# Patient Record
Sex: Female | Born: 1970 | Race: White | Hispanic: No | Marital: Married | Smoking: Never smoker
Health system: Southern US, Community
[De-identification: ages and names within clinical notes are randomized; demographics above are authoritative.]

## PROBLEM LIST (undated history)

## (undated) DIAGNOSIS — F32A Depression, unspecified: Secondary | ICD-10-CM

## (undated) DIAGNOSIS — IMO0001 Reserved for inherently not codable concepts without codable children: Secondary | ICD-10-CM

## (undated) DIAGNOSIS — S92902A Unspecified fracture of left foot, initial encounter for closed fracture: Secondary | ICD-10-CM

## (undated) DIAGNOSIS — F329 Major depressive disorder, single episode, unspecified: Secondary | ICD-10-CM

## (undated) DIAGNOSIS — D219 Benign neoplasm of connective and other soft tissue, unspecified: Secondary | ICD-10-CM

## (undated) DIAGNOSIS — I82409 Acute embolism and thrombosis of unspecified deep veins of unspecified lower extremity: Secondary | ICD-10-CM

## (undated) DIAGNOSIS — Z531 Procedure and treatment not carried out because of patient's decision for reasons of belief and group pressure: Secondary | ICD-10-CM

## (undated) DIAGNOSIS — E041 Nontoxic single thyroid nodule: Secondary | ICD-10-CM

## (undated) DIAGNOSIS — R29898 Other symptoms and signs involving the musculoskeletal system: Secondary | ICD-10-CM

## (undated) DIAGNOSIS — C73 Malignant neoplasm of thyroid gland: Secondary | ICD-10-CM

## (undated) DIAGNOSIS — I739 Peripheral vascular disease, unspecified: Secondary | ICD-10-CM

## (undated) DIAGNOSIS — F419 Anxiety disorder, unspecified: Secondary | ICD-10-CM

## (undated) DIAGNOSIS — I10 Essential (primary) hypertension: Secondary | ICD-10-CM

## (undated) DIAGNOSIS — G43909 Migraine, unspecified, not intractable, without status migrainosus: Secondary | ICD-10-CM

## (undated) DIAGNOSIS — S66919A Strain of unspecified muscle, fascia and tendon at wrist and hand level, unspecified hand, initial encounter: Secondary | ICD-10-CM

## (undated) DIAGNOSIS — D649 Anemia, unspecified: Secondary | ICD-10-CM

## (undated) HISTORY — DX: Anxiety disorder, unspecified: F41.9

## (undated) HISTORY — DX: Anemia, unspecified: D64.9

## (undated) HISTORY — DX: Unspecified fracture of left foot, initial encounter for closed fracture: S92.902A

## (undated) HISTORY — DX: Malignant neoplasm of thyroid gland: C73

---

## 1999-01-16 DIAGNOSIS — I82409 Acute embolism and thrombosis of unspecified deep veins of unspecified lower extremity: Secondary | ICD-10-CM

## 1999-01-16 HISTORY — PX: TUBAL LIGATION: SHX77

## 1999-01-16 HISTORY — PX: THROMBECTOMY: PRO61

## 1999-01-16 HISTORY — DX: Acute embolism and thrombosis of unspecified deep veins of unspecified lower extremity: I82.409

## 1999-03-10 ENCOUNTER — Encounter: Payer: Self-pay | Admitting: *Deleted

## 1999-03-10 ENCOUNTER — Ambulatory Visit (HOSPITAL_COMMUNITY): Admission: RE | Admit: 1999-03-10 | Discharge: 1999-03-10 | Payer: Self-pay | Admitting: *Deleted

## 1999-07-16 ENCOUNTER — Encounter (INDEPENDENT_AMBULATORY_CARE_PROVIDER_SITE_OTHER): Payer: Self-pay

## 1999-07-16 ENCOUNTER — Inpatient Hospital Stay (HOSPITAL_COMMUNITY): Admission: AD | Admit: 1999-07-16 | Discharge: 1999-07-18 | Payer: Self-pay | Admitting: *Deleted

## 2000-06-05 ENCOUNTER — Encounter: Payer: Self-pay | Admitting: Vascular Surgery

## 2000-06-07 ENCOUNTER — Encounter (INDEPENDENT_AMBULATORY_CARE_PROVIDER_SITE_OTHER): Payer: Self-pay | Admitting: *Deleted

## 2000-06-07 ENCOUNTER — Ambulatory Visit (HOSPITAL_COMMUNITY): Admission: RE | Admit: 2000-06-07 | Discharge: 2000-06-07 | Payer: Self-pay | Admitting: Vascular Surgery

## 2002-08-28 ENCOUNTER — Other Ambulatory Visit: Admission: RE | Admit: 2002-08-28 | Discharge: 2002-08-28 | Payer: Self-pay | Admitting: Obstetrics & Gynecology

## 2008-07-11 ENCOUNTER — Emergency Department (HOSPITAL_BASED_OUTPATIENT_CLINIC_OR_DEPARTMENT_OTHER): Admission: EM | Admit: 2008-07-11 | Discharge: 2008-07-11 | Payer: Self-pay | Admitting: Emergency Medicine

## 2010-06-02 NOTE — Op Note (Signed)
[place]. [facility]  Patient:    Kara Hall, Kara Hall                       MRN: [id number] Proc. Date: 06/07/00 Attending:  Di Kindle. Edilia Bo, M.D.                           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  POSTOPERATIVE DIAGNOSIS:  Painful varicose veins of the right lower extremity with incompetence of the right greater saphenous vein.  OPERATION PERFORMED: 1. Ligation and stripping of right greater saphenous vein to the proximal    calf. 2. Segmental excision of varicose veins of the right leg.  SURGEON:  Di Kindle. Edilia Bo, M.D.  ASSISTANT:  Lissa Hoard, P.A.  ANESTHESIA:  General.  DESCRIPTION OF PROCEDURE:  The patient was taken to the operating room and received a general anesthetic.  The right lower extremity and right groin were prepped and draped in the usual sterile fashion.  Through an oblique incision in the right groin, the saphenofemoral junction was dissected free.  The vein at the saphenofemoral junction was ligated.  Small venotomy was made and the vein stripper was passed distally past the incompetent valves down to the proximal leg.  Here the vein was ligated distally and the catheter exited through the vein and then was secured with the 2-0 silk tie.  Next, attention was turned to segmental excision of the varicose veins which had been marked preoperatively.  Using small transverse incisions, the veins were excised bluntly and pressure held for hemostasis.  The one cluster appeared to communicate with the greater saphenous veins, so a second stripper was passed up to the midthigh where the vein here was ligated and both of these veins were then stripped down and pressure held for hemostasis.  The patient had been placed in Trendelenburg.  After hemostasis was obtained, the groin wound was closed with a deep  layer of 3-0 Vicryl and the skin closed with 4-0 Vicryl.  The other incisions were closed with interrupted 4-0 Vicryl sutures. Sterile dressing was applied.  The patient tolerated the procedure well and was transferred to the recovery room in satisfactory condition.  All needle and sponge counts were correct. DD:  06/07/00 TD:  06/08/00 Job: [id number]

## 2010-06-02 NOTE — Op Note (Signed)
[facility]  Patient:    Kara Hall, Kara Hall                     MRN: [id number] Proc. Date: 07/17/99 Adm. Date:  [id number] Attending:  Deniece Ree                           Operative Report  PREOPERATIVE DIAGNOSIS:       Multiparity, desirous of permanent                               sterilization. POSTOPERATIVE DIAGNOSIS:      Multiparity, desirous of permanent                               sterilization.  OPERATION:                    Bilateral tubal ligation using modified                               Pomeroy procedure.  SURGEON:                      Deniece Ree, M.D.  ASSISTANT:  ANESTHESIA:                   Epidural.  ESTIMATED BLOOD LOSS:         Less than 25 cc.  COMPLICATIONS:                None.  DISPOSITION:                  The patient tolerated the procedure well and returned to the recovery room in satisfactory condition.  INDICATIONS:  DESCRIPTION OF PROCEDURE:     The patient was taken to the operating room, prepped and draped in the usual fashion for a postpartum sterilization procedure.  A subumbilical incision was then made.  This was carried down through the fascia and into the abdominal cavity.  With some manipulation the right tube was identified, grasped with a Babcock clamp, followed out until the fimbriated end could be identified.  It was then knuckled up and utilizing 0 plain catgut ligated in a routine fashion without a problem.  This was done likewise on the left side.  Both segments of tube were labeled and sent to pathology.  Both tubal stump areas were then cauterized with the use of cautery. Sponge and needle count was correct times two and hemostasis was present.  The abdominal peritoneum and fascia was then closed using #1 chromic chromic in a running locking stitch followed by subcuticular stitch using 4-0 Vicryl.  The procedure was terminated, the patient tolerated this procedure very well  without any problems and was taken to the recovery room for observation. DD:  07/17/99 TD:  07/18/99 Job: [id number]

## 2011-01-16 DIAGNOSIS — R29898 Other symptoms and signs involving the musculoskeletal system: Secondary | ICD-10-CM

## 2011-01-16 HISTORY — DX: Other symptoms and signs involving the musculoskeletal system: R29.898

## 2013-01-12 ENCOUNTER — Ambulatory Visit: Payer: BC Managed Care – PPO | Admitting: Physician Assistant

## 2013-01-12 VITALS — BP 100/65 | HR 65 | Temp 98.2°F | Resp 18 | Ht 67.5 in | Wt 133.0 lb

## 2013-01-12 DIAGNOSIS — F419 Anxiety disorder, unspecified: Secondary | ICD-10-CM | POA: Insufficient documentation

## 2013-01-12 DIAGNOSIS — J029 Acute pharyngitis, unspecified: Secondary | ICD-10-CM

## 2013-01-12 MED ORDER — AMOXICILLIN 875 MG PO TABS: 875.0000 mg | ORAL_TABLET | Freq: Two times a day (BID) | ORAL | Status: DC

## 2013-01-12 NOTE — Progress Notes (Signed)
Subjective:    Patient ID: Kara Hall, female    DOB: [birth year], 42 y.o.   MRN: [id number]  PCP: No primary provider on file.  Chief Complaint  Patient presents with  . Sore Throat    x 1 week  . Otalgia    bilateral x 5 days   Medications, allergies, past medical history, surgical history, family history, social history and problem list reviewed and updated.  HPI  Has had a flu vaccine this season.  Had strep throat about 2 months ago.  She and her youngest son were both treated at [facility]. She reports that this feels the same.  She also notes that Azithromycin doesn't work as well for her as Amoxicillin and Augmentin.  Ears itch and hurt.  Aleve helps. Nose feels stuffy. Coughing, with phlegm getting stuck in her throat, but she can't cough hard enough to get the phlegm up due to the throat pain.   Subjective fever/chills. No unexplained muscle or joint pain.  Started her period the day after these symptoms began, so she isn't sure if the nausea and diarrhea she's had were worse than usual during her period.  Her son has similar symptoms.  Review of Systems As above.    Objective:   Physical Exam  Vitals reviewed. Constitutional: She is oriented to person, place, and time. Vital signs are normal. She appears well-developed and well-nourished. No distress.  HENT:  Head: Normocephalic and atraumatic.  Right Ear: Hearing, tympanic membrane, external ear and ear canal normal.  Left Ear: Hearing, tympanic membrane, external ear and ear canal normal.  Nose:  No foreign bodies. Right sinus exhibits no maxillary sinus tenderness and no frontal sinus tenderness. Left sinus exhibits no maxillary sinus tenderness and no frontal sinus tenderness.  Mouth/Throat: Uvula is midline, oropharynx is clear and moist and mucous membranes are normal. No uvula swelling. No oropharyngeal exudate.  Eyes: Conjunctivae and EOM are normal. Pupils are equal, round, and  reactive to light. Right eye exhibits no discharge. Left eye exhibits no discharge. No scleral icterus.  Neck: Trachea normal, normal range of motion and full passive range of motion without pain. Neck supple. No mass and no thyromegaly present.  Cardiovascular: Normal rate, regular rhythm and normal heart sounds.   Pulmonary/Chest: Effort normal and breath sounds normal.  Lymphadenopathy:       Head (right side): Tonsillar (tender) adenopathy present. No submandibular, no preauricular, no posterior auricular and no occipital adenopathy present.       Head (left side): Tonsillar (tender) adenopathy present. No submandibular, no preauricular and no occipital adenopathy present.    She has no cervical adenopathy.       Right: No supraclavicular adenopathy present.       Left: No supraclavicular adenopathy present.  Neurological: She is alert and oriented to person, place, and time. She has normal strength. No cranial nerve deficit or sensory deficit.  Skin: Skin is warm, dry and intact. No rash noted.  Psychiatric: She has a normal mood and affect. Her speech is normal and behavior is normal.   No rapid strep collected on her request, but throat culture swab collected and sent.       Assessment & Plan:  1. Acute pharyngitis Suspect viral URI, but cannot exclude Strep pharyngitis - amoxicillin (AMOXIL) 875 MG tablet; Take 1 tablet (875 mg total) by mouth 2 (two) times daily.  Dispense: 20 tablet; Refill: 0 - Culture, Group A Strep  Supportive care.  Anticipatory  guidance.  RTC if symptoms worsen/persist.   Fernande Bras, PA-C Physician Assistant-Certified [facility]

## 2013-01-12 NOTE — Patient Instructions (Signed)
Get plenty of rest and drink at least 64 ounces of water daily.  I will contact you with your lab results as soon as they are available.   If you have not heard from me in 2 weeks, please contact me.  The fastest way to get your results is to register for My Chart (see the instructions on the last page of this printout).   

## 2013-01-15 LAB — CULTURE, GROUP A STREP

## 2013-07-05 ENCOUNTER — Ambulatory Visit (INDEPENDENT_AMBULATORY_CARE_PROVIDER_SITE_OTHER): Payer: BC Managed Care – PPO | Admitting: Emergency Medicine

## 2013-07-05 VITALS — BP 98/70 | HR 78 | Temp 97.3°F | Resp 16 | Ht 66.75 in | Wt 133.0 lb

## 2013-07-05 DIAGNOSIS — J029 Acute pharyngitis, unspecified: Secondary | ICD-10-CM

## 2013-07-05 DIAGNOSIS — E041 Nontoxic single thyroid nodule: Secondary | ICD-10-CM

## 2013-07-05 DIAGNOSIS — J028 Acute pharyngitis due to other specified organisms: Secondary | ICD-10-CM

## 2013-07-05 LAB — POCT RAPID STREP A (OFFICE): Rapid Strep A Screen: NEGATIVE

## 2013-07-05 MED ORDER — AMOXICILLIN 875 MG PO TABS: 875.0000 mg | ORAL_TABLET | Freq: Two times a day (BID) | ORAL | Status: DC

## 2013-07-05 NOTE — Progress Notes (Signed)
Subjective:  This chart was scribed for Kara Queen, MD by Mercy Moore, Medial Scribe. This patient was seen in room 2 and the patient's care was started at 11:10 AM.    Patient ID: Kara Hall, female    DOB: 1970-06-16, 43 y.o.   MRN: [id number]  HPI HPI Comments: Kara Hall is a 43 y.o. female who presents to the [facility] complaining of sore throat, dry cough with associated ear pain, and fever ongoing for one week. Patient reports recurrent history of strep throat infections and feels that her current symptoms are similar. Patient feels that that her symptoms have been exacerbated by the air conditioning at work and working in front of fan placed by one of her coworkers. Patient has been taking ibuprofen 800mg  to manage her pain. Patient desires documentation of her visit for her supervisor.    Patient Active Problem List   Diagnosis Date Noted  . Anxiety 01/12/2013   Past Medical History  Diagnosis Date  . Anemia   . Anxiety    No past surgical history on file. Allergies  Allergen Reactions  . Sulfa Antibiotics    Prior to Admission medications   Medication Sig Start Date End Date Taking? Authorizing Provider  ALPRAZolam [name]) 0.25 MG tablet Take 0.5 mg by mouth at bedtime as needed for anxiety.   Yes Historical Provider, MD  amoxicillin (AMOXIL) 875 MG tablet Take 1 tablet (875 mg total) by mouth 2 (two) times daily. 01/12/13  Yes Chelle S Jeffery, PA-C  zolpidem (AMBIEN) 5 MG tablet Take 5 mg by mouth at bedtime as needed for sleep.   Yes Historical Provider, MD   History   Social History  . Marital Status: Married    Spouse Name: N/A    Number of Children: 3  . Years of Education: N/A   Occupational History  . [employer]   . Cosmetologist    Social History Main Topics  . Smoking status: Never Smoker   . Smokeless tobacco: Never Used  . Alcohol Use: No  . Drug Use: No  . Sexual Activity: Not on file   Other Topics  Concern  . Not on file   Social History Narrative   Lives with her husband and 3 children.       Review of Systems  Constitutional: Positive for fever.  HENT: Positive for ear pain and sore throat.   Respiratory: Positive for cough.   Musculoskeletal: Positive for myalgias.       Objective:   Physical Exam  CONSTITUTIONAL: Well developed/well nourished HEAD: Normocephalic/atraumatic EYES: EOMI/PERRL ENMT: Mucous membranes moist; redness on back of right throat  NECK: supple no meningeal signs; 1 and 1.5 cm right nodule on thyroid  SPINE:entire spine nontender CV: S1/S2 noted, no murmurs/rubs/gallops noted LUNGS: Lungs are clear to auscultation bilaterally, no apparent distress ABDOMEN: soft, nontender, no rebound or guarding GU:no cva tenderness NEURO: Pt is awake/alert, moves all extremitiesx4 EXTREMITIES: pulses normal, full ROM SKIN: warm, color normal PSYCH: no abnormalities of mood noted  Filed Vitals:   07/05/13 1040  BP: 98/70  Pulse: 78  Temp: 97.3 F (36.3 C)  Resp: 16   Results for orders placed in visit on 07/05/13  POCT RAPID STREP A (OFFICE)      Result Value Ref Range   Rapid Strep A Screen Negative  Negative      Assessment & Plan:  Patient has a history  Of strep. Throat culture was done.  Will cover with amoxicillin pending culture results I personally performed the services described in this documentation, which was scribed in my presence. The recorded information has been reviewed and is accurate.

## 2013-07-05 NOTE — Patient Instructions (Signed)

## 2013-07-06 ENCOUNTER — Ambulatory Visit
Admission: RE | Admit: 2013-07-06 | Discharge: 2013-07-06 | Disposition: A | Discharge status: Home / Self Care | Payer: BC Managed Care – PPO | Source: Ambulatory Visit | Attending: Emergency Medicine | Admitting: Emergency Medicine

## 2013-07-06 DIAGNOSIS — E041 Nontoxic single thyroid nodule: Secondary | ICD-10-CM

## 2013-07-07 ENCOUNTER — Other Ambulatory Visit (HOSPITAL_COMMUNITY)
Admission: RE | Admit: 2013-07-07 | Discharge: 2013-07-07 | Disposition: A | Discharge status: Home / Self Care | Payer: BC Managed Care – PPO | Source: Ambulatory Visit | Attending: Interventional Radiology | Admitting: Interventional Radiology

## 2013-07-07 ENCOUNTER — Ambulatory Visit
Admission: RE | Admit: 2013-07-07 | Discharge: 2013-07-07 | Disposition: A | Discharge status: Home / Self Care | Payer: BC Managed Care – PPO | Source: Ambulatory Visit | Attending: Emergency Medicine | Admitting: Emergency Medicine

## 2013-07-07 ENCOUNTER — Other Ambulatory Visit: Payer: Self-pay | Admitting: Radiology

## 2013-07-07 DIAGNOSIS — E041 Nontoxic single thyroid nodule: Secondary | ICD-10-CM | POA: Insufficient documentation

## 2013-07-07 LAB — CULTURE, GROUP A STREP: Organism ID, Bacteria: NORMAL

## 2013-07-09 ENCOUNTER — Other Ambulatory Visit: Payer: Self-pay | Admitting: Emergency Medicine

## 2013-07-09 DIAGNOSIS — D497 Neoplasm of unspecified behavior of endocrine glands and other parts of nervous system: Secondary | ICD-10-CM

## 2013-07-11 ENCOUNTER — Telehealth: Payer: Self-pay | Admitting: Family Medicine

## 2013-07-11 NOTE — Telephone Encounter (Signed)
Spoke with patient --- s/p thyroid ultrasound and biopsy.  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  Scheduled to work next week; referred to general surgery on 07/09/13.  Curious of when appointment will be with general surgery. Advised patient of appointment with general surgery 07/28/13 at 4:00pm.  To Dr. Lonia Mad.

## 2013-07-12 ENCOUNTER — Other Ambulatory Visit: Payer: Self-pay | Admitting: Emergency Medicine

## 2013-07-12 DIAGNOSIS — D497 Neoplasm of unspecified behavior of endocrine glands and other parts of nervous system: Secondary | ICD-10-CM

## 2013-07-12 NOTE — Telephone Encounter (Signed)
Thank you for taking this call Kara Hall. She called me again last night to discuss her situation. I think she is just afraid. She wants me to cancel her appointment with Gen. surgery and make it with ENT which I will do. Thank you again for

## 2013-07-28 ENCOUNTER — Ambulatory Visit (INDEPENDENT_AMBULATORY_CARE_PROVIDER_SITE_OTHER): Payer: Self-pay | Admitting: Surgery

## 2013-08-09 ENCOUNTER — Ambulatory Visit (INDEPENDENT_AMBULATORY_CARE_PROVIDER_SITE_OTHER): Payer: BC Managed Care – PPO | Admitting: Physician Assistant

## 2013-08-09 VITALS — BP 110/82 | HR 58 | Temp 97.7°F | Resp 15 | Ht 66.5 in | Wt 135.6 lb

## 2013-08-09 DIAGNOSIS — L739 Follicular disorder, unspecified: Secondary | ICD-10-CM

## 2013-08-09 DIAGNOSIS — L678 Other hair color and hair shaft abnormalities: Secondary | ICD-10-CM

## 2013-08-09 DIAGNOSIS — Z85850 Personal history of malignant neoplasm of thyroid: Secondary | ICD-10-CM | POA: Insufficient documentation

## 2013-08-09 DIAGNOSIS — G4700 Insomnia, unspecified: Secondary | ICD-10-CM

## 2013-08-09 DIAGNOSIS — L738 Other specified follicular disorders: Secondary | ICD-10-CM

## 2013-08-09 MED ORDER — DOXYCYCLINE HYCLATE 100 MG PO CAPS: 100.0000 mg | ORAL_CAPSULE | Freq: Two times a day (BID) | ORAL | Status: DC

## 2013-08-09 MED ORDER — ZOLPIDEM TARTRATE 5 MG PO TABS: 5.0000 mg | ORAL_TABLET | Freq: Every evening | ORAL | Status: DC | PRN

## 2013-08-09 MED ORDER — FLUCONAZOLE 150 MG PO TABS: 150.0000 mg | ORAL_TABLET | Freq: Once | ORAL | Status: DC

## 2013-08-09 NOTE — Patient Instructions (Signed)
Stay as cool and dry as you can.  Wear loose fitting clothing to prevent increased irritation.

## 2013-08-09 NOTE — Progress Notes (Signed)
   Subjective:    Patient ID: Kara Hall, female    DOB: June 29, 1970, 43 y.o.   MRN: [id number]   PCP: Luetta Nutting, DO  Chief Complaint  Patient presents with  . Rash    since yesterday, possible stap, right inner thigh    Medications, allergies, past medical history, surgical history, family history, social history and problem list reviewed and updated.  Patient Active Problem List   Diagnosis Date Noted  . Thyroid nodule 08/09/2013  . Anxiety 01/12/2013    Prior to Admission medications   Medication Sig Start Date End Date Taking? Authorizing Provider  ALPRAZolam [name]) 0.25 MG tablet Take 0.5 mg by mouth at bedtime as needed for anxiety.   Yes Historical Provider, MD    HPI  Presents with small red bumps on the mons pubis.  She is concerned that she has "Staph" or MRSA due to her husband recently being treated for a staph skin infection.  No lesions anywhere else.  No pain, a little itchy.  No swollen nodes. No urinary symptoms.  She asks for a refill of Ambien to help her sleep.  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  Partial thyroidectomy is scheduled for 08/28/2013.  Review of Systems     Objective:   Physical Exam  Constitutional: She is oriented to person, place, and time. She appears well-developed and well-nourished. She is active and cooperative. No distress.  BP 110/82  Pulse 58  Temp(Src) 97.7 F (36.5 C) (Oral)  Resp 15  Ht 5' 6.5" (1.689 m)  Wt 135 lb 9.6 oz (61.508 kg)  BMI 21.56 kg/m2  SpO2 100%  LMP 08/06/2013 Daughter is present.  Eyes: Conjunctivae are normal. No scleral icterus.  Cardiovascular: Normal rate.   Pulmonary/Chest: Effort normal.  Neurological: She is alert and oriented to person, place, and time.  Skin: Skin is warm and dry. Rash noted. Rash is papular and pustular.  Papulopustular lesions scattered on the mons pubis.  Lesions are tiny.  No  lymphadenopathy. No induration.  Psychiatric: Her speech is normal and behavior is normal. Her mood appears anxious. Her affect is not inappropriate. She does not exhibit a depressed mood.          Assessment & Plan:  1. Folliculitis Supportive care. Anticipatory guidance. RTC if symptoms worsen/persist. - doxycycline (VIBRAMYCIN) 100 MG capsule; Take 1 capsule (100 mg total) by mouth 2 (two) times daily.  Dispense: 20 capsule; Refill: 0 - fluconazole (DIFLUCAN) 150 MG tablet; Take 1 tablet (150 mg total) by mouth once. Repeat if needed  Dispense: 2 tablet; Refill: 0  2. Insomnia - zolpidem (AMBIEN) 5 MG tablet; Take 1 tablet (5 mg total) by mouth at bedtime as needed for sleep.  Dispense: 30 tablet; Refill: 0   Fara Chute, PA-C Physician Assistant-Certified [facility]

## 2013-08-14 ENCOUNTER — Encounter (HOSPITAL_COMMUNITY): Payer: Self-pay | Admitting: Pharmacy Technician

## 2013-08-19 ENCOUNTER — Other Ambulatory Visit: Payer: Self-pay | Admitting: Otolaryngology

## 2013-08-21 ENCOUNTER — Encounter (HOSPITAL_COMMUNITY): Payer: Self-pay

## 2013-08-21 ENCOUNTER — Encounter (HOSPITAL_COMMUNITY)
Admission: RE | Admit: 2013-08-21 | Discharge: 2013-08-21 | Disposition: A | Discharge status: Home / Self Care | Payer: BC Managed Care – PPO | Source: Ambulatory Visit | Attending: Otolaryngology | Admitting: Otolaryngology

## 2013-08-21 ENCOUNTER — Ambulatory Visit (HOSPITAL_COMMUNITY)
Admission: RE | Admit: 2013-08-21 | Discharge: 2013-08-21 | Disposition: A | Discharge status: Home / Self Care | Payer: BC Managed Care – PPO | Source: Ambulatory Visit | Attending: Otolaryngology | Admitting: Otolaryngology

## 2013-08-21 DIAGNOSIS — Z01818 Encounter for other preprocedural examination: Secondary | ICD-10-CM | POA: Diagnosis present

## 2013-08-21 HISTORY — DX: Depression, unspecified: F32.A

## 2013-08-21 HISTORY — DX: Peripheral vascular disease, unspecified: I73.9

## 2013-08-21 HISTORY — DX: Other symptoms and signs involving the musculoskeletal system: R29.898

## 2013-08-21 HISTORY — DX: Major depressive disorder, single episode, unspecified: F32.9

## 2013-08-21 HISTORY — DX: Strain of unspecified muscle, fascia and tendon at wrist and hand level, unspecified hand, initial encounter: S66.919A

## 2013-08-21 LAB — COMPREHENSIVE METABOLIC PANEL
ALT: 10 U/L (ref 0–35)
AST: 15 U/L (ref 0–37)
Albumin: 3.7 g/dL (ref 3.5–5.2)
Alkaline Phosphatase: 50 U/L (ref 39–117)
Anion gap: 12 (ref 5–15)
BILIRUBIN TOTAL: 0.6 mg/dL (ref 0.3–1.2)
BUN: 13 mg/dL (ref 6–23)
CHLORIDE: 103 meq/L (ref 96–112)
CO2: 23 mEq/L (ref 19–32)
Calcium: 8.3 mg/dL — ABNORMAL LOW (ref 8.4–10.5)
Creatinine, Ser: 0.52 mg/dL (ref 0.50–1.10)
GFR calc Af Amer: >90 mL/min (ref >90)
GFR calc non Af Amer: >90 mL/min (ref >90)
Glucose, Bld: 82 mg/dL (ref 70–99)
POTASSIUM: 3.4 meq/L — AB (ref 3.7–5.3)
Sodium: 138 mEq/L (ref 137–147)
Total Protein: 6.9 g/dL (ref 6.0–8.3)

## 2013-08-21 LAB — HCG, SERUM, QUALITATIVE: PREG SERUM: NEGATIVE

## 2013-08-21 LAB — CBC
HCT: 35.5 % — ABNORMAL LOW (ref 36.0–46.0)
Hemoglobin: 11.4 g/dL — ABNORMAL LOW (ref 12.0–15.0)
MCH: 27.5 pg (ref 26.0–34.0)
MCHC: 32.1 g/dL (ref 30.0–36.0)
MCV: 85.7 fL (ref 78.0–100.0)
Platelets: 228 10*3/uL (ref 150–400)
RBC: 4.14 MIL/uL (ref 3.87–5.11)
RDW: 14.1 % (ref 11.5–15.5)
WBC: 5.9 10*3/uL (ref 4.0–10.5)

## 2013-08-21 LAB — NO BLOOD PRODUCTS

## 2013-08-21 NOTE — Pre-Procedure Instructions (Signed)
Kara Hall  08/21/2013   Your procedure is scheduled on:  08/28/2013  Report to [facility] Admitting  ENTRANCE  A  at    6:30A.M.  Call this number if you have problems the morning of surgery: [phone]   Remember:   Do not eat food or drink liquids after midnight.         on Thursday evening    Take these medicines the morning of surgery with A SIP OF WATER:   XANAX   Do not wear jewelry, make-up or nail polish.   Do not wear lotions, powders, or perfumes. You may wear deodorant.   Do not shave 48 hours prior to surgery.    Do not bring valuables to the hospital.  [facility] is not responsible  for any belongings or valuables.               Contacts, dentures or bridgework may not be worn into surgery.   Leave suitcase in the car. After surgery it may be brought to your room.   For patients admitted to the hospital, discharge time is determined by your                treatment team.               Patients discharged the day of surgery will not be allowed to drive  home.  Name and phone number of your driver: /w spouse   Special Instructions: Special Instructions: [place] - Preparing for Surgery  Before surgery, you can play an important role.  Because skin is not sterile, your skin needs to be as free of germs as possible.  You can reduce the number of germs on you skin by washing with CHG (chlorahexidine gluconate) soap before surgery.  CHG is an antiseptic cleaner which kills germs and bonds with the skin to continue killing germs even after washing.  Please DO NOT use if you have an allergy to CHG or antibacterial soaps.  If your skin becomes reddened/irritated stop using the CHG and inform your nurse when you arrive at Short Stay.  Do not shave (including legs and underarms) for at least 48 hours prior to the first CHG shower.  You may shave your face.  Please follow these instructions carefully:   1.  Shower with CHG Soap the night before surgery  and the  morning of Surgery.  2.  If you choose to wash your hair, wash your hair first as usual with your  normal shampoo.  3.  After you shampoo, rinse your hair and body thoroughly to remove the  Shampoo.  4.  Use CHG as you would any other liquid soap.  You can apply chg directly to the skin and wash gently with scrungie or a clean washcloth.  5.  Apply the CHG Soap to your body ONLY FROM THE NECK DOWN.    Do not use on open wounds or open sores.  Avoid contact with your eyes, ears, mouth and genitals (private parts).  Wash genitals (private parts)   with your normal soap.  6.  Wash thoroughly, paying special attention to the area where your surgery will be performed.  7.  Thoroughly rinse your body with warm water from the neck down.  8.  DO NOT shower/wash with your normal soap after using and rinsing off   the CHG Soap.  9.  Pat yourself dry with a clean towel.  10.  Wear clean pajamas.            11.  Place clean sheets on your bed the night of your first shower and do not sleep with pets.  Day of Surgery  Do not apply any lotions/deodorants the morning of surgery.  Please wear clean clothes to the hospital/surgery center.   Please read over the following fact sheets that you were given: Pain Booklet, Coughing and Deep Breathing and Surgical Site Infection Prevention

## 2013-08-27 NOTE — H&P (Signed)
Mysty, Kielty 43 y.o., female [id number]     Chief Complaint: RIGHT thyroid mass  HPI: Patient is an anxious 43 year old female who presents for thyroid nodules. About two weeks ago she saw Dr. Everlene Farrier for sore throat. She took a round of Amoxicillin to cover for possible strep and the throat is feeling better. On exam a 1-1.5cm right thyroid nodule was found. Patient was not aware of a nodule in her thyroid gland. She states her mother has multinodulear goiter. Ultrasound of thyroid on 6/22 which showed the right lobe with several nodules including a 47mm cyst in the superior pole, 15x8x66mm solid nodule in the mid lobe and an adjacent 11x6x63mm hypoechoic nodule and a poorly marginated 69mm nodule, inferior pole. Left lobe with 22mm complex cystic lesion in the mid lobe and 68mm hypoechoic solid nodule in the inferior pole (per report). On 6/23 she had an U/S-guided FNA of the dominant right nodule (four passes).  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 Follicular epithelium in a predominantly microfollicular pattern (per report).  Previous surgeries include DVT on her right lower leg requiring surgery; she can barely tell there is a scar there. No history of keloid formation.  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 She is a non-smoker.  One month recheck.  She remains quite anxious.  We went through the decision tree regarding partial versus total thyroidectomy and possible postoperative radioactive iodine.  If she has a total thyroidectomy, she will need thyroid replacement, of course.   I discussed the surgery in detail including risks and complications.  Questions were answered and informed consent was obtained.  I discussed advancement of diet and activity including return to work.  Because her work in the [employer] store is quite physically strenuous, she thinks she may need more than 2 weeks off.  I suspect she will be ready to return around 2 weeks  but we will be seeing her before that and will discuss this.   Postoperative prescriptions for tramadol, hydrocodone, and Synthroid written and given  PMH: Past Medical History  Diagnosis Date  . Anemia   . Thyroid disease     nodule surgery scheduled for 08/28/2013  . Peripheral vascular disease     DVT- per pt. in 2001- had vein removed.   . Anxiety     panic attack, also reports problems with depression, relative to her job & upcoming surgery , relative to life problems - with children & spouse    . Headache(784.0)     h/o migraines - as a teenager  & again now with family issues, states she has to lay down & rest in a dark room   . Depression   . Muscle strain of hand     wearing home made splint today, L hand  . Back complaints 2013    has had steroid injection     Surg Hx: Past Surgical History  Procedure Laterality Date  . Tubal ligation  2001  . Vein surgery Right     for deep vein thrombosis    FHx:   Family History  Problem Relation Age of Onset  . Heart disease Mother   . Hyperlipidemia Mother   . Heart disease Father   . Hyperlipidemia Father   . Heart disease Brother   . Hyperlipidemia Brother   . Heart disease Brother   . Hyperlipidemia Brother    SocHx:  reports that she has never smoked.  She has never used smokeless tobacco. She reports that she does not drink alcohol or use illicit drugs.  ALLERGIES:  Allergies  Allergen Reactions  . Sulfa Antibiotics Other (See Comments)    Severe bladder, and kidney infection  . Trazodone And Nefazodone Other (See Comments)    Headache every am    No prescriptions prior to admission    No results found for this or any previous visit (from the past 48 hour(s)). No results found.  YCX:KGYJEHUD: Feeling tired (fatigue).  No fever, no night sweats, and no recent weight loss. Head: Headache. Eyes: No eye symptoms. Otolaryngeal: No hearing loss.  Earache.  No tinnitus  and no purulent nasal discharge.  No  nasal passage blockage (stuffiness), no snoring, and no sneezing.  Hoarseness  and sore throat. Cardiovascular: No chest pain or discomfort  and no palpitations. Pulmonary: No dyspnea.  Cough.  No wheezing. Gastrointestinal: No dysphagia  and no heartburn.  No nausea, no abdominal pain, and no melena.  No diarrhea. Genitourinary: No dysuria. Endocrine: No muscle weakness. Musculoskeletal: No calf muscle cramps, no arthralgias, and no soft tissue swelling. Neurological: Dizziness.  No fainting, no tingling, and no numbness. Psychological: Anxiety.  No depression. Skin: No rash.  BP:98/70,  Height: 5 ft 6 in, Weight: 135 lb , BMI: 21.8 kg/m2  PHYSICAL EXAM: She is thin and appears healthy.  She is quite anxious.  Mental status is basically intact.  Ears are clear.  Anterior nose is clear.  Oral cavity is moist with teeth in good repair.  Oropharynx clear.  Hypopharynx by mirror examination shows mobile vocal cords.  Neck without adenopathy.  Slight fullness in the RIGHT thyroid lobe.  Negative Chvostek's test on both sides.     Lungs: Clear to auscultation Heart: Regular rate and rhythm without murmur Abdomen: Soft, active Extremities: Normal configuration.  She has a brace on her LEFT hand. Neurologic: Symmetric, grossly intact.     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  We are going to take out half of her thyroid gland next week to make sure it does not have cancer in it.  If the pathologist can identify cancer at the time of surgery, we will also remove the second half of your thyroid gland.  In that case, he will need thyroid replacement medication, and later, radioactive iodine therapy.  We will get you set up with an endocrinologist if appropriate.   If the tumor is benign, then you do not need thyroid medication.  Either Tramadol, or Hydrocodone for pain relief.  Plain Ibuprofen would be fine too.  No strenuous activity x 2 weeks.  Recheck  here 10 days for suture removal.  TraMADol HCl - 50 MG Oral Tablet;1-2 po q4h prn pain; Qty40; R0; Rx. Hydrocodone-Acetaminophen 5-325 MG Oral Tablet;1-2 po q4-6h prn pain; Qty40; R0; Rx. Levothyroxine Sodium 125 MCG Oral Tablet;TAKE 1 TABLET DAILY; JSH70; R2; Rx  Jodi Marble 2/63/7858, 6:21 PM

## 2013-08-28 ENCOUNTER — Observation Stay (HOSPITAL_COMMUNITY)
Admission: RE | Admit: 2013-08-28 | Discharge: 2013-08-29 | Disposition: A | Discharge status: Home / Self Care | Payer: BC Managed Care – PPO | Source: Ambulatory Visit | Attending: Otolaryngology | Admitting: Otolaryngology

## 2013-08-28 ENCOUNTER — Encounter (HOSPITAL_COMMUNITY)
Admission: RE | Disposition: A | Discharge status: Home / Self Care | Payer: Self-pay | Source: Ambulatory Visit | Attending: Otolaryngology

## 2013-08-28 ENCOUNTER — Ambulatory Visit (HOSPITAL_COMMUNITY): Payer: BC Managed Care – PPO | Admitting: Anesthesiology

## 2013-08-28 ENCOUNTER — Encounter (HOSPITAL_COMMUNITY): Payer: Self-pay | Admitting: *Deleted

## 2013-08-28 ENCOUNTER — Encounter (HOSPITAL_COMMUNITY): Payer: BC Managed Care – PPO | Admitting: Anesthesiology

## 2013-08-28 DIAGNOSIS — Z882 Allergy status to sulfonamides status: Secondary | ICD-10-CM | POA: Insufficient documentation

## 2013-08-28 DIAGNOSIS — F3289 Other specified depressive episodes: Secondary | ICD-10-CM | POA: Diagnosis not present

## 2013-08-28 DIAGNOSIS — E041 Nontoxic single thyroid nodule: Secondary | ICD-10-CM

## 2013-08-28 DIAGNOSIS — Z86718 Personal history of other venous thrombosis and embolism: Secondary | ICD-10-CM | POA: Diagnosis not present

## 2013-08-28 DIAGNOSIS — Z888 Allergy status to other drugs, medicaments and biological substances status: Secondary | ICD-10-CM | POA: Insufficient documentation

## 2013-08-28 DIAGNOSIS — C73 Malignant neoplasm of thyroid gland: Secondary | ICD-10-CM | POA: Diagnosis not present

## 2013-08-28 DIAGNOSIS — F329 Major depressive disorder, single episode, unspecified: Secondary | ICD-10-CM | POA: Insufficient documentation

## 2013-08-28 DIAGNOSIS — G43909 Migraine, unspecified, not intractable, without status migrainosus: Secondary | ICD-10-CM | POA: Diagnosis not present

## 2013-08-28 DIAGNOSIS — F410 Panic disorder [episodic paroxysmal anxiety] without agoraphobia: Secondary | ICD-10-CM | POA: Insufficient documentation

## 2013-08-28 DIAGNOSIS — E0789 Other specified disorders of thyroid: Secondary | ICD-10-CM | POA: Diagnosis present

## 2013-08-28 HISTORY — DX: Nontoxic single thyroid nodule: E04.1

## 2013-08-28 HISTORY — DX: Procedure and treatment not carried out because of patient's decision for reasons of belief and group pressure: Z53.1

## 2013-08-28 HISTORY — PX: THYROIDECTOMY: SHX17

## 2013-08-28 HISTORY — DX: Reserved for inherently not codable concepts without codable children: IMO0001

## 2013-08-28 HISTORY — DX: Migraine, unspecified, not intractable, without status migrainosus: G43.909

## 2013-08-28 HISTORY — DX: Acute embolism and thrombosis of unspecified deep veins of unspecified lower extremity: I82.409

## 2013-08-28 LAB — CBC
HCT: 33.9 % — ABNORMAL LOW (ref 36.0–46.0)
HEMOGLOBIN: 10.7 g/dL — AB (ref 12.0–15.0)
MCH: 26.9 pg (ref 26.0–34.0)
MCHC: 31.6 g/dL (ref 30.0–36.0)
MCV: 85.2 fL (ref 78.0–100.0)
Platelets: 200 10*3/uL (ref 150–400)
RBC: 3.98 MIL/uL (ref 3.87–5.11)
RDW: 14 % (ref 11.5–15.5)
WBC: 10.3 10*3/uL (ref 4.0–10.5)

## 2013-08-28 LAB — CREATININE, SERUM
Creatinine, Ser: 0.53 mg/dL (ref 0.50–1.10)
GFR calc Af Amer: >90 mL/min (ref >90)
GFR calc non Af Amer: >90 mL/min (ref >90)

## 2013-08-28 SURGERY — THYROIDECTOMY
Anesthesia: General | Site: Neck | Laterality: Right

## 2013-08-28 MED ORDER — BACITRACIN ZINC 500 UNIT/GM EX OINT
TOPICAL_OINTMENT | CUTANEOUS | Status: AC
  Filled 2013-08-28: qty 15

## 2013-08-28 MED ORDER — ROCURONIUM BROMIDE 100 MG/10ML IV SOLN
INTRAVENOUS | Status: DC | PRN
  Administered 2013-08-28: 40 mg via INTRAVENOUS

## 2013-08-28 MED ORDER — LACTATED RINGERS IV SOLN
INTRAVENOUS | Status: DC
  Administered 2013-08-28: 07:00:00 via INTRAVENOUS

## 2013-08-28 MED ORDER — METOCLOPRAMIDE HCL 5 MG/ML IJ SOLN
INTRAMUSCULAR | Status: AC
  Filled 2013-08-28: qty 2

## 2013-08-28 MED ORDER — DIPHENHYDRAMINE HCL 50 MG/ML IJ SOLN
INTRAMUSCULAR | Status: AC
  Filled 2013-08-28: qty 1

## 2013-08-28 MED ORDER — LIDOCAINE-EPINEPHRINE 1 %-1:100000 IJ SOLN
INTRAMUSCULAR | Status: AC
  Filled 2013-08-28: qty 1

## 2013-08-28 MED ORDER — 0.9 % SODIUM CHLORIDE (POUR BTL) OPTIME
TOPICAL | Status: DC | PRN
  Administered 2013-08-28: 1000 mL

## 2013-08-28 MED ORDER — LIDOCAINE HCL (CARDIAC) 20 MG/ML IV SOLN
INTRAVENOUS | Status: AC
  Filled 2013-08-28: qty 10

## 2013-08-28 MED ORDER — DOXYCYCLINE HYCLATE 100 MG PO TABS
100.0000 mg | ORAL_TABLET | Freq: Two times a day (BID) | ORAL | Status: DC
  Filled 2013-08-28 (×3): qty 1

## 2013-08-28 MED ORDER — ONDANSETRON HCL 4 MG/2ML IJ SOLN
INTRAMUSCULAR | Status: DC | PRN
  Administered 2013-08-28: 4 mg via INTRAVENOUS

## 2013-08-28 MED ORDER — METOCLOPRAMIDE HCL 5 MG/ML IJ SOLN
INTRAMUSCULAR | Status: DC | PRN
  Administered 2013-08-28: 10 mg via INTRAVENOUS

## 2013-08-28 MED ORDER — CHLORHEXIDINE GLUCONATE 4 % EX LIQD
1.0000 "application " | Freq: Once | CUTANEOUS | Status: DC
  Filled 2013-08-28: qty 15

## 2013-08-28 MED ORDER — HYDROCODONE-ACETAMINOPHEN 5-325 MG PO TABS
1.0000 | ORAL_TABLET | ORAL | Status: DC | PRN
  Administered 2013-08-28 – 2013-08-29 (×4): 2 via ORAL
  Administered 2013-08-29: 1 via ORAL
  Filled 2013-08-28 (×4): qty 2
  Filled 2013-08-28: qty 1

## 2013-08-28 MED ORDER — FENTANYL CITRATE 0.05 MG/ML IJ SOLN
INTRAMUSCULAR | Status: AC
  Filled 2013-08-28: qty 2

## 2013-08-28 MED ORDER — EPHEDRINE SULFATE 50 MG/ML IJ SOLN
INTRAMUSCULAR | Status: DC | PRN
  Administered 2013-08-28 (×5): 5 mg via INTRAVENOUS

## 2013-08-28 MED ORDER — ZOLPIDEM TARTRATE 5 MG PO TABS: 5.0000 mg | ORAL_TABLET | Freq: Every evening | ORAL | Status: DC | PRN

## 2013-08-28 MED ORDER — PROMETHAZINE HCL 25 MG/ML IJ SOLN
6.2500 mg | INTRAMUSCULAR | Status: DC | PRN
Start: 2013-08-28 — End: 2013-08-28

## 2013-08-28 MED ORDER — DIPHENHYDRAMINE HCL 50 MG/ML IJ SOLN: 6.2500 mg | Freq: Once | INTRAMUSCULAR | Status: DC

## 2013-08-28 MED ORDER — LIDOCAINE-EPINEPHRINE 1 %-1:100000 IJ SOLN
INTRAMUSCULAR | Status: DC | PRN
  Administered 2013-08-28: 20 mL

## 2013-08-28 MED ORDER — DEXAMETHASONE SODIUM PHOSPHATE 10 MG/ML IJ SOLN
INTRAMUSCULAR | Status: AC
  Filled 2013-08-28: qty 1

## 2013-08-28 MED ORDER — DEXTROSE-NACL 5-0.45 % IV SOLN
INTRAVENOUS | Status: DC
Start: 2013-08-28 — End: 2013-08-29
  Administered 2013-08-28 – 2013-08-29 (×2): via INTRAVENOUS

## 2013-08-28 MED ORDER — NEOSTIGMINE METHYLSULFATE 10 MG/10ML IV SOLN
INTRAVENOUS | Status: DC | PRN
  Administered 2013-08-28: 3 mg via INTRAVENOUS

## 2013-08-28 MED ORDER — ALPRAZOLAM 0.5 MG PO TABS
0.5000 mg | ORAL_TABLET | Freq: Three times a day (TID) | ORAL | Status: DC | PRN
  Administered 2013-08-28 – 2013-08-29 (×2): 0.5 mg via ORAL
  Filled 2013-08-28 (×2): qty 1

## 2013-08-28 MED ORDER — GLYCOPYRROLATE 0.2 MG/ML IJ SOLN
INTRAMUSCULAR | Status: DC | PRN
  Administered 2013-08-28: 0.4 mg via INTRAVENOUS

## 2013-08-28 MED ORDER — PROPOFOL 10 MG/ML IV BOLUS
INTRAVENOUS | Status: DC | PRN
  Administered 2013-08-28: 150 mg via INTRAVENOUS

## 2013-08-28 MED ORDER — BACITRACIN ZINC 500 UNIT/GM EX OINT
1.0000 "application " | TOPICAL_OINTMENT | Freq: Three times a day (TID) | CUTANEOUS | Status: DC
  Administered 2013-08-28 – 2013-08-29 (×3): 1 via TOPICAL
  Filled 2013-08-28: qty 15
  Filled 2013-08-28: qty 28.35
  Filled 2013-08-28: qty 15

## 2013-08-28 MED ORDER — HEPARIN SODIUM (PORCINE) 5000 UNIT/ML IJ SOLN
5000.0000 [IU] | Freq: Three times a day (TID) | INTRAMUSCULAR | Status: DC
  Administered 2013-08-29: 5000 [IU] via SUBCUTANEOUS
  Filled 2013-08-28 (×4): qty 1

## 2013-08-28 MED ORDER — ONDANSETRON HCL 4 MG/2ML IJ SOLN
INTRAMUSCULAR | Status: AC
  Filled 2013-08-28: qty 2

## 2013-08-28 MED ORDER — ALPRAZOLAM 0.5 MG PO TABS: 0.5000 mg | ORAL_TABLET | Freq: Three times a day (TID) | ORAL | Status: DC | PRN

## 2013-08-28 MED ORDER — LIDOCAINE-EPINEPHRINE 2 %-1:100000 IJ SOLN
INTRAMUSCULAR | Status: AC
  Filled 2013-08-28: qty 1

## 2013-08-28 MED ORDER — ROCURONIUM BROMIDE 50 MG/5ML IV SOLN
INTRAVENOUS | Status: AC
  Filled 2013-08-28: qty 1

## 2013-08-28 MED ORDER — FENTANYL CITRATE 0.05 MG/ML IJ SOLN
INTRAMUSCULAR | Status: AC
  Filled 2013-08-28: qty 5

## 2013-08-28 MED ORDER — FENTANYL CITRATE 0.05 MG/ML IJ SOLN
INTRAMUSCULAR | Status: AC
Start: 2013-08-28 — End: 2013-08-28
  Administered 2013-08-28: 50 ug via INTRAVENOUS
  Filled 2013-08-28: qty 2

## 2013-08-28 MED ORDER — LIDOCAINE HCL (CARDIAC) 20 MG/ML IV SOLN
INTRAVENOUS | Status: DC | PRN
  Administered 2013-08-28: 40 mg via INTRAVENOUS

## 2013-08-28 MED ORDER — MEPERIDINE HCL 25 MG/ML IJ SOLN: 6.2500 mg | INTRAMUSCULAR | Status: DC | PRN

## 2013-08-28 MED ORDER — PROPOFOL 10 MG/ML IV BOLUS
INTRAVENOUS | Status: AC
  Filled 2013-08-28: qty 20

## 2013-08-28 MED ORDER — SCOPOLAMINE 1 MG/3DAYS TD PT72SCOPOLAMINE 1 MG/3DAYS
1.0000 | MEDICATED_PATCH | TRANSDERMAL | Status: DC
Start: 2013-08-28 — End: 2013-08-28

## 2013-08-28 MED ORDER — FENTANYL CITRATE 0.05 MG/ML IJ SOLN
25.0000 ug | INTRAMUSCULAR | Status: DC | PRN
  Administered 2013-08-28 (×3): 50 ug via INTRAVENOUS

## 2013-08-28 MED ORDER — DEXAMETHASONE SODIUM PHOSPHATE 10 MG/ML IJ SOLN
INTRAMUSCULAR | Status: DC | PRN
  Administered 2013-08-28: 10 mg via INTRAVENOUS

## 2013-08-28 MED ORDER — ONDANSETRON HCL 4 MG/2ML IJ SOLN
4.0000 mg | INTRAMUSCULAR | Status: DC | PRN
Start: 2013-08-28 — End: 2013-08-29
  Administered 2013-08-29: 4 mg via INTRAVENOUS
  Filled 2013-08-28: qty 2

## 2013-08-28 MED ORDER — ONDANSETRON HCL 4 MG PO TABS: 4.0000 mg | ORAL_TABLET | ORAL | Status: DC | PRN

## 2013-08-28 MED ORDER — LACTATED RINGERS IV SOLN
INTRAVENOUS | Status: DC | PRN
  Administered 2013-08-28 (×2): via INTRAVENOUS

## 2013-08-28 MED ORDER — GLYCOPYRROLATE 0.2 MG/ML IJ SOLN
INTRAMUSCULAR | Status: AC
  Filled 2013-08-28: qty 2

## 2013-08-28 MED ORDER — ALPRAZOLAM 0.5 MG PO TABS: 0.5000 mg | ORAL_TABLET | Freq: Every evening | ORAL | Status: DC | PRN

## 2013-08-28 MED ORDER — NEOSTIGMINE METHYLSULFATE 10 MG/10ML IV SOLN
INTRAVENOUS | Status: AC
  Filled 2013-08-28: qty 3

## 2013-08-28 MED ORDER — LIDOCAINE HCL 4 % MT SOLN
OROMUCOSAL | Status: DC | PRN
  Administered 2013-08-28: 4 mL via TOPICAL

## 2013-08-28 MED ORDER — MIDAZOLAM HCL 2 MG/2ML IJ SOLN
INTRAMUSCULAR | Status: AC
  Filled 2013-08-28: qty 2

## 2013-08-28 MED ORDER — MIDAZOLAM HCL 5 MG/5ML IJ SOLN
INTRAMUSCULAR | Status: DC | PRN
  Administered 2013-08-28: 2 mg via INTRAVENOUS

## 2013-08-28 MED ORDER — FENTANYL CITRATE 0.05 MG/ML IJ SOLN
INTRAMUSCULAR | Status: DC | PRN
  Administered 2013-08-28 (×6): 50 ug via INTRAVENOUS

## 2013-08-28 MED ORDER — DIPHENHYDRAMINE HCL 50 MG/ML IJ SOLN
INTRAMUSCULAR | Status: DC | PRN
  Administered 2013-08-28: 6.25 mg via INTRAVENOUS

## 2013-08-28 SURGICAL SUPPLY — 44 items
APPLIER CLIP 9.375 SM OPEN (CLIP) ×8
ATTRACTOMAT 16X20 MAGNETIC DRP (DRAPES) ×2 IMPLANT
BLADE SURG 10 STRL SS (BLADE) ×2 IMPLANT
BLADE SURG 15 STRL LF DISP TIS (BLADE) ×1 IMPLANT
BLADE SURG 15 STRL SS (BLADE) ×1
CANISTER SUCTION 2500CC (MISCELLANEOUS) ×2 IMPLANT
CLEANER TIP ELECTROSURG 2X2 (MISCELLANEOUS) ×2 IMPLANT
CLIP APPLIE 9.375 SM OPEN (CLIP) ×4 IMPLANT
CONT SPEC 4OZ CLIKSEAL STRL BL (MISCELLANEOUS) ×2 IMPLANT
CORDS BIPOLAR (ELECTRODE) ×2 IMPLANT
COVER SURGICAL LIGHT HANDLE (MISCELLANEOUS) ×2 IMPLANT
CRADLE DONUT ADULT HEAD (MISCELLANEOUS) ×2 IMPLANT
DRAIN CHANNEL 15F RND FF W/TCR (WOUND CARE) ×2 IMPLANT
DRAIN SNY 10 ROU (WOUND CARE) IMPLANT
ELECT COATED BLADE 2.86 ST (ELECTRODE) ×2 IMPLANT
ELECT REM PT RETURN 9FT ADLT (ELECTROSURGICAL) ×2
ELECTRODE REM PT RTRN 9FT ADLT (ELECTROSURGICAL) ×1 IMPLANT
EVACUATOR SILICONE 100CC (DRAIN) ×4 IMPLANT
GAUZE SPONGE 4X4 16PLY XRAY LF (GAUZE/BANDAGES/DRESSINGS) ×2 IMPLANT
GLOVE BIO SURGEON STRL SZ 6.5 (GLOVE) ×6 IMPLANT
GLOVE BIO SURGEON STRL SZ7 (GLOVE) ×2 IMPLANT
GLOVE ECLIPSE 8.0 STRL XLNG CF (GLOVE) ×4 IMPLANT
GOWN STRL REUS W/ TWL LRG LVL3 (GOWN DISPOSABLE) ×2 IMPLANT
GOWN STRL REUS W/ TWL XL LVL3 (GOWN DISPOSABLE) ×1 IMPLANT
GOWN STRL REUS W/TWL LRG LVL3 (GOWN DISPOSABLE) ×2
GOWN STRL REUS W/TWL XL LVL3 (GOWN DISPOSABLE) ×1
KIT BASIN OR (CUSTOM PROCEDURE TRAY) ×2 IMPLANT
KIT ROOM TURNOVER OR (KITS) ×2 IMPLANT
LOCATOR NERVE 3 VOLT (DISPOSABLE) IMPLANT
NS IRRIG 1000ML POUR BTL (IV SOLUTION) ×2 IMPLANT
PAD ARMBOARD 7.5X6 YLW CONV (MISCELLANEOUS) ×4 IMPLANT
PENCIL BUTTON HOLSTER BLD 10FT (ELECTRODE) ×2 IMPLANT
SOLUTION ANTI FOG 6CC (MISCELLANEOUS) ×2 IMPLANT
SPECIMEN JAR SMALL (MISCELLANEOUS) ×2 IMPLANT
SPONGE INTESTINAL PEANUT (DISPOSABLE) IMPLANT
STAPLER VISISTAT 35W (STAPLE) ×2 IMPLANT
SUT CHROMIC 3 0 PS 2 (SUTURE) IMPLANT
SUT CHROMIC 4 0 PS 2 18 (SUTURE) ×4 IMPLANT
SUT ETHILON 3 0 PS 1 (SUTURE) ×2 IMPLANT
SUT ETHILON 5 0 PS 2 18 (SUTURE) ×2 IMPLANT
SUT SILK 2 0 SH CR/8 (SUTURE) ×2 IMPLANT
TOWEL OR 17X24 6PK STRL BLUE (TOWEL DISPOSABLE) ×2 IMPLANT
TOWEL OR 17X26 10 PK STRL BLUE (TOWEL DISPOSABLE) ×2 IMPLANT
TRAY ENT MC OR (CUSTOM PROCEDURE TRAY) ×2 IMPLANT

## 2013-08-28 NOTE — Progress Notes (Signed)
Day of Surgery  Subjective: She is not having any issues except some pain with swallowing.   Objective: Vital signs in last 24 hours: Temp:  [97.9 F (36.6 C)-98.6 F (37 C)] 97.9 F (36.6 C) (08/14 1335) Pulse Rate:  [59-91] 81 (08/14 1335) Resp:  [10-20] 14 (08/14 1335) BP: (103-131)/(61-81) 125/81 mmHg (08/14 1335) SpO2:  [95 %-100 %] 100 % (08/14 1335) Weight:  [61.689 kg (136 lb)] 61.689 kg (136 lb) (08/14 1335) Last BM Date: 08/27/13  Intake/Output from previous day:   Intake/Output this shift: Total I/O In: 1150 [I.V.:1150] Out: 870 [Urine:650; Drains:20; Blood:200]  wound excellent. voice nl.   Lab Results:   Recent Labs  08/28/13 1604  WBC 10.3  HGB 10.7*  HCT 33.9*  PLT 200   BMET  Recent Labs  08/28/13 1604  CREATININE 0.53   PT/INR No results found for this basename: LABPROT, INR,  in the last 72 hours ABG No results found for this basename: PHART, PCO2, PO2, HCO3,  in the last 72 hours  Studies/Results: No results found.  Anti-infectives: Anti-infectives   Start     Dose/Rate Route Frequency Ordered Stop   08/28/13 2200  doxycycline (VIBRA-TABS) tablet 100 mg     100 mg Oral 2 times daily 08/28/13 1358        Assessment/Plan: s/p Procedure(s): RIGHT THYROID LOBECTOMY POSSIBLE TOTAL THYROIDECTOMY (Right) she has good voice and pain controlled. wound excellent. continue care  LOS: 0 days    Melissa Montane 08/28/2013

## 2013-08-28 NOTE — Interval H&P Note (Signed)
History and Physical Interval Note:  08/28/2013 8:35 AM  Kara Hall  has presented today for surgery, with the diagnosis of RIGHT THYROID MASS  The various methods of treatment have been discussed with the patient and family. After consideration of risks, benefits and other options for treatment, the patient has consented to  Procedure(s): RIGHT THYROID LOBECTOMY POSSIBLE TOTAL THYROIDECTOMY (Right) as a surgical intervention .  The patient's history has been re-reviewed, patient re-examined, no change in status, stable for surgery.  I have re-reviewed the patient's chart and labs.  Questions were answered to the patient's satisfaction.     Jodi Marble

## 2013-08-28 NOTE — Discharge Instructions (Signed)
Ice pack x 24 hrs Sleep with head elevated 3-4 nights Clean wound with Q-tip and water twice daily, then apply a thin coat of antibiotic ointment.  Shower counts as one cleaning OK to shower beginning Sunday AM Recheck my office 1 week, [phone] for an appointment Call for signs of bleeding (rapid swelling) or infection (slower swelling with pain, redness, possible fever) Prescription pain medication.  Could supplement with Ibuprofen, or substitute Ibuprofen.

## 2013-08-28 NOTE — Op Note (Signed)
08/28/2013  11:27 AM    Kara Hall  [id number]   Pre-Op Dx:  Right thyroid follicular neoplasm  Post-op Dx: Same  Proc: Right thyroid lobectomy with frozen section   Surg:  Tyson Alias MD  Assistant: Jolene Provost PA  Anes:  GOT  EBL:  25 mL  Comp:  None  Findings:  A long thin neck. Multiple nodules within the right thyroid lobe. Recurrent laryngeal nerve identified and preserved. At least one parathyroid identified and preserved.  Procedure: With the patient in a comfortable supine position, general orotracheal anesthesia was induced without difficulty. At an appropriate level, the patient was placed in a slight reverse Trendelenburg. A shoulder roll was placed and the neck was extended and the head supported in the standard fashion. The neck was palpated with the findings as above. Orienting initial on the right side was identified. The previously marked skin incision was also evaluated.  The surgical wound was infiltrated with 1% Xylocaine with 1 100,000 epinephrine, 3 ML total. A chlorhexidine sterile preparation and draping of the entire anterior neck and upper chest was performed.  A 3 cm transverse incision was sharply executed in the previously marked skin line. A superficial crosshatch was applied. This was carried down through skin and subcutaneous fat. Platysma muscle was identified and dissection was carried between the bands of the platysma in the midline. An anterior jugular vein was identified on the right side and avoided. Working down the midline, strap muscles were identified and spread in 2 layers. Fat was identified and dissected downward. No obvious thyroid isthmus is identified. Dissection upward did reveal the isthmus. A small amount of tissue from the level VI neck was harvested separately and sent for permanent interpretation.  Upon identifying the thyroid gland, the isthmus was isolated and divided. The left side was controlled with a 2-0 silk  suture ligature. The strap muscles were dissected off the lateral aspect of the right lobe. Working along the superior lobe, blunt dissection on the capsule was performed and tissue was either cauterized or ligated with clips as appropriate. This was carried around the superior pole. Working around the inferior pole, dissection was again carried out right on the gland capsule.  The carotid artery was quite superficial. Blunt dissection between the carotid and the trachea revealed the recurrent laryngeal nerve. The thyroid lobe was rolled medially after being separated from the strap muscles and having vessels dissected and controlled. At the thyroid gland was attached by Berry's ligament, the recurrent nerve was dissected up to its entry point in the laryngotracheal groove. The gland was dissected further off the trachea using cautery. It was sent for frozen section interpretation.  A small arteriolar bleeder in the superior thyroid pole region was controlled with cautery and clips. Hemostasis was observed. Nerve was intact.  A 15 French fluted round drain was placed through a separate midline puncture and laid in the right thyroid bed.  It was secured to the skin with a 3-0 Ethilon stitch.  The wound is closed in several layers reapproximating the strap muscles with 4-0 chromic, and the platysma layer also with 4-0 chromic. During closure, the frozen section came back as follicular neoplasm, indeterminate for cancer. No further surgery was felt indicated.  The skin was closed in a cosmetic fashion with a running simple 5-0 Ethilon suture. The drain was placed to suction and observed to be functioning. Again hemostasis was observed.   Dispo:   PACU to 6 N.  Plan:  Ice,  elevation, analgesia, suction drainage times 24 hours.  Anticipate discharge in the morning to home in care of family. She will not need to take the levothyroxine now.  Tyson Alias MD

## 2013-08-28 NOTE — Transfer of Care (Signed)
Immediate Anesthesia Transfer of Care Note  Patient: Kara Hall  Procedure(s) Performed: Procedure(s): RIGHT THYROID LOBECTOMY POSSIBLE TOTAL THYROIDECTOMY (Right)  Patient Location: PACU  Anesthesia Type:General  Level of Consciousness: awake and alert   Airway & Oxygen Therapy: Patient Spontanous Breathing and Patient connected to nasal cannula oxygen  Post-op Assessment: Report given to PACU RN, Post -op Vital signs reviewed and stable and Patient moving all extremities X 4  Post vital signs: Reviewed and stable  Complications: No apparent anesthesia complications

## 2013-08-28 NOTE — Anesthesia Procedure Notes (Signed)
Procedure Name: Intubation Date/Time: 08/28/2013 9:06 AM Performed by: Blair Heys E Pre-anesthesia Checklist: Patient identified, Emergency Drugs available, Suction available and Patient being monitored Patient Re-evaluated:Patient Re-evaluated prior to inductionOxygen Delivery Method: Circle system utilized Preoxygenation: Pre-oxygenation with 100% oxygen Intubation Type: IV induction Ventilation: Mask ventilation without difficulty Laryngoscope Size: Miller and 2 Grade View: Grade I Tube type: Oral Tube size: 7.5 mm Number of attempts: 1 Airway Equipment and Method: Stylet Placement Confirmation: ETT inserted through vocal cords under direct vision,  positive ETCO2 and breath sounds checked- equal and bilateral Secured at: 20 cm Tube secured with: Tape Dental Injury: Teeth and Oropharynx as per pre-operative assessment

## 2013-08-28 NOTE — Anesthesia Postprocedure Evaluation (Signed)
  Anesthesia Post-op Note  Patient: Kara Hall  Procedure(s) Performed: Procedure(s): RIGHT THYROID LOBECTOMY POSSIBLE TOTAL THYROIDECTOMY (Right)  Patient Location: PACU  Anesthesia Type:General  Level of Consciousness: awake and oriented  Airway and Oxygen Therapy: Patient Spontanous Breathing and Patient connected to face mask oxygen  Post-op Pain: mild  Post-op Assessment: Post-op Vital signs reviewed, Patient's Cardiovascular Status Stable and Respiratory Function Stable  Post-op Vital Signs: Reviewed and stable  Last Vitals:  Filed Vitals:   08/28/13 0657  BP: 103/72  Temp: 36.8 C  Resp: 20    Complications: No apparent anesthesia complications

## 2013-08-28 NOTE — Anesthesia Preprocedure Evaluation (Addendum)
Anesthesia Evaluation  Patient identified by MRN, date of birth, ID band Patient awake    Reviewed: Allergy & Precautions, H&P , NPO status , Patient's Chart, lab work & pertinent test results  Airway Mallampati: II TM Distance: >3 FB Neck ROM: Full    Dental  (+) Teeth Intact, Dental Advisory Given   Pulmonary  breath sounds clear to auscultation        Cardiovascular + Peripheral Vascular Disease and DVT Rhythm:Regular Rate:Normal     Neuro/Psych  Headaches, PSYCHIATRIC DISORDERS Anxiety Depression    GI/Hepatic   Endo/Other    Renal/GU      Musculoskeletal   Abdominal (+)  Abdomen: soft.    Peds  Hematology   Anesthesia Other Findings   Reproductive/Obstetrics                         Anesthesia Physical Anesthesia Plan  ASA: II  Anesthesia Plan: General   Post-op Pain Management:    Induction: Intravenous  Airway Management Planned: Oral ETT  Additional Equipment:   Intra-op Plan:   Post-operative Plan: Extubation in OR  Informed Consent: I have reviewed the patients History and Physical, chart, labs and discussed the procedure including the risks, benefits and alternatives for the proposed anesthesia with the patient or authorized representative who has indicated his/her understanding and acceptance.     Plan Discussed with: CRNA, Anesthesiologist and Surgeon  Anesthesia Plan Comments:        Anesthesia Quick Evaluation

## 2013-08-29 DIAGNOSIS — C73 Malignant neoplasm of thyroid gland: Secondary | ICD-10-CM | POA: Diagnosis not present

## 2013-08-29 NOTE — Progress Notes (Signed)
1 Day Post-Op  Subjective: Doing well. Voice nl. Still with sore throat  Objective: Vital signs in last 24 hours: Temp:  [97.6 F (36.4 C)-98.6 F (37 C)] 97.9 F (36.6 C) (08/15 0643) Pulse Rate:  [59-91] 64 (08/15 0643) Resp:  [10-17] 17 (08/15 0643) BP: (95-131)/(60-81) 95/70 mmHg (08/15 0643) SpO2:  [95 %-100 %] 100 % (08/15 0643) Weight:  [61.689 kg (136 lb)] 61.689 kg (136 lb) (08/14 1335) Last BM Date: 08/27/13  Intake/Output from previous day: 08/14 0701 - 08/15 0700 In: 1510 [P.O.:360; I.V.:1150] Out: 890 [Urine:650; Drains:40; Blood:200] Intake/Output this shift:    awake, voice nl. wound excellent. drain removed. cv reg l clear. ext no pain or swelling  Lab Results:   Recent Labs  08/28/13 1604  WBC 10.3  HGB 10.7*  HCT 33.9*  PLT 200   BMET  Recent Labs  08/28/13 1604  CREATININE 0.53   PT/INR No results found for this basename: LABPROT, INR,  in the last 72 hours ABG No results found for this basename: PHART, PCO2, PO2, HCO3,  in the last 72 hours  Studies/Results: No results found.  Anti-infectives: Anti-infectives   Start     Dose/Rate Route Frequency Ordered Stop   08/28/13 2200  doxycycline (VIBRA-TABS) tablet 100 mg     100 mg Oral 2 times daily 08/28/13 1358        Assessment/Plan: s/p Procedure(s): RIGHT THYROID LOBECTOMY POSSIBLE TOTAL THYROIDECTOMY (Right) Discharge  LOS: 1 day    Kara Hall 08/29/2013

## 2013-08-29 NOTE — Progress Notes (Signed)
Discharge instructions given to patient. Family is present. Prescription given. Home medications gone over. Follow up appointment to be made. Diet, activity, and incisional care discussed. Reasons to call the doctor gone over. My chart discussed. Patient verbalized understanding of instructions.

## 2013-08-29 NOTE — Progress Notes (Signed)
Utilization Review Completed.Kara Hall T8/15/2015  

## 2013-08-31 ENCOUNTER — Encounter (HOSPITAL_COMMUNITY): Payer: Self-pay | Admitting: Otolaryngology

## 2013-09-03 ENCOUNTER — Encounter: Payer: Self-pay | Admitting: Physician Assistant

## 2013-09-08 ENCOUNTER — Other Ambulatory Visit: Payer: Self-pay | Admitting: Otolaryngology

## 2013-09-08 NOTE — Pre-Procedure Instructions (Addendum)
Kara Hall  09/08/2013   Your procedure is scheduled on:  Thursday, August 27.  Report to [facility] Admitting at 5:30AM.  Call this number if you have problems the morning of surgery: [phone]   Remember:   Do not eat food or drink liquids after midnight.   Take these medicines the morning of surgery with A SIP OF WATER: If needed:ALPRAZolam [name]).    Do not wear jewelry, make-up or nail polish.  Do not wear lotions, powders, or perfumes.   Do not shave 48 hours prior to surgery.   Do not bring valuables to the hospital.               [facility] is not responsible   for any belongings or valuables.               Contacts, dentures or bridgework may not be worn into surgery.  Leave suitcase in the car. After surgery it may be brought to your room.  For patients admitted to the hospital, discharge time is determined by your treatment team.               Patients discharged the day of surgery will not be allowed to drive home.  Name and phone number of your driver: -   Special Instructions: Review  [place] - Preparing For Surgery.   Please read over the following fact sheets that you were given: Pain Booklet, Coughing and Deep Breathing and Surgical Site Infection Prevention

## 2013-09-08 NOTE — H&P (Signed)
Kara Hall, Kara Hall 43 y.o., female [id number]     Chief Complaint: Papillary carcinoma thyroid  HPI: 10 days status post RIGHT thyroidectomy.  She had moderate pain.  Some pain with swallowing, both of which have improved.  Voice is somewhat raspy but no breathing difficulty.  No tingling or twitching.     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  She will likely need radioactive iodine remnant ablation.  We will have her work with Dr. Buddy Duty later.   Her RIGHT vocal cord is weak today.  We discussed this.  We do need to proceed.  We will use a nerve integrity monitor tube.     Once again, we discussed returning to activities including work.  Prescriptions for Tylenol and Vicodin given.  She will need to use her prescription for levothyroxin after the surgery.  We will check calcium again before surgery.  PMH: Past Medical History  Diagnosis Date  . Anemia   . Peripheral vascular disease     DVT- per pt. in 2001- had vein removed.   . Anxiety     panic attack, also reports problems with depression, relative to her job & upcoming surgery , relative to life problems - with children & spouse    . Depression   . Muscle strain of hand     wearing home made splint today, L hand  . Back complaints 2013    has had steroid injection   . Refusal of blood transfusions as patient is Jehovah's Witness   . DVT (deep venous thrombosis) 2001    RLE  . Thyroid nodule   . Migraine     h/o migraines - as a teenager  & again now with family issues, states she has to lay down & rest in a dark room  (08/28/2013)    Surg Hx: Past Surgical History  Procedure Laterality Date  . Thrombectomy Right 2001    leg; after 3rd child was born; for deep vein thrombosis  . Thyroid lobectomy Right 08/28/2013  . Tubal ligation  2001  . Thyroidectomy Right 08/28/2013    Procedure: RIGHT THYROID LOBECTOMY POSSIBLE TOTAL THYROIDECTOMY;  Surgeon: Jodi Marble, MD;  Location: [facility];  Service: ENT;   Laterality: Right;    FHx:   Family History  Problem Relation Age of Onset  . Heart disease Mother   . Hyperlipidemia Mother   . Heart disease Father   . Hyperlipidemia Father   . Heart disease Brother   . Hyperlipidemia Brother   . Heart disease Brother   . Hyperlipidemia Brother    SocHx:  reports that she has never smoked. She has never used smokeless tobacco. She reports that she does not drink alcohol or use illicit drugs.  ALLERGIES:  Allergies  Allergen Reactions  . Sulfa Antibiotics Other (See Comments)    Severe bladder, and kidney infection  . Trazodone And Nefazodone Other (See Comments)    Headache every am     (Not in a hospital admission)  No results found for this or any previous visit (from the past 48 hour(s)). No results found.    Last menstrual period 08/06/2013.  PHYSICAL EXAM: She is thin and anxious.  Voice is very slightly raspy but not breathy.  Wound is healing nicely and the sutures are removed.  No neck masses.  The RIGHT vocal cord is minimally mobile in the paramedian position.     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  We are planning to remove the remainder of your thyroid gland later this week.  This is very nearly identical to what we did for you last week.  You will stay overnight one night.  Shoot for returning to work on Monday 14th of September.  I will see you back here in my office 10 days after surgery.  I will call you with the pathology report as soon as available, probably Monday or Tuesday.  TraMADol HCl - 50 MG Oral Tablet;1-2 po q4h prn pain; Qty40; R0; Rx. Hydrocodone-Acetaminophen 5-325 MG Oral Tablet;1-2 po q4-6h prn pain; YCX44; R0; Rx.  Erik Obey, Jamair Cato 09/01/5629, 5:07  PM

## 2013-09-09 ENCOUNTER — Encounter (HOSPITAL_COMMUNITY)
Admission: RE | Admit: 2013-09-09 | Discharge: 2013-09-09 | Disposition: A | Discharge status: Home / Self Care | Payer: BC Managed Care – PPO | Source: Ambulatory Visit | Attending: Otolaryngology | Admitting: Otolaryngology

## 2013-09-09 DIAGNOSIS — F3289 Other specified depressive episodes: Secondary | ICD-10-CM | POA: Diagnosis not present

## 2013-09-09 DIAGNOSIS — D649 Anemia, unspecified: Secondary | ICD-10-CM | POA: Diagnosis not present

## 2013-09-09 DIAGNOSIS — Z9889 Other specified postprocedural states: Secondary | ICD-10-CM | POA: Diagnosis not present

## 2013-09-09 DIAGNOSIS — F411 Generalized anxiety disorder: Secondary | ICD-10-CM | POA: Diagnosis not present

## 2013-09-09 DIAGNOSIS — Z01812 Encounter for preprocedural laboratory examination: Secondary | ICD-10-CM | POA: Diagnosis not present

## 2013-09-09 DIAGNOSIS — C73 Malignant neoplasm of thyroid gland: Secondary | ICD-10-CM | POA: Diagnosis present

## 2013-09-09 DIAGNOSIS — Z9851 Tubal ligation status: Secondary | ICD-10-CM | POA: Diagnosis not present

## 2013-09-09 DIAGNOSIS — Z86718 Personal history of other venous thrombosis and embolism: Secondary | ICD-10-CM | POA: Diagnosis not present

## 2013-09-09 DIAGNOSIS — F329 Major depressive disorder, single episode, unspecified: Secondary | ICD-10-CM | POA: Diagnosis not present

## 2013-09-09 LAB — CBC
HCT: 35.4 % — ABNORMAL LOW (ref 36.0–46.0)
Hemoglobin: 11.7 g/dL — ABNORMAL LOW (ref 12.0–15.0)
MCH: 27.5 pg (ref 26.0–34.0)
MCHC: 33.1 g/dL (ref 30.0–36.0)
MCV: 83.3 fL (ref 78.0–100.0)
Platelets: 309 10*3/uL (ref 150–400)
RBC: 4.25 MIL/uL (ref 3.87–5.11)
RDW: 14 % (ref 11.5–15.5)
WBC: 6 10*3/uL (ref 4.0–10.5)

## 2013-09-09 LAB — HCG, SERUM, QUALITATIVE: PREG SERUM: NEGATIVE

## 2013-09-09 LAB — COMPREHENSIVE METABOLIC PANEL
ALT: 14 U/L (ref 0–35)
AST: 16 U/L (ref 0–37)
Albumin: 3.6 g/dL (ref 3.5–5.2)
Alkaline Phosphatase: 53 U/L (ref 39–117)
Anion gap: 12 (ref 5–15)
BILIRUBIN TOTAL: 0.4 mg/dL (ref 0.3–1.2)
BUN: 13 mg/dL (ref 6–23)
CHLORIDE: 103 meq/L (ref 96–112)
CO2: 25 meq/L (ref 19–32)
Calcium: 8.5 mg/dL (ref 8.4–10.5)
Creatinine, Ser: 0.64 mg/dL (ref 0.50–1.10)
GFR calc Af Amer: >90 mL/min (ref >90)
Glucose, Bld: 85 mg/dL (ref 70–99)
Potassium: 4.1 mEq/L (ref 3.7–5.3)
SODIUM: 140 meq/L (ref 137–147)
Total Protein: 7 g/dL (ref 6.0–8.3)

## 2013-09-09 LAB — NO BLOOD PRODUCTS

## 2013-09-09 MED ORDER — CHLORHEXIDINE GLUCONATE 4 % EX LIQD: 1.0000 "application " | Freq: Once | CUTANEOUS | Status: DC

## 2013-09-09 MED ORDER — CLINDAMYCIN PHOSPHATE 600 MG/50ML IV SOLN
600.0000 mg | Freq: Once | INTRAVENOUS | Status: AC
  Administered 2013-09-10: 600 mg via INTRAVENOUS
  Filled 2013-09-09: qty 50

## 2013-09-09 NOTE — Anesthesia Preprocedure Evaluation (Addendum)
Anesthesia Evaluation   Patient awake    Reviewed: Allergy & Precautions, H&P , NPO status   History of Anesthesia Complications (+) PONV  Airway Mallampati: II TM Distance: >3 FB Neck ROM: Full    Dental  (+) Teeth Intact   Pulmonary  breath sounds clear to auscultation        Cardiovascular Rhythm:Regular Rate:Normal     Neuro/Psych Anxiety Depression    GI/Hepatic   Endo/Other    Renal/GU      Musculoskeletal   Abdominal (+)  Abdomen: soft.    Peds  Hematology   Anesthesia Other Findings   Reproductive/Obstetrics                          Anesthesia Physical Anesthesia Plan  ASA: II  Anesthesia Plan: General   Post-op Pain Management:    Induction: Intravenous  Airway Management Planned: Oral ETT  Additional Equipment:   Intra-op Plan:   Post-operative Plan: Extubation in OR  Informed Consent: I have reviewed the patients History and Physical, chart, labs and discussed the procedure including the risks, benefits and alternatives for the proposed anesthesia with the patient or authorized representative who has indicated his/her understanding and acceptance.     Plan Discussed with:   Anesthesia Plan Comments:         Anesthesia Quick Evaluation

## 2013-09-10 ENCOUNTER — Encounter (HOSPITAL_COMMUNITY): Payer: Self-pay | Admitting: *Deleted

## 2013-09-10 ENCOUNTER — Ambulatory Visit (HOSPITAL_COMMUNITY): Payer: BC Managed Care – PPO | Admitting: Anesthesiology

## 2013-09-10 ENCOUNTER — Encounter (HOSPITAL_COMMUNITY)
Admission: RE | Disposition: A | Discharge status: Home / Self Care | Payer: Self-pay | Source: Ambulatory Visit | Attending: Otolaryngology

## 2013-09-10 ENCOUNTER — Encounter (HOSPITAL_COMMUNITY): Payer: BC Managed Care – PPO | Admitting: Anesthesiology

## 2013-09-10 ENCOUNTER — Observation Stay (HOSPITAL_COMMUNITY)
Admission: RE | Admit: 2013-09-10 | Discharge: 2013-09-11 | Disposition: A | Discharge status: Home / Self Care | Payer: BC Managed Care – PPO | Source: Ambulatory Visit | Attending: Otolaryngology | Admitting: Otolaryngology

## 2013-09-10 DIAGNOSIS — Z01812 Encounter for preprocedural laboratory examination: Secondary | ICD-10-CM | POA: Insufficient documentation

## 2013-09-10 DIAGNOSIS — F411 Generalized anxiety disorder: Secondary | ICD-10-CM | POA: Insufficient documentation

## 2013-09-10 DIAGNOSIS — C73 Malignant neoplasm of thyroid gland: Principal | ICD-10-CM | POA: Insufficient documentation

## 2013-09-10 DIAGNOSIS — F329 Major depressive disorder, single episode, unspecified: Secondary | ICD-10-CM | POA: Insufficient documentation

## 2013-09-10 DIAGNOSIS — Z9851 Tubal ligation status: Secondary | ICD-10-CM | POA: Insufficient documentation

## 2013-09-10 DIAGNOSIS — Z86718 Personal history of other venous thrombosis and embolism: Secondary | ICD-10-CM | POA: Insufficient documentation

## 2013-09-10 DIAGNOSIS — Z9889 Other specified postprocedural states: Secondary | ICD-10-CM | POA: Insufficient documentation

## 2013-09-10 DIAGNOSIS — F3289 Other specified depressive episodes: Secondary | ICD-10-CM | POA: Insufficient documentation

## 2013-09-10 DIAGNOSIS — D649 Anemia, unspecified: Secondary | ICD-10-CM | POA: Insufficient documentation

## 2013-09-10 HISTORY — PX: THYROIDECTOMY: SHX17

## 2013-09-10 LAB — CALCIUM
CALCIUM: 8 mg/dL — AB (ref 8.4–10.5)
Calcium: 8.3 mg/dL — ABNORMAL LOW (ref 8.4–10.5)

## 2013-09-10 SURGERY — THYROIDECTOMY
Anesthesia: General | Site: Neck | Laterality: Left

## 2013-09-10 MED ORDER — HYDROCODONE-ACETAMINOPHEN 5-325 MG PO TABS: 1.0000 | ORAL_TABLET | ORAL | Status: DC | PRN

## 2013-09-10 MED ORDER — MORPHINE SULFATE 2 MG/ML IJ SOLN
INTRAMUSCULAR | Status: AC
  Filled 2013-09-10: qty 1

## 2013-09-10 MED ORDER — BACITRACIN ZINC 500 UNIT/GM EX OINT
TOPICAL_OINTMENT | CUTANEOUS | Status: DC | PRN
  Administered 2013-09-10: 1 via TOPICAL

## 2013-09-10 MED ORDER — FENTANYL CITRATE 0.05 MG/ML IJ SOLN
INTRAMUSCULAR | Status: DC | PRN
  Administered 2013-09-10 (×4): 50 ug via INTRAVENOUS

## 2013-09-10 MED ORDER — SUCCINYLCHOLINE CHLORIDE 20 MG/ML IJ SOLN
INTRAMUSCULAR | Status: AC
  Filled 2013-09-10: qty 1

## 2013-09-10 MED ORDER — DIPHENHYDRAMINE HCL 50 MG/ML IJ SOLN
INTRAMUSCULAR | Status: AC
  Filled 2013-09-10: qty 1

## 2013-09-10 MED ORDER — PROMETHAZINE HCL 25 MG RE SUPP: 25.0000 mg | Freq: Four times a day (QID) | RECTAL | Status: DC | PRN

## 2013-09-10 MED ORDER — MORPHINE SULFATE 2 MG/ML IJ SOLN
1.0000 mg | INTRAMUSCULAR | Status: DC | PRN
  Administered 2013-09-10 – 2013-09-11 (×2): 2 mg via INTRAVENOUS
  Filled 2013-09-10 (×2): qty 1

## 2013-09-10 MED ORDER — PROMETHAZINE HCL 25 MG/ML IJ SOLN: 6.2500 mg | INTRAMUSCULAR | Status: DC | PRN

## 2013-09-10 MED ORDER — FENTANYL CITRATE 0.05 MG/ML IJ SOLN
INTRAMUSCULAR | Status: AC
  Filled 2013-09-10: qty 2

## 2013-09-10 MED ORDER — FENTANYL CITRATE 0.05 MG/ML IJ SOLN
INTRAMUSCULAR | Status: AC
  Filled 2013-09-10: qty 5

## 2013-09-10 MED ORDER — FENTANYL CITRATE 0.05 MG/ML IJ SOLN
25.0000 ug | INTRAMUSCULAR | Status: DC | PRN
  Administered 2013-09-10: 50 ug via INTRAVENOUS
  Administered 2013-09-10 (×4): 25 ug via INTRAVENOUS

## 2013-09-10 MED ORDER — LIDOCAINE-EPINEPHRINE 1 %-1:100000 IJ SOLN
INTRAMUSCULAR | Status: DC | PRN
  Administered 2013-09-10: 5 mL

## 2013-09-10 MED ORDER — LIDOCAINE HCL (CARDIAC) 20 MG/ML IV SOLN
INTRAVENOUS | Status: DC | PRN
  Administered 2013-09-10 (×2): 50 mg via INTRAVENOUS

## 2013-09-10 MED ORDER — LIDOCAINE-EPINEPHRINE 1 %-1:100000 IJ SOLN
INTRAMUSCULAR | Status: AC
  Filled 2013-09-10: qty 1

## 2013-09-10 MED ORDER — BACITRACIN ZINC 500 UNIT/GM EX OINT
1.0000 "application " | TOPICAL_OINTMENT | Freq: Three times a day (TID) | CUTANEOUS | Status: DC
  Administered 2013-09-10 – 2013-09-11 (×3): 1 via TOPICAL
  Filled 2013-09-10: qty 15
  Filled 2013-09-10: qty 28.35

## 2013-09-10 MED ORDER — LIDOCAINE HCL (CARDIAC) 20 MG/ML IV SOLN
INTRAVENOUS | Status: AC
  Filled 2013-09-10: qty 5

## 2013-09-10 MED ORDER — DEXAMETHASONE SODIUM PHOSPHATE 4 MG/ML IJ SOLN
INTRAMUSCULAR | Status: AC
  Filled 2013-09-10: qty 1

## 2013-09-10 MED ORDER — LABETALOL HCL 5 MG/ML IV SOLN
INTRAVENOUS | Status: AC
  Filled 2013-09-10: qty 4

## 2013-09-10 MED ORDER — ONDANSETRON HCL 4 MG/2ML IJ SOLN
INTRAMUSCULAR | Status: AC
  Filled 2013-09-10: qty 2

## 2013-09-10 MED ORDER — LACTATED RINGERS IV SOLN
INTRAVENOUS | Status: DC | PRN
  Administered 2013-09-10: 07:00:00 via INTRAVENOUS

## 2013-09-10 MED ORDER — DEXTROSE-NACL 5-0.45 % IV SOLN
INTRAVENOUS | Status: DC
  Administered 2013-09-10 (×2): via INTRAVENOUS

## 2013-09-10 MED ORDER — PHENYLEPHRINE HCL 10 MG/ML IJ SOLN
INTRAMUSCULAR | Status: DC | PRN
  Administered 2013-09-10: 80 ug via INTRAVENOUS

## 2013-09-10 MED ORDER — MEPERIDINE HCL 25 MG/ML IJ SOLN: 6.2500 mg | INTRAMUSCULAR | Status: DC | PRN

## 2013-09-10 MED ORDER — BACITRACIN ZINC 500 UNIT/GM EX OINT
TOPICAL_OINTMENT | CUTANEOUS | Status: AC
  Filled 2013-09-10: qty 15

## 2013-09-10 MED ORDER — ARTIFICIAL TEARS OP OINT
TOPICAL_OINTMENT | OPHTHALMIC | Status: AC
  Filled 2013-09-10: qty 3.5

## 2013-09-10 MED ORDER — TRAMADOL HCL 50 MG PO TABS
50.0000 mg | ORAL_TABLET | Freq: Every day | ORAL | Status: DC
  Administered 2013-09-10 – 2013-09-11 (×2): 50 mg via ORAL
  Filled 2013-09-10 (×2): qty 1

## 2013-09-10 MED ORDER — PROMETHAZINE HCL 25 MG PO TABS
25.0000 mg | ORAL_TABLET | Freq: Four times a day (QID) | ORAL | Status: DC | PRN
  Administered 2013-09-11: 25 mg via ORAL
  Filled 2013-09-10: qty 1

## 2013-09-10 MED ORDER — MIDAZOLAM HCL 5 MG/5ML IJ SOLN
INTRAMUSCULAR | Status: DC | PRN
  Administered 2013-09-10: 2 mg via INTRAVENOUS

## 2013-09-10 MED ORDER — HYDROCODONE-ACETAMINOPHEN 5-325 MG PO TABS
1.0000 | ORAL_TABLET | ORAL | Status: DC | PRN
  Administered 2013-09-10 (×2): 2 via ORAL
  Administered 2013-09-11 (×2): 1 via ORAL
  Filled 2013-09-10 (×3): qty 2

## 2013-09-10 MED ORDER — LIDOCAINE HCL 4 % MT SOLN
OROMUCOSAL | Status: DC | PRN
  Administered 2013-09-10: 4 mL via TOPICAL

## 2013-09-10 MED ORDER — ALPRAZOLAM 0.5 MG PO TABS
0.5000 mg | ORAL_TABLET | Freq: Every evening | ORAL | Status: DC | PRN
  Administered 2013-09-10: 0.5 mg via ORAL
  Filled 2013-09-10: qty 1

## 2013-09-10 MED ORDER — ONDANSETRON HCL 4 MG/2ML IJ SOLN
INTRAMUSCULAR | Status: DC | PRN
  Administered 2013-09-10: 4 mg via INTRAVENOUS

## 2013-09-10 MED ORDER — MIDAZOLAM HCL 2 MG/2ML IJ SOLN
INTRAMUSCULAR | Status: AC
  Filled 2013-09-10: qty 2

## 2013-09-10 MED ORDER — PROPOFOL 10 MG/ML IV BOLUS
INTRAVENOUS | Status: AC
  Filled 2013-09-10: qty 20

## 2013-09-10 MED ORDER — PROPOFOL 10 MG/ML IV BOLUS
INTRAVENOUS | Status: DC | PRN
  Administered 2013-09-10: 100 mg via INTRAVENOUS

## 2013-09-10 MED ORDER — DEXAMETHASONE SODIUM PHOSPHATE 4 MG/ML IJ SOLN
INTRAMUSCULAR | Status: DC | PRN
  Administered 2013-09-10: 4 mg via INTRAVENOUS

## 2013-09-10 MED ORDER — 0.9 % SODIUM CHLORIDE (POUR BTL) OPTIME
TOPICAL | Status: DC | PRN
  Administered 2013-09-10: 1000 mL

## 2013-09-10 MED ORDER — PHENYLEPHRINE 40 MCG/ML (10ML) SYRINGE FOR IV PUSH (FOR BLOOD PRESSURE SUPPORT)
PREFILLED_SYRINGE | INTRAVENOUS | Status: AC
  Filled 2013-09-10: qty 10

## 2013-09-10 MED ORDER — LEVOTHYROXINE SODIUM 100 MCG PO TABS
100.0000 ug | ORAL_TABLET | Freq: Every day | ORAL | Status: DC
  Administered 2013-09-11: 100 ug via ORAL
  Filled 2013-09-10 (×2): qty 1

## 2013-09-10 MED ORDER — ARTIFICIAL TEARS OP OINT
TOPICAL_OINTMENT | OPHTHALMIC | Status: DC | PRN
  Administered 2013-09-10: 1 via OPHTHALMIC

## 2013-09-10 MED ORDER — ZOLPIDEM TARTRATE 5 MG PO TABS
5.0000 mg | ORAL_TABLET | Freq: Every evening | ORAL | Status: DC | PRN
  Administered 2013-09-11: 5 mg via ORAL
  Filled 2013-09-10: qty 1

## 2013-09-10 MED ORDER — MORPHINE SULFATE 2 MG/ML IJ SOLN
2.0000 mg | Freq: Once | INTRAMUSCULAR | Status: AC
  Administered 2013-09-10: 2 mg via INTRAVENOUS

## 2013-09-10 MED ORDER — DIPHENHYDRAMINE HCL 50 MG/ML IJ SOLN
INTRAMUSCULAR | Status: DC | PRN
  Administered 2013-09-10: 6.25 mg via INTRAVENOUS

## 2013-09-10 MED ORDER — LABETALOL HCL 5 MG/ML IV SOLN
INTRAVENOUS | Status: DC | PRN
  Administered 2013-09-10: 5 mg via INTRAVENOUS

## 2013-09-10 MED ORDER — PHENOL 1.4 % MT LIQD: 2.0000 | OROMUCOSAL | Status: DC | PRN

## 2013-09-10 SURGICAL SUPPLY — 57 items
APPLIER CLIP 9.375 SM OPEN (CLIP) ×2
ATTRACTOMAT 16X20 MAGNETIC DRP (DRAPES) ×2 IMPLANT
BLADE SURG 10 STRL SS (BLADE) ×2 IMPLANT
BLADE SURG 15 STRL LF DISP TIS (BLADE) ×1 IMPLANT
BLADE SURG 15 STRL SS (BLADE) ×1
BLADE SURG ROTATE 9660 (MISCELLANEOUS) IMPLANT
CANISTER SUCTION 2500CC (MISCELLANEOUS) ×2 IMPLANT
CLEANER TIP ELECTROSURG 2X2 (MISCELLANEOUS) ×2 IMPLANT
CLIP APPLIE 9.375 SM OPEN (CLIP) ×1 IMPLANT
CONT SPEC 4OZ CLIKSEAL STRL BL (MISCELLANEOUS) ×2 IMPLANT
CORDS BIPOLAR (ELECTRODE) ×2 IMPLANT
COVER SURGICAL LIGHT HANDLE (MISCELLANEOUS) ×2 IMPLANT
CRADLE DONUT ADULT HEAD (MISCELLANEOUS) ×2 IMPLANT
DECANTER SPIKE VIAL GLASS SM (MISCELLANEOUS) ×2 IMPLANT
DRAIN SNY 10 ROU (WOUND CARE) ×2 IMPLANT
ELECT COATED BLADE 2.86 ST (ELECTRODE) ×2 IMPLANT
ELECT PAIRED SUBDERMAL (MISCELLANEOUS) ×2
ELECT REM PT RETURN 9FT ADLT (ELECTROSURGICAL) ×2
ELECTRODE PAIRED SUBDERMAL (MISCELLANEOUS) ×1 IMPLANT
ELECTRODE REM PT RTRN 9FT ADLT (ELECTROSURGICAL) ×1 IMPLANT
EVACUATOR SILICONE 100CC (DRAIN) ×2 IMPLANT
GAUZE SPONGE 4X4 16PLY XRAY LF (GAUZE/BANDAGES/DRESSINGS) ×2 IMPLANT
GLOVE BIO SURGEON STRL SZ 6.5 (GLOVE) ×2 IMPLANT
GLOVE BIO SURGEON STRL SZ7 (GLOVE) ×6 IMPLANT
GLOVE BIOGEL PI IND STRL 7.0 (GLOVE) ×1 IMPLANT
GLOVE BIOGEL PI IND STRL 7.5 (GLOVE) ×2 IMPLANT
GLOVE BIOGEL PI INDICATOR 7.0 (GLOVE) ×1
GLOVE BIOGEL PI INDICATOR 7.5 (GLOVE) ×2
GLOVE ECLIPSE 8.0 STRL XLNG CF (GLOVE) ×2 IMPLANT
GOWN STRL REUS W/ TWL LRG LVL3 (GOWN DISPOSABLE) ×6 IMPLANT
GOWN STRL REUS W/ TWL XL LVL3 (GOWN DISPOSABLE) ×1 IMPLANT
GOWN STRL REUS W/TWL LRG LVL3 (GOWN DISPOSABLE) ×6
GOWN STRL REUS W/TWL XL LVL3 (GOWN DISPOSABLE) ×1
KIT BASIN OR (CUSTOM PROCEDURE TRAY) ×2 IMPLANT
KIT ROOM TURNOVER OR (KITS) ×2 IMPLANT
LOCATOR NERVE 3 VOLT (DISPOSABLE) ×2 IMPLANT
NS IRRIG 1000ML POUR BTL (IV SOLUTION) ×2 IMPLANT
PAD ARMBOARD 7.5X6 YLW CONV (MISCELLANEOUS) ×4 IMPLANT
PENCIL BUTTON HOLSTER BLD 10FT (ELECTRODE) ×2 IMPLANT
PROBE NERVBE PRASS .33 (MISCELLANEOUS) ×2 IMPLANT
SCRUB FOAM CHG 2% SURGICAL (MISCELLANEOUS) ×2 IMPLANT
SHEARS HARMONIC 9CM CVD (BLADE) ×2 IMPLANT
SPECIMEN JAR SMALL (MISCELLANEOUS) ×2 IMPLANT
SPONGE INTESTINAL PEANUT (DISPOSABLE) ×2 IMPLANT
STAPLER VISISTAT 35W (STAPLE) ×2 IMPLANT
STRIP CLOSURE SKIN 1/2X4 (GAUZE/BANDAGES/DRESSINGS) IMPLANT
SUT CHROMIC 3 0 PS 2 (SUTURE) IMPLANT
SUT CHROMIC 4 0 PS 2 18 (SUTURE) ×2 IMPLANT
SUT ETHILON 3 0 PS 1 (SUTURE) ×2 IMPLANT
SUT ETHILON 5 0 PS 2 18 (SUTURE) ×2 IMPLANT
SUT SILK 2 0 SH CR/8 (SUTURE) ×2 IMPLANT
SUT SILK 3 0 REEL (SUTURE) IMPLANT
TOWEL OR 17X24 6PK STRL BLUE (TOWEL DISPOSABLE) ×2 IMPLANT
TOWEL OR 17X26 10 PK STRL BLUE (TOWEL DISPOSABLE) ×4 IMPLANT
TRAY ENT MC OR (CUSTOM PROCEDURE TRAY) ×2 IMPLANT
TRAY FOLEY CATH 14FRSI W/METER (CATHETERS) IMPLANT
WATER STERILE IRR 1000ML POUR (IV SOLUTION) ×2 IMPLANT

## 2013-09-10 NOTE — Op Note (Signed)
09/10/2013  9:48 AM    Matilde Bash  [id number]   Pre-Op Dx:  Papillary cancer of thyroid  Post-op Dx: same  Proc: Completion LEFT thyroid lobectomy   Surg:  Jodi Marble T MD  Asst:  Jolene Provost PA  Anes:  GOT  EBL:  min  Comp:  none  Findings:   Nl thyroid lobe.  Recurrent nerve identified and protected.  Apparent nl LEFT cord movement at completion of procedure.  No adenopathy  Procedure: In a comfortable supine position, general orotracheal anesthesia was induced using I nerve monitoring  ET tube. appropriate positioning was identified.  The nerve monitor was in the standard fashion.  The patient was placed in a slight reverse Trendelenburg. A shoulder roll was placed. The neck was palpated with the findings as described above. 1% Xylocaine with 1 100,000 epinephrine, 5 mL total was infiltrated into the surgical wound field. Several minutes were allowed for this to take effect. Chlorhexidine sterile preparation and draping of the entire neck and upper chest was performed in the standard fashion.  A 2 mm margin was sharply marked and incised around the previous incision. The previous incision was sharply opened. Sharp and blunt dissection carried down to the anterior trachea.    The suture on the thyroid isthmus was identified. This was retracted with an Wellsite geologist. The strap muscles were dissected off of the  left lobe of the thyroid.  The superior and inferior poles of the thyroid were developed bluntly and then tissue and vessels were controlled with harmonic scalpel. The dissection was carried across the face of the trachea down to Barry's ligament. The thyroid lobe was rolled medially and the dissection was carried out between the trachea and the carotid artery. Several areas consistent with parathyroid gland were identified and preserved. Soft tissue dissection was carried on the capsule of the gland. Finally, approaching the Barry's Ligament region, the  recurrent laryngeal nerve was identified.  It was observed to dive down into the soft tissue in the tracheoesophageal groove undisturbed. A small amount of thyroid was left remaining above the nerve at [facility] ligament. At This point the thyroid lobe was delivered.  Hemostasis was observed.  The wound was irrigated and suctioned clean. No bleeding was noted with Valsalva.  A 10 French perforated drain was placed through the previous drain site and laid into the thyroid bed on the left. This was secured to the skin with a 3-0 nylon stitch.  The strap muscles were reapproximated with a single 4-0 chromic in the midline. Subcutaneous layer was reapproximated with interrupted 4-0 chromic sutures. Again hemostasis was observed. The skin was closed with a running simple 5-0 Ethilon with good cosmesis. The drain was functioning.  Upon awakening, using the anesthesia glide scope, apparent motion of the left vocal cord was identified. The patient was extubated without difficulty.  Dispo:    PACU to 23 hour observation  Plan:    follow calcium.  Await permanent pathology reports. Suction drainage x24 hours.  We'll work with Dr. Buddy Duty regarding possible radioactive iodine thyroid ablation.  Tyson Alias MD

## 2013-09-10 NOTE — Interval H&P Note (Signed)
History and Physical Interval Note:  09/10/2013 7:23 AM  Kara Hall  has presented today for surgery, with the diagnosis of THRYOID PAPILLARY CANCER  The various methods of treatment have been discussed with the patient and family. After consideration of risks, benefits and other options for treatment, the patient has consented to  Procedure(s): COMPLETE LEFT THYROIDECTOMY (Left) as a surgical intervention .  The patient's history has been re-reviewed, patient re-examined, no change in status, stable for surgery.  I have re-reviewed the patient's chart and labs.  Questions were answered to the patient's satisfaction.     Jodi Marble

## 2013-09-10 NOTE — Discharge Instructions (Signed)
No strenuous activity x 2 weeks.  Anticipate return to work on 14 SEP Ice pack as desired. Keep head elevated 3-4 nights Clean wound with Q-tip and water twice daily, then apply a thin coat of antibiotic ointment OK to shower beginning Friday, 28 AUG Recheck my office 1 week for suture removal.  ([phone] for an appointment) Advance diet as tolerated. Gentle activity as tolerated Call for tingling or twitching, numbness, swelling of neck, breathing problems ([phone])

## 2013-09-10 NOTE — Progress Notes (Signed)
Notified Dr. Tresa Moore of patient's low bp (took 1.5 tabs xanax this am and sleep pill last night).

## 2013-09-10 NOTE — H&P (View-Only) (Signed)
Kara Hall, Kara Hall 43 y.o., female [id number]     Chief Complaint: Papillary carcinoma thyroid  HPI: 10 days status post RIGHT thyroidectomy.  She had moderate pain.  Some pain with swallowing, both of which have improved.  Voice is somewhat raspy but no breathing difficulty.  No tingling or twitching.     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  She will likely need radioactive iodine remnant ablation.  We will have her work with Dr. Buddy Duty later.   Her RIGHT vocal cord is weak today.  We discussed this.  We do need to proceed.  We will use a nerve integrity monitor tube.     Once again, we discussed returning to activities including work.  Prescriptions for Tylenol and Vicodin given.  She will need to use her prescription for levothyroxin after the surgery.  We will check calcium again before surgery.  PMH: Past Medical History  Diagnosis Date  . Anemia   . Peripheral vascular disease     DVT- per pt. in 2001- had vein removed.   . Anxiety     panic attack, also reports problems with depression, relative to her job & upcoming surgery , relative to life problems - with children & spouse    . Depression   . Muscle strain of hand     wearing home made splint today, L hand  . Back complaints 2013    has had steroid injection   . Refusal of blood transfusions as patient is Jehovah's Witness   . DVT (deep venous thrombosis) 2001    RLE  . Thyroid nodule   . Migraine     h/o migraines - as a teenager  & again now with family issues, states she has to lay down & rest in a dark room  (08/28/2013)    Surg Hx: Past Surgical History  Procedure Laterality Date  . Thrombectomy Right 2001    leg; after 3rd child was born; for deep vein thrombosis  . Thyroid lobectomy Right 08/28/2013  . Tubal ligation  2001  . Thyroidectomy Right 08/28/2013    Procedure: RIGHT THYROID LOBECTOMY POSSIBLE TOTAL THYROIDECTOMY;  Surgeon: Jodi Marble, MD;  Location: [facility] OR;  Service: ENT;   Laterality: Right;    FHx:   Family History  Problem Relation Age of Onset  . Heart disease Mother   . Hyperlipidemia Mother   . Heart disease Father   . Hyperlipidemia Father   . Heart disease Brother   . Hyperlipidemia Brother   . Heart disease Brother   . Hyperlipidemia Brother    SocHx:  reports that she has never smoked. She has never used smokeless tobacco. She reports that she does not drink alcohol or use illicit drugs.  ALLERGIES:  Allergies  Allergen Reactions  . Sulfa Antibiotics Other (See Comments)    Severe bladder, and kidney infection  . Trazodone And Nefazodone Other (See Comments)    Headache every am     (Not in a hospital admission)  No results found for this or any previous visit (from the past 48 hour(s)). No results found.    Last menstrual period 08/06/2013.  PHYSICAL EXAM: She is thin and anxious.  Voice is very slightly raspy but not breathy.  Wound is healing nicely and the sutures are removed.  No neck masses.  The RIGHT vocal cord is minimally mobile in the paramedian position.     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  We are planning to remove the remainder of your thyroid gland later this week.  This is very nearly identical to what we did for you last week.  You will stay overnight one night.  Shoot for returning to work on Monday 14th of September.  I will see you back here in my office 10 days after surgery.  I will call you with the pathology report as soon as available, probably Monday or Tuesday.  TraMADol HCl - 50 MG Oral Tablet;1-2 po q4h prn pain; Qty40; R0; Rx. Hydrocodone-Acetaminophen 5-325 MG Oral Tablet;1-2 po q4-6h prn pain; VFI43; R0; Rx.  Erik Obey, Thamas Appleyard 04/13/5186, 5:07  PM

## 2013-09-10 NOTE — Progress Notes (Signed)
Subjective: Doing well, breathing without difficulty, swallowing with some discomfort. She rates her pain at 8/10. She is taking Vicodin which helps a little bit.  Objective: Vital signs in last 24 hours: Temp:  [97.4 F (36.3 C)-98.6 F (37 C)] 98 F (36.7 C) (08/27 1405) Pulse Rate:  [58-87] 76 (08/27 1405) Resp:  [7-25] 20 (08/27 1405) BP: (83-130)/(40-78) 118/73 mmHg (08/27 1405) SpO2:  [99 %-100 %] 100 % (08/27 1405) Weight:  [134 lb (60.782 kg)] 134 lb (60.782 kg) (08/27 0623) Weight change:     Intake/Output from previous day:   Intake/Output this shift: Total I/O In: 1250 [I.V.:1250] Out: 935 [Urine:900; Drains:20; Blood:15]  PHYSICAL EXAM: Voice appears normal. Incision intact, without any swelling or hematoma. JP is functioning.  Lab Results:  Recent Labs  09/09/13 0935  WBC 6.0  HGB 11.7*  HCT 35.4*  PLT 309   BMET  Recent Labs  09/09/13 0935  NA 140  K 4.1  CL 103  CO2 25  GLUCOSE 85  BUN 13  CREATININE 0.64  CALCIUM 8.5    Studies/Results: No results found.  Medications: I have reviewed the patient's current medications.  Assessment/Plan: Stable postop. Continue overnight care.  LOS: 0 days   Kara Hall 09/10/2013, 5:19 PM

## 2013-09-10 NOTE — Transfer of Care (Signed)
Immediate Anesthesia Transfer of Care Note  Patient: Kara Hall  Procedure(s) Performed: Procedure(s): COMPLETE LEFT THYROIDECTOMY (Left)  Patient Location: PACU  Anesthesia Type:General  Level of Consciousness: awake, alert , oriented and sedated  Airway & Oxygen Therapy: Patient Spontanous Breathing and Patient connected to nasal cannula oxygen  Post-op Assessment: Report given to PACU RN, Post -op Vital signs reviewed and stable and Patient moving all extremities  Post vital signs: Reviewed and stable  Complications: No apparent anesthesia complications

## 2013-09-10 NOTE — Anesthesia Postprocedure Evaluation (Signed)
  Anesthesia Post-op Note  Patient: Kara Hall  Procedure(s) Performed: Procedure(s): COMPLETE LEFT THYROIDECTOMY (Left)  Patient Location: PACU  Anesthesia Type:General  Level of Consciousness: alert  and oriented  Airway and Oxygen Therapy: Patient Spontanous Breathing and Patient connected to nasal cannula oxygen  Post-op Pain: mild  Post-op Assessment: Post-op Vital signs reviewed, Patient's Cardiovascular Status Stable, Respiratory Function Stable, No signs of Nausea or vomiting and Pain level controlled  Post-op Vital Signs: Reviewed and stable  Last Vitals:  Filed Vitals:   09/10/13 1100  BP:   Pulse: 74  Temp: 37 C  Resp: 13    Complications: No apparent anesthesia complications

## 2013-09-10 NOTE — Anesthesia Procedure Notes (Addendum)
Procedure Name: Intubation Date/Time: 09/10/2013 7:39 AM Performed by: Scheryl Darter Pre-anesthesia Checklist: Patient identified, Emergency Drugs available, Suction available, Patient being monitored and Timeout performed Patient Re-evaluated:Patient Re-evaluated prior to inductionOxygen Delivery Method: Circle system utilized Preoxygenation: Pre-oxygenation with 100% oxygen Intubation Type: IV induction Ventilation: Mask ventilation without difficulty Laryngoscope Size: Miller and 2 Grade View: Grade I Tube type: Oral (NIMS TUBE) Tube size: 6.0 (NIMS ETT used/# 7.0 too large/attempt x 1) mm Number of attempts: 2 (easy visuaization/ #7.0 NIMS tube too large to easily pass/pt w  minimal cord damage after last surgery 08/28/2013 thyroid lobectomy) Airway Equipment and Method: Stylet Placement Confirmation: ETT inserted through vocal cords under direct vision,  positive ETCO2 and breath sounds checked- equal and bilateral Secured at: 21 cm Tube secured with: Tape Dental Injury: Teeth and Oropharynx as per pre-operative assessment

## 2013-09-11 ENCOUNTER — Encounter (HOSPITAL_COMMUNITY): Payer: Self-pay | Admitting: Otolaryngology

## 2013-09-11 DIAGNOSIS — C73 Malignant neoplasm of thyroid gland: Secondary | ICD-10-CM | POA: Diagnosis not present

## 2013-09-11 LAB — CALCIUM
Calcium: 7.6 mg/dL — ABNORMAL LOW (ref 8.4–10.5)
Calcium: 7.8 mg/dL — ABNORMAL LOW (ref 8.4–10.5)

## 2013-09-11 MED ORDER — CALCIUM CARBONATE-VITAMIN D 500-200 MG-UNIT PO TABS: 2.0000 | ORAL_TABLET | Freq: Two times a day (BID) | ORAL | Status: DC

## 2013-09-11 MED ORDER — ALPRAZOLAM 0.5 MG PO TABS: 0.5000 mg | ORAL_TABLET | Freq: Three times a day (TID) | ORAL | Status: DC | PRN

## 2013-09-11 MED ORDER — OXYCODONE HCL 5 MG/5ML PO SOLN
5.0000 mg | ORAL | Status: DC | PRN
  Administered 2013-09-11: 10 mg via ORAL
  Filled 2013-09-11: qty 10

## 2013-09-11 MED ORDER — OXYCODONE HCL 5 MG/5ML PO SOLN: 5.0000 mg | ORAL | Status: DC | PRN

## 2013-09-11 NOTE — Discharge Summary (Signed)
  09/11/2013 10:11 AM  Kara Hall [id number]  Post-Op Day 1    Temp:  [97.3 F (36.3 C)-98.6 F (37 C)] 97.7 F (36.5 C) (08/28 0615) Pulse Rate:  [57-87] 63 (08/28 0615) Resp:  [10-24] 20 (08/28 0615) BP: (101-130)/(54-78) 110/74 mmHg (08/28 0615) SpO2:  [99 %-100 %] 100 % (08/28 0615),     Intake/Output Summary (Last 24 hours) at 09/11/13 1011 Last data filed at 09/11/13 0600  Gross per 24 hour  Intake 3033.75 ml  Output   2320 ml  Net 713.75 ml   Drain 45 ml  Results for orders placed during the hospital encounter of 09/10/13 (from the past 24 hour(s))  CALCIUM     Status: Abnormal   Collection Time    09/10/13  3:34 PM      Result Value Ref Range   Calcium 8.3 (*) 8.4 - 10.5 mg/dL  CALCIUM     Status: Abnormal   Collection Time    09/10/13 10:38 PM      Result Value Ref Range   Calcium 8.0 (*) 8.4 - 10.5 mg/dL  CALCIUM     Status: Abnormal   Collection Time    09/11/13  5:38 AM      Result Value Ref Range   Calcium 7.6 (*) 8.4 - 10.5 mg/dL    SUBJECTIVE:  Mod lg pain. Anxious about distractions from dog and from son when she goes home.    Breathing OK.  Swallowing is tender.  OBJECTIVE:  Voice cl.  resp unlabored. Chvostek's neg. Wound flat.  Drain d/c'd.  IMPRESSION:  Satisfactory check  PLAN:  Discharge to home and care of family  Admit:  27 AUG D/C: 28 AUG Final Diagnosis:  Papillary thyroid cancer Proc:  Completion LEFT thyroidectomy, 27 AUG Comp:  None Cond: ambulatory.  Breathing well. Pain controlled.  No tingling.  Ca++ sl low.  Taking po fluids well Recheck: 1 week Rx's:  Tramadol, Vicodin, Oxycodone liquid, Xanax, Levothyroxine, OsCal+D Instructions written and given  Hosp Course:  Underwent surgery without comp.  By direct laryngoscopy at conclusion, RIGHT cord immobile,  LEFT cord working well.  Mod pain.  Min drainage.  Voiding spont.  Voice and breathing OK.  On POD 1 Drain removed and discharged to home and care of family.  Ca++  7.6 this AM.    Jodi Marble

## 2013-09-11 NOTE — Progress Notes (Signed)
UR completed 

## 2013-09-11 NOTE — Progress Notes (Signed)
Discharge paperwork given to patient. Prescriptions given to patient. Patient is ready for discharge.

## 2013-11-29 ENCOUNTER — Ambulatory Visit (INDEPENDENT_AMBULATORY_CARE_PROVIDER_SITE_OTHER): Payer: BC Managed Care – PPO | Admitting: Physician Assistant

## 2013-11-29 VITALS — BP 110/70 | HR 63 | Temp 97.9°F | Resp 16 | Ht 67.5 in | Wt 139.5 lb

## 2013-11-29 DIAGNOSIS — J019 Acute sinusitis, unspecified: Secondary | ICD-10-CM

## 2013-11-29 MED ORDER — IPRATROPIUM BROMIDE 0.03 % NA SOLN: 2.0000 | Freq: Two times a day (BID) | NASAL | Status: DC

## 2013-11-29 MED ORDER — HYDROCOD POLST-CHLORPHEN POLST 10-8 MG/5ML PO LQCR: 5.0000 mL | Freq: Two times a day (BID) | ORAL | Status: DC | PRN

## 2013-11-29 MED ORDER — AMOXICILLIN-POT CLAVULANATE 875-125 MG PO TABS: 1.0000 | ORAL_TABLET | Freq: Two times a day (BID) | ORAL | Status: AC

## 2013-11-29 MED ORDER — GUAIFENESIN ER 1200 MG PO TB12: 1.0000 | ORAL_TABLET | Freq: Two times a day (BID) | ORAL | Status: DC | PRN

## 2013-11-29 NOTE — Progress Notes (Signed)
Subjective:    Patient ID: Kara Hall, female    DOB: [birth year], 43 y.o.   MRN: [id number]   PCP: Jenny Reichmann, MD  Chief Complaint  Patient presents with  . Sore Throat  . Ear Pain    Allergies  Allergen Reactions  . Sulfa Antibiotics Other (See Comments)    Severe bladder, and kidney infection  . Trazodone And Nefazodone Other (See Comments)    Headache every am    Patient Active Problem List   Diagnosis Date Noted  . Primary thyroid cancer 08/09/2013  . Anxiety 01/12/2013    Prior to Admission medications   Medication Sig Start Date End Date Taking? Authorizing Provider  ALPRAZolam [name]) 0.5 MG tablet Take 1 tablet (0.5 mg total) by mouth 3 (three) times daily as needed for anxiety. 8/52/77  Yes Jodi Marble, MD  calcium-vitamin D (OSCAL WITH D) 500-200 MG-UNIT per tablet Take 2 tablets by mouth 2 (two) times daily. 09/07/21  Yes Jodi Marble, MD  levothyroxine (SYNTHROID, LEVOTHROID) 125 MCG tablet Take 125 mcg by mouth daily before breakfast.   Yes Historical Provider, MD    Medical, Surgical, Family and Social History reviewed and updated.  HPI  This 43 y.o. female presents for evaluation of sore throat, cough and sinus congestion for 4-5 days. Symptoms began after she dusted the shelves in her home and "aggravated" her allergies.  She feels awful. Notes that tessalon perles have helped her cough in the past but she knows that won't help her throat. She asks for an antibiotic "even if my strep test is negative."  No fever, chills. No nausea/vomiting. Normal urination. Lots of sinus pressure and some facial pain. Ear pain bilaterally. Sneezing. Blowing lots of "yellow stuff" from the nose.   Review of Systems As above.    Objective:   Physical Exam  Constitutional: She is oriented to person, place, and time. Vital signs are normal. She appears well-developed and well-nourished. She is active and cooperative.  Non-toxic appearance. She has a sickly  appearance. She does not appear ill. No distress.  BP 110/70 mmHg  Pulse 63  Temp(Src) 97.9 F (36.6 C) (Oral)  Resp 16  Ht 5' 7.5" (1.715 m)  Wt 139 lb 8 oz (63.277 kg)  BMI 21.51 kg/m2  SpO2 98%  LMP 11/26/2013   HENT:  Head: Normocephalic and atraumatic.  Right Ear: Hearing, tympanic membrane, external ear and ear canal normal.  Left Ear: Hearing, tympanic membrane, external ear and ear canal normal.  Nose: Mucosal edema and rhinorrhea present.  No foreign bodies. Right sinus exhibits no maxillary sinus tenderness and no frontal sinus tenderness. Left sinus exhibits no maxillary sinus tenderness and no frontal sinus tenderness.  Mouth/Throat: Uvula is midline, oropharynx is clear and moist and mucous membranes are normal. No uvula swelling. No oropharyngeal exudate.  Eyes: Conjunctivae and EOM are normal. Pupils are equal, round, and reactive to light. Right eye exhibits no discharge. Left eye exhibits no discharge. No scleral icterus.  Neck: Trachea normal, normal range of motion and full passive range of motion without pain. Neck supple. No thyroid mass and no thyromegaly present.  Cardiovascular: Normal rate, regular rhythm and normal heart sounds.   Pulmonary/Chest: Effort normal and breath sounds normal.  Lymphadenopathy:       Head (right side): No submandibular, no tonsillar, no preauricular, no posterior auricular and no occipital adenopathy present.       Head (left side): No submandibular, no tonsillar, no preauricular and no  occipital adenopathy present.    She has no cervical adenopathy.       Right: No supraclavicular adenopathy present.       Left: No supraclavicular adenopathy present.  Neurological: She is alert and oriented to person, place, and time. She has normal strength. No cranial nerve deficit or sensory deficit.  Skin: Skin is warm, dry and intact. No rash noted.  Psychiatric: She has a normal mood and affect. Her speech is normal and behavior is normal.           Assessment & Plan:  1. Subacute sinusitis, unspecified location Supportive care. Anticipatory guidance provided. - ipratropium (ATROVENT) 0.03 % nasal spray; Place 2 sprays into both nostrils 2 (two) times daily.  Dispense: 30 mL; Refill: 0 - Guaifenesin (MUCINEX MAXIMUM STRENGTH) 1200 MG TB12; Take 1 tablet (1,200 mg total) by mouth every 12 (twelve) hours as needed.  Dispense: 14 tablet; Refill: 1 - chlorpheniramine-HYDROcodone (TUSSIONEX PENNKINETIC ER) 10-8 MG/5ML LQCR; Take 5 mLs by mouth every 12 (twelve) hours as needed for cough (cough).  Dispense: 100 mL; Refill: 0 - amoxicillin-clavulanate (AUGMENTIN) 875-125 MG per tablet; Take 1 tablet by mouth 2 (two) times daily.  Dispense: 20 tablet; Refill: 0   Fara Chute, PA-C Physician Assistant-Certified [facility]

## 2013-11-29 NOTE — Patient Instructions (Signed)
Get plenty of rest and drink at least 64 ounces of water daily. 

## 2014-01-04 ENCOUNTER — Other Ambulatory Visit: Payer: Self-pay | Admitting: Internal Medicine

## 2014-01-04 DIAGNOSIS — C73 Malignant neoplasm of thyroid gland: Secondary | ICD-10-CM

## 2014-01-20 ENCOUNTER — Other Ambulatory Visit: Payer: BC Managed Care – PPO

## 2014-01-20 ENCOUNTER — Ambulatory Visit
Admission: RE | Admit: 2014-01-20 | Discharge: 2014-01-20 | Disposition: A | Discharge status: Home / Self Care | Payer: BC Managed Care – PPO | Source: Ambulatory Visit | Attending: Internal Medicine | Admitting: Internal Medicine

## 2014-01-20 ENCOUNTER — Other Ambulatory Visit: Payer: Self-pay

## 2014-01-20 DIAGNOSIS — C73 Malignant neoplasm of thyroid gland: Secondary | ICD-10-CM

## 2014-05-04 ENCOUNTER — Ambulatory Visit (INDEPENDENT_AMBULATORY_CARE_PROVIDER_SITE_OTHER): Payer: BLUE CROSS/BLUE SHIELD | Admitting: Family Medicine

## 2014-05-04 ENCOUNTER — Encounter: Payer: Self-pay | Admitting: Family Medicine

## 2014-05-04 VITALS — BP 130/74 | HR 70 | Temp 97.9°F | Resp 16 | Ht 66.5 in | Wt 141.0 lb

## 2014-05-04 DIAGNOSIS — L02435 Carbuncle of right lower limb: Secondary | ICD-10-CM

## 2014-05-04 DIAGNOSIS — L02425 Furuncle of right lower limb: Secondary | ICD-10-CM | POA: Diagnosis not present

## 2014-05-04 DIAGNOSIS — L0292 Furuncle, unspecified: Secondary | ICD-10-CM

## 2014-05-04 MED ORDER — CEPHALEXIN 500 MG PO CAPS
500.0000 mg | ORAL_CAPSULE | Freq: Three times a day (TID) | ORAL | Status: DC
Start: 2014-05-04 — End: 2014-06-27

## 2014-05-04 MED ORDER — TRAMADOL HCL 50 MG PO TABS: 50.0000 mg | ORAL_TABLET | Freq: Four times a day (QID) | ORAL | Status: DC | PRN

## 2014-05-04 NOTE — Patient Instructions (Signed)
Warm compresses 3-4 times a day until swelling resolved Remove dressing in 24 hours and reapply dry dressing. It is ok if the packing falls out. Remove packing in 2 days and continue to keep dry and covered until there is no longer an open wound. Return if you have more swelling, pain or develop a fever  Abscess An abscess is an infected area that contains a collection of pus and debris.It can occur in almost any part of the body. An abscess is also known as a furuncle or boil. CAUSES  An abscess occurs when tissue gets infected. This can occur from blockage of oil or sweat glands, infection of hair follicles, or a minor injury to the skin. As the body tries to fight the infection, pus collects in the area and creates pressure under the skin. This pressure causes pain. People with weakened immune systems have difficulty fighting infections and get certain abscesses more often.  SYMPTOMS Usually an abscess develops on the skin and becomes a painful mass that is red, warm, and tender. If the abscess forms under the skin, you may feel a moveable soft area under the skin. Some abscesses break open (rupture) on their own, but most will continue to get worse without care. The infection can spread deeper into the body and eventually into the bloodstream, causing you to feel ill.  DIAGNOSIS  Your caregiver will take your medical history and perform a physical exam. A sample of fluid may also be taken from the abscess to determine what is causing your infection. TREATMENT  Your caregiver may prescribe antibiotic medicines to fight the infection. However, taking antibiotics alone usually does not cure an abscess. Your caregiver may need to make a small cut (incision) in the abscess to drain the pus. In some cases, gauze is packed into the abscess to reduce pain and to continue draining the area. HOME CARE INSTRUCTIONS   Only take over-the-counter or prescription medicines for pain, discomfort, or fever as  directed by your caregiver.  If you were prescribed antibiotics, take them as directed. Finish them even if you start to feel better.  If gauze is used, follow your caregiver's directions for changing the gauze.  To avoid spreading the infection:  Keep your draining abscess covered with a bandage.  Wash your hands well.  Do not share personal care items, towels, or whirlpools with others.  Avoid skin contact with others.  Keep your skin and clothes clean around the abscess.  Keep all follow-up appointments as directed by your caregiver. SEEK MEDICAL CARE IF:   You have increased pain, swelling, redness, fluid drainage, or bleeding.  You have muscle aches, chills, or a general ill feeling.  You have a fever. MAKE SURE YOU:   Understand these instructions.  Will watch your condition.  Will get help right away if you are not doing well or get worse. Document Released: 10/11/2004 Document Revised: 07/03/2011 Document Reviewed: 03/16/2011 [facility] Patient Information 2015 [place], [place]. This information is not intended to replace advice given to you by your health care provider. Make sure you discuss any questions you have with your health care provider.

## 2014-05-04 NOTE — Progress Notes (Signed)
   Subjective:    Patient ID: Kara Hall, female    DOB: 06/19/1970, 44 y.o.   MRN: [id number]  HPI This is a very pleasant 44 yo female who presents today with 4-5 day history of a boil of her upper, right inner thigh. She is accompanied by her adult daughter. She has used an OTC salve to draw out the fluid and she has used a sterilized needle to open the lesion and had a lot of pus drain. She has not used any compresses. Over the last couple of days, she has noted more bloody drainage and the area feels hard under the skin. She has been keeping it covered with a bandage. She is requesting some tramadol for pain.   Review of Systems No fever or chills, moderate pain at site    Objective:   Physical Exam  Constitutional: She is oriented to person, place, and time. She appears well-developed and well-nourished.  Mildly anxious.   HENT:  Head: Normocephalic and atraumatic.  Eyes: Conjunctivae are normal.  Neck: Normal range of motion. Neck supple.  Cardiovascular: Normal rate.   Pulmonary/Chest: Effort normal.  Musculoskeletal: Normal range of motion.  Neurological: She is alert and oriented to person, place, and time.  Skin: Skin is warm and dry. She is not diaphoretic.  Right upper inner thigh with 5 mm open area with small amount serous drainage. Approximately 1.5 cm area firmness palpated beneath the skin. Very tender.    Psychiatric: She has a normal mood and affect. Her behavior is normal. Judgment and thought content normal.  Vitals reviewed. BP 130/74 mmHg  Pulse 70  Temp(Src) 97.9 F (36.6 C) (Oral)  Resp 16  Ht 5' 6.5" (1.689 m)  Wt 141 lb (63.957 kg)  BMI 22.42 kg/m2  SpO2 97%  LMP 04/27/2014  Procedure note- VCO. Area cleaned with betadine and alcohol. Lidocaine 2% with epi used for local anesthesia. #10 scalpel used to make 1 cm incision. Small amount bloody drainage. Area explored; small amount bloody drainage. Small amount of packing inserted and dressing  applied. Wound culture obtained.     Assessment & Plan:  1. Boil/abscess of right upper, inner thigh - Wound culture - traMADol (ULTRAM) 50 MG tablet; Take 1 tablet (50 mg total) by mouth every 6 (six) hours as needed.  Dispense: 20 tablet; Refill: 0 - cephALEXin (KEFLEX) 500 MG capsule; Take 1 capsule (500 mg total) by mouth 3 (three) times daily.  Dispense: 21 capsule; Refill: 0 - provided written and verbal instructions including- Warm compresses 3-4 times a day until swelling resolved Remove dressing in 24 hours and reapply dry dressing. It is ok if the packing falls out. Remove packing in 2 days and continue to keep dry and covered until there is no longer an open wound. Return if you have more swelling, pain or develop a fever   Elby Beck, FNP-BC  [facility]  05/04/2014 11:39 AM

## 2014-05-05 ENCOUNTER — Telehealth: Payer: Self-pay | Admitting: *Deleted

## 2014-05-05 NOTE — Telephone Encounter (Signed)
Pt stated that her dressing fell off and packing was hanging.  I advised her not to pull it out and to just cut whats hanging out.  She is also in pain and state that the tramadol is not working.  She thinks her body is use to the tramadol.  She would like something stronger.

## 2014-05-05 NOTE — Telephone Encounter (Signed)
Called patient.She was able to trim packing and add more bandaging. She has taken ibuprofen 200 mg. Denies fever. Encouraged her to take ibuprofen 600 mg every 8-12 hours and continue warm compresses.

## 2014-05-06 LAB — WOUND CULTURE: Gram Stain: NONE SEEN

## 2014-05-17 ENCOUNTER — Telehealth: Payer: Self-pay

## 2014-05-17 NOTE — Telephone Encounter (Signed)
Kara Hall   Patient wants to talk with you regarding her last OV.   She is working and it is coming back .    [phone]

## 2014-05-18 NOTE — Telephone Encounter (Signed)
lmom to cb. 

## 2014-06-27 ENCOUNTER — Ambulatory Visit (INDEPENDENT_AMBULATORY_CARE_PROVIDER_SITE_OTHER): Payer: BLUE CROSS/BLUE SHIELD | Admitting: Family Medicine

## 2014-06-27 VITALS — BP 104/72 | HR 67 | Temp 98.1°F | Resp 16 | Ht 66.0 in | Wt 141.4 lb

## 2014-06-27 DIAGNOSIS — L0292 Furuncle, unspecified: Secondary | ICD-10-CM

## 2014-06-27 MED ORDER — TRAMADOL HCL 50 MG PO TABS: 50.0000 mg | ORAL_TABLET | Freq: Four times a day (QID) | ORAL | Status: DC | PRN

## 2014-06-27 MED ORDER — DOXYCYCLINE HYCLATE 100 MG PO CAPS: 100.0000 mg | ORAL_CAPSULE | Freq: Two times a day (BID) | ORAL | Status: DC

## 2014-06-27 MED ORDER — MUPIROCIN 2 % EX OINT: 1.0000 "application " | TOPICAL_OINTMENT | Freq: Three times a day (TID) | CUTANEOUS | Status: DC

## 2014-06-27 NOTE — Progress Notes (Signed)
Subjective:  This chart was scribed for Delman Cheadle, MD by Moises Blood, Medical Scribe. This patient was seen in Room 3 and the patient's care was started 1:35 PM.    Patient ID: Kara Hall, female    DOB: 10-07-1970, 44 y.o.   MRN: [id number] Chief Complaint  Patient presents with  . Recurrent Skin Infections    boil in the middle of anus    HPI Kara Hall is a 44 y.o. female who presents to [facility] complaining of gradual onset boil near middle of anus. Patient has been seen several times at our clinic prior. She has a history of folliculitis. She was seen 2 months ago for a boil at her right inner upper thigh that she drained herself using a sterile needle. She was placed on keflex and tramadol. The wound culture from that showed MRSA about 2 months ago.   She was speaking rapidly with some anxiousness.    Past Medical History  Diagnosis Date  . Anemia   . Peripheral vascular disease     DVT- per pt. in 2001- had vein removed.   . Anxiety     panic attack, also reports problems with depression, relative to her job & upcoming surgery , relative to life problems - with children & spouse    . Depression   . Muscle strain of hand     wearing home made splint today, L hand  . Back complaints 2013    has had steroid injection   . Refusal of blood transfusions as patient is Jehov[employer]'s Witness   . DVT (deep venous thrombosis) 2001    RLE  . Thyroid nodule   . Migraine     h/o migraines - as a teenager  & again now with family issues, states she has to lay down & rest in a dark room  (08/28/2013)   Current Outpatient Prescriptions on File Prior to Visit  Medication Sig Dispense Refill  . ALPRAZolam (XANAX) 0.5 MG tablet Take 1 tablet (0.5 mg total) by mouth 3 (three) times daily as needed for anxiety. 30 tablet 0  . levothyroxine (SYNTHROID, LEVOTHROID) 125 MCG tablet Take 125 mcg by mouth daily before breakfast.    . traMADol (ULTRAM) 50 MG tablet Take 1 tablet (50 mg total)  by mouth every 6 (six) hours as needed. (Patient not taking: Reported on 06/27/2014) 20 tablet 0   No current facility-administered medications on file prior to visit.   Allergies  Allergen Reactions  . Sulfa Antibiotics Other (See Comments)    Severe bladder, and kidney infection  . Trazodone And Nefazodone Other (See Comments)    Headache every am      Review of Systems  Constitutional: Negative for fever, chills, activity change and appetite change.  Gastrointestinal: Positive for rectal pain. Negative for diarrhea and constipation.  Genitourinary: Negative for difficulty urinating.  Musculoskeletal: Positive for gait problem.  Skin: Positive for color change and rash (boil near anus).  Hematological: Negative for adenopathy. Does not bruise/bleed easily.  Psychiatric/Behavioral: The patient is nervous/anxious.        Objective:   Physical Exam  Constitutional: She is oriented to person, place, and time. She appears well-developed and well-nourished. No distress.  HENT:  Head: Normocephalic and atraumatic.  Eyes: EOM are normal. Pupils are equal, round, and reactive to light.  Neck: Neck supple.  Cardiovascular: Normal rate.   Pulmonary/Chest: Effort normal. No respiratory distress.  Musculoskeletal: Normal range of motion.  Neurological: She is alert  and oriented to person, place, and time.  Skin: Skin is warm and dry.  on buttock around mid medial left gluteal crease, small 1 cm nodule with 0.5 cm enduration and pustule no warmth and no drainage, mild erythema, Tenderness to palpation  Psychiatric: She has a normal mood and affect. Her behavior is normal.  Nursing note and vitals reviewed.   BP 104/72 mmHg  Pulse 67  Temp(Src) 98.1 F (36.7 C) (Oral)  Resp 16  Ht 5\' 6"  (1.676 m)  Wt 141 lb 6.4 oz (64.139 kg)  BMI 22.83 kg/m2  SpO2 97%  LMP 06/20/2014       Assessment & Plan:  Last on keflex 2 months ago for the boil Will be doxy due to sulfa allergy,  patient requested the most inexpensive medication available 1. Boil     Meds ordered this encounter  Medications  . traMADol (ULTRAM) 50 MG tablet    Sig: Take 1 tablet (50 mg total) by mouth every 6 (six) hours as needed.    Dispense:  20 tablet    Refill:  0  . mupirocin ointment (BACTROBAN) 2 %    Sig: Apply 1 application topically 3 (three) times daily.    Dispense:  30 g    Refill:  1  . doxycycline (VIBRAMYCIN) 100 MG capsule    Sig: Take 1 capsule (100 mg total) by mouth 2 (two) times daily.    Dispense:  20 capsule    Refill:  0    I personally performed the services described in this documentation, which was scribed in my presence. The recorded information has been reviewed and considered, and addended by me as needed.  Delman Cheadle, MD MPH

## 2014-06-27 NOTE — Progress Notes (Signed)
Procedure Consent obtained. Iodine prep. 1 cc 1% lido local anesthesia. 1/2 cm incision made. Purulence drained. Wound irrigated and explored. Cleaned dressing placed. Care instructions given.

## 2014-06-27 NOTE — Patient Instructions (Signed)
Abscess Care After An abscess (also called a boil or furuncle) is an infected area that contains a collection of pus. Signs and symptoms of an abscess include pain, tenderness, redness, or hardness, or you may feel a moveable soft area under your skin. An abscess can occur anywhere in the body. The infection may spread to surrounding tissues causing cellulitis. A cut (incision) by the surgeon was made over your abscess and the pus was drained out. Gauze may have been packed into the space to provide a drain that will allow the cavity to heal from the inside outwards. The boil may be painful for 5 to 7 days. Most people with a boil do not have high fevers. Your abscess, if seen early, may not have localized, and may not have been lanced. If not, another appointment may be required for this if it does not get better on its own or with medications. HOME CARE INSTRUCTIONS   Only take over-the-counter or prescription medicines for pain, discomfort, or fever as directed by your caregiver.  When you bathe, soak and then remove gauze or iodoform packs at least daily or as directed by your caregiver. You may then wash the wound gently with mild soapy water. Repack with gauze or do as your caregiver directs. SEEK IMMEDIATE MEDICAL CARE IF:   You develop increased pain, swelling, redness, drainage, or bleeding in the wound site.  You develop signs of generalized infection including muscle aches, chills, fever, or a general ill feeling.  An oral temperature above 102 F (38.9 C) develops, not controlled by medication. See your caregiver for a recheck if you develop any of the symptoms described above. If medications (antibiotics) were prescribed, take them as directed. Document Released: 07/20/2004 Document Revised: 03/26/2011 Document Reviewed: 03/17/2007 [facility] Patient Information 2015 [place], [place]. This information is not intended to replace advice given to you by your health care provider. Make sure  you discuss any questions you have with your health care provider.  Community-Associated MRSA CA-MRSA stands for community-associated methicillin-resistant Staphylococcus aureus. MRSA is a type of bacteria that is resistant to some common antibiotics. It can cause infections in the skin and many other places in the body. Staphylococcus aureus, often called "staph," is a bacteria that normally lives on the skin or in the nose. Staph on the surface of the skin or in the nose does not cause problems. However, if the staph enters the body through a cut, wound, or break in the skin, an infection can happen. Up until recently, infections with the MRSA type of staph mainly occurred in hospitals and other health care settings. There are now increasing problems with MRSA infections in the community as well. Infections with MRSA may be very serious or even life threatening. CA-MRSA is becoming more common. It is known to spread in crowded settings, in jails and prisons, and in situations where there is close skin-to-skin contact, such as during sporting events or in locker rooms. MRSA can be spread through shared items, such as children's toys, razors, towels, or sports equipment.  CAUSES All staph, including MRSA, are normally harmless unless they enter the body through a scratch, cut, or wound, such as with surgery. All staph, including MRSA, can be spread from person-to-person by touching contaminated objects or through direct contact.  MRSA now causes illness in people who have not been in hospitals or other health care facilities. Cases of MRSA diseases in the community have been associated with:  Recent antibiotic use.  Sharing contaminated  towels or clothes.  Having active skin diseases.  Participating in contact sports.  Living in crowded settings.  Intravenous (IV) drug use.  Community-associated MRSA infections are usually skin infections, but may cause other severe illnesses.  Staph  bacteria are one of the most common causes of skin infection. However, they are also a common cause of pneumonia, bone or joint infections, and bloodstream infections. DIAGNOSIS Diagnosis of MRSA is done by cultures of fluid samples that may come from:  Swabs taken from cuts or wounds in infected areas.  Nasal swabs.  Saliva or deep cough specimens from the lungs (sputum).  Urine.  Blood. Many people are "colonized" with MRSA but have no signs of infection. This means that people carry the MRSA germ on their skin or in their nose and may never develop MRSA infection.  TREATMENT  Treatment varies and is based on how serious, how deep, or how extensive the infection is. For example:  Some skin infections, such as a small boil or abscess, may be treated by draining yellowish-white fluid (pus) from the site of the infection.  Deeper or more widespread soft tissue infections are usually treated with surgery to drain pus and with antibiotic medicine given by vein or by mouth. This may be recommended even if you are pregnant.  Serious infections may require a hospital stay. If antibiotics are given, they may be needed for several weeks. PREVENTION Because many people are colonized with staph, including MRSA, preventing the spread of the bacteria from person-to-person is most important. The best way to prevent the spread of bacteria and other germs is through proper hand washing or by using alcohol-based hand disinfectants. The following are other ways to help prevent MRSA infection within community settings.   Wash your hands frequently with soap and water for at least 15 seconds. Otherwise, use alcohol-based hand disinfectants when soap and water is not available.  Make sure people who live with you wash their hands often, too.  Do not share personal items. For example, avoid sharing razors and other personal hygiene items, towels, clothing, and athletic equipment.  Wash and dry your  clothes and bedding at the warmest temperatures recommended on the labels.  Keep wounds covered. Pus from infected sores may contain MRSA and other bacteria. Keep cuts and abrasions clean and covered with germ-free (sterile), dry bandages until they are healed.  If you have a wound that appears infected, ask your caregiver if a culture for MRSA and other bacteria should be done.  If you are breastfeeding, talk to your caregiver about MRSA. You may be asked to temporarily stop breastfeeding. HOME CARE INSTRUCTIONS   Take your antibiotics as directed. Finish them even if you start to feel better.  Avoid close contact with those around you as much as possible. Do not use towels, razors, toothbrushes, bedding, or other items that will be used by others.  To fight the infection, follow your caregiver's instructions for wound care. Wash your hands before and after changing your bandages.  If you have an intravascular device, such as a catheter, make sure you know how to care for it.  Be sure to tell any health care providers that you have MRSA so they are aware of your infection. SEEK IMMEDIATE MEDICAL CARE IF:  The infection appears to be getting worse. Signs include:  Increased warmth, redness, or tenderness around the wound site.  A red line that extends from the infection site.  A dark color in the area around  the infection.  Wound drainage that is tan, yellow, or green.  A bad smell coming from the wound.  You feel sick to your stomach (nauseous) and throw up (vomit) or cannot keep medicine down.  You have a fever.  Your baby is older than 3 months with a rectal temperature of 102F (38.9C) or higher.  Your baby is 78 months old or younger with a rectal temperature of 100.38F (38C) or higher.  You have difficulty breathing. MAKE SURE YOU:   Understand these instructions.  Will watch your condition.  Will get help right away if you are not doing well or get  worse. Document Released: 04/06/2005 Document Revised: 05/18/2013 Document Reviewed: 04/06/2010 [facility] Patient Information 2015 [place], [place]. This information is not intended to replace advice given to you by your health care provider. Make sure you discuss any questions you have with your health care provider. MRSA FAQs What is MRSA? Staphylococcus aureus (pronounced staff-ill-oh-KOK-us AW-ree-us), or "Staph" is a very common germ that about 1 out of every 3 people have on their skin or in their nose. This germ does not cause any problems for most people who have it on their skin. But sometimes it can cause serious infections such as skin or wound infections, pneumonia, or infections of the blood.  Antibiotics are given to kill Staph germs when they cause infections. Some Staph are resistant, meaning they cannot be killed by some antibiotics. "Methicillin-resistant Staphylococcus aureus" or "MRSA" is a type of Staph that is resistant to some of the antibiotics that are often used to treat Staph infections. Who is most likely to get an MRSA infection? In the hospital, people who are more likely to get an MRSA infection are people who:  have other health conditions making them sick  have been in the hospital or a nursing home  have been treated with antibiotics. People who are healthy and who have not been in the hospital or a nursing home can also get MRSA infections. These infections usually involve the skin. More information about this type of MRSA infection, known as "community-associated MRSA" infection, is available from the Centers for Disease Control and Prevention (CDC). http://rios.biz/ How do I get an MRSA infection? People who have MRSA germs on their skin or who are infected with MRSA may be able to spread the germ to other people. MRSA can be passed on to bed linens, bed rails, bathroom fixtures, and medical equipment. It can spread to other people on contaminated equipment  and on the hands of doctors, nurses, other healthcare providers and visitors. Can MRSA infections be treated? Yes, there are antibiotics that can kill MRSA germs. Some patients with MRSA abscesses may need surgery to drain the infection. Your healthcare provider will determine which treatments are best for you. What are some of the things that hospitals are doing to prevent MRSA infections? To prevent MRSA infections, doctors, nurses and other healthcare providers:  Clean their hands with soap and water or an alcohol-based hand rub before and after caring for every patient.  Carefully clean hospital rooms and medical equipment.  Use Contact Precautions when caring for patients with MRSA. Contact Precautions mean:  Whenever possible, patients with MRSA will have a single room or will share a room only with someone else who also has MRSA.  Healthcare providers will put on gloves and wear a gown over their clothing while taking care of patients with MRSA.  Visitors may also be asked to wear a gown and gloves.  When leaving the room, hospital providers and visitors remove their gown and gloves and clean their hands.  Patients on Contact Precautions are asked to stay in their hospital rooms as much as possible. They should not go to common areas, such as the gift shop or cafeteria. They may go to other areas of the hospital for treatments and tests.  May test some patients to see if they have MRSA on their skin. This test involves rubbing a cotton-tipped swab in the patient's nostrils or on the skin. What can I do to help prevent MRSA infections? In the hospital  Make sure that all doctors, nurses, and other healthcare providers clean their hands with soap and water or an alcohol-based hand rub before and after caring for you. If you do not see your providers clean their hands, please ask them to do so. When you go home  If you have wounds or an intravascular device (such as a catheter or  dialysis port) make sure that you know how to take care of them. Can my friends and family get MRSA when they visit me? The chance of getting MRSA while visiting a person who has MRSA is very low. To decrease the chance of getting MRSA your family and friends should:  Clean their hands before they enter your room and when they leave.  Ask a healthcare provider if they need to wear protective gowns and gloves when they visit you. What do I need to do when I go home from the hospital? To prevent another MRSA infection and to prevent spreading MRSA to others:  Keep taking any antibiotics prescribed by your doctor. Don't take half-doses or stop before you complete your prescribed course.  Clean your hands often, especially before and after changing your wound dressing or bandage.  People who live with you should clean their hands often as well.  Keep any wounds clean and change bandages as instructed until healed.  Avoid sharing personal items such as towels or razors.  Wash and dry your clothes and bed linens in the warmest temperatures recommended on the labels.  Tell your healthcare providers that you have MRSA. This includes home health nurses and aides, therapists, and personnel in doctors' offices.  Your doctor may have more instructions for you. If you have questions, please ask your doctor or nurse. Developed and co-sponsored by [employer] for [place] [id number]); Infectious Diseases Society of [place] (IDSA); [place]; Association for Professionals in Infection Control and Epidemiology (APIC); Centers for Disease Control and Prevention (CDC); and [employer]. Document Released: 01/06/2013 Document Reviewed: 01/06/2013 [facility] Patient Information 2015 [place], [place]. This information is not intended to replace advice given to you by your health care provider. Make sure you discuss any questions you have with your health care  provider.

## 2014-06-29 ENCOUNTER — Telehealth: Payer: Self-pay

## 2014-06-29 NOTE — Telephone Encounter (Signed)
Tried to contact again. No answer. lmom to cb.

## 2014-06-29 NOTE — Telephone Encounter (Signed)
Pt states she was seen for a staph infection and since it is located at an awkward place, it is so uncomfortable and causing her back to hurt so bad, she is coming in tomorrow but need to Know what can she do today to feel better, is also taking her pain meds. Please call [phone]

## 2014-06-29 NOTE — Telephone Encounter (Signed)
I have attempted to contact the patient but it went to voicemail.  Please call her again.  Please advise her that she needs to come in today.  We are open until 8:30pm.  She can continue taking the tramadol, if she is not having fever, nausea, dizziness, chills, sob, incontinence, increased redness or swelling to the area, or extremity changes.  If she does not have these symptoms, and is going to wait tomorrow, she can attempt warm sitz baths if she does not have packing placed in the wound.  She may take 100mg  (2 tablets) every 6 hours.  But she really should return sooner if the pain can not be controlled or listed symptoms above.  If she has other questions regarding this, she needs to come in.

## 2014-06-30 ENCOUNTER — Ambulatory Visit (INDEPENDENT_AMBULATORY_CARE_PROVIDER_SITE_OTHER): Payer: BLUE CROSS/BLUE SHIELD | Admitting: Physician Assistant

## 2014-06-30 VITALS — BP 122/72 | HR 76 | Temp 98.6°F | Resp 17 | Ht 66.5 in | Wt 144.0 lb

## 2014-06-30 DIAGNOSIS — F4323 Adjustment disorder with mixed anxiety and depressed mood: Secondary | ICD-10-CM

## 2014-06-30 DIAGNOSIS — Z5189 Encounter for other specified aftercare: Secondary | ICD-10-CM

## 2014-06-30 MED ORDER — ALPRAZOLAM 0.5 MG PO TABS
0.5000 mg | ORAL_TABLET | Freq: Three times a day (TID) | ORAL | Status: DC | PRN
Start: 2014-06-30 — End: 2014-11-10

## 2014-06-30 NOTE — Progress Notes (Signed)
[facility] [address] [place] [address] [phone]  Date:  06/30/2014   Name:  Kara Hall   DOB:  1970-03-11   MRN:  [id number]  PCP:  Jenny Reichmann, MD    History of Present Illness:  Kara Hall is a 44 y.o. female patient who presents to [facility]  For follow-up of wound status post I&D of an abscess at her right  Botox. Patient complains that she is having a lot of nausea and headache. She is not vomiting or dizzy. She has had no fever or increased pain at the wound site. There is no abnormal drainage at the site. She has been cleaning appropriately. Patient states that she takes her medication without any food in the morning and later on in the day.  Patient would also like a refill of her medication for xanax.  This was first given to her when she was undergoing diagnosis and treatment of her thyroid cancer.  Patient states that she has 3 tablets left from 1 year ago, and only takes it emergency.  She had to use one yesterday, when she on her way to work, due to her current symptoms and pain.    Patient Active Problem List   Diagnosis Date Noted  . Primary thyroid cancer 08/09/2013  . Anxiety 01/12/2013    Past Medical History  Diagnosis Date  . Anemia   . Peripheral vascular disease     DVT- per pt. in 2001- had vein removed.   . Anxiety     panic attack, also reports problems with depression, relative to her job & upcoming surgery , relative to life problems - with children & spouse    . Depression   . Muscle strain of hand     wearing home made splint today, L hand  . Back complaints 2013    has had steroid injection   . Refusal of blood transfusions as patient is Jehovah's Witness   . DVT (deep venous thrombosis) 2001    RLE  . Thyroid nodule   . Migraine     h/o migraines - as a teenager  & again now with family issues, states she has to lay down & rest in a dark room  (08/28/2013)    Past Surgical History  Procedure Laterality  Date  . Thrombectomy Right 2001    leg; after 3rd child was born; for deep vein thrombosis  . Tubal ligation  2001  . Thyroidectomy Right 08/28/2013    Procedure: RIGHT THYROID LOBECTOMY POSSIBLE TOTAL THYROIDECTOMY;  Surgeon: Jodi Marble, MD;  Location: [place];  Service: ENT;  Laterality: Right;  . Thyroidectomy Left 09/10/2013    Procedure: COMPLETE LEFT THYROIDECTOMY;  Surgeon: Jodi Marble, MD;  Location: [place];  Service: ENT;  Laterality: Left;    History  Substance Use Topics  . Smoking status: Never Smoker   . Smokeless tobacco: Never Used  . Alcohol Use: No    Family History  Problem Relation Age of Onset  . Heart disease Mother   . Hyperlipidemia Mother   . Heart disease Father   . Hyperlipidemia Father   . Heart disease Brother   . Hyperlipidemia Brother   . Heart disease Brother   . Hyperlipidemia Brother     Allergies  Allergen Reactions  . Sulfa Antibiotics Other (See Comments)    Severe bladder, and kidney infection  . Trazodone And Nefazodone Other (See Comments)    Headache every am  Medication list has been reviewed and updated.  Current Outpatient Prescriptions on File Prior to Visit  Medication Sig Dispense Refill  . ALPRAZolam (XANAX) 0.5 MG tablet Take 1 tablet (0.5 mg total) by mouth 3 (three) times daily as needed for anxiety. 30 tablet 0  . doxycycline (VIBRAMYCIN) 100 MG capsule Take 1 capsule (100 mg total) by mouth 2 (two) times daily. 20 capsule 0  . levothyroxine (SYNTHROID, LEVOTHROID) 125 MCG tablet Take 125 mcg by mouth daily before breakfast.    . mupirocin ointment (BACTROBAN) 2 % Apply 1 application topically 3 (three) times daily. 30 g 1  . traMADol (ULTRAM) 50 MG tablet Take 1 tablet (50 mg total) by mouth every 6 (six) hours as needed. 20 tablet 0   No current facility-administered medications on file prior to visit.    ROS  ROS otherwise unremarkable unless listed above.  Physical Examination: BP 122/72 mmHg  Pulse 76   Temp(Src) 98.6 F (37 C) (Oral)  Resp 17  Ht 5' 6.5" (1.689 m)  Wt 144 lb (65.318 kg)  BMI 22.90 kg/m2  SpO2 98%  LMP 06/20/2014 Ideal Body Weight: Weight in (lb) to have BMI = 25: 156.9  Physical Exam  alert, cooperative, oriented 4. PERRLA with normal conjunctiva. Heart rate is normal. No accessory use with breathing for respiratory distress. The  wound has completely closed without any induration or exudate. She has no tenderness upon palpation but she is anxious with exam.  Assessment and Plan:  44 year old female who is here today for follow-up of an abscess status post I and D.   This wound is healing well. I advised her to eat before taking her medication while avoiding dairy  Which should be contributing to her symptoms. Patient asked if she could have a refill of her anxiety medication. Her last refill was a year ago on 30 tablets of Xanax. At this time patient is not taking any kind of SSRI or utilizing any cognitive therapy at this time. I've advised her to get with a primary care at our office or somewhere who will evaluate her anxiety and treat this appropriately.  I advised her of the dangers of benzodiazepines.  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 She was able to take 1 Xanax and had some resolve. I have given her 10 tablets due to her current symptoms and advised her that I will not be refilling. Patient understands and states that she will get an appointment with a primary care doctor, and possibly a psychiatrist.  Encounter for wound care  Adjustment disorder with mixed anxiety and depressed mood - Plan: ALPRAZolam [name]) 0.5 MG tablet  Ivar Drape, PA-C [facility] 6/15/20169:53 PM

## 2014-07-01 NOTE — Telephone Encounter (Signed)
Spoke with pt, she came in yesterday.

## 2014-07-14 ENCOUNTER — Ambulatory Visit (INDEPENDENT_AMBULATORY_CARE_PROVIDER_SITE_OTHER): Payer: Worker's Compensation | Admitting: Family Medicine

## 2014-07-14 ENCOUNTER — Ambulatory Visit: Payer: Worker's Compensation

## 2014-07-14 VITALS — BP 104/66 | HR 74 | Temp 98.8°F | Resp 16 | Ht 66.5 in | Wt 142.0 lb

## 2014-07-14 DIAGNOSIS — M79644 Pain in right finger(s): Secondary | ICD-10-CM | POA: Diagnosis not present

## 2014-07-14 MED ORDER — IBUPROFEN 800 MG PO TABS: 800.0000 mg | ORAL_TABLET | Freq: Four times a day (QID) | ORAL | Status: DC | PRN

## 2014-07-14 NOTE — Patient Instructions (Signed)
Your xray was normal, no fractures.  Please wear the fold over splint while working. You have no work restrictions while wearing this splint. Taking ibuprofen 800 mg every 6 hours will help with the pain.  It is best to give the nail some time and let it fall off as the new one grows in.

## 2014-07-14 NOTE — Progress Notes (Signed)
   Subjective:    Patient ID: Kara Hall, female    DOB: Jan 15, 1971, 44 y.o.   MRN: [id number]  Chief Complaint  Patient presents with  . Hand Pain    finger injury, x 2 weeks ago, right hand, pinky finger   Patient Active Problem List   Diagnosis Date Noted  . Primary thyroid cancer 08/09/2013  . Anxiety 01/12/2013   Prior to Admission medications   Medication Sig Start Date End Date Taking? Authorizing Provider  ALPRAZolam [name]) 0.5 MG tablet Take 1 tablet (0.5 mg total) by mouth 3 (three) times daily as needed for anxiety. 06/30/14  Yes Dorian Heckle English, PA  levothyroxine (SYNTHROID, LEVOTHROID) 125 MCG tablet Take 125 mcg by mouth daily before breakfast.   Yes Historical Provider, MD  mupirocin ointment (BACTROBAN) 2 % Apply 1 application topically 3 (three) times daily. 06/27/14  Yes Shawnee Knapp, MD  traMADol (ULTRAM) 50 MG tablet Take 1 tablet (50 mg total) by mouth every 6 (six) hours as needed. 06/27/14  Yes Shawnee Knapp, MD   Medications, allergies, past medical history, surgical history, family history, social history and problem list reviewed and updated.  HPI  44 yof presents with right little finger injury.   Injured finger at work 2 wks ago. 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 Persistent pain past 2 wks at tip of finger, then hit finger again last night, severe pain. Concerned for fx.   Also concerned as nail appears to be regressing from nailbed. No hematoma.   Review of Systems No fevers, chills.     Objective:   Physical Exam  Constitutional: She is oriented to person, place, and time. She appears well-developed and well-nourished.  Non-toxic appearance. She does not have a sickly appearance. She does not appear ill. No distress.  BP 104/66 mmHg  Pulse 74  Temp(Src) 98.8 F (37.1 C)  Resp 16  Ht 5' 6.5" (1.689 m)  Wt 142 lb (64.411 kg)  BMI 22.58 kg/m2  SpO2 99%  LMP 06/20/2014   Musculoskeletal:  Right pinky finger  --> full rom, normal strength, normal sensation, normal cap refill. TTP over distal tip and DIP joint. No palpable deformity. Nail appears to be away from nailbed but still attached at proximal edge. No subungal hematoma or fungus.   Neurological: She is alert and oriented to person, place, and time.   [facility] reading (PRIMARY) by  Dr. Lorelei Pont. Right pinky findings: Normal.      Assessment & Plan:   44 yof presents with right little finger injury.   Finger pain, right - Plan: DG Finger Little Right --exam, xray normal, no fx  --fold over splint placed --ok to return to work --> no restrictions if wearing fold over --ibuprofen 800 q 6 hrs --rtc if pain persists few wks --instructed to let nail fall off with time  Julieta Gutting, PA-C Physician Assistant-Certified [facility]  07/14/2014 4:25 PM

## 2014-09-17 ENCOUNTER — Ambulatory Visit (INDEPENDENT_AMBULATORY_CARE_PROVIDER_SITE_OTHER): Payer: BLUE CROSS/BLUE SHIELD | Admitting: Family Medicine

## 2014-09-17 ENCOUNTER — Ambulatory Visit (INDEPENDENT_AMBULATORY_CARE_PROVIDER_SITE_OTHER): Payer: BLUE CROSS/BLUE SHIELD

## 2014-09-17 ENCOUNTER — Encounter: Payer: Self-pay | Admitting: Family Medicine

## 2014-09-17 VITALS — BP 138/94 | HR 82 | Temp 98.5°F | Resp 16 | Ht 66.5 in | Wt 146.0 lb

## 2014-09-17 DIAGNOSIS — Z23 Encounter for immunization: Secondary | ICD-10-CM | POA: Diagnosis not present

## 2014-09-17 DIAGNOSIS — M79642 Pain in left hand: Secondary | ICD-10-CM | POA: Diagnosis not present

## 2014-09-17 DIAGNOSIS — G5603 Carpal tunnel syndrome, bilateral upper limbs: Secondary | ICD-10-CM

## 2014-09-17 DIAGNOSIS — G5602 Carpal tunnel syndrome, left upper limb: Secondary | ICD-10-CM

## 2014-09-17 DIAGNOSIS — G5601 Carpal tunnel syndrome, right upper limb: Secondary | ICD-10-CM | POA: Diagnosis not present

## 2014-09-17 MED ORDER — TRAMADOL HCL 50 MG PO TABS: 50.0000 mg | ORAL_TABLET | Freq: Every day | ORAL | Status: DC | PRN

## 2014-09-17 MED ORDER — MELOXICAM 7.5 MG PO TABS: 7.5000 mg | ORAL_TABLET | Freq: Every day | ORAL | Status: DC
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
## 2014-10-19 ENCOUNTER — Encounter: Payer: Self-pay | Admitting: Emergency Medicine

## 2014-11-08 ENCOUNTER — Telehealth: Payer: Self-pay

## 2014-11-09 ENCOUNTER — Ambulatory Visit: Payer: Self-pay | Admitting: Family Medicine

## 2014-11-10 ENCOUNTER — Ambulatory Visit (INDEPENDENT_AMBULATORY_CARE_PROVIDER_SITE_OTHER): Payer: BLUE CROSS/BLUE SHIELD | Admitting: Family Medicine

## 2014-11-10 ENCOUNTER — Encounter: Payer: Self-pay | Admitting: Family Medicine

## 2014-11-10 VITALS — BP 114/80 | HR 71 | Temp 98.5°F | Resp 16 | Ht 66.5 in | Wt 142.8 lb

## 2014-11-10 DIAGNOSIS — M79642 Pain in left hand: Secondary | ICD-10-CM

## 2014-11-10 DIAGNOSIS — F4323 Adjustment disorder with mixed anxiety and depressed mood: Secondary | ICD-10-CM | POA: Diagnosis not present

## 2014-11-10 MED ORDER — MELOXICAM 7.5 MG PO TABS: 7.5000 mg | ORAL_TABLET | Freq: Every day | ORAL | Status: DC

## 2014-11-10 MED ORDER — TRAMADOL HCL 50 MG PO TABS: 50.0000 mg | ORAL_TABLET | Freq: Every day | ORAL | Status: DC | PRN

## 2014-11-10 MED ORDER — ALPRAZOLAM 0.5 MG PO TABS: 0.5000 mg | ORAL_TABLET | Freq: Two times a day (BID) | ORAL | Status: DC | PRN

## 2014-11-10 NOTE — Progress Notes (Signed)
Subjective:    Patient ID: Kara Hall, female    DOB: 12/13/1970, 44 y.o.   MRN: [id number]  HPI Patient presents today for follow up of meds. She continues to have bilateral hand pain. She takes tramadol 1/2 tablet on her work days. She also takes meloxicam 7.5 mg on work days. She has tried heat. She has been considering some braces.   She sees endocrine for follow up of thyroid cancer/hypothyroidism  Past Medical History  Diagnosis Date  . Anemia   . Peripheral vascular disease (HCC)     DVT- per pt. in 2001- had vein removed.   . Anxiety     panic attack, also reports problems with depression, relative to her job & upcoming surgery , relative to life problems - with children & spouse    . Depression   . Muscle strain of hand     wearing home made splint today, L hand  . Back complaints 2013    has had steroid injection   . Refusal of blood transfusions as patient is Jehovah's Witness   . DVT (deep venous thrombosis) ([place]) 2001    RLE  . Thyroid nodule   . Migraine     h/o migraines - as a teenager  & again now with family issues, states she has to lay down & rest in a dark room  (08/28/2013)  . Thyroid cancer [facility])    Past Surgical History  Procedure Laterality Date  . Thrombectomy Right 2001    leg; after 3rd child was born; for deep vein thrombosis  . Tubal ligation  2001  . Thyroidectomy Right 08/28/2013    Procedure: RIGHT THYROID LOBECTOMY POSSIBLE TOTAL THYROIDECTOMY;  Surgeon: Jodi Marble, MD;  Location: [place];  Service: ENT;  Laterality: Right;  . Thyroidectomy Left 09/10/2013    Procedure: COMPLETE LEFT THYROIDECTOMY;  Surgeon: Jodi Marble, MD;  Location: [facility];  Service: ENT;  Laterality: Left;   Family History  Problem Relation Age of Onset  . Heart disease Mother   . Hyperlipidemia Mother   . Heart disease Father   . Hyperlipidemia Father   . Heart disease Brother   . Hyperlipidemia Brother   . Heart disease Brother   . Hyperlipidemia Brother      Review of Systems Per HPI    Objective:   Physical Exam  Constitutional: She is oriented to person, place, and time. She appears well-developed and well-nourished.  HENT:  Head: Normocephalic and atraumatic.  Eyes: Conjunctivae are normal.  Cardiovascular: Normal rate, regular rhythm and normal heart sounds.   Pulmonary/Chest: Effort normal and breath sounds normal.  Musculoskeletal: Normal range of motion.  Bilateral wrists with full ROM, normal strength, good circulation. Negative phalen's/tinel's.   Neurological: She is alert and oriented to person, place, and time.  Skin: Skin is warm and dry.  Psychiatric: Her behavior is normal. Judgment and thought content normal. Her mood appears anxious.  Vitals reviewed.   BP 114/80 mmHg  Pulse 71  Temp(Src) 98.5 F (36.9 C) (Oral)  Resp 16  Ht 5' 6.5" (1.689 m)  Wt 142 lb 12.8 oz (64.774 kg)  BMI 22.71 kg/m2  LMP 11/01/2014 Wt Readings from Last 3 Encounters:  11/10/14 142 lb 12.8 oz (64.774 kg)  09/17/14 146 lb (66.225 kg)  07/14/14 142 lb (64.411 kg)       Assessment & Plan:  1. Left hand pain - traMADol (ULTRAM) 50 MG tablet; Take 1 tablet (50 mg total) by mouth daily  as needed.  Dispense: 30 tablet; Refill: 3 - meloxicam (MOBIC) 7.5 MG tablet; Take 1 tablet (7.5 mg total) by mouth daily.  Dispense: 30 tablet; Refill: 3 - patient has been seen multiple times for this problem. I offered her referral to a hand specialist and she declined, stating she could not afford at this time. I offered to fit her with thumb spica splints but she declined due to cost. Told her she could try to OTC wrist splints for comfort.  2. Adjustment disorder with mixed anxiety and depressed mood - ALPRAZolam (XANAX) 0.5 MG tablet; Take 1 tablet (0.5 mg total) by mouth 2 (two) times daily as needed for anxiety.  Dispense: 30 tablet; Refill: 0 - encouraged regular exercise, good sleep, regular meals.   Clarene Reamer, FNP-BC  [facility]  11/13/2014 3:02 PM

## 2014-11-10 NOTE — Patient Instructions (Signed)
Try an over the counter wrist splint

## 2014-11-23 ENCOUNTER — Encounter: Payer: Self-pay | Admitting: Family Medicine

## 2014-11-23 ENCOUNTER — Ambulatory Visit (INDEPENDENT_AMBULATORY_CARE_PROVIDER_SITE_OTHER): Payer: BLUE CROSS/BLUE SHIELD | Admitting: Family Medicine

## 2014-11-23 VITALS — BP 94/62 | HR 77 | Temp 97.9°F | Resp 16 | Ht 66.0 in | Wt 141.0 lb

## 2014-11-23 DIAGNOSIS — Z13220 Encounter for screening for lipoid disorders: Secondary | ICD-10-CM | POA: Diagnosis not present

## 2014-11-23 DIAGNOSIS — D649 Anemia, unspecified: Secondary | ICD-10-CM | POA: Diagnosis not present

## 2014-11-23 DIAGNOSIS — F4323 Adjustment disorder with mixed anxiety and depressed mood: Secondary | ICD-10-CM | POA: Diagnosis not present

## 2014-11-23 DIAGNOSIS — Z0000 Encounter for general adult medical examination without abnormal findings: Secondary | ICD-10-CM | POA: Diagnosis not present

## 2014-11-23 DIAGNOSIS — Z1389 Encounter for screening for other disorder: Secondary | ICD-10-CM

## 2014-11-23 DIAGNOSIS — Z1231 Encounter for screening mammogram for malignant neoplasm of breast: Secondary | ICD-10-CM

## 2014-11-23 DIAGNOSIS — Z124 Encounter for screening for malignant neoplasm of cervix: Secondary | ICD-10-CM | POA: Diagnosis not present

## 2014-11-23 DIAGNOSIS — S90221A Contusion of right lesser toe(s) with damage to nail, initial encounter: Secondary | ICD-10-CM

## 2014-11-23 LAB — CBC WITH DIFFERENTIAL/PLATELET
BASOS PCT: 0 % (ref 0–1)
Basophils Absolute: 0 10*3/uL (ref 0.0–0.1)
EOS PCT: 3 % (ref 0–5)
Eosinophils Absolute: 0.2 10*3/uL (ref 0.0–0.7)
HCT: 36.6 % (ref 36.0–46.0)
Hemoglobin: 11.9 g/dL — ABNORMAL LOW (ref 12.0–15.0)
Lymphocytes Relative: 31 % (ref 12–46)
Lymphs Abs: 1.9 10*3/uL (ref 0.7–4.0)
MCH: 26.7 pg (ref 26.0–34.0)
MCHC: 32.5 g/dL (ref 30.0–36.0)
MCV: 82.2 fL (ref 78.0–100.0)
MPV: 11.1 fL (ref 8.6–12.4)
Monocytes Absolute: 0.5 10*3/uL (ref 0.1–1.0)
Monocytes Relative: 8 % (ref 3–12)
NEUTROS ABS: 3.6 10*3/uL (ref 1.7–7.7)
Neutrophils Relative %: 58 % (ref 43–77)
PLATELETS: 286 10*3/uL (ref 150–400)
RBC: 4.45 MIL/uL (ref 3.87–5.11)
RDW: 14.5 % (ref 11.5–15.5)
WBC: 6.2 10*3/uL (ref 4.0–10.5)

## 2014-11-23 LAB — LIPID PANEL
CHOLESTEROL: 195 mg/dL (ref 125–200)
HDL: 71 mg/dL (ref >=46)
LDL Cholesterol: 113 mg/dL (ref <130)
Total CHOL/HDL Ratio: 2.7 Ratio (ref <=5.0)
Triglycerides: 57 mg/dL (ref <150)
VLDL: 11 mg/dL (ref <30)

## 2014-11-23 LAB — COMPREHENSIVE METABOLIC PANEL
ALBUMIN: 4.2 g/dL (ref 3.6–5.1)
ALK PHOS: 44 U/L (ref 33–115)
ALT: 7 U/L (ref 6–29)
AST: 13 U/L (ref 10–30)
BILIRUBIN TOTAL: 0.7 mg/dL (ref 0.2–1.2)
BUN: 13 mg/dL (ref 7–25)
CO2: 23 mmol/L (ref 20–31)
CREATININE: 0.48 mg/dL — AB (ref 0.50–1.10)
Calcium: 8.8 mg/dL (ref 8.6–10.2)
Chloride: 104 mmol/L (ref 98–110)
Glucose, Bld: 74 mg/dL (ref 65–99)
Potassium: 3.9 mmol/L (ref 3.5–5.3)
Sodium: 139 mmol/L (ref 135–146)
TOTAL PROTEIN: 6.9 g/dL (ref 6.1–8.1)

## 2014-11-23 NOTE — Progress Notes (Signed)
Subjective:    Patient ID: Kara Hall, female    DOB: July 12, 1970, 44 y.o.   MRN: [id number]  HPI This is a pleasant 44 yo female who presents today for CPE. She is married and has 2 children. 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 She has a history of thyroid cancer and had a partial thyroidectomy 8/15. Her hypothyroidism is managed by endocrine and she is on stable dose of synthroid.   Last CPE- several years Mammo- never Pap- several years Tdap- unsure Flu- annual Eye- not regular Dental- not regular Exercise- yoga and very active job  Past Medical History  Diagnosis Date  . Anemia   . Peripheral vascular disease (HCC)     DVT- per pt. in 2001- had vein removed.   . Anxiety     panic attack, also reports problems with depression, relative to her job & upcoming surgery , relative to life problems - with children & spouse    . Depression   . Muscle strain of hand     wearing home made splint today, L hand  . Back complaints 2013    has had steroid injection   . Refusal of blood transfusions as patient is Jehovah's Witness   . DVT (deep venous thrombosis) ([place]) 2001    RLE  . Thyroid nodule   . Migraine     h/o migraines - as a teenager  & again now with family issues, states she has to lay down & rest in a dark room  (08/28/2013)  . Thyroid cancer [facility])    Past Surgical History  Procedure Laterality Date  . Thrombectomy Right 2001    leg; after 3rd child was born; for deep vein thrombosis  . Tubal ligation  2001  . Thyroidectomy Right 08/28/2013    Procedure: RIGHT THYROID LOBECTOMY POSSIBLE TOTAL THYROIDECTOMY;  Surgeon: Jodi Marble, MD;  Location: [place];  Service: ENT;  Laterality: Right;  . Thyroidectomy Left 09/10/2013    Procedure: COMPLETE LEFT THYROIDECTOMY;  Surgeon: Jodi Marble, MD;  Location: [facility] OR;  Service: ENT;  Laterality: Left;   Family History  Problem Relation Age of Onset  . Heart disease Mother   .  Hyperlipidemia Mother   . Heart disease Father   . Hyperlipidemia Father   . Diabetes Father   . Heart disease Brother   . Hyperlipidemia Brother   . Hypertension Brother   . Heart disease Brother   . Hyperlipidemia Brother    Social History  Substance Use Topics  . Smoking status: Never Smoker   . Smokeless tobacco: Never Used  . Alcohol Use: No     Review of Systems  Constitutional: Positive for fatigue.  Eyes: Negative.   Respiratory: Positive for cough.   Cardiovascular: Negative.   Gastrointestinal: Positive for diarrhea and constipation.  Endocrine: Positive for heat intolerance.  Genitourinary: Positive for menstrual problem (more premenstual symptoms, irregular periods, lightller flow. ).  Musculoskeletal: Positive for myalgias, back pain, joint swelling, arthralgias, neck pain and neck stiffness.  Skin: Positive for color change (right great toenail with bruise for several months. She thinks this might be related to kicking heavy boxes at work. She is concerned about melanoma.).  Allergic/Immunologic: Negative.   Neurological: Positive for headaches.  Hematological: Negative.   Psychiatric/Behavioral: Positive for sleep disturbance and dysphoric mood. The patient is nervous/anxious.        Has taken citalopram in the past but felt that she was  not connected.      Objective:   Physical Exam Physical Exam  Constitutional: She is oriented to person, place, and time. She appears well-developed and well-nourished. No distress.  HENT:  Head: Normocephalic and atraumatic.  Right Ear: External ear normal.  Left Ear: External ear normal.  Nose: Nose normal.  Mouth/Throat: Oropharynx is clear and moist. No oropharyngeal exudate.  Eyes: Conjunctivae are normal. Pupils are equal, round, and reactive to light.  Neck: Normal range of motion. Neck supple. No JVD present. No thyromegaly present.  Cardiovascular: Normal rate, regular rhythm, normal heart sounds and intact  distal pulses.   Pulmonary/Chest: Effort normal and breath sounds normal. Right breast exhibits no inverted nipple, no mass, no nipple discharge, no skin change and no tenderness. Left breast exhibits no inverted nipple, no mass, no nipple discharge, no skin change and no tenderness. Breasts are symmetrical.  Abdominal: Soft. Bowel sounds are normal. She exhibits no distension and no mass. There is no tenderness. There is no rebound and no guarding.  Genitourinary: Vagina normal. Pelvic exam was performed with patient supine. There is no rash, tenderness, lesion or injury on the right labia. There is no rash, tenderness, lesion or injury on the left labia. Small amount, white vaginal discharge found.  Musculoskeletal: Normal range of motion. She exhibits no edema or tenderness.  Lymphadenopathy:    She has no cervical adenopathy.  Neurological: She is alert and oriented to person, place, and time. She has normal reflexes.  Skin: Skin is warm and dry. She is not diaphoretic. Right great toenail with bruise on medial aspect. There is normal coloration distal to bruising. Psychiatric: She has a normal mood and affect. Her behavior is normal. Judgment and thought content normal.  Vitals reviewed. BP 94/62 mmHg  Pulse 77  Temp(Src) 97.9 F (36.6 C)  Resp 16  Ht 5\' 6"  (1.676 m)  Wt 141 lb (63.957 kg)  BMI 22.77 kg/m2  LMP 11/01/2014 Depression screen [facility] 2/9 11/10/2014 09/17/2014 07/14/2014 06/27/2014 05/04/2014  Decreased Interest 0 0 0 0 0  Down, Depressed, Hopeless 0 0 0 0 0  PHQ - 2 Score 0 0 0 0 0      Assessment & Plan:  1. Annual physical exam - encouraged healthy habits, regular sleep, regular meals, exercise, recreation  2. Adjustment disorder with mixed anxiety and depressed mood - patient currently using prn xanax, have discussed possibly adding SSRI at low dose, patient will consider - recommended therapy for symptoms, but patient feels like she doesn't have the time or financial  resources  3. Toenail bruise, right, initial encounter - seems to be growing out, reassured her and told her to monitor for next month and if normal nail growing, no need to worry  4. Anemia, unspecified anemia type - CBC with Differential/Platelet  5. Screening for lipid disorders - Lipid panel  6. Screening for nephropathy - Comprehensive metabolic panel  7. Screening for cervical cancer - Pap IG, CT/NG w/ reflex HPV when ASC-U  8. Encounter for screening mammogram for breast cancer - MM Digital Screening; Future  - follow up 6 months  Clarene Reamer, FNP-BC  [facility]  11/25/2014 9:41 AM

## 2014-11-23 NOTE — Patient Instructions (Signed)
For your mood swings and premenstrual symptoms consider a low dose antidepressant or hormone therapy Just let me know Make time for yourself- have fun, exercise, get good quality sleep

## 2014-11-24 ENCOUNTER — Other Ambulatory Visit: Payer: Self-pay

## 2014-11-24 DIAGNOSIS — Z1231 Encounter for screening mammogram for malignant neoplasm of breast: Secondary | ICD-10-CM

## 2014-11-25 LAB — PAP IG, CT-NG, RFX HPV ASCU
Chlamydia Probe Amp: NEGATIVE
GC Probe Amp: NEGATIVE

## 2014-12-17 ENCOUNTER — Ambulatory Visit
Admission: RE | Admit: 2014-12-17 | Discharge: 2014-12-17 | Disposition: A | Discharge status: Home / Self Care | Payer: BLUE CROSS/BLUE SHIELD | Source: Ambulatory Visit | Attending: Family Medicine | Admitting: Family Medicine

## 2014-12-17 DIAGNOSIS — Z1231 Encounter for screening mammogram for malignant neoplasm of breast: Secondary | ICD-10-CM

## 2014-12-22 ENCOUNTER — Other Ambulatory Visit: Payer: Self-pay

## 2014-12-22 ENCOUNTER — Other Ambulatory Visit: Payer: Self-pay | Admitting: Family Medicine

## 2015-01-17 ENCOUNTER — Ambulatory Visit: Payer: Self-pay | Admitting: Family Medicine

## 2015-01-18 ENCOUNTER — Other Ambulatory Visit: Payer: Self-pay | Admitting: Family Medicine

## 2015-01-18 ENCOUNTER — Telehealth: Payer: Self-pay | Admitting: *Deleted

## 2015-01-18 ENCOUNTER — Telehealth: Payer: Self-pay

## 2015-01-18 DIAGNOSIS — F4323 Adjustment disorder with mixed anxiety and depressed mood: Secondary | ICD-10-CM

## 2015-01-18 MED ORDER — ALPRAZOLAM 0.5 MG PO TABS: 0.5000 mg | ORAL_TABLET | Freq: Two times a day (BID) | ORAL | Status: DC | PRN

## 2015-01-18 NOTE — Telephone Encounter (Signed)
Faxed prescription for Alprazolam 0.5 mg to patient's pharmacy, per Clarene Reamer FNP. Confirmation page received at 3:01 pm.

## 2015-01-18 NOTE — Telephone Encounter (Signed)
Pt states she wasn't able to make her appt since her whole family is sick with the stomach virus, all she need is her XANAX 0.5 MG and her TRAMADOL 50 MG. Please call [phone]

## 2015-01-18 NOTE — Telephone Encounter (Signed)
Patient notified

## 2015-01-18 NOTE — Telephone Encounter (Signed)
Please call patient and tell her that her xanax prescription has been faxed to her pharmacy and she should have additional refills on her tramadol- it was written for 3 refills 10/16.

## 2015-03-25 ENCOUNTER — Ambulatory Visit (INDEPENDENT_AMBULATORY_CARE_PROVIDER_SITE_OTHER): Payer: BLUE CROSS/BLUE SHIELD | Admitting: Family Medicine

## 2015-03-25 VITALS — BP 108/60 | HR 83 | Temp 98.2°F | Resp 18 | Ht 66.75 in | Wt 128.0 lb

## 2015-03-25 DIAGNOSIS — J069 Acute upper respiratory infection, unspecified: Secondary | ICD-10-CM | POA: Diagnosis not present

## 2015-03-25 DIAGNOSIS — J301 Allergic rhinitis due to pollen: Secondary | ICD-10-CM | POA: Diagnosis not present

## 2015-03-25 DIAGNOSIS — J0101 Acute recurrent maxillary sinusitis: Secondary | ICD-10-CM

## 2015-03-25 LAB — POCT CBC
Granulocyte percent: 75.1 %G (ref 37–80)
HCT, POC: 32.9 % — AB (ref 37.7–47.9)
HEMOGLOBIN: 11.4 g/dL — AB (ref 12.2–16.2)
Lymph, poc: 1 (ref 0.6–3.4)
MCH: 27.2 pg (ref 27–31.2)
MCHC: 34.6 g/dL (ref 31.8–35.4)
MCV: 78.5 fL — AB (ref 80–97)
MID (cbc): 0.7 (ref 0–0.9)
MPV: 8.2 fL (ref 0–99.8)
PLATELET COUNT, POC: 220 10*3/uL (ref 142–424)
POC Granulocyte: 5.2 (ref 2–6.9)
POC LYMPH PERCENT: 14.6 %L (ref 10–50)
POC MID %: 10.3 % (ref 0–12)
RBC: 4.19 M/uL (ref 4.04–5.48)
RDW, POC: 14.6 %
WBC: 6.9 10*3/uL (ref 4.6–10.2)

## 2015-03-25 LAB — POCT INFLUENZA A/B
Influenza A, POC: NEGATIVE
Influenza B, POC: NEGATIVE

## 2015-03-25 MED ORDER — IPRATROPIUM BROMIDE 0.03 % NA SOLN: 2.0000 | Freq: Two times a day (BID) | NASAL | Status: DC

## 2015-03-25 MED ORDER — AMOXICILLIN 500 MG PO TABS: 1000.0000 mg | ORAL_TABLET | Freq: Two times a day (BID) | ORAL | Status: DC

## 2015-03-25 NOTE — Patient Instructions (Signed)

## 2015-03-25 NOTE — Progress Notes (Signed)
Subjective:    Patient ID: Kara Hall, female    DOB: 1970/09/09, 45 y.o.   MRN: [id number]  03/25/2015  Sore Throat; Ear Pain; Nasal Congestion; Cough; and Medication Refill   HPI This 45 y.o. female presents for evaluation of sinus congestion. Onset two weeks ago with allergy symptoms with acute worsening in past week.  No fever but +chills/sweats but chronic.  +HA; B ear pain two days ago.  +rhinorrhea; +nasal congestion; +PND.  +coughing. No sputum.  No SOB; no n/v/d.  Went home early yesterday.  Called out this morning. Woke up with horrible PND which was causing sore throat.  Has been taking Ibuprofen 800mg  this morning.  +nasal congestion green and clear.  Bought Loratadine 10mg  daily with some improvement; took another dose this morning.     Review of Systems  Constitutional: Negative for fever, chills, diaphoresis and fatigue.  Eyes: Negative for visual disturbance.  Respiratory: Negative for cough and shortness of breath.   Cardiovascular: Negative for chest pain, palpitations and leg swelling.  Gastrointestinal: Negative for nausea, vomiting, abdominal pain, diarrhea and constipation.  Endocrine: Negative for cold intolerance, heat intolerance, polydipsia, polyphagia and polyuria.  Neurological: Negative for dizziness, tremors, seizures, syncope, facial asymmetry, speech difficulty, weakness, light-headedness, numbness and headaches.    Past Medical History  Diagnosis Date  . Anemia   . Peripheral vascular disease (HCC)     DVT- per pt. in 2001- had vein removed.   . Anxiety     panic attack, also reports problems with depression, relative to her job & upcoming surgery , relative to life problems - with children & spouse    . Depression   . Muscle strain of hand     wearing home made splint today, L hand  . Back complaints 2013    has had steroid injection   . Refusal of blood transfusions as patient is Jehovah's Witness   . DVT (deep venous thrombosis) ([place]) 2001      RLE  . Thyroid nodule   . Migraine     h/o migraines - as a teenager  & again now with family issues, states she has to lay down & rest in a dark room  (08/28/2013)  . Thyroid cancer [facility])    Past Surgical History  Procedure Laterality Date  . Thrombectomy Right 2001    leg; after 3rd child was born; for deep vein thrombosis  . Tubal ligation  2001  . Thyroidectomy Right 08/28/2013    Procedure: RIGHT THYROID LOBECTOMY POSSIBLE TOTAL THYROIDECTOMY;  Surgeon: Jodi Marble, MD;  Location: [place];  Service: ENT;  Laterality: Right;  . Thyroidectomy Left 09/10/2013    Procedure: COMPLETE LEFT THYROIDECTOMY;  Surgeon: Jodi Marble, MD;  Location: [place];  Service: ENT;  Laterality: Left;   Allergies  Allergen Reactions  . Sulfa Antibiotics Other (See Comments)    Severe bladder, and kidney infection  . Trazodone And Nefazodone Other (See Comments)    Headache every am    Social History   Social History  . Marital Status: Married    Spouse Name: [name]  . Number of Children: 3  . Years of Education: 12+   Occupational History  . [employer]   . Cosmetologist   .     Social History Main Topics  . Smoking status: Never Smoker   . Smokeless tobacco: Never Used  . Alcohol Use: No  . Drug Use: No  . Sexual Activity:    Partners: Male  Other Topics Concern  . Not on file   Social History Narrative   Lives with her husband and 3 children.   Family History  Problem Relation Age of Onset  . Heart disease Mother   . Hyperlipidemia Mother   . Heart disease Father   . Hyperlipidemia Father   . Diabetes Father   . Heart disease Brother   . Hyperlipidemia Brother   . Hypertension Brother   . Heart disease Brother   . Hyperlipidemia Brother        Objective:    BP 108/60 mmHg  Pulse 83  Temp(Src) 98.2 F (36.8 C) (Oral)  Resp 18  Ht 5' 6.75" (1.695 m)  Wt 128 lb (58.06 kg)  BMI 20.21 kg/m2  SpO2 98% Physical Exam  Constitutional: She is oriented to person,  place, and time. She appears well-developed and well-nourished. No distress.  HENT:  Head: Normocephalic and atraumatic.  Right Ear: External ear normal.  Left Ear: External ear normal.  Nose: Nose normal.  Mouth/Throat: Oropharynx is clear and moist.  Eyes: Conjunctivae and EOM are normal. Pupils are equal, round, and reactive to light.  Neck: Normal range of motion. Neck supple. Carotid bruit is not present. No thyromegaly present.  Cardiovascular: Normal rate, regular rhythm, normal heart sounds and intact distal pulses.  Exam reveals no gallop and no friction rub.   No murmur heard. Pulmonary/Chest: Effort normal and breath sounds normal. She has no wheezes. She has no rales.  Abdominal: Soft. Bowel sounds are normal. She exhibits no distension and no mass. There is no tenderness. There is no rebound and no guarding.  Lymphadenopathy:    She has no cervical adenopathy.  Neurological: She is alert and oriented to person, place, and time. No cranial nerve deficit.  Skin: Skin is warm and dry. No rash noted. She is not diaphoretic. No erythema. No pallor.  Psychiatric: She has a normal mood and affect. Her behavior is normal.   Results for orders placed or performed in visit on 03/25/15  POCT CBC  Result Value Ref Range   WBC 6.9 4.6 - 10.2 K/uL   Lymph, poc 1.0 0.6 - 3.4   POC LYMPH PERCENT 14.6 10 - 50 %L   MID (cbc) 0.7 0 - 0.9   POC MID % 10.3 0 - 12 %M   POC Granulocyte 5.2 2 - 6.9   Granulocyte percent 75.1 37 - 80 %G   RBC 4.19 4.04 - 5.48 M/uL   Hemoglobin 11.4 (A) 12.2 - 16.2 g/dL   HCT, POC 32.9 (A) 37.7 - 47.9 %   MCV 78.5 (A) 80 - 97 fL   MCH, POC 27.2 27 - 31.2 pg   MCHC 34.6 31.8 - 35.4 g/dL   RDW, POC 14.6 %   Platelet Count, POC 220 142 - 424 K/uL   MPV 8.2 0 - 99.8 fL  POCT Influenza A/B  Result Value Ref Range   Influenza A, POC Negative Negative   Influenza B, POC Negative Negative       Assessment & Plan:   1. Seasonal allergic rhinitis due to  pollen   2. Viral URI     Orders Placed This Encounter  Procedures  . POCT CBC  . POCT Influenza A/B   Meds ordered this encounter  Medications  . amoxicillin (AMOXIL) 500 MG tablet    Sig: Take 2 tablets (1,000 mg total) by mouth 2 (two) times daily.    Dispense:  40 tablet  Refill:  0  . ipratropium (ATROVENT) 0.03 % nasal spray    Sig: Place 2 sprays into the nose 2 (two) times daily.    Dispense:  30 mL    Refill:  11    No Follow-up on file.    Kristi Elayne Guerin, M.D. [facility] [address] [place], [place]  [address] [phone] phone [phone] fax

## 2015-05-25 ENCOUNTER — Telehealth: Payer: Self-pay

## 2015-05-25 NOTE — Telephone Encounter (Signed)
Pt is needing a refill on tramadol   [facility] [phone]

## 2015-05-27 ENCOUNTER — Other Ambulatory Visit: Payer: Self-pay | Admitting: Family Medicine

## 2015-05-27 DIAGNOSIS — M79642 Pain in left hand: Secondary | ICD-10-CM

## 2015-05-27 MED ORDER — TRAMADOL HCL 50 MG PO TABS: 50.0000 mg | ORAL_TABLET | Freq: Every day | ORAL | Status: DC | PRN

## 2015-05-29 NOTE — Telephone Encounter (Signed)
Prescription faxed to pharmacy 05/27/15.

## 2015-07-08 DIAGNOSIS — E890 Postprocedural hypothyroidism: Secondary | ICD-10-CM | POA: Diagnosis not present

## 2015-07-08 DIAGNOSIS — Z85850 Personal history of malignant neoplasm of thyroid: Secondary | ICD-10-CM | POA: Diagnosis not present

## 2015-07-08 DIAGNOSIS — L608 Other nail disorders: Secondary | ICD-10-CM | POA: Diagnosis not present

## 2015-08-08 ENCOUNTER — Ambulatory Visit (INDEPENDENT_AMBULATORY_CARE_PROVIDER_SITE_OTHER): Payer: BLUE CROSS/BLUE SHIELD | Admitting: Physician Assistant

## 2015-08-08 VITALS — BP 106/68 | HR 78 | Temp 98.4°F | Resp 18 | Ht 66.75 in | Wt 135.0 lb

## 2015-08-08 DIAGNOSIS — J029 Acute pharyngitis, unspecified: Secondary | ICD-10-CM | POA: Diagnosis not present

## 2015-08-08 DIAGNOSIS — M79642 Pain in left hand: Secondary | ICD-10-CM

## 2015-08-08 DIAGNOSIS — R05 Cough: Secondary | ICD-10-CM

## 2015-08-08 DIAGNOSIS — R059 Cough, unspecified: Secondary | ICD-10-CM

## 2015-08-08 LAB — POCT RAPID STREP A (OFFICE): Rapid Strep A Screen: NEGATIVE

## 2015-08-08 MED ORDER — HYDROCODONE-HOMATROPINE 5-1.5 MG/5ML PO SYRP: 5.0000 mL | ORAL_SOLUTION | Freq: Four times a day (QID) | ORAL | 0 refills | Status: DC | PRN

## 2015-08-08 MED ORDER — BENZONATATE 100 MG PO CAPS: 100.0000 mg | ORAL_CAPSULE | Freq: Three times a day (TID) | ORAL | 0 refills | Status: DC | PRN

## 2015-08-08 MED ORDER — TRAMADOL HCL 50 MG PO TABS: 50.0000 mg | ORAL_TABLET | Freq: Every day | ORAL | 0 refills | Status: DC | PRN

## 2015-08-08 NOTE — Patient Instructions (Addendum)
-   We will treat this as a respiratory viral infection.  - I recommend you rest, drink plenty of fluids, eat light meals including soups.  - You may use cough syrup at night for your cough and sore throat, Tessalon pearls during the day. Be aware that cough syrup can definitely make you drowsy and sleepy so do not drive or operate any heavy machinery if it is affecting you during the day.  - You may also use Tylenol or ibuprofen over-the-counter for your sore throat.  - Please let me know if you are not seeing any improvement or get worse in 7 days.  -I will notify you with your strep culture results and treat accordingly.      IF you received an x-ray today, you will receive an invoice from [facility]. Please contact [facility] at [phone] with questions or concerns regarding your invoice.   IF you received labwork today, you will receive an invoice from [employer]. Please contact [facility] at [phone] with questions or concerns regarding your invoice.   Our billing staff will not be able to assist you with questions regarding bills from these companies.  You will be contacted with the lab results as soon as they are available. The fastest way to get your results is to activate your My Chart account. Instructions are located on the last page of this paperwork. If you have not heard from [place] regarding the results in 2 weeks, please contact this office.

## 2015-08-08 NOTE — Progress Notes (Signed)
Kara Hall  MRN: [id number] DOB: 1970/10/02  Subjective:  Kara Hall is a 45 y.o. female seen in office today for a chief complaint of constant sore throat x 2 weeks and medication refill. Has associated throat itching, cough, and fatigue. Denies fever and chills. Has tried tylenol and tramadol for pain with mild relief. Pt has history of strep throat. Her son was treated 3 weeks ago for strep.   Of note, pt mentions that [facility] treats her chronic carpal tunnel syndrome with tramadol. She received 2 months of medication in May and is about to run out so she wants to know if we can prescribe her enough to get to November.   Review of Systems  HENT: Positive for congestion, ear pain and trouble swallowing. Negative for sinus pressure.   Eyes: Negative for itching.  Respiratory: Negative for shortness of breath and wheezing.   Gastrointestinal: Positive for abdominal pain (baseline for patient). Negative for diarrhea, nausea and vomiting.  Musculoskeletal: Negative for myalgias.    Patient Active Problem List   Diagnosis Date Noted  . Primary thyroid cancer ([place]) 08/09/2013  . Anxiety 01/12/2013    Current Outpatient Prescriptions on File Prior to Visit  Medication Sig Dispense Refill  . ALPRAZolam (XANAX) 0.5 MG tablet Take 1 tablet (0.5 mg total) by mouth 2 (two) times daily as needed for anxiety. 30 tablet 1  . levothyroxine (SYNTHROID, LEVOTHROID) 125 MCG tablet Take 125 mcg by mouth daily before breakfast.    . meloxicam (MOBIC) 7.5 MG tablet Take 1 tablet (7.5 mg total) by mouth daily. 30 tablet 3  . ibuprofen (ADVIL,MOTRIN) 800 MG tablet Take 1 tablet (800 mg total) by mouth every 6 (six) hours as needed. 28 tablet 1  . ipratropium (ATROVENT) 0.03 % nasal spray Place 2 sprays into the nose 2 (two) times daily. (Patient not taking: Reported on 08/08/2015) 30 mL 11  . mupirocin ointment (BACTROBAN) 2 % Apply 1 application topically 3 (three) times daily. (Patient not  taking: Reported on 08/08/2015) 30 g 1   No current facility-administered medications on file prior to visit.     Allergies  Allergen Reactions  . Sulfa Antibiotics Other (See Comments)    Severe bladder, and kidney infection  . Trazodone And Nefazodone Other (See Comments)    Headache every am    Objective:  BP 106/68   Pulse 78   Temp 98.4 F (36.9 C) (Oral)   Resp 18   Ht 5' 6.75" (1.695 m)   Wt 135 lb (61.2 kg)   LMP 07/09/2015   SpO2 98%   BMI 21.30 kg/m   Physical Exam  Constitutional: She is oriented to person, place, and time and well-developed, well-nourished, and in no distress.  HENT:  Head: Normocephalic and atraumatic.  Right Ear: Hearing, tympanic membrane, external ear and ear canal normal.  Left Ear: Hearing, tympanic membrane, external ear and ear canal normal.  Nose: Mucosal edema present.  Mouth/Throat: Uvula is midline. Oropharyngeal exudate and posterior oropharyngeal erythema present.  Eyes: Conjunctivae are normal. Pupils are equal, round, and reactive to light.  Neck: Normal range of motion.  Cardiovascular: Normal rate, regular rhythm and normal heart sounds.   Pulmonary/Chest: Effort normal and breath sounds normal.  Lymphadenopathy:       Head (right side): No submental, no submandibular, no tonsillar, no preauricular, no posterior auricular and no occipital adenopathy present.       Head (left side): No submental, no submandibular, no tonsillar, no preauricular,  no posterior auricular and no occipital adenopathy present.    She has cervical adenopathy.       Right cervical: Superficial cervical adenopathy present. No deep cervical and no posterior cervical adenopathy present.      Left cervical: No superficial cervical, no deep cervical and no posterior cervical adenopathy present.       Right: No supraclavicular adenopathy present.       Left: No supraclavicular adenopathy present.  Neurological: She is alert and oriented to person, place, and  time. Gait normal.  Skin: Skin is warm and dry.  Psychiatric: Affect normal.  Vitals reviewed.  Results for orders placed or performed in visit on 08/08/15 (from the past 24 hour(s))  POCT rapid strep A     Status: None   Collection Time: 08/08/15  6:06 PM  Result Value Ref Range   Rapid Strep A Screen Negative Negative     Assessment and Plan :  1. Sore throat -Rest and salt water gargles  - POCT rapid strep A - Culture, Group A Strep  2. Left hand pain -Given one refill for tramadol, but informed pt that this is the last refill until she establishes care with another PCP as NP Carlean Purl is leaving the practice and will no longer be able to manage this issue.  - traMADol (ULTRAM) 50 MG tablet; Take 1 tablet (50 mg total) by mouth daily as needed.  Dispense: 30 tablet; Refill: 0  3. Cough - HYDROcodone-homatropine (HYCODAN) 5-1.5 MG/5ML syrup; Take 5 mLs by mouth every 6 (six) hours as needed for cough.  Dispense: 120 mL; Refill: 0 - benzonatate (TESSALON) 100 MG capsule; Take 1-2 capsules (100-200 mg total) by mouth 3 (three) times daily as needed for cough.  Dispense: 40 capsule; Refill: 0   Tenna Delaine PA-C  [facility] 08/08/2015 7:09 PM

## 2015-08-10 LAB — CULTURE, GROUP A STREP: ORGANISM ID, BACTERIA: NORMAL

## 2015-09-13 DIAGNOSIS — R509 Fever, unspecified: Secondary | ICD-10-CM | POA: Diagnosis not present

## 2015-09-13 DIAGNOSIS — J029 Acute pharyngitis, unspecified: Secondary | ICD-10-CM | POA: Diagnosis not present

## 2015-09-13 DIAGNOSIS — N3001 Acute cystitis with hematuria: Secondary | ICD-10-CM | POA: Diagnosis not present

## 2015-09-18 DIAGNOSIS — B370 Candidal stomatitis: Secondary | ICD-10-CM | POA: Diagnosis not present

## 2015-09-18 DIAGNOSIS — K120 Recurrent oral aphthae: Secondary | ICD-10-CM | POA: Diagnosis not present

## 2015-10-10 ENCOUNTER — Ambulatory Visit (INDEPENDENT_AMBULATORY_CARE_PROVIDER_SITE_OTHER): Payer: BLUE CROSS/BLUE SHIELD | Admitting: Physician Assistant

## 2015-10-10 VITALS — BP 140/94 | HR 81 | Temp 98.6°F | Ht 66.75 in | Wt 137.0 lb

## 2015-10-10 DIAGNOSIS — M79642 Pain in left hand: Secondary | ICD-10-CM | POA: Diagnosis not present

## 2015-10-10 DIAGNOSIS — F4323 Adjustment disorder with mixed anxiety and depressed mood: Secondary | ICD-10-CM

## 2015-10-10 DIAGNOSIS — Z23 Encounter for immunization: Secondary | ICD-10-CM

## 2015-10-10 DIAGNOSIS — M25532 Pain in left wrist: Secondary | ICD-10-CM

## 2015-10-10 MED ORDER — TRAMADOL HCL 50 MG PO TABS: 50.0000 mg | ORAL_TABLET | Freq: Every day | ORAL | 0 refills | Status: DC | PRN

## 2015-10-10 MED ORDER — ALPRAZOLAM 0.5 MG PO TABS: 0.5000 mg | ORAL_TABLET | Freq: Two times a day (BID) | ORAL | 0 refills | Status: DC | PRN

## 2015-10-10 MED ORDER — SERTRALINE HCL 50 MG PO TABS: 50.0000 mg | ORAL_TABLET | Freq: Every day | ORAL | 3 refills | Status: DC

## 2015-10-10 MED ORDER — MELOXICAM 7.5 MG PO TABS: 7.5000 mg | ORAL_TABLET | Freq: Every day | ORAL | 3 refills | Status: DC

## 2015-10-10 NOTE — Patient Instructions (Signed)
When you start the sertraline (Zoloft) take only 1/2 tablet daily for the first week, to allow your body to adjust. Hopefully, you won't need as much alprazolam once you get on the sertraline.  Wear the wrist splint as much as you can. And use the meloxicam. Hopefully, these will minimize the amount of tramadol you need!      IF you received an x-ray today, you will receive an invoice from [facility]. Please contact [facility] at [phone] with questions or concerns regarding your invoice.   IF you received labwork today, you will receive an invoice from [employer]. Please contact [facility] at [phone] with questions or concerns regarding your invoice.   Our billing staff will not be able to assist you with questions regarding bills from these companies.  You will be contacted with the lab results as soon as they are available. The fastest way to get your results is to activate your My Chart account. Instructions are located on the last page of this paperwork. If you have not heard from [place] regarding the results in 2 weeks, please contact this office.

## 2015-10-10 NOTE — Progress Notes (Signed)
Patient ID: Kara Hall, female    DOB: [birth year], 45 y.o.   MRN: [id number]  PCP: Jenny Reichmann, MD  Subjective:   Chief Complaint  Patient presents with  . Medication Refill    Xanax, tramadol/ under stress caring for father    HPI Presents for medication refills: alprazolam and tramadol.  Recently seen at another clinic with sore throat and fever. She was treated empirically for Strep throat, and then got thrush. Ultimately, the culture was negative for streptococcal infection.  Caring for her father, who is very ill. She and her brothers are sharing the burden of providing the care. He is a smoker (2 ppd) and an alcoholic, only recently sober. He is falling frequently. No longer drives. Incontinent of stool and urine.  She previously used a wrist splint for the LEFT wrist, but her daughter uses it now.  Finances are very tight.   Review of Systems  Constitutional: Positive for fatigue. Negative for chills and fever.  HENT: Negative.   Eyes: Negative.   Respiratory: Negative.   Cardiovascular: Negative.   Gastrointestinal: Negative.   Endocrine: Negative.   Genitourinary: Negative.   Musculoskeletal: Positive for arthralgias and myalgias. Negative for back pain, gait problem, joint swelling, neck pain and neck stiffness.  Skin: Negative.   Neurological: Negative for dizziness and headaches.  Hematological: Negative.   Psychiatric/Behavioral: Positive for decreased concentration, dysphoric mood and sleep disturbance. Negative for self-injury and suicidal ideas. The patient is nervous/anxious.        Patient Active Problem List   Diagnosis Date Noted  . Primary thyroid cancer ([place]) 08/09/2013  . Anxiety 01/12/2013     Prior to Admission medications   Medication Sig Start Date End Date Taking? Authorizing Provider  ALPRAZolam [name]) 0.5 MG tablet Take 1 tablet (0.5 mg total) by mouth 2 (two) times daily as needed for anxiety. 01/18/15  Yes Elby Beck,  FNP  ibuprofen (ADVIL,MOTRIN) 800 MG tablet Take 1 tablet (800 mg total) by mouth every 6 (six) hours as needed. 07/14/14  Yes Todd McVeigh, PA  levothyroxine (SYNTHROID, LEVOTHROID) 125 MCG tablet Take 125 mcg by mouth daily before breakfast.   Yes Historical Provider, MD  meloxicam (MOBIC) 7.5 MG tablet Take 1 tablet (7.5 mg total) by mouth daily. 11/10/14  Yes Elby Beck, FNP  traMADol (ULTRAM) 50 MG tablet Take 1 tablet (50 mg total) by mouth daily as needed. 08/08/15  Yes Leonie Douglas, PA-C     Allergies  Allergen Reactions  . Sulfa Antibiotics Other (See Comments)    Severe bladder, and kidney infection  . Trazodone And Nefazodone Other (See Comments)    Headache every am       Objective:  Physical Exam  Constitutional: She is oriented to person, place, and time. She appears well-developed and well-nourished. She is active and cooperative. No distress.  BP (!) 140/94 (BP Location: Right Arm, Patient Position: Sitting, Cuff Size: Normal)   Pulse 81   Temp 98.6 F (37 C) (Oral)   Ht 5' 6.75" (1.695 m)   Wt 137 lb (62.1 kg)   LMP 10/07/2015   SpO2 98%   BMI 21.62 kg/m   HENT:  Head: Normocephalic and atraumatic.  Right Ear: Hearing normal.  Left Ear: Hearing normal.  Eyes: Conjunctivae are normal. No scleral icterus.  Neck: Normal range of motion. Neck supple. No thyromegaly present.  Cardiovascular: Normal rate, regular rhythm and normal heart sounds.   Pulses:  Radial pulses are 2+ on the right side, and 2+ on the left side.  Pulmonary/Chest: Effort normal and breath sounds normal.  Lymphadenopathy:       Head (right side): No tonsillar, no preauricular, no posterior auricular and no occipital adenopathy present.       Head (left side): No tonsillar, no preauricular, no posterior auricular and no occipital adenopathy present.    She has no cervical adenopathy.       Right: No supraclavicular adenopathy present.       Left: No supraclavicular adenopathy  present.  Neurological: She is alert and oriented to person, place, and time. No sensory deficit.  Skin: Skin is warm, dry and intact. No rash noted. No cyanosis or erythema. Nails show no clubbing.  Psychiatric: She has a normal mood and affect. Her speech is normal and behavior is normal.           Assessment & Plan:   1. Wrist pain, left Refill tramadol, but discussed that it is not the recommended long-term treatment of CTS. She needs to resume use of the wrist splint, and a new one has been provided today. Resume meloxicam. Minimize use of tramadol. - traMADol (ULTRAM) 50 MG tablet; Take 1 tablet (50 mg total) by mouth daily as needed.  Dispense: 30 tablet; Refill: 0 - meloxicam (MOBIC) 7.5 MG tablet; Take 1 tablet (7.5 mg total) by mouth daily.  Dispense: 30 tablet; Refill: 3 - Splint wrist  2. Adjustment disorder with mixed anxiety and depressed mood Add sertraline. Start with 1/2 tablet daily x 1 week, then 1 tablet. Use alprazolam as needed, but again, discussed that this should be rescue, not maintenance therapy. - sertraline (ZOLOFT) 50 MG tablet; Take 1 tablet (50 mg total) by mouth daily.  Dispense: 30 tablet; Refill: 3 - ALPRAZolam (XANAX) 0.5 MG tablet; Take 1 tablet (0.5 mg total) by mouth 2 (two) times daily as needed for anxiety.  Dispense: 30 tablet; Refill: 0  3. Need for influenza vaccination - Flu Vaccine QUAD 36+ mos IM   Fara Chute, PA-C Physician Assistant-Certified [facility]

## 2015-11-28 ENCOUNTER — Ambulatory Visit (INDEPENDENT_AMBULATORY_CARE_PROVIDER_SITE_OTHER): Payer: BLUE CROSS/BLUE SHIELD | Admitting: Physician Assistant

## 2015-11-28 VITALS — BP 120/88 | HR 82 | Temp 98.1°F | Resp 16 | Ht 66.75 in | Wt 140.4 lb

## 2015-11-28 DIAGNOSIS — M25531 Pain in right wrist: Secondary | ICD-10-CM | POA: Insufficient documentation

## 2015-11-28 DIAGNOSIS — F4323 Adjustment disorder with mixed anxiety and depressed mood: Secondary | ICD-10-CM | POA: Insufficient documentation

## 2015-11-28 DIAGNOSIS — M25532 Pain in left wrist: Secondary | ICD-10-CM | POA: Diagnosis not present

## 2015-11-28 MED ORDER — ALPRAZOLAM 0.5 MG PO TABS: 0.5000 mg | ORAL_TABLET | Freq: Two times a day (BID) | ORAL | 0 refills | Status: DC | PRN

## 2015-11-28 MED ORDER — TRAMADOL HCL 50 MG PO TABS: 50.0000 mg | ORAL_TABLET | Freq: Every day | ORAL | 0 refills | Status: DC | PRN
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
## 2015-11-28 NOTE — Patient Instructions (Signed)
     IF you received an x-ray today, you will receive an invoice from [facility]. Please contact [facility] at [phone] with questions or concerns regarding your invoice.   IF you received labwork today, you will receive an invoice from [facility]. Please contact [facility] at [phone] with questions or concerns regarding your invoice.   Our billing staff will not be able to assist you with questions regarding bills from these companies.  You will be contacted with the lab results as soon as they are available. The fastest way to get your results is to activate your My Chart account. Instructions are located on the last page of this paperwork. If you have not heard from us regarding the results in 2 weeks, please contact this office.      

## 2016-01-19 ENCOUNTER — Other Ambulatory Visit: Payer: Self-pay

## 2016-01-19 DIAGNOSIS — M25532 Pain in left wrist: Secondary | ICD-10-CM

## 2016-01-19 NOTE — Telephone Encounter (Signed)
Pt is looking for a refill on tramadol at [facility] number [phone]

## 2016-01-19 NOTE — Telephone Encounter (Signed)
11/28/15 last ov and refill

## 2016-01-20 NOTE — Telephone Encounter (Signed)
I explained to her that she would need to be seen for a refill on her Tramadol. She has to work and is very upset. Pt says she really needs this script-she can't function without it.  I'm just putting a message in. Please advise at [phone]

## 2016-01-21 MED ORDER — TRAMADOL HCL 50 MG PO TABS: 50.0000 mg | ORAL_TABLET | Freq: Every day | ORAL | 0 refills | Status: DC | PRN

## 2016-01-21 NOTE — Telephone Encounter (Signed)
Pt advised ready for pick up.

## 2016-01-21 NOTE — Telephone Encounter (Signed)
Meds ordered this encounter  Medications  . traMADol (ULTRAM) 50 MG tablet    Sig: Take 1 tablet (50 mg total) by mouth daily as needed.    Dispense:  30 tablet    Refill:  0

## 2016-02-13 DIAGNOSIS — Z85850 Personal history of malignant neoplasm of thyroid: Secondary | ICD-10-CM | POA: Diagnosis not present

## 2016-02-13 DIAGNOSIS — E890 Postprocedural hypothyroidism: Secondary | ICD-10-CM | POA: Diagnosis not present

## 2016-02-25 ENCOUNTER — Ambulatory Visit (INDEPENDENT_AMBULATORY_CARE_PROVIDER_SITE_OTHER): Payer: BLUE CROSS/BLUE SHIELD | Admitting: Physician Assistant

## 2016-02-25 ENCOUNTER — Ambulatory Visit (INDEPENDENT_AMBULATORY_CARE_PROVIDER_SITE_OTHER): Payer: BLUE CROSS/BLUE SHIELD

## 2016-02-25 VITALS — BP 105/72 | HR 78 | Temp 98.7°F | Resp 16

## 2016-02-25 DIAGNOSIS — M25472 Effusion, left ankle: Secondary | ICD-10-CM

## 2016-02-25 DIAGNOSIS — S92352A Displaced fracture of fifth metatarsal bone, left foot, initial encounter for closed fracture: Secondary | ICD-10-CM | POA: Diagnosis not present

## 2016-02-25 MED ORDER — TRAMADOL HCL 50 MG PO TABS: 50.0000 mg | ORAL_TABLET | Freq: Four times a day (QID) | ORAL | 0 refills | Status: DC | PRN

## 2016-02-25 NOTE — Patient Instructions (Signed)
Take 400-600 mg of ibuprofen every 8 hours or meloxicam. Okay to take 1000 mg of tylenol as needed.  Okay to take trramadol as needed.  Do not bear weight on the foot.

## 2016-02-25 NOTE — Progress Notes (Signed)
02/25/2016 9:04 AM   DOB: 02/20/1970 / MRN: [id number]  SUBJECTIVE:  Kara Hall is a 46 y.o. female presenting for left ankle pain.  This started last night after she tripped and fell down some stairs.  She rolled her ankle in an inversion type manner.  She complains of both pain and swelling in the ankle joint.  Feels that she is getting worse.  She can not walk on the foot.    She is allergic to sulfa antibiotics and trazodone and nefazodone.   She  has a past medical history of Anemia; Anxiety; Back complaints (2013); Depression; DVT (deep venous thrombosis) ([place]) (2001); Migraine; Muscle strain of hand; Peripheral vascular disease ([place]); Refusal of blood transfusions as patient is Jehovah's Witness; Thyroid cancer ([place]); and Thyroid nodule.     Review of Systems  Constitutional: Negative for fever.  Cardiovascular: Positive for leg swelling. Negative for chest pain and PND.  Musculoskeletal: Positive for falls and joint pain.  Skin: Negative for rash.  Neurological: Negative for dizziness.    The problem list and medications were reviewed and updated by myself where necessary and exist elsewhere in the encounter.   OBJECTIVE:  BP 105/72 (BP Location: Left Arm, Patient Position: Sitting, Cuff Size: Normal)   Pulse 78   Temp 98.7 F (37.1 C) (Oral)   Resp 16   SpO2 98%   Physical Exam  Constitutional: She is oriented to person, place, and time. She appears well-nourished. No distress.  Eyes: EOM are normal. Pupils are equal, round, and reactive to light.  Cardiovascular: Normal rate.   Pulmonary/Chest: Effort normal.  Abdominal: She exhibits no distension.  Musculoskeletal: Normal range of motion.  Neurological: She is alert and oriented to person, place, and time. No cranial nerve deficit. Gait normal.  Skin: Skin is dry. She is not diaphoretic.  Psychiatric: She has a normal mood and affect.  Vitals reviewed.   No results found for this or any previous visit (from  the past 72 hour(s)).  Dg Ankle Complete Left  Result Date: 02/25/2016 CLINICAL DATA:  Fall.  Ankle and metatarsal pain. EXAM: LEFT ANKLE COMPLETE - 3+ VIEW COMPARISON:  None. FINDINGS: There is a fracture through the proximal left fifth metatarsal shaft. Slightly displaced. No ankle acute bony abnormality. Soft tissues are intact. IMPRESSION: Minimally displaced fracture through the proximal left fifth metatarsal shaft. Electronically Signed   By: Rolm Baptise M.D.   On: 02/25/2016 08:47    ASSESSMENT AND PLAN:  Zian was seen today for foot injury.  Diagnoses and all orders for this visit:  Left ankle swelling -     DG Ankle Complete Left; Future  Closed displaced fracture of fifth metatarsal bone of left foot, initial encounter: She needs to see ortho, however I think this will do well as the fracture is note at the base.  Placing her in a post op shoe and crutches.  She is non weight bearing until ortho.  -     Post-op shoe -     Crutches -     Care order/instruction: -     Ambulatory referral to Orthopedic Surgery -     traMADol (ULTRAM) 50 MG tablet; Take 1 tablet (50 mg total) by mouth every 6 (six) hours as needed for severe pain.    The patient is advised to call or return to clinic if she does not see an improvement in symptoms, or to seek the care of the closest emergency department if she worsens with  the above plan.   Philis Fendt, MHS, PA-C [facility] 02/25/2016 9:04 AM

## 2016-02-27 ENCOUNTER — Ambulatory Visit (INDEPENDENT_AMBULATORY_CARE_PROVIDER_SITE_OTHER): Payer: BLUE CROSS/BLUE SHIELD | Admitting: Orthopedic Surgery

## 2016-02-27 DIAGNOSIS — S92352D Displaced fracture of fifth metatarsal bone, left foot, subsequent encounter for fracture with routine healing: Secondary | ICD-10-CM | POA: Diagnosis not present

## 2016-02-27 DIAGNOSIS — S92352A Displaced fracture of fifth metatarsal bone, left foot, initial encounter for closed fracture: Secondary | ICD-10-CM

## 2016-02-27 DIAGNOSIS — S92342A Displaced fracture of fourth metatarsal bone, left foot, initial encounter for closed fracture: Secondary | ICD-10-CM

## 2016-02-27 MED ORDER — TRAMADOL HCL 50 MG PO TABS: 50.0000 mg | ORAL_TABLET | Freq: Four times a day (QID) | ORAL | 0 refills | Status: DC | PRN

## 2016-02-27 NOTE — Progress Notes (Addendum)
Office Visit Note   Patient: Kara Hall           Date of Birth: August 10, 1970           MRN: [id number] Visit Date: 02/27/2016              Requested by: Tereasa Coop, PA-C [place], [place] [address] PCP: Harrison Mons, PA-C  Chief Complaint  Patient presents with  . Left Foot - Fracture    HPI: The patient is a 46 year old woman who is seen today for evaluation of a left foot fifth metatarsal fracture. She was walking on her porch 2 days ago when slipped on some wet wood and sustained an inversion injury to the left foot and ankle. Complains of pain and swelling today. Is in a post op shoe with crutches. Has been using Tramadol for pain with moderate relief. Requests a refill today.    Outside radiographs were reviewed. The distal 5th metatarsal shaft fracture with minimal displacement is seen. A 4th metatarsal shaft fracture with displacement is also seen.  Assessment & Plan: Visit Diagnoses:  1. Closed displaced fracture of fifth metatarsal bone of left foot with routine healing, subsequent encounter   2. Displaced fracture of fourth metatarsal bone, left foot, initial encounter for closed fracture     Plan: A Cam Walker today. She will be nonweightbearing on the left foot in her Cam [place] will use crutches or kneeling scooter. Given a note that she may return to work on Monday for seated work. We will need repeat radiographs of the left foot at follow-up.  Follow-Up Instructions: Return in about 3 weeks (around 03/19/2016).   Ortho Exam Physical Exam  Constitutional: Appears well-developed.  Head: Normocephalic.  Eyes: EOM are normal.  Neck: Normal range of motion.  Cardiovascular: Normal rate.   Pulmonary/Chest: Effort normal.  Neurological: Is alert.  Skin: Skin is warm.  Psychiatric: Has a normal mood and affect. Left foot: there is massive swelling with ecchymosis. No erythema. No blisters or ulcers. Is tender over 4th and 5th metatarsals. Pain with  dorsiflexion of ankle.   Imaging: No results found.  Orders:  No orders of the defined types were placed in this encounter.  No orders of the defined types were placed in this encounter.    Procedures: No procedures performed  Clinical Data: No additional findings.  Subjective: Review of Systems  Constitutional: Negative for chills and fever.  Musculoskeletal: Positive for arthralgias and joint swelling.  Skin: Positive for color change.    Objective: Vital Signs: There were no vitals taken for this visit.  Specialty Comments:  No specialty comments available.  PMFS History: Patient Active Problem List   Diagnosis Date Noted  . Adjustment disorder with mixed anxiety and depressed mood 11/28/2015  . Wrist pain, left 11/28/2015  . Primary thyroid cancer ([place]) 08/09/2013  . Anxiety 01/12/2013   Past Medical History:  Diagnosis Date  . Anemia   . Anxiety    panic attack, also reports problems with depression, relative to her job & upcoming surgery , relative to life problems - with children & spouse    . Back complaints 2013   has had steroid injection   . Depression   . DVT (deep venous thrombosis) ([place]) 2001   RLE  . Migraine    h/o migraines - as a teenager  & again now with family issues, states she has to lay down & rest in a dark room  (  08/28/2013)  . Muscle strain of hand    wearing home made splint today, L hand  . Peripheral vascular disease ([facility])    DVT- per pt. in 2001- had vein removed.   . Refusal of blood transfusions as patient is Jehovah's Witness   . Thyroid cancer ([place])   . Thyroid nodule     Family History  Problem Relation Age of Onset  . Heart disease Mother   . Hyperlipidemia Mother   . Heart disease Father   . Hyperlipidemia Father   . Diabetes Father   . Heart disease Brother   . Hyperlipidemia Brother   . Hypertension Brother   . Heart disease Brother   . Hyperlipidemia Brother     Past Surgical History:  Procedure Laterality  Date  . THROMBECTOMY Right 2001   leg; after 3rd child was born; for deep vein thrombosis  . THYROIDECTOMY Right 08/28/2013   Procedure: RIGHT THYROID LOBECTOMY POSSIBLE TOTAL THYROIDECTOMY;  Surgeon: Jodi Marble, MD;  Location: [facility];  Service: ENT;  Laterality: Right;  . THYROIDECTOMY Left 09/10/2013   Procedure: COMPLETE LEFT THYROIDECTOMY;  Surgeon: Jodi Marble, MD;  Location: [facility];  Service: ENT;  Laterality: Left;  . TUBAL LIGATION  2001   Social History   Occupational History  . [employer]   . Cosmetologist   .  [employer]   Social History Main Topics  . Smoking status: Never Smoker  . Smokeless tobacco: Never Used  . Alcohol use No  . Drug use: No  . Sexual activity: Not Currently    Partners: Male

## 2016-03-19 ENCOUNTER — Ambulatory Visit (INDEPENDENT_AMBULATORY_CARE_PROVIDER_SITE_OTHER): Payer: BLUE CROSS/BLUE SHIELD

## 2016-03-19 ENCOUNTER — Ambulatory Visit (INDEPENDENT_AMBULATORY_CARE_PROVIDER_SITE_OTHER): Payer: BLUE CROSS/BLUE SHIELD | Admitting: Family

## 2016-03-19 ENCOUNTER — Encounter (INDEPENDENT_AMBULATORY_CARE_PROVIDER_SITE_OTHER): Payer: Self-pay | Admitting: Orthopedic Surgery

## 2016-03-19 VITALS — Ht 66.0 in | Wt 140.0 lb

## 2016-03-19 DIAGNOSIS — S92345D Nondisplaced fracture of fourth metatarsal bone, left foot, subsequent encounter for fracture with routine healing: Secondary | ICD-10-CM

## 2016-03-19 DIAGNOSIS — S92352G Displaced fracture of fifth metatarsal bone, left foot, subsequent encounter for fracture with delayed healing: Secondary | ICD-10-CM | POA: Diagnosis not present

## 2016-03-19 DIAGNOSIS — M79672 Pain in left foot: Secondary | ICD-10-CM

## 2016-03-19 DIAGNOSIS — E890 Postprocedural hypothyroidism: Secondary | ICD-10-CM | POA: Diagnosis not present

## 2016-03-19 MED ORDER — TRAMADOL HCL 50 MG PO TABS: 50.0000 mg | ORAL_TABLET | Freq: Four times a day (QID) | ORAL | 0 refills | Status: DC | PRN

## 2016-03-19 NOTE — Progress Notes (Signed)
Office Visit Note   Patient: Kara Hall           Date of Birth: 03/09/1970           MRN: [id number] Visit Date: 03/19/2016              Requested by: Harrison Mons, PA-C [address] [place], [place] [address] PCP: Harrison Mons, PA-C  Chief Complaint  Patient presents with  . Left Foot - Pain, Fracture    DOI 02/24/16 left foot fracture.     HPI: Closed displaced fracture of fifth metatarsal bone of left foot. Displaced fracture of fourth metatarsal bone. The pt is non weight bearing with crutches and a fracture boot. She is very concerned about returning to work. She states that she works at the [employer] and that she does a lot of lifting and is on her feet all day long and that she is not allowed to return to work with restrictions and has FMLA paperwork for completion. Autumn L Forrest, RMA   Patient states she has been trying to help the foot heal up faster by walking on it.   States pain is improving. Has little swelling.  Has FMLA paperwork she needs filled out.   Assessment & Plan: Visit Diagnoses:  1. Pain in left foot   2. Closed displaced fracture of fifth metatarsal bone of left foot, initial encounter     Plan: Stressed the importance of nonweight bearing. Continue fracture boot and crutches. May only walk in CAM walker if this is completely pain free. Follow up in 3 weeks with repeat radiographs left foot.   Follow-Up Instructions: No Follow-up on file.   Ortho Exam Physical Exam  Constitutional: Appears well-developed.  Head: Normocephalic.  Eyes: EOM are normal.  Neck: Normal range of motion.  Cardiovascular: Normal rate.   Pulmonary/Chest: Effort normal.  Neurological: Is alert.  Skin: Skin is warm.  Psychiatric: Has a normal mood and affect. Left foot continued tenderness over left 5th metatarsal base laterally and plantarly. 4th metatarsal is nontender. Minimal swelling. No open areas or erythema.   Imaging: No results  found.  Labs: Lab Results  Component Value Date   GRAMSTAIN Few 05/04/2014   GRAMSTAIN WBC present-predominately PMN 05/04/2014   GRAMSTAIN No Squamous Epithelial Cells Seen 05/04/2014   GRAMSTAIN Rare Gram Positive Cocci In Pairs 05/04/2014   LABORGA Normal Upper Respiratory Flora 08/08/2015   LABORGA No Beta Hemolytic Streptococci Isolated 08/08/2015    Orders:  Orders Placed This Encounter  Procedures  . XR Foot Complete Left   Meds ordered this encounter  Medications  . traMADol (ULTRAM) 50 MG tablet    Sig: Take 1 tablet (50 mg total) by mouth every 6 (six) hours as needed for severe pain.    Dispense:  40 tablet    Refill:  0     Procedures: No procedures performed  Clinical Data: No additional findings.  Subjective: Review of Systems  Constitutional: Negative for chills and fever.  Musculoskeletal: Positive for arthralgias and gait problem.    Objective: Vital Signs: Ht 5\' 6"  (1.676 m)   Wt 140 lb (63.5 kg)   BMI 22.60 kg/m   Specialty Comments:  No specialty comments available.  PMFS History: Patient Active Problem List   Diagnosis Date Noted  . Adjustment disorder with mixed anxiety and depressed mood 11/28/2015  . Wrist pain, left 11/28/2015  . Primary thyroid cancer ([place]) 08/09/2013  . Anxiety 01/12/2013   Past Medical History:  Diagnosis Date  . Anemia   . Anxiety    panic attack, also reports problems with depression, relative to her job & upcoming surgery , relative to life problems - with children & spouse    . Back complaints 2013   has had steroid injection   . Depression   . DVT (deep venous thrombosis) ([place]) 2001   RLE  . Migraine    h/o migraines - as a teenager  & again now with family issues, states she has to lay down & rest in a dark room  (08/28/2013)  . Muscle strain of hand    wearing home made splint today, L hand  . Peripheral vascular disease (HCC)    DVT- per pt. in 2001- had vein removed.   . Refusal of blood  transfusions as patient is Jehovah's Witness   . Thyroid cancer ([place])   . Thyroid nodule     Family History  Problem Relation Age of Onset  . Heart disease Mother   . Hyperlipidemia Mother   . Heart disease Father   . Hyperlipidemia Father   . Diabetes Father   . Heart disease Brother   . Hyperlipidemia Brother   . Hypertension Brother   . Heart disease Brother   . Hyperlipidemia Brother     Past Surgical History:  Procedure Laterality Date  . THROMBECTOMY Right 2001   leg; after 3rd child was born; for deep vein thrombosis  . THYROIDECTOMY Right 08/28/2013   Procedure: RIGHT THYROID LOBECTOMY POSSIBLE TOTAL THYROIDECTOMY;  Surgeon: Jodi Marble, MD;  Location: [facility];  Service: ENT;  Laterality: Right;  . THYROIDECTOMY Left 09/10/2013   Procedure: COMPLETE LEFT THYROIDECTOMY;  Surgeon: Jodi Marble, MD;  Location: [facility];  Service: ENT;  Laterality: Left;  . TUBAL LIGATION  2001   Social History   Occupational History  . [employer]   . Cosmetologist   .  [employer]   Social History Main Topics  . Smoking status: Never Smoker  . Smokeless tobacco: Never Used  . Alcohol use No  . Drug use: No  . Sexual activity: Not Currently    Partners: Male

## 2016-04-02 ENCOUNTER — Ambulatory Visit (INDEPENDENT_AMBULATORY_CARE_PROVIDER_SITE_OTHER): Payer: BLUE CROSS/BLUE SHIELD | Admitting: Physician Assistant

## 2016-04-02 DIAGNOSIS — F4323 Adjustment disorder with mixed anxiety and depressed mood: Secondary | ICD-10-CM | POA: Diagnosis not present

## 2016-04-02 MED ORDER — ALPRAZOLAM 0.5 MG PO TABS: 0.5000 mg | ORAL_TABLET | Freq: Every day | ORAL | 0 refills | Status: DC | PRN

## 2016-04-02 NOTE — Progress Notes (Signed)
PRIMARY CARE AT [facility] [address] [place] [address] [phone]  Date:  04/02/2016   Name:  Kara Hall   DOB:  [birth year]   MRN:  [id number]  PCP:  Harrison Mons, PA-C    History of Present Illness:  Kara Hall is a 46 y.o. female patient who presents to PCP with  Chief Complaint  Patient presents with  . Medication Refill    xanax   patient reports that she continues to need the xanax to control her anxiety.   Patient states that her anxiety has increased with family stressors.  Currently staying with a friend.  She is missing her dog, who is not able to be at the friend's home.  She continues to have panic attacks--last panic attack was last week.  She is not taking her xanax daily.  She will take a diphenhydramine to help with sleep.  She will take half of the .5 tablet.  She has been without her medication for 2 weeks.   She is currently seperated from husband for 1.5 years, but can not have a divorce due to cost at this time.  Father is primary caretaker at this time, with children, as patient states that she does not want to live there because her children, "don't listen to her".  Mother and father have had multiple hospitalizations which also adds to her stressors.     Patient Active Problem List   Diagnosis Date Noted  . Adjustment disorder with mixed anxiety and depressed mood 11/28/2015  . Wrist pain, left 11/28/2015  . Primary thyroid cancer ([place]) 08/09/2013  . Anxiety 01/12/2013    Past Medical History:  Diagnosis Date  . Anemia   . Anxiety    panic attack, also reports problems with depression, relative to her job & upcoming surgery , relative to life problems - with children & spouse    . Back complaints 2013   has had steroid injection   . Depression   . DVT (deep venous thrombosis) ([place]) 2001   RLE  . Migraine    h/o migraines - as a teenager  & again now with family issues, states she has to lay down & rest in a dark room  (08/28/2013)  .  Muscle strain of hand    wearing home made splint today, L hand  . Peripheral vascular disease (HCC)    DVT- per pt. in 2001- had vein removed.   . Refusal of blood transfusions as patient is Jehovah's Witness   . Thyroid cancer ([place])   . Thyroid nodule     Past Surgical History:  Procedure Laterality Date  . THROMBECTOMY Right 2001   leg; after 3rd child was born; for deep vein thrombosis  . THYROIDECTOMY Right 08/28/2013   Procedure: RIGHT THYROID LOBECTOMY POSSIBLE TOTAL THYROIDECTOMY;  Surgeon: Jodi Marble, MD;  Location: [facility];  Service: ENT;  Laterality: Right;  . THYROIDECTOMY Left 09/10/2013   Procedure: COMPLETE LEFT THYROIDECTOMY;  Surgeon: Jodi Marble, MD;  Location: [facility];  Service: ENT;  Laterality: Left;  . TUBAL LIGATION  2001    Social History  Substance Use Topics  . Smoking status: Never Smoker  . Smokeless tobacco: Never Used  . Alcohol use No    Family History  Problem Relation Age of Onset  . Heart disease Mother   . Hyperlipidemia Mother   . Heart disease Father   . Hyperlipidemia Father   . Diabetes Father   . Heart disease Brother   .  Hyperlipidemia Brother   . Hypertension Brother   . Heart disease Brother   . Hyperlipidemia Brother     Allergies  Allergen Reactions  . Sulfa Antibiotics Other (See Comments)    Severe bladder, and kidney infection  . Trazodone And Nefazodone Other (See Comments)    Headache every am    Medication list has been reviewed and updated.  Current Outpatient Prescriptions on File Prior to Visit  Medication Sig Dispense Refill  . ALPRAZolam (XANAX) 0.5 MG tablet Take 1 tablet (0.5 mg total) by mouth 2 (two) times daily as needed for anxiety. 30 tablet 0  . levothyroxine (SYNTHROID, LEVOTHROID) 125 MCG tablet Take 125 mcg by mouth daily before breakfast.    . meloxicam (MOBIC) 7.5 MG tablet Take 1 tablet (7.5 mg total) by mouth daily. 30 tablet 3  . traMADol (ULTRAM) 50 MG tablet Take 1 tablet (50 mg total) by  mouth every 6 (six) hours as needed for severe pain. 40 tablet 0   No current facility-administered medications on file prior to visit.     ROS ROS otherwise unremarkable unless listed above.  Physical Examination: BP 100/88 (BP Location: Right Arm, Patient Position: Sitting, Cuff Size: Small)   Pulse 90   Temp 98.3 F (36.8 C) (Oral)   Resp 16   Ht 6' (1.829 m)   Wt 154 lb 6.4 oz (70 kg)   SpO2 99%   BMI 20.94 kg/m  Ideal Body Weight: Weight in (lb) to have BMI = 25: 183.9  Physical Exam  Constitutional: She is oriented to person, place, and time. She appears well-developed and well-nourished. No distress.  HENT:  Head: Normocephalic and atraumatic.  Right Ear: External ear normal.  Left Ear: External ear normal.  Eyes: Conjunctivae and EOM are normal. Pupils are equal, round, and reactive to light.  Cardiovascular: Normal rate.   Pulmonary/Chest: Effort normal. No respiratory distress.  Neurological: She is alert and oriented to person, place, and time.  Skin: She is not diaphoretic.  Psychiatric: She has a normal mood and affect. Her behavior is normal.     Assessment and Plan: Kara Hall is a 46 y.o. female who is here today for anxiety. Refilled for 1 month.  She will need to follow up with her pcp for future refills.  Advised that she should consider anti-depressant or therapy.  Her panic attacks are erratic, and feels anxious daily, and would likely do well with an ssri.  She is willing to return to see chelle in a month. Adjustment disorder with mixed anxiety and depressed mood - Plan: ALPRAZolam [name]) 0.5 MG tablet  Ivar Drape, PA-C [facility] 3/19/20183:28 PM

## 2016-04-02 NOTE — Patient Instructions (Addendum)
Please follow up with Chelle regarding your anxiety, and medication refill.  Call in advance to schedule with her.  It would be best that you see a therapist or have a medication daily.      IF you received an x-ray today, you will receive an invoice from [facility]. Please contact [facility] at [phone] with questions or concerns regarding your invoice.   IF you received labwork today, you will receive an invoice from [facility]. Please contact [facility] at [phone] with questions or concerns regarding your invoice.   Our billing staff will not be able to assist you with questions regarding bills from these companies.  You will be contacted with the lab results as soon as they are available. The fastest way to get your results is to activate your My Chart account. Instructions are located on the last page of this paperwork. If you have not heard from [place] regarding the results in 2 weeks, please contact this office.

## 2016-04-09 ENCOUNTER — Encounter (INDEPENDENT_AMBULATORY_CARE_PROVIDER_SITE_OTHER): Payer: Self-pay | Admitting: Orthopedic Surgery

## 2016-04-09 ENCOUNTER — Ambulatory Visit (INDEPENDENT_AMBULATORY_CARE_PROVIDER_SITE_OTHER): Payer: BLUE CROSS/BLUE SHIELD | Admitting: Orthopedic Surgery

## 2016-04-09 ENCOUNTER — Ambulatory Visit (INDEPENDENT_AMBULATORY_CARE_PROVIDER_SITE_OTHER): Payer: Self-pay

## 2016-04-09 VITALS — Ht 72.0 in | Wt 154.0 lb

## 2016-04-09 DIAGNOSIS — M79672 Pain in left foot: Secondary | ICD-10-CM

## 2016-04-09 DIAGNOSIS — S92352G Displaced fracture of fifth metatarsal bone, left foot, subsequent encounter for fracture with delayed healing: Secondary | ICD-10-CM

## 2016-04-09 MED ORDER — TRAMADOL HCL 50 MG PO TABS: 50.0000 mg | ORAL_TABLET | Freq: Four times a day (QID) | ORAL | 0 refills | Status: DC | PRN

## 2016-04-09 NOTE — Progress Notes (Signed)
Office Visit Note   Patient: Kara Hall           Date of Birth: September 28, 1970           MRN: [id number] Visit Date: 04/09/2016              Requested by: Harrison Mons, PA-C [address] [place], [place] [address] PCP: Harrison Mons, PA-C  Chief Complaint  Patient presents with  . Left Foot - Follow-up    DOI 02/24/16 closed displaced fx 5th MT    HPI: Patient is status post a closed fracture base of fifth metatarsal and fourth metatarsal left foot on 02/24/2016. She is currently in a fracture boot full weightbearing. Patient request a refill for her Ultram that she states that she was taking prior to her injury.  Assessment & Plan: Visit Diagnoses:  1. Pain in left foot   2. Closed displaced fracture of fifth metatarsal bone of left foot with delayed healing, subsequent encounter     Plan: Patient request a note to return to work tomorrow. She was provided a note and she is to wear her fracture boot or other supportive shoe to protect her left foot. She is given a refill prescription for her Ultram. Repeat 3 view radiographs of the left foot at follow-up.  Follow-Up Instructions: Return in about 4 weeks (around 05/07/2016).   Ortho Exam  Patient is alert, oriented, no adenopathy, well-dressed, normal affect, normal respiratory effort. Examination patient does have an antalgic gait with external rotation of the left lower extremity. She has good pulses good ankle and subtalar motion no dystrophic changes she is tender to palpation of the base of the fourth and fifth metatarsals.  Imaging: Xr Foot 2 Views Left  Result Date: 04/09/2016 Two-view radiographs of the left foot shows interval healing of the metaphyseal fracture of the base of the fifth fourth metatarsal with hypertrophic callus formation at the base of the fourth metatarsal. There is a gentle cascade across the metatarsal heads with no malalignment distally.   Labs: Lab Results  Component Value Date   GRAMSTAIN Few 05/04/2014   GRAMSTAIN WBC present-predominately PMN 05/04/2014   GRAMSTAIN No Squamous Epithelial Cells Seen 05/04/2014   GRAMSTAIN Rare Gram Positive Cocci In Pairs 05/04/2014   LABORGA Normal Upper Respiratory Flora 08/08/2015   LABORGA No Beta Hemolytic Streptococci Isolated 08/08/2015    Orders:  Orders Placed This Encounter  Procedures  . XR Foot 2 Views Left   Meds ordered this encounter  Medications  . traMADol (ULTRAM) 50 MG tablet    Sig: Take 1 tablet (50 mg total) by mouth every 6 (six) hours as needed for severe pain.    Dispense:  40 tablet    Refill:  0     Procedures: No procedures performed  Clinical Data: No additional findings.  ROS: Review of Systems  Objective: Vital Signs: Ht 6' (1.829 m)   Wt 154 lb (69.9 kg)   BMI 20.89 kg/m   Specialty Comments:  No specialty comments available.  PMFS History: Patient Active Problem List   Diagnosis Date Noted  . Adjustment disorder with mixed anxiety and depressed mood 11/28/2015  . Wrist pain, left 11/28/2015  . Primary thyroid cancer ([place]) 08/09/2013  . Anxiety 01/12/2013   Past Medical History:  Diagnosis Date  . Anemia   . Anxiety    panic attack, also reports problems with depression, relative to her job & upcoming surgery , relative to life problems - with children &  spouse    . Back complaints 2013   has had steroid injection   . Depression   . DVT (deep venous thrombosis) ([place]) 2001   RLE  . Migraine    h/o migraines - as a teenager  & again now with family issues, states she has to lay down & rest in a dark room  (08/28/2013)  . Muscle strain of hand    wearing home made splint today, L hand  . Peripheral vascular disease (HCC)    DVT- per pt. in 2001- had vein removed.   . Refusal of blood transfusions as patient is Jehovah's Witness   . Thyroid cancer ([place])   . Thyroid nodule     Family History  Problem Relation Age of Onset  . Heart disease Mother   .  Hyperlipidemia Mother   . Heart disease Father   . Hyperlipidemia Father   . Diabetes Father   . Heart disease Brother   . Hyperlipidemia Brother   . Hypertension Brother   . Heart disease Brother   . Hyperlipidemia Brother     Past Surgical History:  Procedure Laterality Date  . THROMBECTOMY Right 2001   leg; after 3rd child was born; for deep vein thrombosis  . THYROIDECTOMY Right 08/28/2013   Procedure: RIGHT THYROID LOBECTOMY POSSIBLE TOTAL THYROIDECTOMY;  Surgeon: Jodi Marble, MD;  Location: [facility];  Service: ENT;  Laterality: Right;  . THYROIDECTOMY Left 09/10/2013   Procedure: COMPLETE LEFT THYROIDECTOMY;  Surgeon: Jodi Marble, MD;  Location: [facility];  Service: ENT;  Laterality: Left;  . TUBAL LIGATION  2001   Social History   Occupational History  . [employer]   . Cosmetologist   .  [employer]   Social History Main Topics  . Smoking status: Never Smoker  . Smokeless tobacco: Never Used  . Alcohol use No  . Drug use: No  . Sexual activity: Not Currently    Partners: Male

## 2016-05-07 ENCOUNTER — Ambulatory Visit (INDEPENDENT_AMBULATORY_CARE_PROVIDER_SITE_OTHER): Payer: Self-pay | Admitting: Orthopedic Surgery

## 2016-05-07 ENCOUNTER — Encounter (INDEPENDENT_AMBULATORY_CARE_PROVIDER_SITE_OTHER): Payer: Self-pay | Admitting: Orthopedic Surgery

## 2016-05-07 ENCOUNTER — Ambulatory Visit (INDEPENDENT_AMBULATORY_CARE_PROVIDER_SITE_OTHER): Payer: BLUE CROSS/BLUE SHIELD | Admitting: Orthopedic Surgery

## 2016-05-07 ENCOUNTER — Ambulatory Visit (INDEPENDENT_AMBULATORY_CARE_PROVIDER_SITE_OTHER): Payer: Self-pay

## 2016-05-07 VITALS — Ht 72.0 in | Wt 154.0 lb

## 2016-05-07 DIAGNOSIS — M79672 Pain in left foot: Secondary | ICD-10-CM | POA: Diagnosis not present

## 2016-05-07 DIAGNOSIS — S92352G Displaced fracture of fifth metatarsal bone, left foot, subsequent encounter for fracture with delayed healing: Secondary | ICD-10-CM

## 2016-05-07 MED ORDER — TRAMADOL HCL 50 MG PO TABS: 50.0000 mg | ORAL_TABLET | Freq: Four times a day (QID) | ORAL | 0 refills | Status: DC | PRN

## 2016-05-07 NOTE — Progress Notes (Signed)
Office Visit Note   Patient: Kara Hall           Date of Birth: 07/01/1970           MRN: [id number] Visit Date: 05/07/2016              Requested by: Harrison Mons, PA-C [address] [place], [place] [address] PCP: Harrison Mons, PA-C  Chief Complaint  Patient presents with  . Left Foot - Follow-up    Closed displaced fracture of fifth metatarsal bone of left foot       HPI: Patient presents in follow-up status post a displaced fracture base of the fifth metatarsal left foot. Patient has pain with weightbearing is continuing to wear her fracture boot at work. She states she's on her foot all day long. She states the tramadol does help she states she's feeling depressed that the fracture is not healed yet.  Assessment & Plan: Visit Diagnoses:  1. Pain in left foot   2. Closed displaced fracture of fifth metatarsal bone of left foot with delayed healing, subsequent encounter     Plan: We'll have her continue to use the fracture boot at work she is given a refill prescription for the tramadol follow-up in 3 weeks repeat 3 view radiographs left foot follow-up.  Follow-Up Instructions: Return in about 3 weeks (around 05/28/2016).   Ortho Exam  Patient is alert, oriented, no adenopathy, well-dressed, normal affect, normal respiratory effort. Patient does have an antalgic gait she has good pulses she is point tender to palpation over the fracture site. There is no skin breakdown or cellulitis. Radiographs shows interval healing.  Imaging: Xr Foot Complete Left  Result Date: 05/07/2016 Three-view radiographs of the left foot shows interval healing of the metaphyseal fracture base of the fifth metatarsal left foot.   Labs: Lab Results  Component Value Date   GRAMSTAIN Few 05/04/2014   GRAMSTAIN WBC present-predominately PMN 05/04/2014   GRAMSTAIN No Squamous Epithelial Cells Seen 05/04/2014   GRAMSTAIN Rare Gram Positive Cocci In Pairs 05/04/2014   LABORGA Normal  Upper Respiratory Flora 08/08/2015   LABORGA No Beta Hemolytic Streptococci Isolated 08/08/2015    Orders:  Orders Placed This Encounter  Procedures  . XR Foot Complete Left   Meds ordered this encounter  Medications  . traMADol (ULTRAM) 50 MG tablet    Sig: Take 1 tablet (50 mg total) by mouth every 6 (six) hours as needed for severe pain.    Dispense:  50 tablet    Refill:  0     Procedures: No procedures performed  Clinical Data: No additional findings.  ROS:  All other systems negative, except as noted in the HPI. Review of Systems  Objective: Vital Signs: Ht 6' (1.829 m)   Wt 154 lb (69.9 kg)   BMI 20.89 kg/m   Specialty Comments:  No specialty comments available.  PMFS History: Patient Active Problem List   Diagnosis Date Noted  . Displaced fracture of fifth metatarsal bone of left foot with delayed healing 05/07/2016  . Adjustment disorder with mixed anxiety and depressed mood 11/28/2015  . Wrist pain, left 11/28/2015  . Primary thyroid cancer ([place]) 08/09/2013  . Anxiety 01/12/2013   Past Medical History:  Diagnosis Date  . Anemia   . Anxiety    panic attack, also reports problems with depression, relative to her job & upcoming surgery , relative to life problems - with children & spouse    . Back complaints 2013   has  had steroid injection   . Depression   . DVT (deep venous thrombosis) ([place]) 2001   RLE  . Migraine    h/o migraines - as a teenager  & again now with family issues, states she has to lay down & rest in a dark room  (08/28/2013)  . Muscle strain of hand    wearing home made splint today, L hand  . Peripheral vascular disease (HCC)    DVT- per pt. in 2001- had vein removed.   . Refusal of blood transfusions as patient is Jehovah's Witness   . Thyroid cancer ([place])   . Thyroid nodule     Family History  Problem Relation Age of Onset  . Heart disease Mother   . Hyperlipidemia Mother   . Heart disease Father   . Hyperlipidemia  Father   . Diabetes Father   . Heart disease Brother   . Hyperlipidemia Brother   . Hypertension Brother   . Heart disease Brother   . Hyperlipidemia Brother     Past Surgical History:  Procedure Laterality Date  . THROMBECTOMY Right 2001   leg; after 3rd child was born; for deep vein thrombosis  . THYROIDECTOMY Right 08/28/2013   Procedure: RIGHT THYROID LOBECTOMY POSSIBLE TOTAL THYROIDECTOMY;  Surgeon: Jodi Marble, MD;  Location: [facility];  Service: ENT;  Laterality: Right;  . THYROIDECTOMY Left 09/10/2013   Procedure: COMPLETE LEFT THYROIDECTOMY;  Surgeon: Jodi Marble, MD;  Location: [facility];  Service: ENT;  Laterality: Left;  . TUBAL LIGATION  2001   Social History   Occupational History  . [employer]   . Cosmetologist   .  [employer]   Social History Main Topics  . Smoking status: Never Smoker  . Smokeless tobacco: Never Used  . Alcohol use No  . Drug use: No  . Sexual activity: Not Currently    Partners: Male

## 2016-05-28 ENCOUNTER — Ambulatory Visit (INDEPENDENT_AMBULATORY_CARE_PROVIDER_SITE_OTHER): Payer: Self-pay | Admitting: Orthopedic Surgery

## 2016-05-30 ENCOUNTER — Ambulatory Visit (INDEPENDENT_AMBULATORY_CARE_PROVIDER_SITE_OTHER): Payer: Self-pay | Admitting: Orthopedic Surgery

## 2016-06-25 ENCOUNTER — Ambulatory Visit (INDEPENDENT_AMBULATORY_CARE_PROVIDER_SITE_OTHER): Payer: BLUE CROSS/BLUE SHIELD | Admitting: Orthopedic Surgery

## 2016-06-25 ENCOUNTER — Encounter (INDEPENDENT_AMBULATORY_CARE_PROVIDER_SITE_OTHER): Payer: Self-pay | Admitting: Orthopedic Surgery

## 2016-06-25 ENCOUNTER — Ambulatory Visit (INDEPENDENT_AMBULATORY_CARE_PROVIDER_SITE_OTHER): Payer: Self-pay

## 2016-06-25 VITALS — Ht 72.0 in | Wt 154.0 lb

## 2016-06-25 DIAGNOSIS — M79672 Pain in left foot: Secondary | ICD-10-CM | POA: Diagnosis not present

## 2016-06-25 DIAGNOSIS — S92352G Displaced fracture of fifth metatarsal bone, left foot, subsequent encounter for fracture with delayed healing: Secondary | ICD-10-CM

## 2016-06-25 MED ORDER — TRAMADOL HCL 50 MG PO TABS: 50.0000 mg | ORAL_TABLET | Freq: Four times a day (QID) | ORAL | 0 refills | Status: DC | PRN

## 2016-06-25 NOTE — Progress Notes (Signed)
Office Visit Note   Patient: Kara Hall           Date of Birth: 1970/09/15           MRN: [id number] Visit Date: 06/25/2016              Requested by: [place] Mons, PA-C [address] [place], [place] [address] PCP: [place] Mons, PA-C  Chief Complaint  Patient presents with  . Left Foot - Follow-up    Closed displaced fx 5th MT      HPI: Patient is status post closed fracture base of the fifth metatarsal and fourth metatarsal left foot. Date of injury approximately 4 months ago. Patient states that it is still tender she states she is on her feet all day long for work and this is painful  Assessment & Plan: Visit Diagnoses:  1. Pain in left foot   2. Closed displaced fracture of fifth metatarsal bone of left foot with delayed healing, subsequent encounter     Plan: Patient is provided a refill prescription for the Ultram. Recommend continue with stiff soled walking shoes. Patient states she would like to follow-up as needed.  Follow-Up Instructions: Return if symptoms worsen or fail to improve.   Ortho Exam  Patient is alert, oriented, no adenopathy, well-dressed, normal affect, normal respiratory effort. Examination patient still has a little bit of swelling over the lateral aspect left ankle. She does have tenderness to palpation of the base of the fifth metatarsal. The skin is intact there is no breakdown and no cellulitis. Patient has an antalgic gait.  Imaging: Xr Foot Complete Left  Result Date: 06/25/2016 Three-view radiographs of the left foot shows good healing of approximately 75% of the fracture there is one area of fibrous union. Patient also has healing fracture through the base of the fourth metatarsal.   Labs: Lab Results  Component Value Date   GRAMSTAIN Few 05/04/2014   GRAMSTAIN WBC present-predominately PMN 05/04/2014   GRAMSTAIN No Squamous Epithelial Cells Seen 05/04/2014   GRAMSTAIN Rare Gram Positive Cocci In Pairs 05/04/2014   LABORGA Normal Upper Respiratory Flora 08/08/2015   LABORGA No Beta Hemolytic Streptococci Isolated 08/08/2015    Orders:  Orders Placed This Encounter  Procedures  . XR Foot Complete Left   Meds ordered this encounter  Medications  . traMADol (ULTRAM) 50 MG tablet    Sig: Take 1 tablet (50 mg total) by mouth every 6 (six) hours as needed for severe pain.    Dispense:  50 tablet    Refill:  0     Procedures: No procedures performed  Clinical Data: No additional findings.  ROS:  All other systems negative, except as noted in the HPI. Review of Systems  Objective: Vital Signs: Ht 6' (1.829 m)   Wt 154 lb (69.9 kg)   BMI 20.89 kg/m   Specialty Comments:  No specialty comments available.  PMFS History: Patient Active Problem List   Diagnosis Date Noted  . Displaced fracture of fifth metatarsal bone of left foot with delayed healing 05/07/2016  . Adjustment disorder with mixed anxiety and depressed mood 11/28/2015  . Wrist pain, left 11/28/2015  . Primary thyroid cancer ([facility]) 08/09/2013  . Anxiety 01/12/2013   Past Medical History:  Diagnosis Date  . Anemia   . Anxiety    panic attack, also reports problems with depression, relative to her job & upcoming surgery , relative to life problems - with children & spouse    . Back complaints  2013   has had steroid injection   . Depression   . DVT (deep venous thrombosis) ([facility]) 2001   RLE  . Migraine    h/o migraines - as a teenager  & again now with family issues, states she has to lay down & rest in a dark room  (08/28/2013)  . Muscle strain of hand    wearing home made splint today, L hand  . Peripheral vascular disease (HCC)    DVT- per pt. in 2001- had vein removed.   . Refusal of blood transfusions as patient is Jehovah's Witness   . Thyroid cancer ([facility])   . Thyroid nodule     Family History  Problem Relation Age of Onset  . Heart disease Mother   . Hyperlipidemia Mother   . Heart disease Father   .  Hyperlipidemia Father   . Diabetes Father   . Heart disease Brother   . Hyperlipidemia Brother   . Hypertension Brother   . Heart disease Brother   . Hyperlipidemia Brother     Past Surgical History:  Procedure Laterality Date  . THROMBECTOMY Right 2001   leg; after 3rd child was born; for deep vein thrombosis  . THYROIDECTOMY Right 08/28/2013   Procedure: RIGHT THYROID LOBECTOMY POSSIBLE TOTAL THYROIDECTOMY;  Surgeon: Jodi Marble, MD;  Location: [facility];  Service: ENT;  Laterality: Right;  . THYROIDECTOMY Left 09/10/2013   Procedure: COMPLETE LEFT THYROIDECTOMY;  Surgeon: Jodi Marble, MD;  Location: [facility];  Service: ENT;  Laterality: Left;  . TUBAL LIGATION  2001   Social History   Occupational History  . [employer]   . Cosmetologist   .  [employer]   Social History Main Topics  . Smoking status: Never Smoker  . Smokeless tobacco: Never Used  . Alcohol use No  . Drug use: No  . Sexual activity: Not Currently    Partners: Male

## 2016-07-17 DIAGNOSIS — Z85850 Personal history of malignant neoplasm of thyroid: Secondary | ICD-10-CM | POA: Diagnosis not present

## 2016-07-17 DIAGNOSIS — E890 Postprocedural hypothyroidism: Secondary | ICD-10-CM | POA: Diagnosis not present

## 2016-08-01 ENCOUNTER — Encounter (INDEPENDENT_AMBULATORY_CARE_PROVIDER_SITE_OTHER): Payer: Self-pay | Admitting: Family

## 2016-08-01 ENCOUNTER — Ambulatory Visit (INDEPENDENT_AMBULATORY_CARE_PROVIDER_SITE_OTHER): Payer: BLUE CROSS/BLUE SHIELD

## 2016-08-01 ENCOUNTER — Ambulatory Visit (INDEPENDENT_AMBULATORY_CARE_PROVIDER_SITE_OTHER): Payer: BLUE CROSS/BLUE SHIELD | Admitting: Family

## 2016-08-01 VITALS — Ht 72.0 in | Wt 154.0 lb

## 2016-08-01 DIAGNOSIS — S92352G Displaced fracture of fifth metatarsal bone, left foot, subsequent encounter for fracture with delayed healing: Secondary | ICD-10-CM

## 2016-08-01 MED ORDER — TRAMADOL HCL 50 MG PO TABS: 50.0000 mg | ORAL_TABLET | Freq: Three times a day (TID) | ORAL | 0 refills | Status: DC | PRN

## 2016-08-01 NOTE — Progress Notes (Signed)
Office Visit Note   Patient: Kara Hall           Date of Birth: Jun 23, 1970           MRN: [id number] Visit Date: 08/01/2016              Requested by: [place] Mons, PA-C [address] [place], [place] [address] PCP: [place] Mons, PA-C  Chief Complaint  Patient presents with  . Left Foot - Pain      HPI: The patient is a 46 year old woman who presents today in follow-up for continued pain to the left foot. She sustained a closed fracture of fifth metatarsal base back in February of this year. She March 4 metatarsal as well. She has been recommended to wear stiff soled walking shoes. She is return to work. She continues to take her tramadol daily which she is requesting refill of today. Complains of aching and persistent swelling and pain by the end of the day.  Today is in flip-flops  Assessment & Plan: Visit Diagnoses:  1. Closed displaced fracture of fifth metatarsal bone of left foot with delayed healing, subsequent encounter     Plan: Again reinforce importance of distal walking shoes. Anti-inflammatories for pain. We'll refill the tramadol one last time.  Follow-Up Instructions: No Follow-up on file.   Ortho Exam  Patient is alert, oriented, no adenopathy, well-dressed, normal affect, normal respiratory effort. The base of the fifth metatarsal on the left foot is tender to deep palpation there is no appreciable swelling no ecchymosis no deformity. Does have a good dorsalis pedis pulse.  Imaging: No results found.  Labs: Lab Results  Component Value Date   GRAMSTAIN Few 05/04/2014   GRAMSTAIN WBC present-predominately PMN 05/04/2014   GRAMSTAIN No Squamous Epithelial Cells Seen 05/04/2014   GRAMSTAIN Rare Gram Positive Cocci In Pairs 05/04/2014   LABORGA Normal Upper Respiratory Flora 08/08/2015   LABORGA No Beta Hemolytic Streptococci Isolated 08/08/2015    Orders:  Orders Placed This Encounter  Procedures  . XR Foot Complete Left   Meds  ordered this encounter  Medications  . traMADol (ULTRAM) 50 MG tablet    Sig: Take 1 tablet (50 mg total) by mouth 3 (three) times daily as needed for severe pain.    Dispense:  50 tablet    Refill:  0     Procedures: No procedures performed  Clinical Data: No additional findings.  ROS:  All other systems negative, except as noted in the HPI. Review of Systems  Constitutional: Negative for chills and fever.  Musculoskeletal: Positive for arthralgias.    Objective: Vital Signs: Ht 6' (1.829 m)   Wt 154 lb (69.9 kg)   BMI 20.89 kg/m   Specialty Comments:  No specialty comments available.  PMFS History: Patient Active Problem List   Diagnosis Date Noted  . Displaced fracture of fifth metatarsal bone of left foot with delayed healing 05/07/2016  . Adjustment disorder with mixed anxiety and depressed mood 11/28/2015  . Wrist pain, left 11/28/2015  . Primary thyroid cancer ([facility]) 08/09/2013  . Anxiety 01/12/2013   Past Medical History:  Diagnosis Date  . Anemia   . Anxiety    panic attack, also reports problems with depression, relative to her job & upcoming surgery , relative to life problems - with children & spouse    . Back complaints 2013   has had steroid injection   . Depression   . DVT (deep venous thrombosis) ([facility]) 2001   RLE  .  Migraine    h/o migraines - as a teenager  & again now with family issues, states she has to lay down & rest in a dark room  (08/28/2013)  . Muscle strain of hand    wearing home made splint today, L hand  . Peripheral vascular disease (HCC)    DVT- per pt. in 2001- had vein removed.   . Refusal of blood transfusions as patient is Jehovah's Witness   . Thyroid cancer ([facility])   . Thyroid nodule     Family History  Problem Relation Age of Onset  . Heart disease Mother   . Hyperlipidemia Mother   . Heart disease Father   . Hyperlipidemia Father   . Diabetes Father   . Heart disease Brother   . Hyperlipidemia Brother   .  Hypertension Brother   . Heart disease Brother   . Hyperlipidemia Brother     Past Surgical History:  Procedure Laterality Date  . THROMBECTOMY Right 2001   leg; after 3rd child was born; for deep vein thrombosis  . THYROIDECTOMY Right 08/28/2013   Procedure: RIGHT THYROID LOBECTOMY POSSIBLE TOTAL THYROIDECTOMY;  Surgeon: Jodi Marble, MD;  Location: [facility];  Service: ENT;  Laterality: Right;  . THYROIDECTOMY Left 09/10/2013   Procedure: COMPLETE LEFT THYROIDECTOMY;  Surgeon: Jodi Marble, MD;  Location: [facility];  Service: ENT;  Laterality: Left;  . TUBAL LIGATION  2001   Social History   Occupational History  . [employer]   . Cosmetologist   .  [employer]   Social History Main Topics  . Smoking status: Never Smoker  . Smokeless tobacco: Never Used  . Alcohol use No  . Drug use: No  . Sexual activity: Not Currently    Partners: Male

## 2016-08-08 ENCOUNTER — Encounter: Payer: Self-pay | Admitting: Physician Assistant

## 2016-08-08 ENCOUNTER — Ambulatory Visit (INDEPENDENT_AMBULATORY_CARE_PROVIDER_SITE_OTHER): Payer: BLUE CROSS/BLUE SHIELD | Admitting: Physician Assistant

## 2016-08-08 VITALS — BP 113/76 | HR 86 | Temp 98.5°F | Resp 16 | Ht 66.0 in | Wt 157.6 lb

## 2016-08-08 DIAGNOSIS — R300 Dysuria: Secondary | ICD-10-CM

## 2016-08-08 DIAGNOSIS — F4323 Adjustment disorder with mixed anxiety and depressed mood: Secondary | ICD-10-CM | POA: Diagnosis not present

## 2016-08-08 DIAGNOSIS — R350 Frequency of micturition: Secondary | ICD-10-CM | POA: Diagnosis not present

## 2016-08-08 LAB — POCT URINALYSIS DIP (MANUAL ENTRY)
Bilirubin, UA: NEGATIVE
Glucose, UA: NEGATIVE mg/dL
Ketones, POC UA: NEGATIVE mg/dL
NITRITE UA: NEGATIVE
PH UA: 7 (ref 5.0–8.0)
Spec Grav, UA: 1.025 (ref 1.010–1.025)
UROBILINOGEN UA: 0.2 U/dL

## 2016-08-08 MED ORDER — ALPRAZOLAM 0.5 MG PO TABS: 0.2500 mg | ORAL_TABLET | Freq: Every day | ORAL | 0 refills | Status: DC | PRN

## 2016-08-08 MED ORDER — CIPROFLOXACIN HCL 250 MG PO TABS: 250.0000 mg | ORAL_TABLET | Freq: Two times a day (BID) | ORAL | 0 refills | Status: AC

## 2016-08-08 NOTE — Patient Instructions (Addendum)
Placing urine culture   Urinary Tract Infection, Adult A urinary tract infection (UTI) is an infection of any part of the urinary tract. The urinary tract includes the:  Kidneys.  Ureters.  Bladder.  Urethra.  These organs make, store, and get rid of pee (urine) in the body. Follow these instructions at home:  Take over-the-counter and prescription medicines only as told by your doctor.  If you were prescribed an antibiotic medicine, take it as told by your doctor. Do not stop taking the antibiotic even if you start to feel better.  Avoid the following drinks: ? Alcohol. ? Caffeine. ? Tea. ? Carbonated drinks.  Drink enough fluid to keep your pee clear or pale yellow.  Keep all follow-up visits as told by your doctor. This is important.  Make sure to: ? Empty your bladder often and completely. Do not to hold pee for long periods of time. ? Empty your bladder before and after sex. ? Wipe from front to back after a bowel movement if you are female. Use each tissue one time when you wipe. Contact a doctor if:  You have back pain.  You have a fever.  You feel sick to your stomach (nauseous).  You throw up (vomit).  Your symptoms do not get better after 3 days.  Your symptoms go away and then come back. Get help right away if:  You have very bad back pain.  You have very bad lower belly (abdominal) pain.  You are throwing up and cannot keep down any medicines or water. This information is not intended to replace advice given to you by your health care provider. Make sure you discuss any questions you have with your health care provider. Document Released: 06/20/2007 Document Revised: 06/09/2015 Document Reviewed: 11/22/2014 Elsevier Interactive Patient Education  2018 [employer].    IF you received an x-ray today, you will receive an invoice from [facility]. Please contact [facility] at [phone] with questions or concerns regarding  your invoice.   IF you received labwork today, you will receive an invoice from [facility]. Please contact [facility] at [phone] with questions or concerns regarding your invoice.   Our billing staff will not be able to assist you with questions regarding bills from these companies.  You will be contacted with the lab results as soon as they are available. The fastest way to get your results is to activate your My Chart account. Instructions are located on the last page of this paperwork. If you have not heard from [place] regarding the results in 2 weeks, please contact this office.    We recommend that you schedule a mammogram for breast cancer screening. Typically, you do not need a referral to do this. Please contact a local imaging center to schedule your mammogram.  [facility] - [phone]  *ask for the Radiology Department [facility] - [phone] or [phone]  [facility] - [phone] [place] [phone] [facility] - [phone]  *ask for the [facility] - [phone]  *ask for the Radiology Department [facility] - [phone]  *ask for the [facility] - [phone]
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
## 2016-08-10 LAB — URINE CULTURE

## 2016-08-20 ENCOUNTER — Ambulatory Visit (INDEPENDENT_AMBULATORY_CARE_PROVIDER_SITE_OTHER): Payer: Self-pay | Admitting: Orthopedic Surgery

## 2016-08-20 ENCOUNTER — Other Ambulatory Visit: Payer: Self-pay | Admitting: Physician Assistant

## 2016-08-20 DIAGNOSIS — F4323 Adjustment disorder with mixed anxiety and depressed mood: Secondary | ICD-10-CM

## 2016-08-20 MED ORDER — ALPRAZOLAM 0.5 MG PO TABS: 0.2500 mg | ORAL_TABLET | Freq: Every day | ORAL | 0 refills | Status: DC | PRN

## 2016-08-20 NOTE — Progress Notes (Signed)
Please send to her pharmacy, Tuesday 08/21/2016

## 2016-08-21 NOTE — Progress Notes (Signed)
Script called to [facility]

## 2016-09-05 ENCOUNTER — Other Ambulatory Visit (INDEPENDENT_AMBULATORY_CARE_PROVIDER_SITE_OTHER): Payer: Self-pay | Admitting: Family

## 2016-09-05 ENCOUNTER — Telehealth (INDEPENDENT_AMBULATORY_CARE_PROVIDER_SITE_OTHER): Payer: Self-pay

## 2016-09-05 DIAGNOSIS — E890 Postprocedural hypothyroidism: Secondary | ICD-10-CM | POA: Diagnosis not present

## 2016-09-05 DIAGNOSIS — S92352G Displaced fracture of fifth metatarsal bone, left foot, subsequent encounter for fracture with delayed healing: Secondary | ICD-10-CM

## 2016-09-05 MED ORDER — TRAMADOL HCL 50 MG PO TABS: 50.0000 mg | ORAL_TABLET | Freq: Two times a day (BID) | ORAL | 0 refills | Status: DC

## 2016-09-05 NOTE — Telephone Encounter (Signed)
Patient would like a Rx refill on Tramadol.  Cb# is [phone].  Please advise.  Thank You.

## 2016-09-06 NOTE — Telephone Encounter (Signed)
I called rx to [facility] on [address], left diagnosis code and rx information. Patient is aware.

## 2016-09-26 ENCOUNTER — Telehealth (INDEPENDENT_AMBULATORY_CARE_PROVIDER_SITE_OTHER): Payer: Self-pay | Admitting: Orthopedic Surgery

## 2016-09-26 NOTE — Telephone Encounter (Signed)
Returned call to patient left message to call back. 

## 2016-10-01 ENCOUNTER — Other Ambulatory Visit: Payer: Self-pay | Admitting: Family Medicine

## 2016-10-01 ENCOUNTER — Other Ambulatory Visit: Payer: Self-pay | Admitting: Physician Assistant

## 2016-10-01 DIAGNOSIS — Z1231 Encounter for screening mammogram for malignant neoplasm of breast: Secondary | ICD-10-CM

## 2016-10-04 ENCOUNTER — Ambulatory Visit (INDEPENDENT_AMBULATORY_CARE_PROVIDER_SITE_OTHER): Payer: BLUE CROSS/BLUE SHIELD

## 2016-10-04 ENCOUNTER — Encounter (INDEPENDENT_AMBULATORY_CARE_PROVIDER_SITE_OTHER): Payer: Self-pay | Admitting: Orthopedic Surgery

## 2016-10-04 ENCOUNTER — Ambulatory Visit (INDEPENDENT_AMBULATORY_CARE_PROVIDER_SITE_OTHER): Payer: BLUE CROSS/BLUE SHIELD | Admitting: Orthopedic Surgery

## 2016-10-04 DIAGNOSIS — M6702 Short Achilles tendon (acquired), left ankle: Secondary | ICD-10-CM | POA: Diagnosis not present

## 2016-10-04 DIAGNOSIS — S92352G Displaced fracture of fifth metatarsal bone, left foot, subsequent encounter for fracture with delayed healing: Secondary | ICD-10-CM

## 2016-10-04 MED ORDER — TRAMADOL HCL 50 MG PO TABS: 50.0000 mg | ORAL_TABLET | Freq: Two times a day (BID) | ORAL | 0 refills | Status: DC

## 2016-10-04 NOTE — Progress Notes (Signed)
Office Visit Note   Patient: Kara Hall           Date of Birth: [birth year]           MRN: [id number] Visit Date: 10/04/2016              Requested by: [place] Mons, PA-C [place], [place] [address] PCP: [place] Mons, PA-C  Chief Complaint  Patient presents with  . Left Foot - Follow-up    Closed displaced fracture left 5th metatarsal delayed healing      HPI: Patient is a 46 year old woman who is status post closed fracture base of the fifth and fourth metatarsals left foot. Patient states she has pain with start up she states the tramadol does help her knee feels like she is wearing a stiff soled shoe.  Assessment & Plan: Visit Diagnoses:  1. Closed displaced fracture of fifth metatarsal bone of left foot with delayed healing, subsequent encounter   2. Achilles tendon contracture, left     Plan: Recommended a stiff soled Trail running shoe or walking shoe. Recommended going through [employer]. Patient is given instructions and demonstrated heel cord stretching to do 5 times a day admitted at a time each. She will follow up as needed.  Follow-Up Instructions: Return if symptoms worsen or fail to improve.   Ortho Exam  Patient is alert, oriented, no adenopathy, well-dressed, normal affect, normal respiratory effort. Examination patient has significant heel cord contracture with dorsiflexion only to neutral with her knee extended. She has good pulses she is tender to palpation of the base of the fifth metatarsal. There is no redness no cellulitis no ulcers no callus.  Imaging: Xr Foot Complete Left  Result Date: 10/04/2016 2 view radiographs of the left foot shows good interval healing of the fractures through the base of the fourth and fifth metatarsals.  No images are attached to the encounter.  Labs: Lab Results  Component Value Date   GRAMSTAIN Few 05/04/2014   GRAMSTAIN WBC present-predominately PMN 05/04/2014   GRAMSTAIN No Squamous  Epithelial Cells Seen 05/04/2014   GRAMSTAIN Rare Gram Positive Cocci In Pairs 05/04/2014   LABORGA Normal Upper Respiratory Flora 08/08/2015   LABORGA No Beta Hemolytic Streptococci Isolated 08/08/2015    Orders:  Orders Placed This Encounter  Procedures  . XR Foot Complete Left   Meds ordered this encounter  Medications  . traMADol (ULTRAM) 50 MG tablet    Sig: Take 1 tablet (50 mg total) by mouth 2 (two) times daily.    Dispense:  20 tablet    Refill:  0     Procedures: No procedures performed  Clinical Data: No additional findings.  ROS:  All other systems negative, except as noted in the HPI. Review of Systems  Objective: Vital Signs: There were no vitals taken for this visit.  Specialty Comments:  No specialty comments available.  PMFS History: Patient Active Problem List   Diagnosis Date Noted  . Achilles tendon contracture, left 10/04/2016  . Displaced fracture of fifth metatarsal bone of left foot with delayed healing 05/07/2016  . Adjustment disorder with mixed anxiety and depressed mood 11/28/2015  . Wrist pain, left 11/28/2015  . Primary thyroid cancer ([place]) 08/09/2013  . Anxiety 01/12/2013   Past Medical History:  Diagnosis Date  . Anemia   . Anxiety    panic attack, also reports problems with depression, relative to her job & upcoming surgery , relative to life problems - with children &  spouse    . Back complaints 2013   has had steroid injection   . Depression   . DVT (deep venous thrombosis) ([place]) 2001   RLE  . Migraine    h/o migraines - as a teenager  & again now with family issues, states she has to lay down & rest in a dark room  (08/28/2013)  . Muscle strain of hand    wearing home made splint today, L hand  . Peripheral vascular disease (HCC)    DVT- per pt. in 2001- had vein removed.   . Refusal of blood transfusions as patient is Jehovah's Witness   . Thyroid cancer ([place])   . Thyroid nodule     Family History  Problem Relation  Age of Onset  . Heart disease Mother   . Hyperlipidemia Mother   . Heart disease Father   . Hyperlipidemia Father   . Diabetes Father   . Heart disease Brother   . Hyperlipidemia Brother   . Hypertension Brother   . Heart disease Brother   . Hyperlipidemia Brother     Past Surgical History:  Procedure Laterality Date  . THROMBECTOMY Right 2001   leg; after 3rd child was born; for deep vein thrombosis  . THYROIDECTOMY Right 08/28/2013   Procedure: RIGHT THYROID LOBECTOMY POSSIBLE TOTAL THYROIDECTOMY;  Surgeon: Jodi Marble, MD;  Location: [facility];  Service: ENT;  Laterality: Right;  . THYROIDECTOMY Left 09/10/2013   Procedure: COMPLETE LEFT THYROIDECTOMY;  Surgeon: Jodi Marble, MD;  Location: [facility];  Service: ENT;  Laterality: Left;  . TUBAL LIGATION  2001   Social History   Occupational History  . [employer]   . Cosmetologist   .  [employer]   Social History Main Topics  . Smoking status: Never Smoker  . Smokeless tobacco: Never Used  . Alcohol use No  . Drug use: No  . Sexual activity: Not Currently    Partners: Male

## 2016-10-11 ENCOUNTER — Ambulatory Visit
Admission: RE | Admit: 2016-10-11 | Discharge: 2016-10-11 | Disposition: A | Discharge status: Home / Self Care | Payer: BLUE CROSS/BLUE SHIELD | Source: Ambulatory Visit | Attending: Physician Assistant | Admitting: Physician Assistant

## 2016-10-11 DIAGNOSIS — Z1231 Encounter for screening mammogram for malignant neoplasm of breast: Secondary | ICD-10-CM | POA: Diagnosis not present

## 2016-10-17 DIAGNOSIS — E890 Postprocedural hypothyroidism: Secondary | ICD-10-CM | POA: Diagnosis not present

## 2016-10-29 ENCOUNTER — Telehealth (INDEPENDENT_AMBULATORY_CARE_PROVIDER_SITE_OTHER): Payer: Self-pay | Admitting: Orthopedic Surgery

## 2016-10-29 DIAGNOSIS — S92352G Displaced fracture of fifth metatarsal bone, left foot, subsequent encounter for fracture with delayed healing: Secondary | ICD-10-CM

## 2016-10-29 MED ORDER — TRAMADOL HCL 50 MG PO TABS: 50.0000 mg | ORAL_TABLET | Freq: Two times a day (BID) | ORAL | 0 refills | Status: DC

## 2016-10-29 NOTE — Telephone Encounter (Signed)
OK call into her pharmacy

## 2016-10-29 NOTE — Telephone Encounter (Signed)
Patient called asking for a refill on Tramadol. She hasn't had any power and hasn't been able to wash her foot and she said it's throbbing. If it could be called into her pharmacy. Thank you! Could you give her a call when its been called in? Cb # [phone]

## 2016-10-29 NOTE — Addendum Note (Signed)
Addended by: Maxcine Ham on: 10/29/2016 04:20 PM   Modules accepted: Orders

## 2016-10-29 NOTE — Telephone Encounter (Signed)
I called and left vm to advise rx called into pharmacy.

## 2016-11-16 DIAGNOSIS — Z23 Encounter for immunization: Secondary | ICD-10-CM | POA: Diagnosis not present

## 2016-11-27 ENCOUNTER — Other Ambulatory Visit (INDEPENDENT_AMBULATORY_CARE_PROVIDER_SITE_OTHER): Payer: Self-pay | Admitting: Orthopedic Surgery

## 2016-11-27 DIAGNOSIS — S92352G Displaced fracture of fifth metatarsal bone, left foot, subsequent encounter for fracture with delayed healing: Secondary | ICD-10-CM

## 2016-11-27 MED ORDER — TRAMADOL HCL 50 MG PO TABS: 50.0000 mg | ORAL_TABLET | Freq: Two times a day (BID) | ORAL | 0 refills | Status: DC

## 2016-11-27 NOTE — Telephone Encounter (Signed)
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 The number to contact patient is [phone]

## 2016-11-27 NOTE — Telephone Encounter (Signed)
rx written

## 2016-11-28 ENCOUNTER — Other Ambulatory Visit (INDEPENDENT_AMBULATORY_CARE_PROVIDER_SITE_OTHER): Payer: Self-pay | Admitting: Orthopedic Surgery

## 2016-11-28 DIAGNOSIS — S92352G Displaced fracture of fifth metatarsal bone, left foot, subsequent encounter for fracture with delayed healing: Secondary | ICD-10-CM

## 2016-11-28 NOTE — Telephone Encounter (Signed)
I called and spoke with patient advised that rx for tramadol called into [facility].

## 2016-12-19 ENCOUNTER — Encounter: Payer: Self-pay | Admitting: Physician Assistant

## 2016-12-19 ENCOUNTER — Other Ambulatory Visit: Payer: Self-pay

## 2016-12-19 ENCOUNTER — Ambulatory Visit: Payer: BLUE CROSS/BLUE SHIELD | Admitting: Physician Assistant

## 2016-12-19 VITALS — BP 118/84 | HR 86 | Temp 98.1°F | Resp 16 | Ht 66.0 in | Wt 170.2 lb

## 2016-12-19 DIAGNOSIS — R0981 Nasal congestion: Secondary | ICD-10-CM | POA: Diagnosis not present

## 2016-12-19 DIAGNOSIS — R21 Rash and other nonspecific skin eruption: Secondary | ICD-10-CM

## 2016-12-19 MED ORDER — CLOTRIMAZOLE-BETAMETHASONE 1-0.05 % EX CREA: 1.0000 "application " | TOPICAL_CREAM | Freq: Two times a day (BID) | CUTANEOUS | 0 refills | Status: DC

## 2016-12-19 MED ORDER — BENZONATATE 100 MG PO CAPS: 100.0000 mg | ORAL_CAPSULE | Freq: Three times a day (TID) | ORAL | 0 refills | Status: DC | PRN

## 2016-12-19 MED ORDER — FLUTICASONE PROPIONATE 50 MCG/ACT NA SUSP: 2.0000 | Freq: Every day | NASAL | 12 refills | Status: DC

## 2016-12-19 NOTE — Progress Notes (Signed)
PRIMARY CARE AT [facility] [address] [place] [address] [phone]  Date:  12/19/2016   Name:  Kara Hall   DOB:  [birth year]   MRN:  [id number]  PCP:  Harrison Mons, PA-C    History of Present Illness:  Kara Hall is a 46 y.o. female patient who presents to PCP with  Chief Complaint  Patient presents with  . Rash    patient c/o rash on L breast x several months, states that she has been scratching until raw  . Medication Refill    patient requesting refill for xanax and tramadol     Patient presents for rash that she states has been apparent for several months.  It is itchy on the left breast up top.  She is unsure if this is due to dryness.  She has done nothing for the itching.  It is not painful.  She states that the rash is circular.  Never with broken skin.  She does shave the the breast nipple hair bilaterally.  ' Cough is present for the last week.  It appears to be improving.  She has no sob or dyspnea.  Slight congestion, which is generally improved with the flonase.  No fever.    Patient Active Problem List   Diagnosis Date Noted  . Achilles tendon contracture, left 10/04/2016  . Displaced fracture of fifth metatarsal bone of left foot with delayed healing 05/07/2016  . Adjustment disorder with mixed anxiety and depressed mood 11/28/2015  . Wrist pain, left 11/28/2015  . Primary thyroid cancer ([place]) 08/09/2013  . Anxiety 01/12/2013    Past Medical History:  Diagnosis Date  . Anemia   . Anxiety    panic attack, also reports problems with depression, relative to her job & upcoming surgery , relative to life problems - with children & spouse    . Back complaints 2013   has had steroid injection   . Depression   . DVT (deep venous thrombosis) ([place]) 2001   RLE  . Migraine    h/o migraines - as a teenager  & again now with family issues, states she has to lay down & rest in a dark room  (08/28/2013)  . Muscle strain of hand    wearing home  made splint today, L hand  . Peripheral vascular disease ([facility])    DVT- per pt. in 2001- had vein removed.   . Refusal of blood transfusions as patient is Jehovah's Witness   . Thyroid cancer ([place])   . Thyroid nodule     Past Surgical History:  Procedure Laterality Date  . THROMBECTOMY Right 2001   leg; after 3rd child was born; for deep vein thrombosis  . THYROIDECTOMY Right 08/28/2013   Procedure: RIGHT THYROID LOBECTOMY POSSIBLE TOTAL THYROIDECTOMY;  Surgeon: Jodi Marble, MD;  Location: [facility];  Service: ENT;  Laterality: Right;  . THYROIDECTOMY Left 09/10/2013   Procedure: COMPLETE LEFT THYROIDECTOMY;  Surgeon: Jodi Marble, MD;  Location: [facility];  Service: ENT;  Laterality: Left;  . TUBAL LIGATION  2001    Social History   Tobacco Use  . Smoking status: Never Smoker  . Smokeless tobacco: Never Used  Substance Use Topics  . Alcohol use: No  . Drug use: No    Family History  Problem Relation Age of [place]  . Heart disease Mother   . Hyperlipidemia Mother   . Heart disease Father   . Hyperlipidemia Father   . Diabetes Father   .  Heart disease Brother   . Hyperlipidemia Brother   . Hypertension Brother   . Heart disease Brother   . Hyperlipidemia Brother     Allergies  Allergen Reactions  . Sulfa Antibiotics Other (See Comments)    Severe bladder, and kidney infection  . Trazodone And Nefazodone Other (See Comments)    Headache every am    Medication list has been reviewed and updated.  Current Outpatient Medications on File Prior to Visit  Medication Sig Dispense Refill  . ALPRAZolam (XANAX) 0.5 MG tablet Take 0.5-1 tablets (0.25-0.5 mg total) by mouth daily as needed for anxiety. 30 tablet 0  . levothyroxine (SYNTHROID, LEVOTHROID) 125 MCG tablet Take 112 mcg by mouth daily before breakfast.     . traMADol (ULTRAM) 50 MG tablet Take 1 tablet (50 mg total) 2 (two) times daily by mouth. 20 tablet 0  . meloxicam (MOBIC) 7.5 MG tablet Take 1 tablet (7.5 mg total)  by mouth daily. (Patient not taking: Reported on 12/19/2016) 30 tablet 3   No current facility-administered medications on file prior to visit.     ROS ROS otherwise unremarkable unless listed above.  Physical Examination: Pulse 86   Temp 98.1 F (36.7 C) (Oral)   Resp 16   Ht 5\' 6"  (1.676 m)   Wt 170 lb 3.2 oz (77.2 kg)   LMP 12/09/2016   SpO2 98%   BMI 27.47 kg/m  Ideal Body Weight: Weight in (lb) to have BMI = 25: 154.6  Physical Exam  Constitutional: She is oriented to person, place, and time. She appears well-developed and well-nourished. No distress.  HENT:  Head: Normocephalic and atraumatic.  Right Ear: Tympanic membrane, external ear and ear canal normal.  Left Ear: Tympanic membrane, external ear and ear canal normal.  Nose: Mucosal edema (mild) present. No rhinorrhea. Right sinus exhibits no maxillary sinus tenderness and no frontal sinus tenderness. Left sinus exhibits no maxillary sinus tenderness and no frontal sinus tenderness.  Mouth/Throat: No uvula swelling. No oropharyngeal exudate, posterior oropharyngeal edema or posterior oropharyngeal erythema.  Eyes: Conjunctivae and EOM are normal. Pupils are equal, round, and reactive to light.  Cardiovascular: Normal rate and regular rhythm. Exam reveals no gallop, no distant heart sounds and no friction rub.  No murmur heard. Pulmonary/Chest: Effort normal. No respiratory distress. She has no decreased breath sounds. She has no wheezes. She has no rhonchi.  Lymphadenopathy:       Head (right side): No submandibular, no tonsillar, no preauricular and no posterior auricular adenopathy present.       Head (left side): No submandibular, no tonsillar, no preauricular and no posterior auricular adenopathy present.  Neurological: She is alert and oriented to person, place, and time.  Skin: She is not diaphoretic.  Left breast superior area of the areola is a about a 1cm circular area of dryness.  Non-raised.  Minimal flaking.   Breast tissue does not appear inflamed erythematous, or change in obvious skin change comparatively.    Psychiatric: She has a normal mood and affect. Her behavior is normal.     Assessment and Plan: Kara Hall is a 46 y.o. female who is here today for cc of  Chief Complaint  Patient presents with  . Rash    patient c/o rash on L breast x several months, states that she has been scratching until raw  . Cough    patient states that she has cough, would also like refill of nasal spray   This appears  more dry.  Will treat very localized with the lotrisone no longer than 2 weeks.   Advised to follow up with pcp for additional refills of controlled meds.  Has improvement with the flonase and the tessalon pearls, so will refill at this time.  Nasal congestion - Plan: fluticasone (FLONASE) 50 MCG/ACT nasal spray, benzonatate (TESSALON) 100 MG capsule  Rash - Plan: clotrimazole-betamethasone (LOTRISONE) cream  Ivar Drape, PA-C [facility] 12/14/20188:38 AM

## 2016-12-19 NOTE — Patient Instructions (Signed)
     IF you received an x-ray today, you will receive an invoice from [facility]. Please contact [facility] at [phone] with questions or concerns regarding your invoice.   IF you received labwork today, you will receive an invoice from [facility]. Please contact [facility] at [phone] with questions or concerns regarding your invoice.   Our billing staff will not be able to assist you with questions regarding bills from these companies.  You will be contacted with the lab results as soon as they are available. The fastest way to get your results is to activate your My Chart account. Instructions are located on the last page of this paperwork. If you have not heard from us regarding the results in 2 weeks, please contact this office.     

## 2017-01-02 ENCOUNTER — Other Ambulatory Visit: Payer: Self-pay

## 2017-01-02 ENCOUNTER — Ambulatory Visit: Payer: BLUE CROSS/BLUE SHIELD | Admitting: Physician Assistant

## 2017-01-02 ENCOUNTER — Encounter: Payer: Self-pay | Admitting: Physician Assistant

## 2017-01-02 VITALS — BP 114/78 | HR 92 | Temp 97.8°F | Resp 18 | Ht 66.0 in | Wt 166.4 lb

## 2017-01-02 DIAGNOSIS — M25532 Pain in left wrist: Secondary | ICD-10-CM

## 2017-01-02 DIAGNOSIS — R21 Rash and other nonspecific skin eruption: Secondary | ICD-10-CM | POA: Diagnosis not present

## 2017-01-02 DIAGNOSIS — F4323 Adjustment disorder with mixed anxiety and depressed mood: Secondary | ICD-10-CM

## 2017-01-02 DIAGNOSIS — S92352G Displaced fracture of fifth metatarsal bone, left foot, subsequent encounter for fracture with delayed healing: Secondary | ICD-10-CM | POA: Diagnosis not present

## 2017-01-02 DIAGNOSIS — F419 Anxiety disorder, unspecified: Secondary | ICD-10-CM | POA: Diagnosis not present

## 2017-01-02 MED ORDER — TRAMADOL HCL 50 MG PO TABS: 50.0000 mg | ORAL_TABLET | Freq: Every day | ORAL | 0 refills | Status: DC | PRN

## 2017-01-02 MED ORDER — TRIAMCINOLONE ACETONIDE 0.1 % EX CREA: 1.0000 "application " | TOPICAL_CREAM | Freq: Two times a day (BID) | CUTANEOUS | 0 refills | Status: DC

## 2017-01-02 MED ORDER — MELOXICAM 15 MG PO TABS: 15.0000 mg | ORAL_TABLET | Freq: Every day | ORAL | 5 refills | Status: DC

## 2017-01-02 MED ORDER — ALPRAZOLAM 0.5 MG PO TABS: 0.2500 mg | ORAL_TABLET | Freq: Every day | ORAL | 0 refills | Status: DC | PRN

## 2017-01-02 NOTE — Progress Notes (Signed)
Patient ID: Kara Hall, female    DOB: Oct 10, 1970, 46 y.o.   MRN: [id number]  PCP: Harrison Mons, PA-C  Chief Complaint  Patient presents with  . Rash    Pt states rash looks better but still has some itching and is still using the cream.  . Follow-up  . Medication Refill    Tramadol 50 MG, Xanax 0.5    Subjective:   Presents for evaluation of rash and for prescription refills.  Rash on the areola of the LEFT breast began this past summer. She was evaluated here 12/19/2016 by a colleague and prescribed Lotrisone. She notes some improvement in the itching, but itching persists.  History of thyroid cancer, s/p thyroidectomy, managed by Dr. Delrae Rend. She has labs Q3 months and annual visits.  Father died in 05-29-2016. She divorced in June, remarried in July. Her mother's health is declining. She and her children are facing possible eviction. She uses alprazolam at bedtime to help sleep.  Pain is from multiple sources.  Recall that she broke the left fifth metatarsal in 2022-05-30.  She also has some carpal tunnel syndrome type pain of the left wrist.  Review of Systems As above.    Patient Active Problem List   Diagnosis Date Noted  . Achilles tendon contracture, left 10/04/2016  . Displaced fracture of fifth metatarsal bone of left foot with delayed healing 05/07/2016  . Adjustment disorder with mixed anxiety and depressed mood 11/28/2015  . Wrist pain, left 11/28/2015  . Primary thyroid cancer ([place]) 08/09/2013  . Anxiety 01/12/2013     Prior to Admission medications   Medication Sig Start Date End Date Taking? Authorizing Provider  ALPRAZolam [name]) 0.5 MG tablet Take 0.5-1 tablets (0.25-0.5 mg total) by mouth daily as needed for anxiety. 08/20/16  Yes English, Colletta Maryland D, PA  benzonatate (TESSALON) 100 MG capsule Take 1-2 capsules (100-200 mg total) by mouth 3 (three) times daily as needed for cough. 12/19/16  Yes English, Colletta Maryland D, PA    clotrimazole-betamethasone (LOTRISONE) cream Apply 1 application topically 2 (two) times daily. No longer than 2 weeks, and only at rash 12/19/16  Yes English, Stephanie D, PA  fluticasone (FLONASE) 50 MCG/ACT nasal spray Place 2 sprays into both nostrils daily. 12/19/16  Yes English, Colletta Maryland D, PA  levothyroxine (SYNTHROID, LEVOTHROID) 125 MCG tablet Take 112 mcg by mouth daily before breakfast.    Yes [provider]  meloxicam (MOBIC) 7.5 MG tablet Take 1 tablet (7.5 mg total) by mouth daily. 10/10/15  Yes Jakin Pavao, PA-C  traMADol (ULTRAM) 50 MG tablet Take 1 tablet (50 mg total) 2 (two) times daily by mouth. 11/27/16  Yes Newt Minion, MD     Allergies  Allergen Reactions  . Sulfa Antibiotics Other (See Comments)    Severe bladder, and kidney infection  . Trazodone And Nefazodone Other (See Comments)    Headache every am       Objective:  Physical Exam  Constitutional: She is oriented to person, place, and time. She appears well-developed and well-nourished. She is active and cooperative. No distress.  BP 114/78 (BP Location: Left Arm, Patient Position: Sitting, Cuff Size: Normal)   Pulse 92   Temp 97.8 F (36.6 C) (Oral)   Resp 18   Ht 5\' 6"  (1.676 m)   Wt 166 lb 6.4 oz (75.5 kg)   LMP 12/09/2016   SpO2 98%   BMI 26.86 kg/m   HENT:  Head: Normocephalic and atraumatic.  Right Ear: Hearing normal.  Left Ear: Hearing normal.  Eyes: Conjunctivae are normal. No scleral icterus.  Neck: Normal range of motion. Neck supple. No thyromegaly present.  Cardiovascular: Normal rate, regular rhythm and normal heart sounds.  Pulses:      Radial pulses are 2+ on the right side, and 2+ on the left side.  Pulmonary/Chest: Effort normal and breath sounds normal.  Lymphadenopathy:       Head (right side): No tonsillar, no preauricular, no posterior auricular and no occipital adenopathy present.       Head (left side): No tonsillar, no preauricular, no posterior auricular  and no occipital adenopathy present.    She has no cervical adenopathy.       Right: No supraclavicular adenopathy present.       Left: No supraclavicular adenopathy present.  Neurological: She is alert and oriented to person, place, and time. No sensory deficit.  Skin: Skin is warm, dry and intact. Rash ( Left breast, 1 cm annular lesion with mild scale) noted. No cyanosis or erythema. Nails show no clubbing.  Psychiatric: She has a normal mood and affect. Her speech is normal and behavior is normal.        Assessment & Plan:   Problem List Items Addressed This Visit    Anxiety    Aggravated by current stressors at home.  Not interested in additional medications.  Advised caution with regular benzodiazepine use.      Relevant Medications   ALPRAZolam (XANAX) 0.5 MG tablet   Adjustment disorder with mixed anxiety and depressed mood   Relevant Medications   ALPRAZolam (XANAX) 0.5 MG tablet   Wrist pain, left   Relevant Medications   meloxicam (MOBIC) 15 MG tablet   Displaced fracture of fifth metatarsal bone of left foot with delayed healing    Refilled both tramadol and meloxicam.  Recommend that she follow-up with orthopedics to continued pain requiring more than nonsteroidal anti-inflammatories.      Relevant Medications   traMADol (ULTRAM) 50 MG tablet   meloxicam (MOBIC) 15 MG tablet    Other Visit Diagnoses    Rash    -  Primary   Some improvement with Lotrisone cream.  Trial of higher potency steroid cream.   Relevant Medications   triamcinolone cream (KENALOG) 0.1 %       Return in about 6 months (around 07/03/2017) for re-evalaution of mood and pain, sooner if needed.   Fara Chute, PA-C [facility] at [facility]

## 2017-01-02 NOTE — Progress Notes (Signed)
Subjective:    Patient ID: Kara Hall, female    DOB: 1970-12-15, 46 y.o.   MRN: [id number]  HPI Patient presents for evaluation of a rash and medication refill.  Patient has a round area on her left breast that is itchy. Patient noticed this area this year during the summer. No pain in the area. Patient saw Ivar Drape on 12/19/16 and was given Lotrisone cream for her symptoms. She feel like the cream has been working but she continues to have itching in the area. She has been placing the cream on a Band-Aid and placing it in the affected area. No new detergents, soaps or lotions.   Patient takes xanax mostly at night to help her sleep. She endorses increased stressors. She lost her dad in 04/2016, she and her children are facing possible eviction from her homes, and her mother has been having health problems.  Patient is taking all her medications and is tolerating well. Dr. Minette Brine is her endocrinologist who manages her hypothyroidism. She sees him once a year, but gets lab work every 3 months.    She eats three times a day and tries to eat healthy. She exercises three times a week for about 40 minutes.   Patient needs her Tramadol and Xanax refilled. She takes Tramadol for her wrist and ankle pain.  Review of Systems  Constitutional: Negative.   Respiratory: Positive for cough (using tessalon and vaporizer at night ). Negative for shortness of breath.   Cardiovascular: Negative for chest pain and palpitations.  Gastrointestinal: Negative for abdominal pain, constipation, diarrhea, nausea and vomiting.  Genitourinary: Negative for dysuria, frequency and urgency.  Musculoskeletal: Negative for arthralgias and myalgias.  Skin: Positive for rash (on left breast).  Neurological: Negative for light-headedness and headaches.  Psychiatric/Behavioral: Negative for decreased concentration. The patient is not nervous/anxious.    Patient Active Problem List   Diagnosis Date  Noted  . Achilles tendon contracture, left 10/04/2016  . Displaced fracture of fifth metatarsal bone of left foot with delayed healing 05/07/2016  . Adjustment disorder with mixed anxiety and depressed mood 11/28/2015  . Wrist pain, left 11/28/2015  . Primary thyroid cancer ([place]) 08/09/2013  . Anxiety 01/12/2013   Current Meds  Medication Sig  . ALPRAZolam (XANAX) 0.5 MG tablet Take 0.5-1 tablets (0.25-0.5 mg total) by mouth daily as needed for anxiety.  . benzonatate (TESSALON) 100 MG capsule Take 1-2 capsules (100-200 mg total) by mouth 3 (three) times daily as needed for cough.  . clotrimazole-betamethasone (LOTRISONE) cream Apply 1 application topically 2 (two) times daily. No longer than 2 weeks, and only at rash  . fluticasone (FLONASE) 50 MCG/ACT nasal spray Place 2 sprays into both nostrils daily.  Marland Kitchen levothyroxine (SYNTHROID, LEVOTHROID) 125 MCG tablet Take 112 mcg by mouth daily before breakfast.   . meloxicam (MOBIC) 7.5 MG tablet Take 1 tablet (7.5 mg total) by mouth daily.  . traMADol (ULTRAM) 50 MG tablet Take 1 tablet (50 mg total) 2 (two) times daily by mouth.   Allergies  Allergen Reactions  . Sulfa Antibiotics Other (See Comments)    Severe bladder, and kidney infection  . Trazodone And Nefazodone Other (See Comments)    Headache every am   Social History   Socioeconomic History  . Marital status: Married    Spouse name: [name]  . Number of children: 3  . Years of education: 12+  . Highest education level: Not on file  Social Needs  . Emergency planning/management officer  strain: Not on file  . Food insecurity - worry: Not on file  . Food insecurity - inability: Not on file  . Transportation needs - medical: Not on file  . Transportation needs - non-medical: Not on file  Occupational History  . Occupation: [employer]  . Occupation: Paediatric nurse: [employer]  Tobacco Use  . Smoking status: Never Smoker  . Smokeless tobacco: Never Used  Substance and Sexual  Activity  . Alcohol use: Yes    Comment: Cocktail or glass of wine occasionally  . Drug use: No  . Sexual activity: Yes    Partners: Male    Birth control/protection: Surgical    Comment: Tubal ligation  Other Topics Concern  . Not on file  Social History Narrative   Divorce from previous husband in 06/2016.   Now remarried 07/2016.   Lives at home with her new husband and cat.   She has three grown children who live with her husband. She has a good relationship with her children.   Her father passed away 05/16/16.      Objective:  Vitals:   01/02/17 1019  BP: 114/78  Pulse: 92  Resp: 18  Temp: 97.8 F (36.6 C)  SpO2: 98%     Physical Exam  Constitutional: She is oriented to person, place, and time. She appears well-developed and well-nourished. No distress.  HENT:  Head: Normocephalic and atraumatic.  Nose: Nose normal.  Mouth/Throat: Oropharynx is clear and moist.  Neck: Neck supple. No thyromegaly present.  Cardiovascular: Normal rate, regular rhythm, normal heart sounds and intact distal pulses. Exam reveals no gallop and no friction rub.  No murmur heard. Pulmonary/Chest: Effort normal and breath sounds normal.  Lymphadenopathy:    She has no cervical adenopathy.  Neurological: She is alert and oriented to person, place, and time.  Skin: Skin is warm. Rash (1 cm annular area that looks dry with mild flakiness. No edema or erythema) noted.  Psychiatric: She has a normal mood and affect.      Assessment & Plan:  1. Rash Patient continues to have itching even with the Lotrisone. At this point if this is a fungal or eczematous rash. We will try a steroid cream to see if that helps. If that does not help, performing a skin biopsy would be the next step. - triamcinolone cream (KENALOG) 0.1 %; Apply 1 application topically 2 (two) times daily.  Dispense: 30 g; Refill: 0  2. Anxiety Well controlled. Continue current medication regimen. - ALPRAZolam (XANAX) 0.5 MG tablet;  Take 0.5-1 tablets (0.25-0.5 mg total) by mouth daily as needed for anxiety.  Dispense: 30 tablet; Refill: 0  3. Wrist pain, left - meloxicam (MOBIC) 15 MG tablet; Take 1 tablet (15 mg total) by mouth daily.  Dispense: 30 tablet; Refill: 5  4. Adjustment disorder with mixed anxiety and depressed mood Well controlled. Continue current medication regimen. - ALPRAZolam (XANAX) 0.5 MG tablet; Take 0.5-1 tablets (0.25-0.5 mg total) by mouth daily as needed for anxiety.  Dispense: 30 tablet; Refill: 0  5. Closed displaced fracture of fifth metatarsal bone of left foot with delayed healing, subsequent encounter Patient instructed to take the meloxicam daily, and take the Tramadol only when she needs it. - traMADol (ULTRAM) 50 MG tablet; Take 1 tablet (50 mg total) by mouth daily as needed.  Dispense: 30 tablet; Refill: 0 - meloxicam (MOBIC) 15 MG tablet; Take 1 tablet (15 mg total) by mouth daily.  Dispense: 30 tablet;  Refill: 5  Follow up in 6 months for re-evaluation of mood and pain. Follow up sooner if needed.  [place], [place]

## 2017-01-02 NOTE — Patient Instructions (Addendum)
If the rash persists, we'll plan to perform a biopsy. Take the meloxicam daily. Use the tramadol only if you need it.    IF you received an x-ray today, you will receive an invoice from [facility]. Please contact [facility] at [phone] with questions or concerns regarding your invoice.   IF you received labwork today, you will receive an invoice from [facility]. Please contact [facility] at [phone] with questions or concerns regarding your invoice.   Our billing staff will not be able to assist you with questions regarding bills from these companies.  You will be contacted with the lab results as soon as they are available. The fastest way to get your results is to activate your My Chart account. Instructions are located on the last page of this paperwork. If you have not heard from [place] regarding the results in 2 weeks, please contact this office.

## 2017-01-14 NOTE — Assessment & Plan Note (Signed)
Refilled both tramadol and meloxicam.  Recommend that she follow-up with orthopedics to continued pain requiring more than nonsteroidal anti-inflammatories.

## 2017-01-14 NOTE — Assessment & Plan Note (Signed)
Aggravated by current stressors at home.  Not interested in additional medications.  Advised caution with regular benzodiazepine use.

## 2017-03-08 ENCOUNTER — Other Ambulatory Visit: Payer: Self-pay | Admitting: Physician Assistant

## 2017-03-08 DIAGNOSIS — S92352G Displaced fracture of fifth metatarsal bone, left foot, subsequent encounter for fracture with delayed healing: Secondary | ICD-10-CM

## 2017-03-08 NOTE — Telephone Encounter (Signed)
Copied from [place] [id number]. Topic: Quick Communication - Rx Refill/Question >> Mar 08, 2017 11:10 AM Bea Graff, NT wrote: Medication: traMADol Veatrice Bourbon)    Has the patient contacted their pharmacy? Yes.     (Agent: If no, request that the patient contact the pharmacy for the refill.)   Preferred Pharmacy (with phone number or street name): [facility] on [address]: Please be advised that RX refills may take up to 3 business days. We ask that you follow-up with your pharmacy.

## 2017-03-08 NOTE — Telephone Encounter (Signed)
[place]  LOV 01/02/17  C. Jeffery  Rx. verified

## 2017-03-09 NOTE — Telephone Encounter (Signed)
Please advise 

## 2017-03-10 MED ORDER — TRAMADOL HCL 50 MG PO TABS: 50.0000 mg | ORAL_TABLET | Freq: Every day | ORAL | 0 refills | Status: DC | PRN

## 2017-03-10 NOTE — Telephone Encounter (Signed)
Rx sent electronically.  Meds ordered this encounter  Medications  . traMADol (ULTRAM) 50 MG tablet    Sig: Take 1 tablet (50 mg total) by mouth daily as needed.    Dispense:  30 tablet    Refill:  0

## 2017-03-20 DIAGNOSIS — Z6827 Body mass index (BMI) 27.0-27.9, adult: Secondary | ICD-10-CM | POA: Diagnosis not present

## 2017-03-20 DIAGNOSIS — Z01419 Encounter for gynecological examination (general) (routine) without abnormal findings: Secondary | ICD-10-CM | POA: Diagnosis not present

## 2017-03-20 DIAGNOSIS — Z124 Encounter for screening for malignant neoplasm of cervix: Secondary | ICD-10-CM | POA: Diagnosis not present

## 2017-03-20 LAB — HM PAP SMEAR

## 2017-04-09 DIAGNOSIS — E890 Postprocedural hypothyroidism: Secondary | ICD-10-CM | POA: Diagnosis not present

## 2017-04-09 DIAGNOSIS — R635 Abnormal weight gain: Secondary | ICD-10-CM | POA: Diagnosis not present

## 2017-04-09 DIAGNOSIS — Z85850 Personal history of malignant neoplasm of thyroid: Secondary | ICD-10-CM | POA: Diagnosis not present

## 2017-04-10 DIAGNOSIS — R635 Abnormal weight gain: Secondary | ICD-10-CM | POA: Diagnosis not present

## 2017-04-18 ENCOUNTER — Encounter: Payer: Self-pay | Admitting: Physician Assistant

## 2017-04-24 ENCOUNTER — Encounter: Payer: Self-pay | Admitting: Physician Assistant

## 2017-05-16 ENCOUNTER — Encounter: Payer: Self-pay | Admitting: *Deleted

## 2017-05-23 DIAGNOSIS — E890 Postprocedural hypothyroidism: Secondary | ICD-10-CM | POA: Diagnosis not present

## 2017-06-03 ENCOUNTER — Ambulatory Visit (INDEPENDENT_AMBULATORY_CARE_PROVIDER_SITE_OTHER): Payer: BLUE CROSS/BLUE SHIELD

## 2017-06-03 ENCOUNTER — Ambulatory Visit: Payer: BLUE CROSS/BLUE SHIELD | Admitting: Physician Assistant

## 2017-06-03 ENCOUNTER — Other Ambulatory Visit: Payer: Self-pay

## 2017-06-03 ENCOUNTER — Encounter: Payer: Self-pay | Admitting: Physician Assistant

## 2017-06-03 VITALS — BP 188/88 | HR 83 | Temp 98.4°F | Resp 16 | Ht 66.0 in | Wt 163.8 lb

## 2017-06-03 DIAGNOSIS — F419 Anxiety disorder, unspecified: Secondary | ICD-10-CM

## 2017-06-03 DIAGNOSIS — M25531 Pain in right wrist: Secondary | ICD-10-CM

## 2017-06-03 DIAGNOSIS — S92352G Displaced fracture of fifth metatarsal bone, left foot, subsequent encounter for fracture with delayed healing: Secondary | ICD-10-CM

## 2017-06-03 DIAGNOSIS — L0390 Cellulitis, unspecified: Secondary | ICD-10-CM

## 2017-06-03 DIAGNOSIS — M25532 Pain in left wrist: Secondary | ICD-10-CM

## 2017-06-03 DIAGNOSIS — F4323 Adjustment disorder with mixed anxiety and depressed mood: Secondary | ICD-10-CM | POA: Diagnosis not present

## 2017-06-03 MED ORDER — MELOXICAM 15 MG PO TABS: 15.0000 mg | ORAL_TABLET | Freq: Every day | ORAL | 5 refills | Status: DC

## 2017-06-03 MED ORDER — ALPRAZOLAM 0.5 MG PO TABS: 0.2500 mg | ORAL_TABLET | Freq: Every day | ORAL | 0 refills | Status: DC | PRN

## 2017-06-03 MED ORDER — TRAMADOL HCL 50 MG PO TABS: 50.0000 mg | ORAL_TABLET | Freq: Every day | ORAL | 0 refills | Status: DC | PRN

## 2017-06-03 MED ORDER — MUPIROCIN 2 % EX OINT: 1.0000 "application " | TOPICAL_OINTMENT | Freq: Three times a day (TID) | CUTANEOUS | 1 refills | Status: DC

## 2017-06-03 NOTE — Assessment & Plan Note (Signed)
Stable.  Continue as needed use of alprazolam.

## 2017-06-03 NOTE — Progress Notes (Signed)
Patient ID: Kara Hall, female    DOB: 26-Aug-1970, 47 y.o.   MRN: [id number]  PCP: Alden Hipp, MD  Chief Complaint  Patient presents with  . Wrist Pain    both wrist getting worse     Subjective:   Presents for evaluation of wrist pain.  Levothyroxine dose change recently with endocrinology.  In December, she reported LEFT wrist pain. She was prescribed meloxicam and tramadol (also for foot pain, s/p metatarsal fracture).  Also provided with a left cock up wrist splint.  When she wears it, it is very helpful.  Unfortunately her coworkers asked her about it frequently ("what happened to your wrist?")  And her supervisor has questioned whether or not a wrist splint use is reducing her efficiency at work.  As such, she does not wear it all the time.  Since then, she has had worsening left wrist pain and now also experiencing right wrist pain.  She describes a burning or tingling sensation that extends into the palm of her hands.  Sometimes her hands feel sensitive even to light touch.  She is left-hand dominant.  Started to develop a new boil in the vaginal area. Used leftover mupirocin with relief. Requests a refill.   Health Maintenance Due  Topic Date Due  . HIV Screening  11/14/1986     Review of Systems As above.    Patient Active Problem List   Diagnosis Date Noted  . Achilles tendon contracture, left 10/04/2016  . Displaced fracture of fifth metatarsal bone of left foot with delayed healing 05/07/2016  . Adjustment disorder with mixed anxiety and depressed mood 11/28/2015  . Wrist pain, left 11/28/2015  . History of thyroid cancer 08/09/2013  . Anxiety 01/12/2013     Prior to Admission medications   Medication Sig Start Date End Date Taking? Authorizing Provider  ALPRAZolam [name]) 0.5 MG tablet Take 0.5-1 tablets (0.25-0.5 mg total) by mouth daily as needed for anxiety. 01/02/17  Yes Luretha Eberly, PA-C  fluticasone (FLONASE) 50 MCG/ACT  nasal spray Place 2 sprays into both nostrils daily. 12/19/16  Yes English, Colletta Maryland D, PA  levothyroxine (SYNTHROID, LEVOTHROID) 125 MCG tablet Take 112 mcg by mouth daily before breakfast.    Yes [provider]  meloxicam (MOBIC) 15 MG tablet Take 1 tablet (15 mg total) by mouth daily. 01/02/17  Yes Georganne Siple, PA-C  traMADol (ULTRAM) 50 MG tablet Take 1 tablet (50 mg total) by mouth daily as needed. 03/10/17  Yes Sharilynn Cassity, PA-C  benzonatate (TESSALON) 100 MG capsule Take 1-2 capsules (100-200 mg total) by mouth 3 (three) times daily as needed for cough. Patient not taking: Reported on 06/03/2017 12/19/16   Ivar Drape D, PA  triamcinolone cream (KENALOG) 0.1 % Apply 1 application topically 2 (two) times daily. Patient not taking: Reported on 06/03/2017 01/02/17   Harrison Mons, PA-C     Allergies  Allergen Reactions  . Sulfa Antibiotics Other (See Comments) and Nausea And Vomiting    Severe bladder, and kidney infection  . Trazodone And Nefazodone Other (See Comments)    Headache every am Other reaction(s): Other Headache every am       Objective:  Physical Exam  Constitutional: She is oriented to person, place, and time. She appears well-developed and well-nourished. She is active and cooperative. No distress.  BP (!) 188/88   Pulse 83   Temp 98.4 F (36.9 C)   Resp 16   Ht 5\' 6"  (1.676 m)  Wt 163 lb 12.8 oz (74.3 kg)   SpO2 98%   BMI 26.44 kg/m    Eyes: Conjunctivae are normal.  Pulmonary/Chest: Effort normal.  Musculoskeletal:       Right elbow: Normal.      Left elbow: Normal.       Right wrist: Normal.       Left wrist: Normal.       Right forearm: Normal.       Left forearm: Normal.       Right hand: Normal. Normal sensation noted. Normal strength noted.       Left hand: Normal. Normal sensation noted. Normal strength noted.  Neurological: She is alert and oriented to person, place, and time.  Phalen's and Tinel's reproduce symptoms  bilaterally.  Psychiatric: She has a normal mood and affect. Her speech is normal and behavior is normal.    BP Readings from Last 3 Encounters:  06/03/17 (!) 188/88  01/02/17 114/78  12/19/16 118/84       Assessment & Plan:   Problem List Items Addressed This Visit    Anxiety    Stable.  Continue as needed use of alprazolam.      Relevant Medications   ALPRAZolam (XANAX) 0.5 MG tablet   Adjustment disorder with mixed anxiety and depressed mood    Stable.  Continue alprazolam as needed.      Relevant Medications   ALPRAZolam (XANAX) 0.5 MG tablet   Wrist pain, left    Bilateral wrist pain now.  Add wrist splint for the right.  Suspect carpal tunnel syndrome.  Await radiographs.  If she is unable to wear the splints at work due to no seat/suspicious coworkers, wear them every night to sleep.      Relevant Medications   meloxicam (MOBIC) 15 MG tablet   traMADol (ULTRAM) 50 MG tablet   Displaced fracture of fifth metatarsal bone of left foot with delayed healing    Continues to have pain in the foot after long shifts at work which requires standing.  Continue as needed use of tramadol.      Relevant Medications   traMADol (ULTRAM) 50 MG tablet    Other Visit Diagnoses    Bilateral wrist pain    -  Primary   Relevant Orders   DG Wrist Complete Left (Completed)   DG Wrist Complete Right (Completed)   Cellulitis, unspecified cellulitis site       Relevant Medications   mupirocin ointment (BACTROBAN) 2 %       Return in about 1 month (around 07/01/2017) for re-evaluation of wrist pain with Dr. Pamella Pert.   Fara Chute, PA-C [facility] [place]

## 2017-06-03 NOTE — Patient Instructions (Addendum)
Next time you have blood drawn, ask that they also do HIV screening (to satisfy the CDC recommendations. They can use SCREENING FOR HIV as the diagnosis code).  Wear the wrist splints as much as you can. If you cannot wear them at work, at least wear them at night. Get a wrist splint for the RIGHT wrist at your local pharmacy.  IF you received an x-ray today, you will receive an invoice from [facility]. Please contact [facility] at [phone] with questions or concerns regarding your invoice.   IF you received labwork today, you will receive an invoice from [facility]. Please contact [facility] at [phone] with questions or concerns regarding your invoice.   Our billing staff will not be able to assist you with questions regarding bills from these companies.  You will be contacted with the lab results as soon as they are available. The fastest way to get your results is to activate your My Chart account. Instructions are located on the last page of this paperwork. If you have not heard from [place] regarding the results in 2 weeks, please contact this office.

## 2017-06-03 NOTE — Assessment & Plan Note (Signed)
Stable.  Continue alprazolam as needed. 

## 2017-06-03 NOTE — Assessment & Plan Note (Signed)
Continues to have pain in the foot after long shifts at work which requires standing.  Continue as needed use of tramadol.

## 2017-06-03 NOTE — Assessment & Plan Note (Signed)
Bilateral wrist pain now.  Add wrist splint for the right.  Suspect carpal tunnel syndrome.  Await radiographs.  If she is unable to wear the splints at work due to no seat/suspicious coworkers, wear them every night to sleep.

## 2017-07-03 ENCOUNTER — Ambulatory Visit: Payer: Self-pay | Admitting: Family Medicine

## 2017-08-29 DIAGNOSIS — E890 Postprocedural hypothyroidism: Secondary | ICD-10-CM | POA: Diagnosis not present

## 2017-08-29 DIAGNOSIS — Z85850 Personal history of malignant neoplasm of thyroid: Secondary | ICD-10-CM | POA: Diagnosis not present

## 2017-10-01 DIAGNOSIS — E890 Postprocedural hypothyroidism: Secondary | ICD-10-CM | POA: Diagnosis not present

## 2017-10-01 DIAGNOSIS — R635 Abnormal weight gain: Secondary | ICD-10-CM | POA: Diagnosis not present

## 2017-10-01 DIAGNOSIS — Z85850 Personal history of malignant neoplasm of thyroid: Secondary | ICD-10-CM | POA: Diagnosis not present

## 2017-10-01 DIAGNOSIS — E663 Overweight: Secondary | ICD-10-CM | POA: Diagnosis not present

## 2017-10-23 DIAGNOSIS — Z23 Encounter for immunization: Secondary | ICD-10-CM | POA: Diagnosis not present

## 2017-12-10 ENCOUNTER — Encounter: Payer: Self-pay | Admitting: Emergency Medicine

## 2017-12-10 ENCOUNTER — Other Ambulatory Visit: Payer: Self-pay

## 2017-12-10 ENCOUNTER — Ambulatory Visit: Payer: BLUE CROSS/BLUE SHIELD | Admitting: Emergency Medicine

## 2017-12-10 VITALS — BP 115/76 | HR 79 | Temp 98.3°F | Ht 65.5 in | Wt 168.0 lb

## 2017-12-10 DIAGNOSIS — M25532 Pain in left wrist: Secondary | ICD-10-CM | POA: Diagnosis not present

## 2017-12-10 DIAGNOSIS — G5603 Carpal tunnel syndrome, bilateral upper limbs: Secondary | ICD-10-CM | POA: Insufficient documentation

## 2017-12-10 DIAGNOSIS — M25531 Pain in right wrist: Secondary | ICD-10-CM

## 2017-12-10 MED ORDER — TRAMADOL HCL 50 MG PO TABS: 50.0000 mg | ORAL_TABLET | Freq: Every day | ORAL | 0 refills | Status: DC | PRN

## 2017-12-10 NOTE — Patient Instructions (Addendum)
     If you have lab work done today you will be contacted with your lab results within the next 2 weeks.  If you have not heard from [place] then please contact us. The fastest way to get your results is to register for My Chart.   IF you received an x-ray today, you will receive an invoice from [facility]. Please contact [facility] at [phone] with questions or concerns regarding your invoice.   IF you received labwork today, you will receive an invoice from [facility]. Please contact [facility] at [phone] with questions or concerns regarding your invoice.   Our billing staff will not be able to assist you with questions regarding bills from these companies.  You will be contacted with the lab results as soon as they are available. The fastest way to get your results is to activate your My Chart account. Instructions are located on the last page of this paperwork. If you have not heard from [place] regarding the results in 2 weeks, please contact this office.      Carpal Tunnel Syndrome Carpal tunnel syndrome is a condition that causes pain in your hand and arm. The carpal tunnel is a narrow area that is on the palm side of your wrist. Repeated wrist motion or certain diseases may cause swelling in the tunnel. This swelling can pinch the main nerve in the wrist (median nerve). Follow these instructions at home: If you have a splint:  Wear it as told by your doctor. Remove it only as told by your doctor.  Loosen the splint if your fingers: ? Become numb and tingle. ? Turn blue and cold.  Keep the splint clean and dry. General instructions  Take over-the-counter and prescription medicines only as told by your doctor.  Rest your wrist from any activity that may be causing your pain. If needed, talk to your employer about changes that can be made in your work, such as getting a wrist pad to use while typing.  If directed, apply ice to the painful area: ? Put ice in a  plastic bag. ? Place a towel between your skin and the bag. ? Leave the ice on for 20 minutes, 2-3 times per day.  Keep all follow-up visits as told by your doctor. This is important.  Do any exercises as told by your doctor, physical therapist, or occupational therapist. Contact a doctor if:  You have new symptoms.  Medicine does not help your pain.  Your symptoms get worse. This information is not intended to replace advice given to you by your health care provider. Make sure you discuss any questions you have with your health care provider. Document Released: 12/21/2010 Document Revised: 06/09/2015 Document Reviewed: 05/19/2014 Elsevier Interactive Patient Education  [employer].

## 2017-12-10 NOTE — Progress Notes (Signed)
Kara Hall 47 y.o.   Chief Complaint  Patient presents with  . both wrist pain    numbness and tingling down to the fingers   . Medication Refill    tamadol for wrist pain    HISTORY OF PRESENT ILLNESS: This is a 47 y.o. female with history of chronic bilateral wrist pain for at least 6 years here for follow-up and medication refill.  Requesting refill of tramadol.  Has been a patient of PA Jeffery.  First visit with me. States pain is worse after work she also gets occasional numbness and tingling down the fingers in both hands.  No new symptoms.  No other associated symptomatology.  HPI   Prior to Admission medications   Medication Sig Start Date End Date Taking? Authorizing Provider  ALPRAZolam [name]) 0.5 MG tablet Take 0.5-1 tablets (0.25-0.5 mg total) by mouth daily as needed for anxiety. 06/03/17  Yes Jeffery, Chelle, PA  fluticasone (FLONASE) 50 MCG/ACT nasal spray Place 2 sprays into both nostrils daily. 12/19/16  Yes English, Colletta Maryland D, PA  levothyroxine (SYNTHROID, LEVOTHROID) 125 MCG tablet Take 125 mcg by mouth daily before breakfast.    Yes [provider]  meloxicam (MOBIC) 15 MG tablet Take 1 tablet (15 mg total) by mouth daily. 06/03/17  Yes Jeffery, Domingo Mend, PA  mupirocin ointment (BACTROBAN) 2 % Apply 1 application topically 3 (three) times daily. 06/03/17  Yes Jeffery, Chelle, PA  traMADol (ULTRAM) 50 MG tablet Take 1 tablet (50 mg total) by mouth daily as needed. 06/03/17  Yes Harrison Mons, PA    Allergies  Allergen Reactions  . Sulfa Antibiotics Other (See Comments) and Nausea And Vomiting    Severe bladder, and kidney infection  . Trazodone And Nefazodone Other (See Comments)    Headache every am Other reaction(s): Other Headache every am    Patient Active Problem List   Diagnosis Date Noted  . Achilles tendon contracture, left 10/04/2016  . Displaced fracture of fifth metatarsal bone of left foot with delayed healing 05/07/2016  .  Adjustment disorder with mixed anxiety and depressed mood 11/28/2015  . Wrist pain, left 11/28/2015  . History of thyroid cancer 08/09/2013  . Anxiety 01/12/2013    Past Medical History:  Diagnosis Date  . Anemia   . Anxiety    panic attack, also reports problems with depression, relative to her job & upcoming surgery , relative to life problems - with children & spouse    . Back complaints 2013   has had steroid injection   . Depression   . DVT (deep venous thrombosis) ([place]) 2001   RLE  . Foot fracture, left   . Migraine    h/o migraines - as a teenager  & again now with family issues, states she has to lay down & rest in a dark room  (08/28/2013)  . Muscle strain of hand    wearing home made splint today, L hand  . Peripheral vascular disease (H[facility])    DVT- per pt. in 2001- had vein removed.   . Refusal of blood transfusions as patient is Jehovah's Witness   . Thyroid cancer ([place])   . Thyroid nodule     Past Surgical History:  Procedure Laterality Date  . THROMBECTOMY Right 2001   leg; after 3rd child was born; for deep vein thrombosis  . THYROIDECTOMY Right 08/28/2013   Procedure: RIGHT THYROID LOBECTOMY POSSIBLE TOTAL THYROIDECTOMY;  Surgeon: Jodi Marble, MD;  Location: [facility];  Service: ENT;  Laterality:  Right;  Marland Kitchen THYROIDECTOMY Left 09/10/2013   Procedure: COMPLETE LEFT THYROIDECTOMY;  Surgeon: Jodi Marble, MD;  Location: [facility];  Service: ENT;  Laterality: Left;  . TUBAL LIGATION  2001    Social History   Socioeconomic History  . Marital status: Married    Spouse name: [name]  . Number of children: 3  . Years of education: 12+  . Highest education level: Not on file  Occupational History  . Occupation: [employer]  . Occupation: Paediatric nurse: [employer]  . Financial resource strain: Not on file  . Food insecurity:    Worry: Not on file    Inability: Not on file  . Transportation needs:    Medical: Not on file    Non-medical:  Not on file  Tobacco Use  . Smoking status: Never Smoker  . Smokeless tobacco: Never Used  Substance and Sexual Activity  . Alcohol use: Yes    Comment: Cocktail or glass of wine occasionally  . Drug use: No  . Sexual activity: Yes    Partners: Male    Birth control/protection: Surgical    Comment: Tubal ligation  Lifestyle  . Physical activity:    Days per week: Not on file    Minutes per session: Not on file  . Stress: Not on file  Relationships  . Social connections:    Talks on phone: Not on file    Gets together: Not on file    Attends religious service: Not on file    Active member of club or organization: Not on file    Attends meetings of clubs or organizations: Not on file    Relationship status: Not on file  . Intimate partner violence:    Fear of current or ex partner: Not on file    Emotionally abused: Not on file    Physically abused: Not on file    Forced sexual activity: Not on file  Other Topics Concern  . Not on file  Social History Narrative   Divorce from previous husband in 06/2016.   Now remarried 07/2016.   Lives at home with her new husband and cat.   She has three grown children who live with her husband. She has a good relationship with her children.   Her father passed away 2016/06/06.    Family History  Problem Relation Age of Onset  . Heart disease Mother   . Hyperlipidemia Mother   . Heart disease Father   . Hyperlipidemia Father   . Diabetes Father   . Heart disease Brother   . Hyperlipidemia Brother   . Hypertension Brother   . Heart disease Brother   . Hyperlipidemia Brother      Review of Systems  Constitutional: Negative.  Negative for chills and fever.  HENT: Negative.   Eyes: Negative.   Respiratory: Negative.  Negative for cough and shortness of breath.   Cardiovascular: Negative.   Gastrointestinal: Negative.  Negative for abdominal pain, nausea and vomiting.  Genitourinary: Negative.   Musculoskeletal: Positive for joint  pain (wrists).  Skin: Negative.  Negative for rash.  Neurological: Negative.  Negative for dizziness and headaches.  Endo/Heme/Allergies: Negative.   All other systems reviewed and are negative.   Vitals:   12/10/17 1034  BP: 115/76  Pulse: 79  Temp: 98.3 F (36.8 C)  SpO2: 95%    Physical Exam  Constitutional: She is oriented to person, place, and time. She appears well-developed and well-nourished.  HENT:  Head: Normocephalic and atraumatic.  Eyes: Pupils are equal, round, and reactive to light. EOM are normal.  Neck: Normal range of motion.  Cardiovascular: Normal rate.  Pulmonary/Chest: Effort normal.  Musculoskeletal:  Wrists/hands: Right: Mild swelling, no bruising or erythema, full range of motion, no significant tenderness to palpation.  Less grip than left.  Left: Some swelling and mild tenderness to palpation of volar wrist.  No erythema or bruising.  Full range of motion.  Good grip.  Neurological: She is alert and oriented to person, place, and time.  Skin: Skin is warm. Capillary refill takes less than 2 seconds.  Psychiatric: She has a normal mood and affect. Her behavior is normal.  Vitals reviewed.   A total of 25 minutes was spent in the room with the patient, greater than 50% of which was in counseling/coordination of care regarding differential diagnosis, treatment, medications, and need for follow-up with orthopedist/hand surgeon for further diagnostic work-up.  ASSESSMENT & PLAN: Kara Hall was seen today for both wrist pain and medication refill.  Diagnoses and all orders for this visit:  Bilateral wrist pain -     traMADol (ULTRAM) 50 MG tablet; Take 1 tablet (50 mg total) by mouth daily as needed. -     Ambulatory referral to Orthopedic Surgery  Carpal tunnel syndrome on both sides -     Ambulatory referral to Orthopedic Surgery    Patient Instructions       If you have lab work done today you will be contacted with your lab results within the  next 2 weeks.  If you have not heard from [place] then please contact us. The fastest way to get your results is to register for My Chart.   IF you received an x-ray today, you will receive an invoice from [facility]. Please contact [facility] at [phone] with questions or concerns regarding your invoice.   IF you received labwork today, you will receive an invoice from [facility]. Please contact [facility] at [phone] with questions or concerns regarding your invoice.   Our billing staff will not be able to assist you with questions regarding bills from these companies.  You will be contacted with the lab results as soon as they are available. The fastest way to get your results is to activate your My Chart account. Instructions are located on the last page of this paperwork. If you have not heard from [place] regarding the results in 2 weeks, please contact this office.      Carpal Tunnel Syndrome Carpal tunnel syndrome is a condition that causes pain in your hand and arm. The carpal tunnel is a narrow area that is on the palm side of your wrist. Repeated wrist motion or certain diseases may cause swelling in the tunnel. This swelling can pinch the main nerve in the wrist (median nerve). Follow these instructions at home: If you have a splint:  Wear it as told by your doctor. Remove it only as told by your doctor.  Loosen the splint if your fingers: ? Become numb and tingle. ? Turn blue and cold.  Keep the splint clean and dry. General instructions  Take over-the-counter and prescription medicines only as told by your doctor.  Rest your wrist from any activity that may be causing your pain. If needed, talk to your employer about changes that can be made in your work, such as getting a wrist pad to use while typing.  If directed, apply ice to the painful area: ?  Put ice in a plastic bag. ? Place a towel between your skin and the bag. ? Leave the ice on for 20  minutes, 2-3 times per day.  Keep all follow-up visits as told by your doctor. This is important.  Do any exercises as told by your doctor, physical therapist, or occupational therapist. Contact a doctor if:  You have new symptoms.  Medicine does not help your pain.  Your symptoms get worse. This information is not intended to replace advice given to you by your health care provider. Make sure you discuss any questions you have with your health care provider. Document Released: 12/21/2010 Document Revised: 06/09/2015 Document Reviewed: 05/19/2014 Elsevier Interactive Patient Education  2018 Elsevier Inc.      Agustina Caroli, MD [facility]

## 2017-12-31 ENCOUNTER — Ambulatory Visit: Payer: BLUE CROSS/BLUE SHIELD | Admitting: Sports Medicine

## 2017-12-31 ENCOUNTER — Ambulatory Visit (INDEPENDENT_AMBULATORY_CARE_PROVIDER_SITE_OTHER): Payer: BLUE CROSS/BLUE SHIELD

## 2017-12-31 ENCOUNTER — Other Ambulatory Visit: Payer: Self-pay | Admitting: Sports Medicine

## 2017-12-31 ENCOUNTER — Encounter: Payer: Self-pay | Admitting: Sports Medicine

## 2017-12-31 VITALS — BP 143/92 | HR 84

## 2017-12-31 DIAGNOSIS — Z8781 Personal history of (healed) traumatic fracture: Secondary | ICD-10-CM | POA: Diagnosis not present

## 2017-12-31 DIAGNOSIS — M79672 Pain in left foot: Secondary | ICD-10-CM

## 2017-12-31 DIAGNOSIS — M79671 Pain in right foot: Secondary | ICD-10-CM

## 2017-12-31 DIAGNOSIS — M722 Plantar fascial fibromatosis: Secondary | ICD-10-CM | POA: Diagnosis not present

## 2017-12-31 DIAGNOSIS — M779 Enthesopathy, unspecified: Secondary | ICD-10-CM

## 2017-12-31 MED ORDER — METHYLPREDNISOLONE 4 MG PO TBPK: ORAL_TABLET | ORAL | 0 refills | Status: DC

## 2017-12-31 MED ORDER — TRAMADOL HCL 50 MG PO TABS: 50.0000 mg | ORAL_TABLET | Freq: Three times a day (TID) | ORAL | 0 refills | Status: DC | PRN

## 2017-12-31 MED ORDER — MELOXICAM 15 MG PO TABS: 15.0000 mg | ORAL_TABLET | Freq: Every day | ORAL | 0 refills | Status: DC

## 2017-12-31 NOTE — Progress Notes (Signed)
Subjective: Kara Hall is a 47 y.o. female patient presents to office with complaint of moderate heel pain on the right greater than left. Patient admits to post static dyskinesia for 2 to 3 weeks in duration. Patient has treated this problem with rest and changing shoes with no relief.  Reports that her left foot pain has been there pretty much 1 year after she fractured her metatarsal she started having pain along the lateral side of her foot and occasional pain to her left heel patient reports that she was being seen by orthopedic doctor and was discharged once the once the bone was healed however she still continued to have foot pain of which she is just been dealing with.  Patient states that she hurts first thing in the morning when she is getting ready to step out of bed and also throughout the day especially on the lateral side of her left foot.  Patient denies redness warmth bruising nausea vomiting fever chills or any other constitutional symptoms at this time.  Patient denies any other pedal complaints.  Review of Systems  Musculoskeletal: Positive for joint pain.  All other systems reviewed and are negative.     Patient Active Problem List   Diagnosis Date Noted  . Carpal tunnel syndrome on both sides 12/10/2017  . Achilles tendon contracture, left 10/04/2016  . Displaced fracture of fifth metatarsal bone of left foot with delayed healing 05/07/2016  . Adjustment disorder with mixed anxiety and depressed mood 11/28/2015  . Bilateral wrist pain 11/28/2015  . History of thyroid cancer 08/09/2013  . Anxiety 01/12/2013    Current Outpatient Medications on File Prior to Visit  Medication Sig Dispense Refill  . ALPRAZolam (XANAX) 0.5 MG tablet Take 0.5-1 tablets (0.25-0.5 mg total) by mouth daily as needed for anxiety. 30 tablet 0  . levothyroxine (SYNTHROID, LEVOTHROID) 125 MCG tablet Take 125 mcg by mouth daily before breakfast.     . fluticasone (FLONASE) 50 MCG/ACT nasal  spray Place 2 sprays into both nostrils daily. (Patient not taking: Reported on 12/31/2017) 16 g 12  . mupirocin ointment (BACTROBAN) 2 % Apply 1 application topically 3 (three) times daily. (Patient not taking: Reported on 12/31/2017) 30 g 1   No current facility-administered medications on file prior to visit.     Allergies  Allergen Reactions  . Sulfa Antibiotics Other (See Comments) and Nausea And Vomiting    Severe bladder, and kidney infection  . Trazodone And Nefazodone Other (See Comments)    Headache every am Other reaction(s): Other Headache every am    Objective: Physical Exam General: The patient is alert and oriented x3 in no acute distress.  Dermatology: Skin is warm, dry and supple bilateral lower extremities. Nails 1-10 are normal. There is no erythema, edema, no eccymosis, no open lesions present. Integument is otherwise unremarkable.  Vascular: Dorsalis Pedis pulse and Posterior Tibial pulse are 2/4 bilateral. Capillary fill time is immediate to all digits.  Neurological: Grossly intact to light touch with an achilles reflex of +2/5 and a  negative Tinel's sign bilateral.  Musculoskeletal: Tenderness to palpation at the medial calcaneal tubercale and through the insertion of the plantar fascia on the right greater than left foot.  There is also mild tenderness along the peroneal insertion at the fifth metatarsal base on left, no pain with compression of calcaneus bilateral. No pain with tuning fork to calcaneus bilateral. No pain with calf compression bilateral. There is decreased Ankle joint range of motion bilateral. All  other joints range of motion within normal limits bilateral. Strength 5/5 in all groups bilateral.   Gait: Unassisted, Antalgic avoid weight on heels  Xray, Right/Left foot:  Normal osseous mineralization.  Old fracture healed at the fifth metatarsal base on the left.  Joint spaces preserved. No new fracture/dislocation/boney destruction. Calcaneal  spur present with mild thickening of plantar fascia bilateral however there is a smaller almost minimal heel spur on the right as compared to the left foot. No other soft tissue abnormalities or radiopaque foreign bodies.   Assessment and Plan: Problem List Items Addressed This Visit    None    Visit Diagnoses    Plantar fasciitis, bilateral    -  Primary   Relevant Medications   traMADol (ULTRAM) 50 MG tablet   methylPREDNISolone (MEDROL DOSEPAK) 4 MG TBPK tablet   meloxicam (MOBIC) 15 MG tablet   Pain in left foot       Pain in right foot       History of foot fracture       Left fifth metatarsal base   Tendinitis          -Complete examination performed.  -Xrays reviewed -Discussed with patient in detail the condition of plantar fasciitis right greater than left with likely chronic tendinitis on left secondary to history of previous fracture, how this occurs and general treatment options. Explained both conservative and surgical treatments.  -Patient declined injection -Rx Meloxicam to start after Medrol dose pack is completed and tramadol to take for severe episodes of pain -Recommended good supportive shoes and dispensed heel lifts and advised patient if this works well in the future will definitely benefit from custom insoles -Explained and dispensed to patient daily stretching exercises. -Recommend patient to ice affected area 1-2x daily. -Patient to return to office in 3-4 weeks for follow up or sooner if problems or questions arise.  Advised patient if pain is not resolved then would benefit from a steroid injection.  Landis Martins, DPM

## 2018-01-21 ENCOUNTER — Ambulatory Visit: Payer: BLUE CROSS/BLUE SHIELD | Admitting: Sports Medicine

## 2018-01-21 ENCOUNTER — Encounter: Payer: Self-pay | Admitting: Sports Medicine

## 2018-01-21 DIAGNOSIS — M722 Plantar fascial fibromatosis: Secondary | ICD-10-CM

## 2018-01-21 DIAGNOSIS — M79672 Pain in left foot: Secondary | ICD-10-CM

## 2018-01-21 DIAGNOSIS — M79671 Pain in right foot: Secondary | ICD-10-CM

## 2018-01-21 MED ORDER — TRAMADOL HCL 50 MG PO TABS: 50.0000 mg | ORAL_TABLET | Freq: Three times a day (TID) | ORAL | 0 refills | Status: DC | PRN

## 2018-01-21 MED ORDER — TRIAMCINOLONE ACETONIDE 10 MG/ML IJ SUSP
10.0000 mg | Freq: Once | INTRAMUSCULAR | Status: AC
  Administered 2018-01-21: 10 mg

## 2018-01-21 NOTE — Progress Notes (Signed)
Subjective: Kara Hall is a 48 y.o. female patient returns office for follow-up evaluation of bilateral heel pain.  Patient still reports that her right heel hurts more than the left.  Patient reports that her steroid by mouth and her meloxicam medication seemed to help as well as taking her tramadol however did not feel much of anything with her heel pain and did not ice.  Patient admits that she tried gentle stretching but still has some headache on her right heel.  Patient denies redness warmth significant bruising or swelling or any other constitutional symptoms like nausea vomiting fever chills at this time.    Patient Active Problem List   Diagnosis Date Noted  . Carpal tunnel syndrome on both sides 12/10/2017  . Achilles tendon contracture, left 10/04/2016  . Displaced fracture of fifth metatarsal bone of left foot with delayed healing 05/07/2016  . Adjustment disorder with mixed anxiety and depressed mood 11/28/2015  . Bilateral wrist pain 11/28/2015  . History of thyroid cancer 08/09/2013  . Anxiety 01/12/2013    Current Outpatient Medications on File Prior to Visit  Medication Sig Dispense Refill  . ALPRAZolam (XANAX) 0.5 MG tablet Take 0.5-1 tablets (0.25-0.5 mg total) by mouth daily as needed for anxiety. 30 tablet 0  . fluticasone (FLONASE) 50 MCG/ACT nasal spray Place 2 sprays into both nostrils daily. 16 g 12  . levothyroxine (SYNTHROID, LEVOTHROID) 125 MCG tablet Take 125 mcg by mouth daily before breakfast.     . meloxicam (MOBIC) 15 MG tablet Take 1 tablet (15 mg total) by mouth daily. 30 tablet 0   No current facility-administered medications on file prior to visit.     Allergies  Allergen Reactions  . Sulfa Antibiotics Other (See Comments) and Nausea And Vomiting    Severe bladder, and kidney infection  . Trazodone And Nefazodone Other (See Comments)    Headache every am Other reaction(s): Other Headache every am    Objective: Physical Exam General:  The patient is alert and oriented x3 in no acute distress.  Dermatology: Skin is warm, dry and supple bilateral lower extremities. Nails 1-10 are normal. There is no erythema, edema, no eccymosis, no open lesions present. Integument is otherwise unremarkable.  Vascular: Dorsalis Pedis pulse and Posterior Tibial pulse are 2/4 bilateral. Capillary fill time is immediate to all digits.  Neurological: Grossly intact to light touch with an achilles reflex of +2/5 and a  negative Tinel's sign bilateral.  Musculoskeletal: Tenderness to palpation at the medial calcaneal tubercale and through the insertion of the plantar fascia on the right greater than left foot.  There is also mild tenderness along the peroneal insertion at the fifth metatarsal base on left, history of previous fracture of the 5th metatarsal, no pain with compression of calcaneus bilateral. No pain with tuning fork to calcaneus bilateral. No pain with calf compression bilateral. There is decreased Ankle joint range of motion bilateral. All other joints range of motion within normal limits bilateral. Strength 5/5 in all groups bilateral.   Assessment and Plan: Problem List Items Addressed This Visit    None    Visit Diagnoses    Pain in right foot    -  Primary   Plantar fasciitis, bilateral       Relevant Medications   traMADol (ULTRAM) 50 MG tablet   triamcinolone acetonide (KENALOG) 10 MG/ML injection 10 mg (Completed)   Pain in left foot          -Complete examination performed.  -Re-Discussed  with patient in detail the condition of plantar fasciitis right greater than left with likely chronic tendinitis on left secondary to history of previous fracture, how this occurs and general treatment options. Explained both conservative and surgical treatments.  -After oral consent and aseptic prep, injected a mixture containing 1 ml of 2%  plain lidocaine, 1 ml 0.5% plain marcaine, 0.5 ml of kenalog 10 and 0.5 ml of dexamethasone  phosphate into right heel at the glabrous junction for plantar fasciitis without complication. Post-injection care discussed with patient.  -Refilled tramadol to take for severe episodes of pain -Encourage patient to try icing as previously instructed and to continue with daily stretching -Advised patient from 1 week to try her cam boot that she has at home and then to transition to her new set of heel lifts with good supportive shoes thereafter -Patient to return to office in 3-4 weeks for follow up or sooner if problems or questions arise. Landis Martins, DPM

## 2018-01-23 ENCOUNTER — Ambulatory Visit (INDEPENDENT_AMBULATORY_CARE_PROVIDER_SITE_OTHER): Payer: Self-pay | Admitting: Orthopaedic Surgery

## 2018-01-23 ENCOUNTER — Telehealth: Payer: Self-pay | Admitting: Sports Medicine

## 2018-01-23 DIAGNOSIS — M722 Plantar fascial fibromatosis: Secondary | ICD-10-CM

## 2018-01-23 MED ORDER — MELOXICAM 15 MG PO TABS: 15.0000 mg | ORAL_TABLET | Freq: Every day | ORAL | 0 refills | Status: DC

## 2018-01-23 NOTE — Addendum Note (Signed)
Addended by: Harriett Sine D on: 01/23/2018 04:10 PM   Modules accepted: Orders

## 2018-01-23 NOTE — Telephone Encounter (Signed)
Can you please refill Meloxicam for patient.

## 2018-02-11 ENCOUNTER — Ambulatory Visit: Payer: BLUE CROSS/BLUE SHIELD | Admitting: Sports Medicine

## 2018-02-11 ENCOUNTER — Encounter: Payer: Self-pay | Admitting: Sports Medicine

## 2018-02-11 DIAGNOSIS — M722 Plantar fascial fibromatosis: Secondary | ICD-10-CM

## 2018-02-11 DIAGNOSIS — Z8781 Personal history of (healed) traumatic fracture: Secondary | ICD-10-CM | POA: Diagnosis not present

## 2018-02-11 DIAGNOSIS — M779 Enthesopathy, unspecified: Secondary | ICD-10-CM

## 2018-02-11 DIAGNOSIS — M79671 Pain in right foot: Secondary | ICD-10-CM

## 2018-02-11 DIAGNOSIS — M79672 Pain in left foot: Secondary | ICD-10-CM | POA: Diagnosis not present

## 2018-02-11 NOTE — Progress Notes (Signed)
Subjective: Kara Hall is a 48 y.o. female patient returns office for follow-up evaluation of bilateral heel pain after injection on her Right.  Patient still reports that pain is still present in right heel. States pain is 5/10 but her boot and tramadol has helped. Reports that she still has some pain in AM with 1st steps. Denies any new problems or any acute issues.   Patient Active Problem List   Diagnosis Date Noted  . Carpal tunnel syndrome on both sides 12/10/2017  . Achilles tendon contracture, left 10/04/2016  . Displaced fracture of fifth metatarsal bone of left foot with delayed healing 05/07/2016  . Adjustment disorder with mixed anxiety and depressed mood 11/28/2015  . Bilateral wrist pain 11/28/2015  . History of thyroid cancer 08/09/2013  . Anxiety 01/12/2013    Current Outpatient Medications on File Prior to Visit  Medication Sig Dispense Refill  . ALPRAZolam (XANAX) 0.5 MG tablet Take 0.5-1 tablets (0.25-0.5 mg total) by mouth daily as needed for anxiety. 30 tablet 0  . fluticasone (FLONASE) 50 MCG/ACT nasal spray Place 2 sprays into both nostrils daily. 16 g 12  . levothyroxine (SYNTHROID, LEVOTHROID) 125 MCG tablet Take 125 mcg by mouth daily before breakfast.     . meloxicam (MOBIC) 15 MG tablet Take 1 tablet (15 mg total) by mouth daily. 30 tablet 0  . traMADol (ULTRAM) 50 MG tablet Take 1 tablet (50 mg total) by mouth every 8 (eight) hours as needed. 30 tablet 0   No current facility-administered medications on file prior to visit.     Allergies  Allergen Reactions  . Sulfa Antibiotics Other (See Comments) and Nausea And Vomiting    Severe bladder, and kidney infection  . Trazodone And Nefazodone Other (See Comments)    Headache every am Other reaction(s): Other Headache every am    Objective: Physical Exam General: The patient is alert and oriented x3 in no acute distress.  Dermatology: Skin is warm, dry and supple bilateral lower extremities.  Nails 1-10 are normal. There is no erythema, edema, no eccymosis, no open lesions present. Integument is otherwise unremarkable.  Vascular: Dorsalis Pedis pulse and Posterior Tibial pulse are 2/4 bilateral. Capillary fill time is immediate to all digits.  Neurological: Grossly intact to light touch with an achilles reflex of +2/5 and a  negative Tinel's sign bilateral.  Musculoskeletal: Tenderness to palpation at the medial calcaneal tubercale and through the insertion of the plantar fascia on the right greater than left foot.  There is also mild tenderness along the peroneal insertion at the fifth metatarsal base on left, history of previous fracture of the 5th metatarsal, no pain with compression of calcaneus bilateral. No pain with tuning fork to calcaneus bilateral. No pain with calf compression bilateral. There is decreased Ankle joint range of motion bilateral. All other joints range of motion within normal limits bilateral. Strength 5/5 in all groups bilateral.   Assessment and Plan: Problem List Items Addressed This Visit    None    Visit Diagnoses    Plantar fasciitis, bilateral    -  Primary   Pain in right foot       Pain in left foot       History of foot fracture       Tendinitis          -Complete examination performed.  -Re-Discussed with patient in detail the condition of plantar fasciitis right greater than left with likely chronic tendinitis on left secondary to history  of previous fracture, how this occurs and general treatment options. Explained both conservative and surgical treatments.  -Continue with Mobic and Tramdol PRN  -Encouraged patient to try icing consistently as previously instructed and to continue with daily stretching -Dispensed Night splint -Patient wants to hold off on PT at this time  -Advised patient may transition out of CAM boot as tolerated to tennis shoes with heel lift  -Patient to return to office in 4 weeks for follow up or sooner if problems  or questions arise. Advised patient if pain continues to try PT vs. EPAT. Landis Martins, DPM

## 2018-03-11 ENCOUNTER — Ambulatory Visit: Payer: BLUE CROSS/BLUE SHIELD | Admitting: Sports Medicine

## 2018-03-24 DIAGNOSIS — N941 Unspecified dyspareunia: Secondary | ICD-10-CM | POA: Insufficient documentation

## 2018-03-24 DIAGNOSIS — Z124 Encounter for screening for malignant neoplasm of cervix: Secondary | ICD-10-CM | POA: Diagnosis not present

## 2018-03-24 DIAGNOSIS — R638 Other symptoms and signs concerning food and fluid intake: Secondary | ICD-10-CM | POA: Insufficient documentation

## 2018-03-24 DIAGNOSIS — Z01419 Encounter for gynecological examination (general) (routine) without abnormal findings: Secondary | ICD-10-CM | POA: Diagnosis not present

## 2018-03-24 DIAGNOSIS — Z6827 Body mass index (BMI) 27.0-27.9, adult: Secondary | ICD-10-CM | POA: Diagnosis not present

## 2018-03-25 ENCOUNTER — Ambulatory Visit: Payer: BLUE CROSS/BLUE SHIELD | Admitting: Sports Medicine

## 2018-03-25 ENCOUNTER — Encounter: Payer: Self-pay | Admitting: Sports Medicine

## 2018-03-25 DIAGNOSIS — M722 Plantar fascial fibromatosis: Secondary | ICD-10-CM

## 2018-03-25 DIAGNOSIS — M79671 Pain in right foot: Secondary | ICD-10-CM

## 2018-03-25 MED ORDER — MELOXICAM 15 MG PO TABS: 15.0000 mg | ORAL_TABLET | Freq: Every day | ORAL | 0 refills | Status: DC

## 2018-03-25 MED ORDER — TRIAMCINOLONE ACETONIDE 10 MG/ML IJ SUSP
10.0000 mg | Freq: Once | INTRAMUSCULAR | Status: AC
  Administered 2018-03-25: 10 mg

## 2018-03-25 MED ORDER — TRAMADOL HCL 50 MG PO TABS: 50.0000 mg | ORAL_TABLET | Freq: Three times a day (TID) | ORAL | 0 refills | Status: DC | PRN

## 2018-03-25 NOTE — Progress Notes (Signed)
Subjective: Kara Hall is a 48 y.o. female patient returns office for follow-up evaluation of bilateral heel pain. Reports no pain on the left but still has pain on right. Better but not completely resolved. Medication and Night splint helps. Pain 4-5/10 on right. Denies any new problems or any acute issues.   Patient Active Problem List   Diagnosis Date Noted  . Dyspareunia in female 03/24/2018  . Increased body mass index 03/24/2018  . Carpal tunnel syndrome on both sides 12/10/2017  . Achilles tendon contracture, left 10/04/2016  . Displaced fracture of fifth metatarsal bone of left foot with delayed healing 05/07/2016  . Adjustment disorder with mixed anxiety and depressed mood 11/28/2015  . Bilateral wrist pain 11/28/2015  . History of thyroid cancer 08/09/2013  . Anxiety 01/12/2013    Current Outpatient Medications on File Prior to Visit  Medication Sig Dispense Refill  . ALPRAZolam (XANAX) 0.5 MG tablet Take 0.5-1 tablets (0.25-0.5 mg total) by mouth daily as needed for anxiety. 30 tablet 0  . fluticasone (FLONASE) 50 MCG/ACT nasal spray Place 2 sprays into both nostrils daily. 16 g 12  . levothyroxine (SYNTHROID, LEVOTHROID) 125 MCG tablet Take 125 mcg by mouth daily before breakfast.      No current facility-administered medications on file prior to visit.     Allergies  Allergen Reactions  . Sulfa Antibiotics Other (See Comments) and Nausea And Vomiting    Severe bladder, and kidney infection  . Trazodone And Nefazodone Other (See Comments)    Headache every am Other reaction(s): Other Headache every am    Objective: Physical Exam General: The patient is alert and oriented x3 in no acute distress.  Dermatology: Skin is warm, dry and supple bilateral lower extremities. Nails 1-10 are normal. There is no erythema, edema, no eccymosis, no open lesions present. Integument is otherwise unremarkable.  Vascular: Dorsalis Pedis pulse and Posterior Tibial pulse  are 2/4 bilateral. Capillary fill time is immediate to all digits.  Neurological: Grossly intact to light touch with an achilles reflex of +2/5 and a  negative Tinel's sign bilateral.  Musculoskeletal: Tenderness to palpation at the medial calcaneal tubercale and through the insertion of the plantar fascia on the right. No pain to left. There is no pain with compression of calcaneus bilateral. No pain with tuning fork to calcaneus bilateral. No pain with calf compression bilateral. There is decreased Ankle joint range of motion bilateral. All other joints range of motion within normal limits bilateral. Strength 5/5 in all groups bilateral.   Assessment and Plan: Problem List Items Addressed This Visit    None    Visit Diagnoses    Plantar fasciitis of right foot    -  Primary   Relevant Medications   triamcinolone acetonide (KENALOG) 10 MG/ML injection 10 mg (Completed)   Inflammatory pain of right heel       Relevant Medications   triamcinolone acetonide (KENALOG) 10 MG/ML injection 10 mg (Completed)   Plantar fasciitis, bilateral       Relevant Medications   traMADol (ULTRAM) 50 MG tablet   meloxicam (MOBIC) 15 MG tablet      -Complete examination performed.  -Re-Discussed with patient in detail the condition of plantar fasciitis right -After oral consent and aseptic prep, injected a mixture containing 1 ml of 2%  plain lidocaine, 1 ml 0.5% plain marcaine, 0.5 ml of kenalog 10 and 0.5 ml of dexamethasone phosphate into R heel at plantar fascial insertion at glabrous junction without complication. Post-injection  care discussed with patient.   -Continue with Mobic and Tramdol PRN; Refill provided  -Continue with Night splint -Patient wants to hold off on PT at this time again due to mother in hospital  -Continue with tennis shoe with heel lift  -Patient to return to office as needed or sooner if problems or issues arise.  Landis Martins, DPM

## 2018-04-03 DIAGNOSIS — E890 Postprocedural hypothyroidism: Secondary | ICD-10-CM | POA: Diagnosis not present

## 2018-04-03 DIAGNOSIS — E663 Overweight: Secondary | ICD-10-CM | POA: Diagnosis not present

## 2018-04-03 DIAGNOSIS — Z85850 Personal history of malignant neoplasm of thyroid: Secondary | ICD-10-CM | POA: Diagnosis not present

## 2018-04-30 ENCOUNTER — Other Ambulatory Visit: Payer: Self-pay | Admitting: Physician Assistant

## 2018-04-30 ENCOUNTER — Telehealth: Payer: Self-pay | Admitting: General Practice

## 2018-04-30 DIAGNOSIS — F419 Anxiety disorder, unspecified: Secondary | ICD-10-CM

## 2018-04-30 DIAGNOSIS — F4323 Adjustment disorder with mixed anxiety and depressed mood: Secondary | ICD-10-CM

## 2018-04-30 NOTE — Telephone Encounter (Signed)
Requested medication (s) are due for refill today: yes  Requested medication (s) are on the active medication list: yes  Last refill:  06/03/17  Future visit scheduled: no  Notes to clinic: not delegated    Requested Prescriptions  Pending Prescriptions Disp Refills   ALPRAZolam (XANAX) 0.5 MG tablet 30 tablet 0    Sig: Take 0.5-1 tablets (0.25-0.5 mg total) by mouth daily as needed for anxiety.     Not Delegated - Psychiatry:  Anxiolytics/Hypnotics Failed - 04/30/2018  8:40 AM      Failed - This refill cannot be delegated      Failed - Urine Drug Screen completed in last 360 days.      Failed - Valid encounter within last 6 months    Recent Outpatient Visits          4 months ago Bilateral wrist pain   [facility] at [facility], [place], [place]   11 months ago Bilateral wrist pain   Primary Care at [facility], [place], [place]   1 year ago Rash   Primary Care at [place], [place], [place]   1 year ago Rash   Primary Care at [place], [place], [place]   1 year ago Dysuria   Primary Care at [place], [place], [place]

## 2018-05-04 NOTE — Telephone Encounter (Signed)
error 

## 2018-05-15 ENCOUNTER — Other Ambulatory Visit: Payer: Self-pay | Admitting: Sports Medicine

## 2018-05-15 DIAGNOSIS — M722 Plantar fascial fibromatosis: Secondary | ICD-10-CM

## 2018-05-19 ENCOUNTER — Telehealth: Payer: Self-pay | Admitting: Sports Medicine

## 2018-05-19 DIAGNOSIS — M722 Plantar fascial fibromatosis: Secondary | ICD-10-CM

## 2018-05-19 MED ORDER — TRAMADOL HCL 50 MG PO TABS: 50.0000 mg | ORAL_TABLET | Freq: Three times a day (TID) | ORAL | 0 refills | Status: DC | PRN

## 2018-05-19 NOTE — Addendum Note (Signed)
Addended by: Harriett Sine D on: 05/19/2018 12:03 PM   Modules accepted: Orders

## 2018-05-19 NOTE — Telephone Encounter (Signed)
Pt states she was unable to get to the appt line and left a message, but wanted to make sure she was able to schedule and the tramadol was called to [facility].. Tramadol has been called to the requested [facility] and pt is schedule for 05/27/2018.

## 2018-05-19 NOTE — Telephone Encounter (Signed)
Pt. Would like a refill of Tramadol

## 2018-05-19 NOTE — Telephone Encounter (Signed)
Left message refilling tramadol with [id number].

## 2018-05-19 NOTE — Telephone Encounter (Signed)
I called pt and informed that Dr. Marcene Duos the Tramadol refill and requested she make an appt to be seen. Pt agreed and I transferred to schedulers.

## 2018-05-26 ENCOUNTER — Other Ambulatory Visit: Payer: Self-pay

## 2018-05-26 ENCOUNTER — Telehealth (INDEPENDENT_AMBULATORY_CARE_PROVIDER_SITE_OTHER): Payer: BLUE CROSS/BLUE SHIELD | Admitting: Family Medicine

## 2018-05-26 DIAGNOSIS — F4323 Adjustment disorder with mixed anxiety and depressed mood: Secondary | ICD-10-CM

## 2018-05-26 MED ORDER — ALPRAZOLAM 0.5 MG PO TABS: 0.2500 mg | ORAL_TABLET | Freq: Every day | ORAL | 0 refills | Status: DC | PRN

## 2018-05-26 MED ORDER — SERTRALINE HCL 50 MG PO TABS: 50.0000 mg | ORAL_TABLET | Freq: Every day | ORAL | 3 refills | Status: DC
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
## 2018-05-27 ENCOUNTER — Encounter: Payer: Self-pay | Admitting: Sports Medicine

## 2018-05-27 ENCOUNTER — Ambulatory Visit: Payer: BLUE CROSS/BLUE SHIELD | Admitting: Sports Medicine

## 2018-05-27 ENCOUNTER — Other Ambulatory Visit: Payer: Self-pay

## 2018-05-27 VITALS — Temp 97.5°F

## 2018-05-27 DIAGNOSIS — M79672 Pain in left foot: Secondary | ICD-10-CM

## 2018-05-27 DIAGNOSIS — M722 Plantar fascial fibromatosis: Secondary | ICD-10-CM

## 2018-05-27 DIAGNOSIS — Z8781 Personal history of (healed) traumatic fracture: Secondary | ICD-10-CM

## 2018-05-27 DIAGNOSIS — M79671 Pain in right foot: Secondary | ICD-10-CM

## 2018-05-27 NOTE — Progress Notes (Signed)
Subjective: Kara Hall is a 48 y.o. female patient returns office for follow-up evaluation of bilateral heel pain. Reports no pain on the left beside occassional achy pain and  still has pain on right at her arch and heel. Better but not completely resolved. Medication and Night splint helps like previous but pain 4-5/10 on right. Denies any new problems or any acute issues. No other pedal complaints.   Patient Active Problem List   Diagnosis Date Noted  . Dyspareunia in female 03/24/2018  . Increased body mass index 03/24/2018  . Carpal tunnel syndrome on both sides 12/10/2017  . Achilles tendon contracture, left 10/04/2016  . Displaced fracture of fifth metatarsal bone of left foot with delayed healing 05/07/2016  . Adjustment disorder with mixed anxiety and depressed mood 11/28/2015  . Bilateral wrist pain 11/28/2015  . History of thyroid cancer 08/09/2013  . Anxiety 01/12/2013    Current Outpatient Medications on File Prior to Visit  Medication Sig Dispense Refill  . ALPRAZolam (XANAX) 0.5 MG tablet Take 0.5-1 tablets (0.25-0.5 mg total) by mouth daily as needed for anxiety. 30 tablet 0  . fluticasone (FLONASE) 50 MCG/ACT nasal spray Place 2 sprays into both nostrils daily. 16 g 12  . levothyroxine (SYNTHROID, LEVOTHROID) 125 MCG tablet Take 125 mcg by mouth daily before breakfast.     . meloxicam (MOBIC) 15 MG tablet Take 1 tablet (15 mg total) by mouth daily. 30 tablet 0  . sertraline (ZOLOFT) 50 MG tablet Take 1 tablet (50 mg total) by mouth daily. 30 tablet 3  . traMADol (ULTRAM) 50 MG tablet Take 1 tablet (50 mg total) by mouth every 8 (eight) hours as needed. 30 tablet 0   No current facility-administered medications on file prior to visit.     Allergies  Allergen Reactions  . Sulfa Antibiotics Other (See Comments) and Nausea And Vomiting    Severe bladder, and kidney infection  . Trazodone And Nefazodone Other (See Comments)    Headache every am Other  reaction(s): Other Headache every am    Objective: Physical Exam General: The patient is alert and oriented x3 in no acute distress.  Dermatology: Skin is warm, dry and supple bilateral lower extremities. Nails 1-10 are normal. There is no erythema, edema, no eccymosis, no open lesions present. Integument is otherwise unremarkable.  Vascular: Dorsalis Pedis pulse and Posterior Tibial pulse are 2/4 bilateral. Capillary fill time is immediate to all digits.  Neurological: Grossly intact to light touch with an achilles reflex of +2/5 and a negative Tinel's sign bilateral.  Musculoskeletal: Tenderness to palpation at the medial calcaneal tubercale and through the insertion of the plantar fascia on the right. No pain to left nut subjective pain at 5th met base at area of previous fracture. There is no pain with compression of calcaneus bilateral. No pain with tuning fork to calcaneus bilateral. No pain with calf compression bilateral. There is decreased Ankle joint range of motion bilateral. All other joints range of motion within normal limits bilateral. Strength 5/5 in all groups bilateral.   Assessment and Plan: Problem List Items Addressed This Visit    None    Visit Diagnoses    Plantar fasciitis of right foot    -  Primary   Pain in right foot       History of foot fracture       Left   Pain in left foot       Possible traumatic arthrits from hx of fracture 5th met  base      -Complete examination performed.  -Re-Discussed with patient in detail the condition of plantar fasciitis right -Patient declined another injection on right heel today -Applied plantar fascial strapping on right and advised patient to keep intact 5 days  -Continue with Mobic and Tramdol PRN -Continue with Night splint  -Continue with tennis shoe with heel lift -Office to contact patient re: Orthotic benefits  -Patient to return to office casting for orthotics or sooner if problems or issues arise.  Landis Martins, DPM

## 2018-05-27 NOTE — Patient Instructions (Signed)

## 2018-06-11 ENCOUNTER — Telehealth: Payer: Self-pay | Admitting: Sports Medicine

## 2018-06-11 NOTE — Telephone Encounter (Signed)
Pt called back and left message she is wanting to proceed with the orthotics and wants to be scheduled for 7.14.20..  I returned call and scheduled her to see Liliane Channel on 7.14.2020. I explained that we are still going to [employer] and what ever they don't cover she will get a bill from [facility] but the most she would incur for the orthotics would be 398.00 and that once she gets a bill she can make payments if needed.

## 2018-06-26 ENCOUNTER — Encounter: Payer: Self-pay | Admitting: Family Medicine

## 2018-06-26 ENCOUNTER — Other Ambulatory Visit: Payer: Self-pay

## 2018-06-26 ENCOUNTER — Ambulatory Visit: Payer: BC Managed Care – PPO | Admitting: Family Medicine

## 2018-06-26 VITALS — BP 135/90 | HR 82 | Temp 98.7°F | Ht 65.5 in | Wt 179.2 lb

## 2018-06-26 DIAGNOSIS — F4323 Adjustment disorder with mixed anxiety and depressed mood: Secondary | ICD-10-CM | POA: Diagnosis not present

## 2018-06-26 DIAGNOSIS — R638 Other symptoms and signs concerning food and fluid intake: Secondary | ICD-10-CM | POA: Diagnosis not present

## 2018-06-26 DIAGNOSIS — Z85850 Personal history of malignant neoplasm of thyroid: Secondary | ICD-10-CM

## 2018-06-26 MED ORDER — SERTRALINE HCL 25 MG PO TABS: 25.0000 mg | ORAL_TABLET | Freq: Every day | ORAL | 0 refills | Status: DC

## 2018-06-26 MED ORDER — SERTRALINE HCL 25 MG PO TABS: 50.0000 mg | ORAL_TABLET | Freq: Every day | ORAL | 0 refills | Status: DC

## 2018-06-26 MED ORDER — ALPRAZOLAM 0.5 MG PO TABS: 0.2500 mg | ORAL_TABLET | Freq: Every day | ORAL | 0 refills | Status: DC | PRN

## 2018-06-26 NOTE — Progress Notes (Signed)
6/11/202011:06 AM  Kara Hall [birth year], 48 y.o., female [id number]  Chief Complaint  Patient presents with  . Follow-up    Hypothyroid, the xanax is too strong. She is only taking half a pill instead of a whole  . Medication Refill    xanax, zoloft, tramadol    HPI:   Patient is a 48 y.o. female with past medical history significant for hypothryodism s/p thyroid cancer, GAD, chronic pain, RLE DVT, migraines who presents today for routine followup  Last OV May 2020 Started sertraline, refilled xanax Has been doing 25mg  sertraline, did not tolerate 50mg , was too sedating She is doing better now on low dose Continues to struggle with work  Has gained weight since thyroidectomy - really frustrated Not calorie counting, thinks she eats healthy, finally pain better that should be able to start exercising again Seeing podiatrist for chronic left ankle pain s/p fx and right plantar fascitis, rx tramadol  Saw endo March 2020  Fall Risk  06/26/2018 05/26/2018 12/10/2017 06/03/2017 06/03/2017  Falls in the past year? 0 0 0 No No  Number falls in past yr: 0 0 - - -  Comment - - - - -  Injury with Fall? 0 1 - - -  Comment - - - - -  Follow up - - - - -     Depression screen [facility] 2/9 06/26/2018 05/26/2018 12/10/2017  Decreased Interest 2 3 0  Down, Depressed, Hopeless 1 - 0  PHQ - 2 Score 3 3 0  Altered sleeping 3 3 -  Tired, decreased energy 2 3 -  Change in appetite 3 3 -  Feeling bad or failure about yourself  0 3 -  Trouble concentrating 0 3 -  Moving slowly or fidgety/restless 0 3 -  Suicidal thoughts 0 3 -  PHQ-9 Score 11 24 -  Difficult doing work/chores - - -   GAD 7 : Generalized Anxiety Score 06/26/2018  Nervous, Anxious, on Edge 1  Control/stop worrying 1  Worry too much - different things 1  Trouble relaxing 1  Restless 0  Easily annoyed or irritable 1  Afraid - awful might happen 0  Total GAD 7 Score 5     Allergies  Allergen Reactions  . Sulfa  Antibiotics Other (See Comments) and Nausea And Vomiting    Severe bladder, and kidney infection  . Trazodone And Nefazodone Other (See Comments)    Headache every am Other reaction(s): Other Headache every am    Prior to Admission medications   Medication Sig Start Date End Date Taking? Authorizing Provider  fluticasone (FLONASE) 50 MCG/ACT nasal spray Place 2 sprays into both nostrils daily. 12/19/16  Yes English, Colletta Maryland D, PA  levothyroxine (SYNTHROID, LEVOTHROID) 125 MCG tablet Take 125 mcg by mouth daily before breakfast.    Yes [provider]  meloxicam (MOBIC) 15 MG tablet Take 1 tablet (15 mg total) by mouth daily. 03/25/18  Yes Stover, Titorya, DPM  sertraline (ZOLOFT) 50 MG tablet Take 1 tablet (50 mg total) by mouth daily. 05/26/18  Yes Rutherford Guys, MD  traMADol (ULTRAM) 50 MG tablet Take 1 tablet (50 mg total) by mouth every 8 (eight) hours as needed. 05/19/18  Yes Stover, Titorya, DPM  ALPRAZolam (XANAX) 0.5 MG tablet Take 0.5-1 tablets (0.25-0.5 mg total) by mouth daily as needed for anxiety. Patient not taking: Reported on 06/26/2018 05/26/18   Rutherford Guys, MD    Past Medical History:  Diagnosis Date  . Anemia   .  Anxiety    panic attack, also reports problems with depression, relative to her job & upcoming surgery , relative to life problems - with children & spouse    . Back complaints 2013   has had steroid injection   . Depression   . DVT (deep venous thrombosis) ([facility]) 2001   RLE  . Foot fracture, left   . Migraine    h/o migraines - as a teenager  & again now with family issues, states she has to lay down & rest in a dark room  (08/28/2013)  . Muscle strain of hand    wearing home made splint today, L hand  . Peripheral vascular disease (HCC)    DVT- per pt. in 2001- had vein removed.   . Refusal of blood transfusions as patient is Jehovah's Witness   . Thyroid cancer ([place])   . Thyroid nodule     Past Surgical History:  Procedure Laterality  Date  . THROMBECTOMY Right 2001   leg; after 3rd child was born; for deep vein thrombosis  . THYROIDECTOMY Right 08/28/2013   Procedure: RIGHT THYROID LOBECTOMY POSSIBLE TOTAL THYROIDECTOMY;  Surgeon: Jodi Marble, MD;  Location: [facility];  Service: ENT;  Laterality: Right;  . THYROIDECTOMY Left 09/10/2013   Procedure: COMPLETE LEFT THYROIDECTOMY;  Surgeon: Jodi Marble, MD;  Location: [facility];  Service: ENT;  Laterality: Left;  . TUBAL LIGATION  2001    Social History   Tobacco Use  . Smoking status: Never Smoker  . Smokeless tobacco: Never Used  Substance Use Topics  . Alcohol use: Yes    Comment: Cocktail or glass of wine occasionally    Family History  Problem Relation Age of Onset  . Heart disease Mother   . Hyperlipidemia Mother   . Heart disease Father   . Hyperlipidemia Father   . Diabetes Father   . Heart disease Brother   . Hyperlipidemia Brother   . Hypertension Brother   . Heart disease Brother   . Hyperlipidemia Brother     ROS Per hpi  OBJECTIVE:  Today's Vitals   06/26/18 1040  BP: 135/90  Pulse: 82  Temp: 98.7 F (37.1 C)  TempSrc: Oral  SpO2: 98%  Weight: 179 lb 3.2 oz (81.3 kg)  Height: 5' 5.5" (1.664 m)   Body mass index is 29.37 kg/m.   Physical Exam Vitals signs and nursing note reviewed.  Constitutional:      Appearance: She is well-developed.  HENT:     Head: Normocephalic and atraumatic.  Eyes:     General: No scleral icterus.    Conjunctiva/sclera: Conjunctivae normal.     Pupils: Pupils are equal, round, and reactive to light.  Neck:     Musculoskeletal: Neck supple.  Pulmonary:     Effort: Pulmonary effort is normal.  Skin:    General: Skin is warm and dry.  Neurological:     Mental Status: She is alert and oriented to person, place, and time.  Psychiatric:        Mood and Affect: Affect is labile and tearful.     ASSESSMENT and PLAN  1. Adjustment disorder with mixed anxiety and depressed mood Improved. Pmp  reviewed. Continue current medications. Declines counseling at this time. - ALPRAZolam (XANAX) 0.5 MG tablet; Take 0.5-1 tablets (0.25-0.5 mg total) by mouth daily as needed for anxiety.  2. Increased body mass index Discussed LFM, calorie counting, high fiber and protein intake, exercise.   3. History of thyroid cancer Managed  by endo  Other orders - sertraline (ZOLOFT) 25 MG tablet; Take 1 tablet (25 mg total) by mouth daily.  Return in about 3 months (around 09/26/2018).    Rutherford Guys, MD Primary Care at [facility] [place], [place] [address] Ph.  [phone] Fax [phone]

## 2018-06-26 NOTE — Patient Instructions (Addendum)
If you have lab work done today you will be contacted with your lab results within the next 2 weeks.  If you have not heard from [place] then please contact us. The fastest way to get your results is to register for My Chart.   IF you received an x-ray today, you will receive an invoice from [facility]. Please contact [facility] at [phone] with questions or concerns regarding your invoice.   IF you received labwork today, you will receive an invoice from [facility]. Please contact [facility] at [phone] with questions or concerns regarding your invoice.   Our billing staff will not be able to assist you with questions regarding bills from these companies.  You will be contacted with the lab results as soon as they are available. The fastest way to get your results is to activate your My Chart account. Instructions are located on the last page of this paperwork. If you have not heard from [place] regarding the results in 2 weeks, please contact this office.     Protein Content in Foods  Generally, most healthy people need around 50 grams of protein each day. Depending on your overall health, you may need more or less protein in your diet. Talk to your health care provider or dietitian about how much protein you need. See the following list for the protein content of some common foods. High-protein foods High-protein foods contain 4 grams (4 g) or more of protein per serving. They include:  Beef, ground sirloin (cooked) - 3 oz have 24 g of protein.  Cheese (hard) - 1 oz has 7 g of protein.  Chicken breast, boneless and skinless (cooked) - 3 oz have 13.4 g of protein.  Cottage cheese - 1/2 cup has 13.4 g of protein.  Egg - 1 egg has 6 g of protein.  Fish, filet (cooked) - 1 oz has 6-7 g of protein.  Garbanzo beans (canned or cooked) - 1/2 cup has 6-7 g of protein.  Kidney beans (canned or cooked) - 1/2 cup has 6-7 g of protein.  Lamb (cooked) - 3 oz has 24 g of  protein.  Milk - 1 cup (8 oz) has 8 g of protein.  Nuts (peanuts, pistachios, almonds) - 1 oz has 6 g of protein.  Peanut butter - 1 oz has 7-8 g of protein.  Pork tenderloin (cooked) - 3 oz has 18.4 g of protein.  Pumpkin seeds - 1 oz has 8.5 g of protein.  Soybeans (roasted) - 1 oz has 8 g of protein.  Soybeans (cooked) - 1/2 cup has 11 g of protein.  Soy milk - 1 cup (8 oz) has 5-10 g of protein.  Soy or vegetable patty - 1 patty has 11 g of protein.  Sunflower seeds - 1 oz has 5.5 g of protein.  Tofu (firm) - 1/2 cup has 20 g of protein.  Tuna (canned in water) - 3 oz has 20 g of protein.  Yogurt - 6 oz has 8 g of protein. Low-protein foods Low-protein foods contain 3 grams (3 g) or less of protein per serving. They include:  Beets (raw or cooked) - 1/2 cup has 1.5 g of protein.  Bran cereal - 1/2 cup has 2-3 g of protein.  Bread - 1 slice has 2.5 g of protein.  Broccoli (raw or cooked) - 1/2 cup has 2 g of protein.  Collard greens (raw or cooked) - 1/2 cup has 2 g of protein.  Corn (fresh or cooked) - 1/2 cup has 2 g of protein.  Cream cheese - 1 oz has 2 g of protein.  Creamer (half-and-half) - 1 oz has 1 g of protein.  Flour tortilla - 1 tortilla has 2.5 g of protein  Frozen yogurt - 1/2 cup has 3 g of protein.  Fruit or vegetable juice - 1/2 cup has 1 g of protein.  Green beans (raw or cooked) - 1/2 cup has 1 g of protein.  Green peas (canned) - 1/2 cup has 3.5 g of protein.  Muffins - 1 small muffin (2 oz) has 3 g of protein.  Oatmeal (cooked) - 1/2 cup has 3 g of protein.  Potato (baked with skin) - 1 medium potato has 3 g of protein.  Rice (cooked) - 1/2 cup has 2.5-3.5 g of protein.  Sour cream - 1/2 cup has 2.5 g of protein.  Spinach (cooked) - 1/2 cup has 3 g of protein.  Squash (cooked) - 1/2 cup has 1.5 g of protein. Actual amounts of protein may be different depending on processing. Talk with your health care provider or dietitian  about what foods are recommended for you. This information is not intended to replace advice given to you by your health care provider. Make sure you discuss any questions you have with your health care provider. Document Released: 04/02/2015 Document Revised: 09/12/2015 Document Reviewed: 09/12/2015 Elsevier Interactive Patient Education  2019 [employer] for [employer] Calories are units of energy. Your body needs a certain amount of calories from food to keep you going throughout the day. When you eat more calories than your body needs, your body stores the extra calories as fat. When you eat fewer calories than your body needs, your body burns fat to get the energy it needs. Calorie counting means keeping track of how many calories you eat and drink each day. Calorie counting can be helpful if you need to lose weight. If you make sure to eat fewer calories than your body needs, you should lose weight. Ask your health care provider what a healthy weight is for you. For calorie counting to work, you will need to eat the right number of calories in a day in order to lose a healthy amount of weight per week. A dietitian can help you determine how many calories you need in a day and will give you suggestions on how to reach your calorie goal.  A healthy amount of weight to lose per week is usually 1-2 lb (0.5-0.9 kg). This usually means that your daily calorie intake should be reduced by 500-750 calories.  Eating 1,200 - 1,500 calories per day can help most women lose weight.  Eating 1,500 - 1,800 calories per day can help most men lose weight. What is my plan? My goal is to have ____1200______ calories per day. If I have this many calories per day, I should lose around _____1_____ pounds per week. What do I need to know about calorie counting? In order to meet your daily calorie goal, you will need to:  Find out how many calories are in each food you would like to eat. Try  to do this before you eat.  Decide how much of the food you plan to eat.  Write down what you ate and how many calories it had. Doing this is called keeping a food log. To successfully lose weight, it is important to balance calorie counting with a healthy lifestyle that  includes regular activity. Aim for 150 minutes of moderate exercise (such as walking) or 75 minutes of vigorous exercise (such as running) each week. Where do I find calorie information?  The number of calories in a food can be found on a Nutrition Facts label. If a food does not have a Nutrition Facts label, try to look up the calories online or ask your dietitian for help. Remember that calories are listed per serving. If you choose to have more than one serving of a food, you will have to multiply the calories per serving by the amount of servings you plan to eat. For example, the label on a package of bread might say that a serving size is 1 slice and that there are 90 calories in a serving. If you eat 1 slice, you will have eaten 90 calories. If you eat 2 slices, you will have eaten 180 calories. How do I keep a food log? Immediately after each meal, record the following information in your food log:  What you ate. Don't forget to include toppings, sauces, and other extras on the food.  How much you ate. This can be measured in cups, ounces, or number of items.  How many calories each food and drink had.  The total number of calories in the meal. Keep your food log near you, such as in a small notebook in your pocket, or use a mobile app or website. Some programs will calculate calories for you and show you how many calories you have left for the day to meet your goal. What are some calorie counting tips?   Use your calories on foods and drinks that will fill you up and not leave you hungry: ? Some examples of foods that fill you up are nuts and nut butters, vegetables, lean proteins, and high-fiber foods like whole  grains. High-fiber foods are foods with more than 5 g fiber per serving. ? Drinks such as sodas, specialty coffee drinks, alcohol, and juices have a lot of calories, yet do not fill you up.  Eat nutritious foods and avoid empty calories. Empty calories are calories you get from foods or beverages that do not have many vitamins or protein, such as candy, sweets, and soda. It is better to have a nutritious high-calorie food (such as an avocado) than a food with few nutrients (such as a bag of chips).  Know how many calories are in the foods you eat most often. This will help you calculate calorie counts faster.  Pay attention to calories in drinks. Low-calorie drinks include water and unsweetened drinks.  Pay attention to nutrition labels for "low fat" or "fat free" foods. These foods sometimes have the same amount of calories or more calories than the full fat versions. They also often have added sugar, starch, or salt, to make up for flavor that was removed with the fat.  Find a way of tracking calories that works for you. Get creative. Try different apps or programs if writing down calories does not work for you. What are some portion control tips?  Know how many calories are in a serving. This will help you know how many servings of a certain food you can have.  Use a measuring cup to measure serving sizes. You could also try weighing out portions on a kitchen scale. With time, you will be able to estimate serving sizes for some foods.  Take some time to put servings of different foods on your favorite plates, bowls, and  cups so you know what a serving looks like.  Try not to eat straight from a bag or box. Doing this can lead to overeating. Put the amount you would like to eat in a cup or on a plate to make sure you are eating the right portion.  Use smaller plates, glasses, and bowls to prevent overeating.  Try not to multitask (for example, watch TV or use your computer) while eating. If  it is time to eat, sit down at a table and enjoy your food. This will help you to know when you are full. It will also help you to be aware of what you are eating and how much you are eating. What are tips for following this plan? Reading food labels  Check the calorie count compared to the serving size. The serving size may be smaller than what you are used to eating.  Check the source of the calories. Make sure the food you are eating is high in vitamins and protein and low in saturated and trans fats. Shopping  Read nutrition labels while you shop. This will help you make healthy decisions before you decide to purchase your food.  Make a grocery list and stick to it. Cooking  Try to cook your favorite foods in a healthier way. For example, try baking instead of frying.  Use low-fat dairy products. Meal planning  Use more fruits and vegetables. Half of your plate should be fruits and vegetables.  Include lean proteins like poultry and fish. How do I count calories when eating out?  Ask for smaller portion sizes.  Consider sharing an entree and sides instead of getting your own entree.  If you get your own entree, eat only half. Ask for a box at the beginning of your meal and put the rest of your entree in it so you are not tempted to eat it.  If calories are listed on the menu, choose the lower calorie options.  Choose dishes that include vegetables, fruits, whole grains, low-fat dairy products, and lean protein.  Choose items that are boiled, broiled, grilled, or steamed. Stay away from items that are buttered, battered, fried, or served with cream sauce. Items labeled "crispy" are usually fried, unless stated otherwise.  Choose water, low-fat milk, unsweetened iced tea, or other drinks without added sugar. If you want an alcoholic beverage, choose a lower calorie option such as a glass of wine or light beer.  Ask for dressings, sauces, and syrups on the side. These are  usually high in calories, so you should limit the amount you eat.  If you want a salad, choose a garden salad and ask for grilled meats. Avoid extra toppings like bacon, cheese, or fried items. Ask for the dressing on the side, or ask for olive oil and vinegar or lemon to use as dressing.  Estimate how many servings of a food you are given. For example, a serving of cooked rice is  cup or about the size of half a baseball. Knowing serving sizes will help you be aware of how much food you are eating at restaurants. The list below tells you how big or small some common portion sizes are based on everyday objects: ? 1 oz-4 stacked dice. ? 3 oz-1 deck of cards. ? 1 tsp-1 die. ? 1 Tbsp- a ping-pong ball. ? 2 Tbsp-1 ping-pong ball. ?  cup- baseball. ? 1 cup-1 baseball. Summary  Calorie counting means keeping track of how many calories you eat and  drink each day. If you eat fewer calories than your body needs, you should lose weight.  A healthy amount of weight to lose per week is usually 1-2 lb (0.5-0.9 kg). This usually means reducing your daily calorie intake by 500-750 calories.  The number of calories in a food can be found on a Nutrition Facts label. If a food does not have a Nutrition Facts label, try to look up the calories online or ask your dietitian for help.  Use your calories on foods and drinks that will fill you up, and not on foods and drinks that will leave you hungry.  Use smaller plates, glasses, and bowls to prevent overeating. This information is not intended to replace advice given to you by your health care provider. Make sure you discuss any questions you have with your health care provider. Document Released: 01/01/2005 Document Revised: 09/20/2017 Document Reviewed: 12/02/2015 Elsevier Interactive Patient Education  2019 [employer].

## 2018-07-09 ENCOUNTER — Other Ambulatory Visit: Payer: Self-pay | Admitting: *Deleted

## 2018-07-09 DIAGNOSIS — M722 Plantar fascial fibromatosis: Secondary | ICD-10-CM

## 2018-07-09 MED ORDER — TRAMADOL HCL 50 MG PO TABS: 50.0000 mg | ORAL_TABLET | Freq: Three times a day (TID) | ORAL | 0 refills | Status: DC | PRN

## 2018-07-09 MED ORDER — MELOXICAM 15 MG PO TABS: 15.0000 mg | ORAL_TABLET | Freq: Every day | ORAL | 0 refills | Status: DC

## 2018-07-09 NOTE — Telephone Encounter (Signed)
Patient left a message on the nurse line and wanted a refill of the tramadol and mobic and per Dr Cannon Kettle was ok with the refills and I send the medicines over to the pharmacy and also called and left a message for the patient stating that the medicines were sent to [facility]. Lattie Haw

## 2018-07-10 ENCOUNTER — Telehealth: Payer: Self-pay

## 2018-07-10 ENCOUNTER — Other Ambulatory Visit: Payer: Self-pay | Admitting: Sports Medicine

## 2018-07-10 NOTE — Telephone Encounter (Signed)
Dr. Stover please advise 

## 2018-07-10 NOTE — Telephone Encounter (Signed)
I sent over the mobic and tramadol yesterday to the pharmacy and called and left the patient a message. Lattie Haw

## 2018-07-10 NOTE — Telephone Encounter (Signed)
Patient called wanting a refill on Rx Tramadol

## 2018-07-10 NOTE — Progress Notes (Signed)
Tramadol and Mobic was sent yesterday -Dr. Cannon Kettle

## 2018-07-11 ENCOUNTER — Telehealth: Payer: Self-pay | Admitting: Sports Medicine

## 2018-07-11 NOTE — Telephone Encounter (Signed)
Would like Tramadol refilled and sent to [facility] on [address].

## 2018-07-11 NOTE — Telephone Encounter (Signed)
Kara Hall spoke with Dr Cannon Kettle and sent this rx yesterday

## 2018-07-14 ENCOUNTER — Other Ambulatory Visit: Payer: Self-pay | Admitting: Sports Medicine

## 2018-07-14 DIAGNOSIS — M722 Plantar fascial fibromatosis: Secondary | ICD-10-CM

## 2018-07-14 DIAGNOSIS — M79671 Pain in right foot: Secondary | ICD-10-CM

## 2018-07-14 MED ORDER — TRAMADOL HCL 50 MG PO TABS: 50.0000 mg | ORAL_TABLET | Freq: Four times a day (QID) | ORAL | 0 refills | Status: DC | PRN

## 2018-07-14 NOTE — Progress Notes (Signed)
Resent tramadol -Dr. Cannon Kettle

## 2018-07-29 ENCOUNTER — Ambulatory Visit (INDEPENDENT_AMBULATORY_CARE_PROVIDER_SITE_OTHER): Payer: BLUE CROSS/BLUE SHIELD | Admitting: Orthotics

## 2018-07-29 ENCOUNTER — Other Ambulatory Visit: Payer: Self-pay

## 2018-07-29 DIAGNOSIS — M6788 Other specified disorders of synovium and tendon, other site: Secondary | ICD-10-CM

## 2018-07-29 DIAGNOSIS — M722 Plantar fascial fibromatosis: Secondary | ICD-10-CM

## 2018-07-29 NOTE — Progress Notes (Signed)

## 2018-08-07 ENCOUNTER — Encounter: Payer: Self-pay | Admitting: Podiatry

## 2018-08-16 ENCOUNTER — Other Ambulatory Visit: Payer: Self-pay | Admitting: Podiatry

## 2018-08-16 MED ORDER — TRAMADOL HCL 50 MG PO TABS: 50.0000 mg | ORAL_TABLET | Freq: Three times a day (TID) | ORAL | 0 refills | Status: AC | PRN

## 2018-08-16 NOTE — Progress Notes (Signed)
Patient called stating that she was having to be on her feet a lot more and she has been stretching, icing and she is awaiting orthotics. She was asking for tramadol to get her through the weekend and would like to make an appointment with Dr. Cannon Kettle for Tuesday. Encouraged to continue with ice, supportive shoes, stretching. I sent tramadol to the pharmacy and message sent to schedulers about appointment with Dr. Cannon Kettle on Tuesday.   Trula Slade

## 2018-08-19 ENCOUNTER — Encounter: Payer: Self-pay | Admitting: Sports Medicine

## 2018-08-19 ENCOUNTER — Ambulatory Visit: Payer: BLUE CROSS/BLUE SHIELD | Admitting: Sports Medicine

## 2018-08-19 ENCOUNTER — Other Ambulatory Visit: Payer: Self-pay

## 2018-08-19 VITALS — Temp 97.9°F

## 2018-08-19 DIAGNOSIS — M722 Plantar fascial fibromatosis: Secondary | ICD-10-CM

## 2018-08-19 DIAGNOSIS — M779 Enthesopathy, unspecified: Secondary | ICD-10-CM

## 2018-08-19 DIAGNOSIS — M79671 Pain in right foot: Secondary | ICD-10-CM

## 2018-08-19 MED ORDER — TRIAMCINOLONE ACETONIDE 40 MG/ML IJ SUSP
20.0000 mg | Freq: Once | INTRAMUSCULAR | Status: AC
  Administered 2018-08-19: 20 mg

## 2018-08-19 NOTE — Patient Instructions (Signed)

## 2018-08-19 NOTE — Progress Notes (Signed)
Subjective: Kara Hall is a 48 y.o. female patient returns office for follow-up evaluation of Right heel. Reports she has been dealing with a flare up since the weekend, started to hurt 1 week ago but then weekend had to do a lot of walking and standing and became very painful. Patient is also here for PUO. Denies any other new problems or any acute issues. No other pedal complaints.   Patient Active Problem List   Diagnosis Date Noted  . Dyspareunia in female 03/24/2018  . Increased body mass index 03/24/2018  . Carpal tunnel syndrome on both sides 12/10/2017  . Achilles tendon contracture, left 10/04/2016  . Displaced fracture of fifth metatarsal bone of left foot with delayed healing 05/07/2016  . Adjustment disorder with mixed anxiety and depressed mood 11/28/2015  . Bilateral wrist pain 11/28/2015  . History of thyroid cancer 08/09/2013  . Anxiety 01/12/2013    Current Outpatient Medications on File Prior to Visit  Medication Sig Dispense Refill  . ALPRAZolam (XANAX) 0.5 MG tablet Take 0.5-1 tablets (0.25-0.5 mg total) by mouth daily as needed for anxiety. 30 tablet 0  . fluticasone (FLONASE) 50 MCG/ACT nasal spray Place 2 sprays into both nostrils daily. 16 g 12  . levothyroxine (SYNTHROID, LEVOTHROID) 125 MCG tablet Take 125 mcg by mouth daily before breakfast.     . meloxicam (MOBIC) 15 MG tablet Take 1 tablet (15 mg total) by mouth daily. 30 tablet 0  . sertraline (ZOLOFT) 25 MG tablet Take 1 tablet (25 mg total) by mouth daily. 90 tablet 0  . traMADol (ULTRAM) 50 MG tablet Take 1 tablet (50 mg total) by mouth every 8 (eight) hours as needed for up to 5 days. 15 tablet 0   No current facility-administered medications on file prior to visit.     Allergies  Allergen Reactions  . Sulfa Antibiotics Other (See Comments) and Nausea And Vomiting    Severe bladder, and kidney infection  . Trazodone And Nefazodone Other (See Comments)    Headache every am Other  reaction(s): Other Headache every am    Objective: Physical Exam General: The patient is alert and oriented x3 in no acute distress.  Dermatology: Skin is warm, dry and supple bilateral lower extremities. Nails 1-10 are normal. There is no erythema, edema, no eccymosis, no open lesions present. Integument is otherwise unremarkable.  Vascular: Dorsalis Pedis pulse and Posterior Tibial pulse are 2/4 bilateral. Capillary fill time is immediate to all digits.  Neurological: Grossly intact to light touch with an achilles reflex of +2/5 and a negative Tinel's sign bilateral.  Musculoskeletal: Tenderness to palpation at the medial calcaneal tubercale and through the insertion of the plantar fascia on the right with mild lateral foot and ankle pain along the peroneal tendon on right. There is no pain with compression of calcaneus bilateral. No pain with tuning fork to calcaneus bilateral. No pain with calf compression bilateral. There is decreased Ankle joint range of motion bilateral. All other joints range of motion within normal limits bilateral. Strength 5/5 in all groups bilateral.   Assessment and Plan: Problem List Items Addressed This Visit    None    Visit Diagnoses    Plantar fasciitis of right foot    -  Primary   Tendinitis       Inflammatory pain of right heel          -Complete examination performed.  -Re-Discussed with patient in detail the condition of plantar fasciitis right with tendonitis  -  After oral consent and aseptic prep, injected a mixture containing 1 ml of 2%  plain lidocaine, 1 ml 0.5% plain marcaine, 0.5 ml of kenalog 40 and 0.5 ml of dexamethasone phosphate into R plantar fascia without complication. Post-injection care discussed with patient.  -Dispensed custom orthotics with wear instructions dispensed  -Continue with good supportive shoes -Patient to return to office as needed or sooner if problems or issues arise.  Landis Martins, DPM

## 2018-08-26 ENCOUNTER — Other Ambulatory Visit: Payer: BLUE CROSS/BLUE SHIELD | Admitting: Orthotics

## 2018-09-08 ENCOUNTER — Telehealth: Payer: Self-pay | Admitting: Sports Medicine

## 2018-09-08 MED ORDER — MELOXICAM 15 MG PO TABS: 15.0000 mg | ORAL_TABLET | Freq: Every day | ORAL | 0 refills | Status: DC

## 2018-09-08 NOTE — Telephone Encounter (Signed)
Requesting a refill of pain medication. States she only has three left.

## 2018-09-08 NOTE — Telephone Encounter (Signed)
Left message informing pt the prescription for the meloxicam had been refilled, if she continued to have pain she would need an appt.

## 2018-09-08 NOTE — Addendum Note (Signed)
Addended by: Harriett Sine D on: 09/08/2018 01:34 PM   Modules accepted: Orders

## 2018-09-19 ENCOUNTER — Telehealth: Payer: Self-pay | Admitting: Sports Medicine

## 2018-09-19 ENCOUNTER — Other Ambulatory Visit: Payer: Self-pay | Admitting: Sports Medicine

## 2018-09-19 MED ORDER — TRAMADOL HCL 50 MG PO TABS: 50.0000 mg | ORAL_TABLET | Freq: Four times a day (QID) | ORAL | 0 refills | Status: AC | PRN

## 2018-09-19 NOTE — Telephone Encounter (Signed)
I spoke with pt and asked what pain medication she was requesting and pt states the tramadol. I reviewed clinicals and see 07/14/2018 Dr. Cannon Kettle ordered tramadol through Orders Only.

## 2018-09-19 NOTE — Addendum Note (Signed)
Addended by: Harriett Sine D on: 09/19/2018 11:12 AM   Modules accepted: Orders

## 2018-09-19 NOTE — Telephone Encounter (Signed)
Ok thank you 

## 2018-09-19 NOTE — Telephone Encounter (Signed)
Left message ordering tramadol.

## 2018-09-19 NOTE — Telephone Encounter (Signed)
Pt was to receive a refill on her pain medication but she received it on Meloxicam? She wanted to know if the pain medication could be refilled, Meloxicam does not work for her pain. Please call patient

## 2018-09-29 ENCOUNTER — Ambulatory Visit: Payer: BC Managed Care – PPO | Admitting: Family Medicine

## 2018-10-07 DIAGNOSIS — Z23 Encounter for immunization: Secondary | ICD-10-CM | POA: Diagnosis not present

## 2018-10-08 DIAGNOSIS — D219 Benign neoplasm of connective and other soft tissue, unspecified: Secondary | ICD-10-CM | POA: Insufficient documentation

## 2018-10-08 DIAGNOSIS — N926 Irregular menstruation, unspecified: Secondary | ICD-10-CM | POA: Insufficient documentation

## 2018-10-08 DIAGNOSIS — N941 Unspecified dyspareunia: Secondary | ICD-10-CM | POA: Diagnosis not present

## 2018-10-08 DIAGNOSIS — Z6829 Body mass index (BMI) 29.0-29.9, adult: Secondary | ICD-10-CM | POA: Diagnosis not present

## 2018-10-13 ENCOUNTER — Telehealth: Payer: Self-pay | Admitting: *Deleted

## 2018-10-13 ENCOUNTER — Other Ambulatory Visit: Payer: Self-pay | Admitting: Obstetrics and Gynecology

## 2018-10-13 DIAGNOSIS — D259 Leiomyoma of uterus, unspecified: Secondary | ICD-10-CM

## 2018-10-13 NOTE — Telephone Encounter (Signed)
Called pt to schedule for UFE consult. Pt states she has a consult for hysterectomy on Friday, will call back if she decides on UFE./vm

## 2018-10-17 DIAGNOSIS — D259 Leiomyoma of uterus, unspecified: Secondary | ICD-10-CM | POA: Diagnosis not present

## 2018-10-17 DIAGNOSIS — N920 Excessive and frequent menstruation with regular cycle: Secondary | ICD-10-CM | POA: Diagnosis not present

## 2018-11-10 ENCOUNTER — Other Ambulatory Visit: Payer: Self-pay | Admitting: Obstetrics and Gynecology

## 2018-11-10 DIAGNOSIS — D259 Leiomyoma of uterus, unspecified: Secondary | ICD-10-CM

## 2018-11-13 DIAGNOSIS — R519 Headache, unspecified: Secondary | ICD-10-CM | POA: Diagnosis not present

## 2018-11-13 DIAGNOSIS — N926 Irregular menstruation, unspecified: Secondary | ICD-10-CM | POA: Diagnosis not present

## 2018-11-19 ENCOUNTER — Encounter (HOSPITAL_COMMUNITY): Payer: Self-pay | Admitting: Emergency Medicine

## 2018-11-19 ENCOUNTER — Emergency Department (HOSPITAL_COMMUNITY)
Admission: EM | Admit: 2018-11-19 | Discharge: 2018-11-20 | Disposition: A | Discharge status: Home / Self Care | Payer: BC Managed Care – PPO | Attending: Emergency Medicine | Admitting: Emergency Medicine

## 2018-11-19 ENCOUNTER — Other Ambulatory Visit: Payer: Self-pay

## 2018-11-19 DIAGNOSIS — Z79899 Other long term (current) drug therapy: Secondary | ICD-10-CM | POA: Diagnosis not present

## 2018-11-19 DIAGNOSIS — F419 Anxiety disorder, unspecified: Secondary | ICD-10-CM

## 2018-11-19 DIAGNOSIS — Z85850 Personal history of malignant neoplasm of thyroid: Secondary | ICD-10-CM | POA: Insufficient documentation

## 2018-11-19 DIAGNOSIS — R519 Headache, unspecified: Secondary | ICD-10-CM | POA: Diagnosis not present

## 2018-11-19 DIAGNOSIS — I10 Essential (primary) hypertension: Secondary | ICD-10-CM

## 2018-11-19 HISTORY — DX: Benign neoplasm of connective and other soft tissue, unspecified: D21.9

## 2018-11-19 MED ORDER — OXYCODONE-ACETAMINOPHEN 5-325 MG PO TABS
1.0000 | ORAL_TABLET | ORAL | Status: DC | PRN
  Administered 2018-11-19: 1 via ORAL
  Filled 2018-11-19: qty 1

## 2018-11-19 NOTE — ED Triage Notes (Signed)
Pt extremely anxious.  C/o high blood pressure today.  Reports headache x 1 week.    No neuro deficits noted on triage exam.

## 2018-11-19 NOTE — ED Notes (Signed)
Patient up to sort requesting something for headache

## 2018-11-20 ENCOUNTER — Emergency Department (HOSPITAL_COMMUNITY): Payer: BC Managed Care – PPO

## 2018-11-20 DIAGNOSIS — R519 Headache, unspecified: Secondary | ICD-10-CM | POA: Diagnosis not present

## 2018-11-20 LAB — BASIC METABOLIC PANEL
Anion gap: 13 (ref 5–15)
BUN: 10 mg/dL (ref 6–20)
CO2: 23 mmol/L (ref 22–32)
Calcium: 8.2 mg/dL — ABNORMAL LOW (ref 8.9–10.3)
Chloride: 100 mmol/L (ref 98–111)
Creatinine, Ser: 0.67 mg/dL (ref 0.44–1.00)
GFR calc Af Amer: >60 mL/min (ref >60)
GFR calc non Af Amer: >60 mL/min (ref >60)
Glucose, Bld: 102 mg/dL — ABNORMAL HIGH (ref 70–99)
Potassium: 3.5 mmol/L (ref 3.5–5.1)
Sodium: 136 mmol/L (ref 135–145)

## 2018-11-20 MED ORDER — KETOROLAC TROMETHAMINE 15 MG/ML IJ SOLN
15.0000 mg | Freq: Once | INTRAMUSCULAR | Status: AC
  Administered 2018-11-20: 15 mg via INTRAVENOUS
  Filled 2018-11-20: qty 1

## 2018-11-20 MED ORDER — ALPRAZOLAM 0.25 MG PO TABS
0.5000 mg | ORAL_TABLET | Freq: Once | ORAL | Status: AC
  Administered 2018-11-20: 0.5 mg via ORAL
  Filled 2018-11-20: qty 2

## 2018-11-20 MED ORDER — METOCLOPRAMIDE HCL 5 MG/ML IJ SOLN
10.0000 mg | Freq: Once | INTRAMUSCULAR | Status: AC
  Administered 2018-11-20: 10 mg via INTRAVENOUS
  Filled 2018-11-20: qty 2

## 2018-11-20 MED ORDER — AMLODIPINE BESYLATE 5 MG PO TABS: 5.0000 mg | ORAL_TABLET | Freq: Every day | ORAL | 0 refills | Status: DC

## 2018-11-20 MED ORDER — DIPHENHYDRAMINE HCL 50 MG/ML IJ SOLN
12.5000 mg | Freq: Once | INTRAMUSCULAR | Status: AC
  Administered 2018-11-20: 12.5 mg via INTRAVENOUS
  Filled 2018-11-20: qty 1

## 2018-11-20 NOTE — ED Provider Notes (Signed)
[facility] EMERGENCY DEPARTMENT Provider Note   CSN: [id number] Arrival date & time: 11/19/18  1751     History   Chief Complaint Chief Complaint  Patient presents with   Hypertension   Headache    HPI Kara Hall is a 48 y.o. female.     Patient to ED with husband at bedside with c/o headache that has been persistent for the past one week. She feels this may be due to high blood pressure which has been found on exams in the past. She is not treated for hypertension. No fever, visual changes, weakness, dizziness or syncope. She bought a blood pressure cuff today and reports that multiple measurements have been very high throughout the day. She feels this is causing an exacerbation of her anxiety now. No cough, CP, SOB, vomiting. Remote history of migraine headaches. Tylenol without relief.   The history is provided by the patient. No language interpreter was used.    Past Medical History:  Diagnosis Date   Anemia    Anxiety    panic attack, also reports problems with depression, relative to her job & upcoming surgery , relative to life problems - with children & spouse     Back complaints 2013   has had steroid injection    Depression    DVT (deep venous thrombosis) ([place]) 2001   RLE   Fibroids    Foot fracture, left    Migraine    h/o migraines - as a teenager  & again now with family issues, states she has to lay down & rest in a dark room  (08/28/2013)   Muscle strain of hand    wearing home made splint today, L hand   Peripheral vascular disease ([place])    DVT- per pt. in 2001- had vein removed.    Refusal of blood transfusions as patient is Jehovah's Witness    Thyroid cancer ([place])    Thyroid nodule     Patient Active Problem List   Diagnosis Date Noted   Dyspareunia in female 03/24/2018   Increased body mass index 03/24/2018   Carpal tunnel syndrome on both sides 12/10/2017   Achilles tendon contracture, left  10/04/2016   Displaced fracture of fifth metatarsal bone of left foot with delayed healing 05/07/2016   Adjustment disorder with mixed anxiety and depressed mood 11/28/2015   Bilateral wrist pain 11/28/2015   History of thyroid cancer 08/09/2013   Anxiety 01/12/2013    Past Surgical History:  Procedure Laterality Date   THROMBECTOMY Right 2001   leg; after 3rd child was born; for deep vein thrombosis   THYROIDECTOMY Right 08/28/2013   Procedure: RIGHT THYROID LOBECTOMY POSSIBLE TOTAL THYROIDECTOMY;  Surgeon: Jodi Marble, MD;  Location: [place];  Service: ENT;  Laterality: Right;   THYROIDECTOMY Left 09/10/2013   Procedure: COMPLETE LEFT THYROIDECTOMY;  Surgeon: Jodi Marble, MD;  Location: [place];  Service: ENT;  Laterality: Left;   TUBAL LIGATION  2001     OB History   No obstetric history on file.      Home Medications    Prior to Admission medications   Medication Sig Start Date End Date Taking? Authorizing Provider  ALPRAZolam [name]) 0.5 MG tablet Take 0.5-1 tablets (0.25-0.5 mg total) by mouth daily as needed for anxiety. 06/26/18   Rutherford Guys, MD  fluticasone (FLONASE) 50 MCG/ACT nasal spray Place 2 sprays into both nostrils daily. 12/19/16   Ivar Drape D, PA  levothyroxine (SYNTHROID, LEVOTHROID) 125  MCG tablet Take 125 mcg by mouth daily before breakfast.     [provider]  meloxicam (MOBIC) 15 MG tablet Take 1 tablet (15 mg total) by mouth daily. 09/08/18   Landis Martins, DPM  sertraline (ZOLOFT) 25 MG tablet Take 1 tablet (25 mg total) by mouth daily. 06/26/18   Rutherford Guys, MD    Family History Family History  Problem Relation Age of Onset   Heart disease Mother    Hyperlipidemia Mother    Heart disease Father    Hyperlipidemia Father    Diabetes Father    Heart disease Brother    Hyperlipidemia Brother    Hypertension Brother    Heart disease Brother    Hyperlipidemia Brother     Social History Social  History   Tobacco Use   Smoking status: Never Smoker   Smokeless tobacco: Never Used  Substance Use Topics   Alcohol use: Yes    Comment: Cocktail or glass of wine occasionally   Drug use: No     Allergies   Sulfa antibiotics and Trazodone and nefazodone   Review of Systems Review of Systems  Constitutional: Negative for chills and fever.  HENT: Negative.   Respiratory: Negative.  Negative for shortness of breath.   Cardiovascular: Negative.  Negative for chest pain.  Gastrointestinal: Negative.  Negative for vomiting.  Musculoskeletal: Negative.  Negative for myalgias.  Skin: Negative.   Neurological: Positive for headaches. Negative for syncope, weakness, light-headedness and numbness.     Physical Exam Updated Vital Signs BP (!) 140/100 (BP Location: Right Arm)    Pulse 68    Temp 97.8 F (36.6 C)    Resp 16    LMP 11/05/2018    SpO2 98%   Physical Exam Vitals signs and nursing note reviewed.  Constitutional:      Appearance: She is well-developed.  HENT:     Head: Normocephalic.  Neck:     Musculoskeletal: Normal range of motion and neck supple.  Cardiovascular:     Rate and Rhythm: Normal rate and regular rhythm.  Pulmonary:     Effort: Pulmonary effort is normal.     Breath sounds: Normal breath sounds.  Abdominal:     General: Bowel sounds are normal.     Palpations: Abdomen is soft.     Tenderness: There is no abdominal tenderness. There is no guarding or rebound.  Musculoskeletal: Normal range of motion.  Skin:    General: Skin is warm and dry.     Findings: No rash.  Neurological:     Mental Status: She is alert.     GCS: GCS eye subscore is 4. GCS verbal subscore is 5. GCS motor subscore is 6.     Comments: CN's 3-12 grossly intact. Speech is clear and focused. No facial asymmetry. No lateralizing weakness. Reflexes are equal. No deficits of coordination. Ambulatory without imbalance.    Psychiatric:        Mood and Affect: Mood is anxious.        ED Treatments / Results  Labs (all labs ordered are listed, but only abnormal results are displayed) Labs Reviewed  BASIC METABOLIC PANEL - Abnormal; Notable for the following components:      Result Value   Glucose, Bld 102 (*)    Calcium 8.2 (*)    All other components within normal limits   Results for orders placed or performed during the hospital encounter of [id number]  Basic metabolic panel  Result Value Ref  Range   Sodium 136 135 - 145 mmol/L   Potassium 3.5 3.5 - 5.1 mmol/L   Chloride 100 98 - 111 mmol/L   CO2 23 22 - 32 mmol/L   Glucose, Bld 102 (H) 70 - 99 mg/dL   BUN 10 6 - 20 mg/dL   Creatinine, Ser 0.67 0.44 - 1.00 mg/dL   Calcium 8.2 (L) 8.9 - 10.3 mg/dL   GFR calc non Af Amer >60 >60 mL/min   GFR calc Af Amer >60 >60 mL/min   Anion gap 13 5 - 15    EKG EKG Interpretation  Date/Time:  Thursday November 20 2018 02:54:59 EST Ventricular Rate:  74 PR Interval:    QRS Duration: 112 QT Interval:  552 QTC Calculation: 613 R Axis:   73 Text Interpretation: Sinus rhythm Incomplete right bundle branch block Prolonged QT interval thate is new compared to prior nonspecific T wave flattening diffusely Confirmed by Pryor Curia [phone]) on 11/20/2018 3:08:08 AM   Radiology No results found. Ct Head Wo Contrast  Result Date: 11/20/2018 CLINICAL DATA:  Headache for 1 week.  Hypertension. EXAM: CT HEAD WITHOUT CONTRAST TECHNIQUE: Contiguous axial images were obtained from the base of the skull through the vertex without intravenous contrast. COMPARISON:  None. FINDINGS: Brain: No evidence of acute infarction, hemorrhage, hydrocephalus, extra-axial collection or mass lesion/mass effect. Vascular: No hyperdense vessel or unexpected calcification. Skull: Normal. Negative for fracture or focal lesion. Sinuses/Orbits: No acute finding. IMPRESSION: Negative head CT. Electronically Signed   By: Monte Fantasia M.D.   On: 11/20/2018 04:58    Procedures Procedures  (including critical care time)  Medications Ordered in ED Medications  oxyCODONE-acetaminophen (PERCOCET/ROXICET) 5-325 MG per tablet 1 tablet (1 tablet Oral Given 11/19/18 2035)  ALPRAZolam [name]) tablet 0.5 mg (0.5 mg Oral Given 11/20/18 0254)     Initial Impression / Assessment and Plan / ED Course  I have reviewed the triage vital signs and the nursing notes.  Pertinent labs & imaging results that were available during my care of the patient were reviewed by me and considered in my medical decision making (see chart for details).        Patient to ED with ongoing headache, finding of elevated blood pressures and later onset anxiety. No chest pain. No other neurologic symptoms.   The patient appears extremely anxious. She is oriented, speaking clearly and with rationale. Blood pressures have fluctuated here from high of 179/119 to low of 123/94. Bmet shows no abnormalities. EKG without acute change. Patient was given Xanax per her usual dose and reports anxiety is better, but headache persists.   Head CT ordered. Headache cocktail provided with complete relief of headache. CT negative. Blood pressure is now normal.  Do not suspect intracranial bleed, infection or mass. Because her blood pressure readings have been elevated (2 that were normal) here and at home, as well as previous findings of elevated pressure, will start on Norvasc 5 mg. Discussed the possibility of hypotension and the symptoms of same. She will keep a log of BP x3 daily and is to follow up with PCP ([facility]) for recheck in 1 week.      Final Clinical Impressions(s) / ED Diagnoses   Final diagnoses:  None   1. Hypertension 2. Nonspecific headache 3. Anxiety  ED Discharge Orders    None       Charlann Lange, PA-C 11/20/18 0526    Ward, Delice Bison, DO 11/20/18 [phone]

## 2018-11-20 NOTE — Discharge Instructions (Addendum)
Please follow up with your doctor in one week for recheck. As discussed, measure your blood pressure in the morning, afternoon and evening and keep a log to take with you to your appointment.   Return to the emergency department as needed for new or worsening symptoms.

## 2018-11-24 ENCOUNTER — Telehealth: Payer: Self-pay | Admitting: Family Medicine

## 2018-11-24 ENCOUNTER — Telehealth (INDEPENDENT_AMBULATORY_CARE_PROVIDER_SITE_OTHER): Payer: BC Managed Care – PPO | Admitting: Family Medicine

## 2018-11-24 ENCOUNTER — Encounter: Payer: Self-pay | Admitting: Family Medicine

## 2018-11-24 VITALS — Temp 98.1°F

## 2018-11-24 DIAGNOSIS — I10 Essential (primary) hypertension: Secondary | ICD-10-CM | POA: Diagnosis not present

## 2018-11-24 DIAGNOSIS — R9431 Abnormal electrocardiogram [ECG] [EKG]: Secondary | ICD-10-CM | POA: Diagnosis not present

## 2018-11-24 DIAGNOSIS — F4323 Adjustment disorder with mixed anxiety and depressed mood: Secondary | ICD-10-CM | POA: Diagnosis not present

## 2018-11-24 MED ORDER — AMLODIPINE BESYLATE 10 MG PO TABS: 10.0000 mg | ORAL_TABLET | Freq: Every day | ORAL | 2 refills | Status: DC

## 2018-11-24 MED ORDER — ALPRAZOLAM 0.5 MG PO TABS: 0.2500 mg | ORAL_TABLET | Freq: Every day | ORAL | 0 refills | Status: DC | PRN

## 2018-11-24 NOTE — Patient Instructions (Signed)
° ° ° °  If you have lab work done today you will be contacted with your lab results within the next 2 weeks.  If you have not heard from us then please contact us. The fastest way to get your results is to register for My Chart. ° ° °IF you received an x-ray today, you will receive an invoice from [facility]. Please contact [facility] at [phone] with questions or concerns regarding your invoice.  ° °IF you received labwork today, you will receive an invoice from [facility]. Please contact [facility] at [phone] with questions or concerns regarding your invoice.  ° °Our billing staff will not be able to assist you with questions regarding bills from these companies. ° °You will be contacted with the lab results as soon as they are available. The fastest way to get your results is to activate your My Chart account. Instructions are located on the last page of this paperwork. If you have not heard from us regarding the results in 2 weeks, please contact this office. °  ° ° ° °

## 2018-11-24 NOTE — Progress Notes (Signed)
Noted: Hysterectomy on Dec 3rd.   Needs refill  BP- Amlodipine  Xanax- for Surgery. High strung  Home Reading 160/98 this Am   PHQ9 = 11

## 2018-11-24 NOTE — Telephone Encounter (Signed)
Pt wants to tell pcp that she's going to have a hysterectomy. Pt has fibroids  Pt requesting med refill on xanax. Has telemed today

## 2018-11-24 NOTE — Progress Notes (Signed)
Virtual Visit Note  I connected with patient on 11/24/18 at 456pm by phone and verified that I am speaking with the correct person using two identifiers. Kara Hall is currently located at work and patient is currently with them during visit. The provider, Rutherford Guys, MD is located in their office at time of visit.  I discussed the limitations, risks, security and privacy concerns of performing an evaluation and management service by telephone and the availability of in person appointments. I also discussed with the patient that there may be a patient responsible charge related to this service. The patient expressed understanding and agreed to proceed.   CC: ER followup  HPI ? PMH: hypothryodism s/p thyroid cancer, GAD, chronic pain, RLE DVT, migraines   Seen in ER nov 4th for HTN, headaches, anxiety EKG with QTc 510 Started on amlodipine 5mg  ER records reviewed She has been taking daily, she has been on it so far for 3 days, tolerating well Has BP cuff at home, ~ 150 Headaches have become better since seen in ER She stopped OCPs x 2 weeks ago  She has been very stressed at work when suddenly she has become the only employee  Has upcoming hysterectomy for fibroids, painful, heavy bleeding, started on OCPs, started having headaches and elevated BP However had to postponed due to ob gyn having positive for covid  pmp reviewed  She continues to see endo yearly  Lab Results  Component Value Date   CREATININE 0.67 11/20/2018   BUN 10 11/20/2018   NA 136 11/20/2018   K 3.5 11/20/2018   CL 100 11/20/2018   CO2 23 11/20/2018    BP Readings from Last 3 Encounters:  11/20/18 (!) 136/101  06/26/18 135/90  12/31/17 (!) 143/92    Allergies  Allergen Reactions  . Sulfa Antibiotics Other (See Comments) and Nausea And Vomiting    Severe bladder, and kidney infection  . Trazodone And Nefazodone Other (See Comments)    Headache every am     Prior to Admission  medications   Medication Sig Start Date End Date Taking? Authorizing Provider  ALPRAZolam [name]) 0.5 MG tablet Take 0.5-1 tablets (0.25-0.5 mg total) by mouth daily as needed for anxiety. 06/26/18  Yes Rutherford Guys, MD  amLODipine (NORVASC) 5 MG tablet Take 1 tablet (5 mg total) by mouth daily. 11/20/18  Yes Upstill, Nehemiah Settle, PA-C  fluticasone (FLONASE) 50 MCG/ACT nasal spray Place 2 sprays into both nostrils daily. Patient not taking: Reported on 11/24/2018 12/19/16  Yes English, Stephanie D, PA  levothyroxine (SYNTHROID, LEVOTHROID) 125 MCG tablet Take 125 mcg by mouth daily before breakfast.    Yes [provider]  meloxicam (MOBIC) 15 MG tablet Take 1 tablet (15 mg total) by mouth daily. Patient not taking: Reported on 11/24/2018 09/08/18  Yes Stover, Titorya, DPM  sertraline (ZOLOFT) 25 MG tablet Take 1 tablet (25 mg total) by mouth daily. 06/26/18  Yes Rutherford Guys, MD  traMADol (ULTRAM) 50 MG tablet Take 50 mg by mouth every 6 (six) hours as needed for moderate pain.    Yes [provider]    Past Medical History:  Diagnosis Date  . Anemia   . Anxiety    panic attack, also reports problems with depression, relative to her job & upcoming surgery , relative to life problems - with children & spouse    . Back complaints 2013   has had steroid injection   . Depression   . DVT (  deep venous thrombosis) ([place]) 2001   RLE  . Fibroids   . Foot fracture, left   . Migraine    h/o migraines - as a teenager  & again now with family issues, states she has to lay down & rest in a dark room  (08/28/2013)  . Muscle strain of hand    wearing home made splint today, L hand  . Peripheral vascular disease ([facility])    DVT- per pt. in 2001- had vein removed.   . Refusal of blood transfusions as patient is Jehovah's Witness   . Thyroid cancer ([place])   . Thyroid nodule     Past Surgical History:  Procedure Laterality Date  . THROMBECTOMY Right 2001   leg; after 3rd child was born;  for deep vein thrombosis  . THYROIDECTOMY Right 08/28/2013   Procedure: RIGHT THYROID LOBECTOMY POSSIBLE TOTAL THYROIDECTOMY;  Surgeon: Jodi Marble, MD;  Location: [place];  Service: ENT;  Laterality: Right;  . THYROIDECTOMY Left 09/10/2013   Procedure: COMPLETE LEFT THYROIDECTOMY;  Surgeon: Jodi Marble, MD;  Location: [place];  Service: ENT;  Laterality: Left;  . TUBAL LIGATION  2001    Social History   Tobacco Use  . Smoking status: Never Smoker  . Smokeless tobacco: Never Used  Substance Use Topics  . Alcohol use: Yes    Comment: Cocktail or glass of wine occasionally    Family History  Problem Relation Age of Onset  . Heart disease Mother   . Hyperlipidemia Mother   . Heart disease Father   . Hyperlipidemia Father   . Diabetes Father   . Heart disease Brother   . Hyperlipidemia Brother   . Hypertension Brother   . Heart disease Brother   . Hyperlipidemia Brother     Review of Systems  Constitutional: Negative for chills and fever.  Respiratory: Negative for cough and shortness of breath.   Cardiovascular: Negative for chest pain, palpitations and leg swelling.  Gastrointestinal: Negative for abdominal pain, nausea and vomiting.  Neurological: Positive for headaches.  Psychiatric/Behavioral: The patient is nervous/anxious.     Objective  Vitals as reported by the patient: per hpi   ASSESSMENT and PLAN  1. Essential hypertension, benign Above goal, increase amlodipine to 10mg , cont home BP monitoring  2. Adjustment disorder with mixed anxiety and depressed mood pmp reviewed. - ALPRAZolam (XANAX) 0.5 MG tablet; Take 0.5-1 tablets (0.25-0.5 mg total) by mouth daily as needed for anxiety.  3. Prolonged Q-T interval on ECG Discussed avoidance of meds, repeat EKG at next OV  Other orders - traMADol (ULTRAM) 50 MG tablet; Take 50 mg by mouth every 6 (six) hours as needed for moderate pain.  - amLODipine (NORVASC) 10 MG tablet; Take 1 tablet (10 mg total) by mouth  daily.  FOLLOW-UP: 2 weeks, BP    The above assessment and management plan was discussed with the patient. The patient verbalized understanding of and has agreed to the management plan. Patient is aware to call the clinic if symptoms persist or worsen. Patient is aware when to return to the clinic for a follow-up visit. Patient educated on when it is appropriate to go to the emergency department.    I provided 13 minutes of non-face-to-face time during this encounter.  Rutherford Guys, MD [facility] [place], [place] [address] Ph.  [phone] Fax [phone]

## 2018-11-25 ENCOUNTER — Telehealth: Payer: Self-pay | Admitting: Family Medicine

## 2018-11-25 NOTE — Telephone Encounter (Addendum)
11/25/2018 - PATIENT HAD A TELEMED VISIT WITH DR. Benay Spice ON Monday (11/9//2020). DR. Benay Spice HAS REQUESTED PATIENT HAVE A 2 WEEK RECHECK WITH HER TO FOLLOW-UP ON HER HTN. I TRIED TO CALL AND SCHEDULE BUT HAD TO LEAVE HER A VOICE MAIL TO RETURN MY CALL. Kara Hall

## 2018-11-26 NOTE — Patient Instructions (Signed)
YOU ARE SCHEDULED FOR A COVID TEST _________@____________ . THIS TEST MUST BE DONE BEFORE SURGERY. GO TO  [address], [place], [address] AND REMAIN IN YOUR CAR, THIS IS A DRIVE UP TEST. ONCE YOUR COVID TEST IS DONE PLEASE FOLLOW ALL THE QUARANTINE  INSTRUCTIONS GIVEN IN YOUR HANDOUT.      Your procedure is scheduled on 12-03-18  Report to [place]. M.   Call this number if you have problems the morning of surgery  [phone].   OUR ADDRESS IS [address].  WE ARE LOCATED IN [facility].                                     REMEMBER:  DO NOT EAT FOOD OR DRINK LIQUIDS AFTER MIDNIGHT .  YOU MAY  BRUSH YOUR TEETH MORNING OF SURGERY AND RINSE YOUR MOUTH OUT, NO CHEWING GUM CANDY OR MINTS.   TAKE THESE MEDICATIONS MORNING OF SURGERY WITH A SIP OF WATER:  __zoloft, levothyroxine,amlodipine and xanax if needed________________________________  IF YOU ARE SPENDING THE NIGHT AFTER SURGERY PLEASE BRING ALL YOUR PRESCRIPTION MEDICATIONS IN THEIR ORIGINAL BOTTLES. 1 VISITOR IS ALLOWED IN WAITING ROOM ONLY DAY OF SURGERY. NO VISITOR MAY SPEND THE NIGHT. VISITOR ARE ALLOWED TO STAY UNTIL 800 PM.                                    DO NOT WEAR JEWERLY, MAKE UP, OR NAIL POLISH ON FINGERNAILS. DO NOT WEAR LOTIONS, POWDERS, PERFUMES OR DEODORANT. DO NOT SHAVE FOR 24 HOURS PRIOR TO DAY OF SURGERY. MEN MAY SHAVE FACE AND NECK. CONTACTS, GLASSES, OR DENTURES MAY NOT BE WORN TO SURGERY.                                    [facility] IS NOT RESPONSIBLE  FOR ANY BELONGINGS.                                                                    .                                                                                                       _____________________________________________________________________             [facility] - Preparing for Surgery Before surgery, you can play an important role.  Because skin is not  sterile, your skin needs to be as free of germs as possible.  You  can reduce the number of germs on your skin by washing with CHG (chlorahexidine gluconate) soap before surgery.  CHG is an antiseptic cleaner which kills germs and bonds with the skin to continue killing germs even after washing. Please DO NOT use if you have an allergy to CHG or antibacterial soaps.  If your skin becomes reddened/irritated stop using the CHG and inform your nurse when you arrive at Short Stay. Do not shave (including legs and underarms) for at least 48 hours prior to the first CHG shower.  You may shave your face/neck. Please follow these instructions carefully:  1.  Shower with CHG Soap the night before surgery and the  morning of Surgery.  2.  If you choose to wash your hair, wash your hair first as usual with your  normal  shampoo.  3.  After you shampoo, rinse your hair and body thoroughly to remove the  shampoo.                           4.  Use CHG as you would any other liquid soap.  You can apply chg directly  to the skin and wash                       Gently with a scrungie or clean washcloth.  5.  Apply the CHG Soap to your body ONLY FROM THE NECK DOWN.   Do not use on face/ open                           Wound or open sores. Avoid contact with eyes, ears mouth and genitals (private parts).                       Wash face,  Genitals (private parts) with your normal soap.             6.  Wash thoroughly, paying special attention to the area where your surgery  will be performed.  7.  Thoroughly rinse your body with warm water from the neck down.  8.  DO NOT shower/wash with your normal soap after using and rinsing off  the CHG Soap.                9.  Pat yourself dry with a clean towel.            10.  Wear clean pajamas.            11.  Place clean sheets on your bed the night of your first shower and do not  sleep with pets. Day of Surgery : Do not apply any lotions/deodorants the morning of surgery.   Please wear clean clothes to the hospital/surgery center.  FAILURE TO FOLLOW THESE INSTRUCTIONS MAY RESULT IN THE CANCELLATION OF YOUR SURGERY PATIENT SIGNATURE_________________________________  NURSE SIGNATURE__________________________________  ________________________________________________________________________   Adam Phenix  An incentive spirometer is a tool that can help keep your lungs clear and active. This tool measures how well you are filling your lungs with each breath. Taking long deep breaths may help reverse or decrease the chance of developing breathing (pulmonary) problems (especially infection) following:  A long period of time when you are unable to move or be active. BEFORE THE PROCEDURE   If the spirometer includes an indicator to show your best effort, your nurse or respiratory therapist will set it  to a desired goal.  If possible, sit up straight or lean slightly forward. Try not to slouch.  Hold the incentive spirometer in an upright position. INSTRUCTIONS FOR USE  1. Sit on the edge of your bed if possible, or sit up as far as you can in bed or on a chair. 2. Hold the incentive spirometer in an upright position. 3. Breathe out normally. 4. Place the mouthpiece in your mouth and seal your lips tightly around it. 5. Breathe in slowly and as deeply as possible, raising the piston or the ball toward the top of the column. 6. Hold your breath for 3-5 seconds or for as long as possible. Allow the piston or ball to fall to the bottom of the column. 7. Remove the mouthpiece from your mouth and breathe out normally. 8. Rest for a few seconds and repeat Steps 1 through 7 at least 10 times every 1-2 hours when you are awake. Take your time and take a few normal breaths between deep breaths. 9. The spirometer may include an indicator to show your best effort. Use the indicator as a goal to work toward during each repetition. 10. After each set of 10 deep  breaths, practice coughing to be sure your lungs are clear. If you have an incision (the cut made at the time of surgery), support your incision when coughing by placing a pillow or rolled up towels firmly against it. Once you are able to get out of bed, walk around indoors and cough well. You may stop using the incentive spirometer when instructed by your caregiver.  RISKS AND COMPLICATIONS  Take your time so you do not get dizzy or light-headed.  If you are in pain, you may need to take or ask for pain medication before doing incentive spirometry. It is harder to take a deep breath if you are having pain. AFTER USE  Rest and breathe slowly and easily.  It can be helpful to keep track of a log of your progress. Your caregiver can provide you with a simple table to help with this. If you are using the spirometer at home, follow these instructions: [place] IF:   You are having difficultly using the spirometer.  You have trouble using the spirometer as often as instructed.  Your pain medication is not giving enough relief while using the spirometer.  You develop fever of 100.5 F (38.1 C) or higher. SEEK IMMEDIATE MEDICAL CARE IF:   You cough up bloody sputum that had not been present before.  You develop fever of 102 F (38.9 C) or greater.  You develop worsening pain at or near the incision site. MAKE SURE YOU:   Understand these instructions.  Will watch your condition.  Will get help right away if you are not doing well or get worse. Document Released: 05/14/2006 Document Revised: 03/26/2011 Document Reviewed: 07/15/2006 [employer] Patient Information 2014 [employer], [place].   ________________________________________________________________________  WHAT IS A BLOOD TRANSFUSION? Blood Transfusion Information  A transfusion is the replacement of blood or some of its parts. Blood is made up of multiple cells which provide different functions.  Red blood cells  carry oxygen and are used for blood loss replacement.  White blood cells fight against infection.  Platelets control bleeding.  Plasma helps clot blood.  Other blood products are available for specialized needs, such as hemophilia or other clotting disorders. BEFORE THE TRANSFUSION  Who gives blood for transfusions?   Healthy volunteers who are fully evaluated to  make sure their blood is safe. This is blood bank blood. Transfusion therapy is the safest it has ever been in the practice of medicine. Before blood is taken from a donor, a complete history is taken to make sure that person has no history of diseases nor engages in risky social behavior (examples are intravenous drug use or sexual activity with multiple partners). The donor's travel history is screened to minimize risk of transmitting infections, such as malaria. The donated blood is tested for signs of infectious diseases, such as HIV and hepatitis. The blood is then tested to be sure it is compatible with you in order to minimize the chance of a transfusion reaction. If you or a relative donates blood, this is often done in anticipation of surgery and is not appropriate for emergency situations. It takes many days to process the donated blood. RISKS AND COMPLICATIONS Although transfusion therapy is very safe and saves many lives, the main dangers of transfusion include:   Getting an infectious disease.  Developing a transfusion reaction. This is an allergic reaction to something in the blood you were given. Every precaution is taken to prevent this. The decision to have a blood transfusion has been considered carefully by your caregiver before blood is given. Blood is not given unless the benefits outweigh the risks. AFTER THE TRANSFUSION  Right after receiving a blood transfusion, you will usually feel much better and more energetic. This is especially true if your red blood cells have gotten low (anemic). The transfusion raises  the level of the red blood cells which carry oxygen, and this usually causes an energy increase.  The nurse administering the transfusion will monitor you carefully for complications. HOME CARE INSTRUCTIONS  No special instructions are needed after a transfusion. You may find your energy is better. Speak with your caregiver about any limitations on activity for underlying diseases you may have. SEEK MEDICAL CARE IF:   Your condition is not improving after your transfusion.  You develop redness or irritation at the intravenous (IV) site. SEEK IMMEDIATE MEDICAL CARE IF:  Any of the following symptoms occur over the next 12 hours:  Shaking chills.  You have a temperature by mouth above 102 F (38.9 C), not controlled by medicine.  Chest, back, or muscle pain.  People around you feel you are not acting correctly or are confused.  Shortness of breath or difficulty breathing.  Dizziness and fainting.  You get a rash or develop hives.  You have a decrease in urine output.  Your urine turns a dark color or changes to pink, red, or brown. Any of the following symptoms occur over the next 10 days:  You have a temperature by mouth above 102 F (38.9 C), not controlled by medicine.  Shortness of breath.  Weakness after normal activity.  The white part of the eye turns yellow (jaundice).  You have a decrease in the amount of urine or are urinating less often.  Your urine turns a dark color or changes to pink, red, or brown. Document Released: 12/30/1999 Document Revised: 03/26/2011 Document Reviewed: 08/18/2007 [facility] Patient Information 2014 [place], [place].  _______________________________________________________________________

## 2018-11-27 ENCOUNTER — Other Ambulatory Visit: Payer: BLUE CROSS/BLUE SHIELD

## 2018-11-27 ENCOUNTER — Encounter (HOSPITAL_COMMUNITY)
Admission: RE | Admit: 2018-11-27 | Discharge: 2018-11-27 | Disposition: A | Discharge status: Home / Self Care | Payer: BC Managed Care – PPO | Source: Ambulatory Visit | Attending: Obstetrics & Gynecology | Admitting: Obstetrics & Gynecology

## 2018-11-29 ENCOUNTER — Inpatient Hospital Stay (HOSPITAL_COMMUNITY): Admission: RE | Admit: 2018-11-29 | Payer: BC Managed Care – PPO | Source: Ambulatory Visit

## 2018-11-29 NOTE — Progress Notes (Signed)
Pt's covid test rescheduled due to pt's surgery being rescheduled for 12/3. Pt made aware to call the [facility] pre- admission department to reschedule her appt, as well, due to her being to early for lab work.

## 2018-12-01 ENCOUNTER — Encounter (HOSPITAL_COMMUNITY): Admission: RE | Admit: 2018-12-01 | Payer: BC Managed Care – PPO | Source: Ambulatory Visit

## 2018-12-04 NOTE — Patient Instructions (Addendum)
YOU ARE SCHEDULED FOR A COVID TEST ___11-30_____@___1 :00PM_________. THIS TEST MUST BE DONE BEFORE SURGERY. GO TO  [address], [place], [address] AND REMAIN IN YOUR CAR, THIS IS A DRIVE UP TEST. ONCE YOUR COVID TEST IS DONE PLEASE FOLLOW ALL THE QUARANTINE  INSTRUCTIONS GIVEN IN YOUR HANDOUT.     Your procedure is scheduled on 12-18-2018   Report to [facility] AT                 10:00 A. M.   Call this number if you have problems the morning of surgery  [phone].   OUR ADDRESS IS [place].  WE ARE LOCATED IN [facility].                                     REMEMBER:  NO SOLID FOOD AFTER MIDNIGHT THE NIGHT PRIOR TO SURGERY. YOU MAY DRINK CLEAR LIQUIDS.   STOP CLEAR LIQUIDS AT ___9:00AM______ AND THEN DRINK THE ENSURE PRE-SURGERY [place]. NOTHING BY MOUTH AFTER THE ENSURE DRINK!    CLEAR LIQUID DIET   Foods Allowed                                                                     Foods Excluded  Coffee and tea, regular and decaf                             liquids that you cannot  Plain Jell-O any favor except red or purple                                           see through such as: Fruit ices (not with fruit pulp)                                     milk, soups, orange juice  Iced Popsicles                                    All solid food Carbonated beverages, regular and diet                                    Cranberry, grape and apple juices Sports drinks like Gatorade Lightly seasoned clear broth or consume(fat free) Sugar, honey syrup  Sample Menu Breakfast                                Lunch                                     Supper Cranberry juice  Beef broth                            Chicken broth Jell-O                                     Grape juice                           Apple juice Coffee or tea                        Jell-O                                      Popsicle                                     Coffee or tea                        Coffee or tea  _____________________________________________________________________    YOU MAY  BRUSH YOUR TEETH MORNING OF SURGERY AND RINSE YOUR MOUTH OUT, NO CHEWING GUM CANDY OR MINTS.     TAKE THESE MEDICATIONS MORNING OF SURGERY WITH A SIP OF WATER:  zoloft, levothyroxine,amlodipine and xanax if needed    IF YOU ARE SPENDING THE NIGHT AFTER SURGERY PLEASE BRING ALL YOUR PRESCRIPTION MEDICATIONS IN THEIR ORIGINAL BOTTLES. 1 VISITOR IS ALLOWED IN WAITING ROOM ONLY DAY OF SURGERY. NO VISITOR MAY SPEND THE NIGHT. VISITOR ARE ALLOWED TO STAY UNTIL 8:00 PM.                                      DO NOT WEAR JEWERLY, MAKE UP, OR NAIL POLISH ON FINGERNAILS. DO NOT WEAR LOTIONS, POWDERS, PERFUMES OR DEODORANT. DO NOT SHAVE FOR 24 HOURS PRIOR TO DAY OF SURGERY.  CONTACTS, GLASSES, OR DENTURES MAY NOT BE WORN TO SURGERY.                                    [facility] IS NOT RESPONSIBLE  FOR ANY BELONGINGS.                                                                    Marland Kitchen  _____________________________________________________________________             [facility] - Preparing for Surgery Before surgery, you can play an important role.  Because skin is not sterile, your skin needs to be as free of germs as possible.  You can reduce the number of germs on your skin by washing with CHG (chlorahexidine gluconate) soap before surgery.  CHG is an antiseptic cleaner which kills germs and bonds with the skin to continue killing germs even after washing. Please DO NOT use if you have an allergy to CHG or antibacterial soaps.  If your skin becomes reddened/irritated stop using the CHG and inform your nurse when you arrive at Short Stay. Do not shave (including legs and underarms) for at least 48 hours prior to the first CHG  shower.  You may shave your face/neck. Please follow these instructions carefully:  1.  Shower with CHG Soap the night before surgery and the  morning of Surgery.  2.  If you choose to wash your hair, wash your hair first as usual with your  normal  shampoo.  3.  After you shampoo, rinse your hair and body thoroughly to remove the  shampoo.                           4.  Use CHG as you would any other liquid soap.  You can apply chg directly  to the skin and wash                       Gently with a scrungie or clean washcloth.  5.  Apply the CHG Soap to your body ONLY FROM THE NECK DOWN.   Do not use on face/ open                           Wound or open sores. Avoid contact with eyes, ears mouth and genitals (private parts).                       Wash face,  Genitals (private parts) with your normal soap.             6.  Wash thoroughly, paying special attention to the area where your surgery  will be performed.  7.  Thoroughly rinse your body with warm water from the neck down.  8.  DO NOT shower/wash with your normal soap after using and rinsing off  the CHG Soap.                9.  Pat yourself dry with a clean towel.            10.  Wear clean pajamas.            11.  Place clean sheets on your bed the night of your first shower and do not  sleep with pets. Day of Surgery : Do not apply any lotions/deodorants the morning of surgery.  Please wear clean clothes to the hospital/surgery center.  FAILURE TO FOLLOW THESE INSTRUCTIONS MAY RESULT IN THE CANCELLATION OF YOUR SURGERY PATIENT SIGNATURE_________________________________  NURSE SIGNATURE__________________________________  ________________________________________________________________________   Adam Phenix  An incentive spirometer is a tool that can help keep your lungs clear and active. This tool measures how well you are filling your lungs with each breath. Taking long deep breaths may help reverse or decrease the chance  of developing breathing (pulmonary) problems (especially infection) following:  A long period of time when you are unable to move or be active. BEFORE THE PROCEDURE   If the spirometer includes an indicator to show your best effort, your nurse or respiratory therapist will set it to a desired goal.  If possible, sit up straight or lean slightly forward. Try not to slouch.  Hold the incentive spirometer in an upright position. INSTRUCTIONS FOR USE  1. Sit on the edge of your bed if possible, or sit up as far as you can in bed or on a chair. 2. Hold the incentive spirometer in an upright position. 3. Breathe out normally. 4. Place the mouthpiece in your mouth and seal your lips tightly around it. 5. Breathe in slowly and as deeply as possible, raising the piston or the ball toward the top of the column. 6. Hold your breath for 3-5 seconds or for as long as possible. Allow the piston or ball to fall to the bottom of the column. 7. Remove the mouthpiece from your mouth and breathe out normally. 8. Rest for a few seconds and repeat Steps 1 through 7 at least 10 times every 1-2 hours when you are awake. Take your time and take a few normal breaths between deep breaths. 9. The spirometer may include an indicator to show your best effort. Use the indicator as a goal to work toward during each repetition. 10. After each set of 10 deep breaths, practice coughing to be sure your lungs are clear. If you have an incision (the cut made at the time of surgery), support your incision when coughing by placing a pillow or rolled up towels firmly against it. Once you are able to get out of bed, walk around indoors and cough well. You may stop using the incentive spirometer when instructed by your caregiver.  RISKS AND COMPLICATIONS  Take your time so you do not get dizzy or light-headed.  If you are in pain, you may need to take or ask for pain medication before doing incentive spirometry. It is harder to  take a deep breath if you are having pain. AFTER USE  Rest and breathe slowly and easily.  It can be helpful to keep track of a log of your progress. Your caregiver can provide you with a simple table to help with this. If you are using the spirometer at home, follow these instructions: [place] IF:   You are having difficultly using the spirometer.  You have trouble using the spirometer as often as instructed.  Your pain medication is not giving enough relief while using the spirometer.  You develop fever of 100.5 F (38.1 C) or higher. SEEK IMMEDIATE MEDICAL CARE IF:   You cough up bloody sputum that had not been present before.  You develop fever of 102 F (38.9 C) or greater.  You develop worsening pain at or near the incision site. MAKE SURE YOU:   Understand these instructions.  Will watch your condition.  Will get help right away if you are not doing well or get worse. Document Released: 05/14/2006 Document Revised: 03/26/2011 Document Reviewed: 07/15/2006 ExitCare Patient Information 2014 Exit[employer], [place].   ________________________________________________________________________  WHAT IS A BLOOD TRANSFUSION? Blood Transfusion Information  A transfusion is the replacement of blood or some of its parts. Blood is made up of multiple cells which provide different functions.  Red blood cells carry oxygen and are used for blood loss replacement.  White blood cells  fight against infection.  Platelets control bleeding.  Plasma helps clot blood.  Other blood products are available for specialized needs, such as hemophilia or other clotting disorders. BEFORE THE TRANSFUSION  Who gives blood for transfusions?   Healthy volunteers who are fully evaluated to make sure their blood is safe. This is blood bank blood. Transfusion therapy is the safest it has ever been in the practice of medicine. Before blood is taken from a donor, a complete history is taken to  make sure that person has no history of diseases nor engages in risky social behavior (examples are intravenous drug use or sexual activity with multiple partners). The donor's travel history is screened to minimize risk of transmitting infections, such as malaria. The donated blood is tested for signs of infectious diseases, such as HIV and hepatitis. The blood is then tested to be sure it is compatible with you in order to minimize the chance of a transfusion reaction. If you or a relative donates blood, this is often done in anticipation of surgery and is not appropriate for emergency situations. It takes many days to process the donated blood. RISKS AND COMPLICATIONS Although transfusion therapy is very safe and saves many lives, the main dangers of transfusion include:   Getting an infectious disease.  Developing a transfusion reaction. This is an allergic reaction to something in the blood you were given. Every precaution is taken to prevent this. The decision to have a blood transfusion has been considered carefully by your caregiver before blood is given. Blood is not given unless the benefits outweigh the risks. AFTER THE TRANSFUSION  Right after receiving a blood transfusion, you will usually feel much better and more energetic. This is especially true if your red blood cells have gotten low (anemic). The transfusion raises the level of the red blood cells which carry oxygen, and this usually causes an energy increase.  The nurse administering the transfusion will monitor you carefully for complications. HOME CARE INSTRUCTIONS  No special instructions are needed after a transfusion. You may find your energy is better. Speak with your caregiver about any limitations on activity for underlying diseases you may have. SEEK MEDICAL CARE IF:   Your condition is not improving after your transfusion.  You develop redness or irritation at the intravenous (IV) site. SEEK IMMEDIATE MEDICAL CARE  IF:  Any of the following symptoms occur over the next 12 hours:  Shaking chills.  You have a temperature by mouth above 102 F (38.9 C), not controlled by medicine.  Chest, back, or muscle pain.  People around you feel you are not acting correctly or are confused.  Shortness of breath or difficulty breathing.  Dizziness and fainting.  You get a rash or develop hives.  You have a decrease in urine output.  Your urine turns a dark color or changes to pink, red, or brown. Any of the following symptoms occur over the next 10 days:  You have a temperature by mouth above 102 F (38.9 C), not controlled by medicine.  Shortness of breath.  Weakness after normal activity.  The white part of the eye turns yellow (jaundice).  You have a decrease in the amount of urine or are urinating less often.  Your urine turns a dark color or changes to pink, red, or brown. Document Released: 12/30/1999 Document Revised: 03/26/2011 Document Reviewed: 08/18/2007 [facility] Patient Information 2014 [place], [place].  _______________________________________________________________________

## 2018-12-09 ENCOUNTER — Encounter (HOSPITAL_COMMUNITY): Payer: Self-pay

## 2018-12-09 ENCOUNTER — Other Ambulatory Visit: Payer: Self-pay

## 2018-12-09 ENCOUNTER — Encounter (HOSPITAL_COMMUNITY)
Admission: RE | Admit: 2018-12-09 | Discharge: 2018-12-09 | Disposition: A | Discharge status: Home / Self Care | Payer: BC Managed Care – PPO | Source: Ambulatory Visit | Attending: Obstetrics & Gynecology | Admitting: Obstetrics & Gynecology

## 2018-12-09 DIAGNOSIS — D259 Leiomyoma of uterus, unspecified: Secondary | ICD-10-CM | POA: Diagnosis not present

## 2018-12-09 DIAGNOSIS — Z01812 Encounter for preprocedural laboratory examination: Secondary | ICD-10-CM | POA: Insufficient documentation

## 2018-12-09 HISTORY — DX: Essential (primary) hypertension: I10

## 2018-12-09 LAB — BASIC METABOLIC PANEL
Anion gap: 9 (ref 5–15)
BUN: 22 mg/dL — ABNORMAL HIGH (ref 6–20)
CO2: 24 mmol/L (ref 22–32)
Calcium: 8.5 mg/dL — ABNORMAL LOW (ref 8.9–10.3)
Chloride: 103 mmol/L (ref 98–111)
Creatinine, Ser: 0.57 mg/dL (ref 0.44–1.00)
GFR calc Af Amer: >60 mL/min (ref >60)
GFR calc non Af Amer: >60 mL/min (ref >60)
Glucose, Bld: 95 mg/dL (ref 70–99)
Potassium: 4.2 mmol/L (ref 3.5–5.1)
Sodium: 136 mmol/L (ref 135–145)

## 2018-12-09 LAB — CBC
HCT: 37.6 % (ref 36.0–46.0)
Hemoglobin: 11.7 g/dL — ABNORMAL LOW (ref 12.0–15.0)
MCH: 27.9 pg (ref 26.0–34.0)
MCHC: 31.1 g/dL (ref 30.0–36.0)
MCV: 89.7 fL (ref 80.0–100.0)
Platelets: 334 10*3/uL (ref 150–400)
RBC: 4.19 MIL/uL (ref 3.87–5.11)
RDW: 13.9 % (ref 11.5–15.5)
WBC: 7 10*3/uL (ref 4.0–10.5)
nRBC: 0 % (ref 0.0–0.2)

## 2018-12-09 LAB — NO BLOOD PRODUCTS

## 2018-12-09 MED ORDER — ENSURE PRE-SURGERY PO LIQD
296.0000 mL | Freq: Once | ORAL | Status: DC
  Filled 2018-12-09: qty 296

## 2018-12-09 NOTE — Progress Notes (Addendum)
PCP - Rutherford Guys, MD Cardiologist -   Chest x-ray -  EKG -  Stress Test -  ECHO -  Cardiac Cath -   Sleep Study -  CPAP -   Fasting Blood Sugar -  Checks Blood Sugar _____ times a day  Blood Thinner Instructions: Aspirin Instructions: Last Dose:  Anesthesia review:  RN spoke with Ebony Hail , PA to inquire if patient , that normally takes her BP medication at 2:45pm , should take medication earlier to avoid missing dose day of surgery. Per Ebony Hail,  to ensure patient does not miss BP medication, patient may take BP medication 2 hours earlier the day before surgery and on the day of surgery, patient may take BP medication right before leaving her home to check in for surgery to prevent doses from being spaced too close together. Patient agreeable to this plan.    Patient denies shortness of breath, fever, cough and chest pain at PAT appointment   Patient verbalized understanding of instructions that were given to them at the PAT appointment. Patient was also instructed that they will need to review over the PAT instructions again at home before surgery.

## 2018-12-10 DIAGNOSIS — D259 Leiomyoma of uterus, unspecified: Secondary | ICD-10-CM | POA: Diagnosis not present

## 2018-12-10 DIAGNOSIS — Z01812 Encounter for preprocedural laboratory examination: Secondary | ICD-10-CM | POA: Diagnosis not present

## 2018-12-10 NOTE — Progress Notes (Signed)
Received call from Dr Jenne Pane via phone today.  Stated she had office pre-op visit with pt today and pt agreed to blood products if needed in life threatening setting.  Dr Jenne Pane would a new blood product consent done morning of surgery.

## 2018-12-15 ENCOUNTER — Other Ambulatory Visit (HOSPITAL_COMMUNITY)
Admission: RE | Admit: 2018-12-15 | Discharge: 2018-12-15 | Disposition: A | Discharge status: Home / Self Care | Payer: BC Managed Care – PPO | Source: Ambulatory Visit | Attending: Obstetrics & Gynecology | Admitting: Obstetrics & Gynecology

## 2018-12-15 DIAGNOSIS — Z20828 Contact with and (suspected) exposure to other viral communicable diseases: Secondary | ICD-10-CM | POA: Diagnosis not present

## 2018-12-15 DIAGNOSIS — Z01812 Encounter for preprocedural laboratory examination: Secondary | ICD-10-CM | POA: Insufficient documentation

## 2018-12-16 LAB — NOVEL CORONAVIRUS, NAA (HOSP ORDER, SEND-OUT TO REF LAB; TAT 18-24 HRS): SARS-CoV-2, NAA: NOT DETECTED

## 2018-12-17 NOTE — H&P (Signed)
Kara Hall is an 48 y.o. female. G3P3 presents for scheduled robotic hysterectomy and bilateral salpingectomy 2/2 menorrhagia and bulk symptoms 2/2 fibroids.  HPI:  Patient reports previously normal menses and no issues with pelvic pain/pressure. The past 6 months her period has gotten heavier with clots, lasts 3 days, saturates her clothes/sheets at night. No history of blood transfusion. In addition she has been feeling pelvic pressure, constipation, urinary frequency and dyspareunia. She had an [place] on 10/08/2018 which showed fibroids. See report below.  She has not tried medication for her bleeding but does note taking tramadol and meloxicam prn for her foot pain and that it helps her pelvic pressure and cramping with menses.   Ultrasound 10/08/2018 Uterus 10.47 x 5.3 x 6cm, bilateral ovaries normal.  EMS 10.34mm, Fibroids x 2: 1.8cm and 6.8cm (LUS subserosal)  History is significant for: TSVD x3 Pap 03/24/2018 NILM/HPV Neg. No history of abnormal pap smears History of BTL h/o thyroid cancer, sp. thyroidectomy 2014, follows with Endocrine. On synthroid.  Anxiety  HTN Epic reports DVT - per patient 2001 after her G3 pregnancy she had spider veins that were removed. She was never on anticoagulation and to her knowledge it was not a DVT Epic also notes that patient signed blood refusal form, I have spoke with patient multiple times and she will accept blood in an emergency setting if it is life threatening.     Past Medical History:  Diagnosis Date  . Anemia   . Anxiety    panic attack, also reports problems with depression, relative to her job & upcoming surgery , relative to life problems - with children & spouse    . Back complaints 2013   has had steroid injection   . Depression   . DVT (deep venous thrombosis) ([place]) 2001   RLE  . Fibroids   . Foot fracture, left   . HTN (hypertension)   . Migraine    h/o migraines - as a teenager  & again now with family issues, states she  has to lay down & rest in a dark room  (08/28/2013)  . Muscle strain of hand    wearing home made splint today, L hand  . Peripheral vascular disease ([facility])    DVT- per pt. in 2001- had vein removed.   . Refusal of blood transfusions as patient is Jehovah's Witness   . Thyroid cancer ([place])   . Thyroid nodule     Past Surgical History:  Procedure Laterality Date  . THROMBECTOMY Right 2001   leg; after 3rd child was born; for deep vein thrombosis  . THYROIDECTOMY Right 08/28/2013   Procedure: RIGHT THYROID LOBECTOMY POSSIBLE TOTAL THYROIDECTOMY;  Surgeon: Jodi Marble, MD;  Location: [facility];  Service: ENT;  Laterality: Right;  . THYROIDECTOMY Left 09/10/2013   Procedure: COMPLETE LEFT THYROIDECTOMY;  Surgeon: Jodi Marble, MD;  Location: [facility];  Service: ENT;  Laterality: Left;  . TUBAL LIGATION  2001    Family History  Problem Relation Age of Onset  . Heart disease Mother   . Hyperlipidemia Mother   . Heart disease Father   . Hyperlipidemia Father   . Diabetes Father   . Heart disease Brother   . Hyperlipidemia Brother   . Hypertension Brother   . Heart disease Brother   . Hyperlipidemia Brother     Social History:  reports that she has never smoked. She has never used smokeless tobacco. She reports current alcohol use. She reports that she does  not use drugs.  Allergies:  Allergies  Allergen Reactions  . Other     BLOOD REFUSAL   . Sulfa Antibiotics Other (See Comments) and Nausea And Vomiting    Severe bladder, and kidney infection  . Trazodone And Nefazodone Other (See Comments)    Headache every am     Medications Prior to Admission  Medication Sig Dispense Refill Last Dose  . ALPRAZolam (XANAX) 0.5 MG tablet Take 0.5-1 tablets (0.25-0.5 mg total) by mouth daily as needed for anxiety. 30 tablet 0 12/18/2018 at 0930  . amLODipine (NORVASC) 10 MG tablet Take 1 tablet (10 mg total) by mouth daily. 30 tablet 2 12/18/2018 at 0845  . levothyroxine (SYNTHROID, LEVOTHROID)  125 MCG tablet Take 125 mcg by mouth daily before breakfast.    12/18/2018 at 0630  . sertraline (ZOLOFT) 25 MG tablet Take 1 tablet (25 mg total) by mouth daily. 90 tablet 0 12/18/2018 at 0800  . traMADol (ULTRAM) 50 MG tablet Take 50 mg by mouth every 6 (six) hours as needed for moderate pain.    Past Week at Unknown time  . fluticasone (FLONASE) 50 MCG/ACT nasal spray Place 2 sprays into both nostrils daily. (Patient not taking: Reported on 11/24/2018) 16 g 12 Not Taking at Unknown time  . meloxicam (MOBIC) 15 MG tablet Take 1 tablet (15 mg total) by mouth daily. (Patient not taking: Reported on 11/24/2018) 30 tablet 0 Not Taking at Unknown time    ROS pertinent ROS per HPI  Blood pressure (!) 144/90, pulse 88, temperature 97.8 F (36.6 C), temperature source Oral, resp. rate 16, height 5\' 6"  (1.676 m), weight 86.9 kg, SpO2 100 %. Physical Exam  No acute distress  Results for orders placed or performed during the hospital encounter of 12/18/18 (from the past 24 hour(s))  Pregnancy, urine POC     Status: None   Collection Time: 12/18/18 10:23 AM  Result Value Ref Range   Preg Test, Ur NEGATIVE NEGATIVE   Assessment/Plan: Kara Hall 48 y.o. G3P3 here for planned Robotic assisted total laparoscopic hysterectomy, bilateral salpingectomy 2/2 menorrhagia and bulk symptoms due to fibroid 1. Reviewed with patient: Exam under anesthesia,  Robotic assisted total laparoscopic hysterectomy, bilateral salpingectomy, cystoscopy. Possible laparotomy. We have discussed risks/benefits of removing uterus, cervix, bilateral fallopian tubes, bilateral ovaries. After discussion, will plan to remove uterus, cervix, bilateral fallopian tubes. Remove unilateral/bilateral ovary if appears abnormal.   The nature of the procedure was discussed with the patient in detail. An informed discussion was held regarding the risks and benefits of surgical intervention. Specifically, the patient was apprised of risks of  pain, bleeding requiring blood transfusion, infection requiring antibiotics, injury to nearby organs (bowel, bladder, nerves, blood vessels, ureter), need for laparotomy to complete the operation, or failure to achieve desired results. She was informed of the low but real risk of these complications, and understands that the alternative is no surgery. An opportunity to ask questions was provided, and all questions were answered to the patient's satisfaction. Patient expresses understanding of these issues, and agrees to proceed with the plan outlined above.   -Ancef 2g preop,BMI 30, Hgb 11.7 (11/24), Covid test Neg -Reports that she will only accept blood transfusions if life threatening. -Epic reports DVT - per patient 2001 after her G3 pregnancy she had spider veins that were removed. She was never on anticoagulation and to her knowledge it was not a DVT. No need for pharm anticoagulation.  -The expected post-operative recovery course was discussed  with the patient, and post-operative instructions were reviewed. -Discussed post op prescriptions: motrin, percocet, tylenol, stool softeners.  Ramanda Paules K Taam-Akelman 12/18/2018, 11:18 AM

## 2018-12-18 ENCOUNTER — Encounter (HOSPITAL_BASED_OUTPATIENT_CLINIC_OR_DEPARTMENT_OTHER): Payer: Self-pay | Admitting: Anesthesiology

## 2018-12-18 ENCOUNTER — Other Ambulatory Visit: Payer: Self-pay

## 2018-12-18 ENCOUNTER — Encounter (HOSPITAL_BASED_OUTPATIENT_CLINIC_OR_DEPARTMENT_OTHER)
Admission: RE | Disposition: A | Discharge status: Home / Self Care | Payer: Self-pay | Source: Home / Self Care | Attending: Obstetrics & Gynecology

## 2018-12-18 ENCOUNTER — Observation Stay (HOSPITAL_BASED_OUTPATIENT_CLINIC_OR_DEPARTMENT_OTHER)
Admission: RE | Admit: 2018-12-18 | Discharge: 2018-12-19 | Disposition: A | Discharge status: Home / Self Care | Payer: BC Managed Care – PPO | Attending: Obstetrics & Gynecology | Admitting: Obstetrics & Gynecology

## 2018-12-18 ENCOUNTER — Observation Stay (HOSPITAL_BASED_OUTPATIENT_CLINIC_OR_DEPARTMENT_OTHER): Payer: BC Managed Care – PPO | Admitting: Anesthesiology

## 2018-12-18 ENCOUNTER — Observation Stay (HOSPITAL_BASED_OUTPATIENT_CLINIC_OR_DEPARTMENT_OTHER): Payer: BC Managed Care – PPO | Admitting: Physician Assistant

## 2018-12-18 DIAGNOSIS — D259 Leiomyoma of uterus, unspecified: Secondary | ICD-10-CM | POA: Diagnosis not present

## 2018-12-18 DIAGNOSIS — Z79899 Other long term (current) drug therapy: Secondary | ICD-10-CM | POA: Insufficient documentation

## 2018-12-18 DIAGNOSIS — Z79890 Hormone replacement therapy (postmenopausal): Secondary | ICD-10-CM | POA: Insufficient documentation

## 2018-12-18 DIAGNOSIS — Z85850 Personal history of malignant neoplasm of thyroid: Secondary | ICD-10-CM | POA: Insufficient documentation

## 2018-12-18 DIAGNOSIS — F418 Other specified anxiety disorders: Secondary | ICD-10-CM | POA: Diagnosis not present

## 2018-12-18 DIAGNOSIS — Z882 Allergy status to sulfonamides status: Secondary | ICD-10-CM | POA: Diagnosis not present

## 2018-12-18 DIAGNOSIS — F419 Anxiety disorder, unspecified: Secondary | ICD-10-CM | POA: Diagnosis not present

## 2018-12-18 DIAGNOSIS — Z531 Procedure and treatment not carried out because of patient's decision for reasons of belief and group pressure: Secondary | ICD-10-CM | POA: Diagnosis not present

## 2018-12-18 DIAGNOSIS — F329 Major depressive disorder, single episode, unspecified: Secondary | ICD-10-CM | POA: Diagnosis not present

## 2018-12-18 DIAGNOSIS — D251 Intramural leiomyoma of uterus: Secondary | ICD-10-CM | POA: Diagnosis not present

## 2018-12-18 DIAGNOSIS — Z90710 Acquired absence of both cervix and uterus: Secondary | ICD-10-CM | POA: Diagnosis present

## 2018-12-18 DIAGNOSIS — N838 Other noninflammatory disorders of ovary, fallopian tube and broad ligament: Secondary | ICD-10-CM | POA: Diagnosis not present

## 2018-12-18 DIAGNOSIS — Z20828 Contact with and (suspected) exposure to other viral communicable diseases: Secondary | ICD-10-CM | POA: Diagnosis not present

## 2018-12-18 DIAGNOSIS — Z888 Allergy status to other drugs, medicaments and biological substances status: Secondary | ICD-10-CM | POA: Diagnosis not present

## 2018-12-18 DIAGNOSIS — I10 Essential (primary) hypertension: Secondary | ICD-10-CM | POA: Diagnosis not present

## 2018-12-18 DIAGNOSIS — N920 Excessive and frequent menstruation with regular cycle: Secondary | ICD-10-CM | POA: Insufficient documentation

## 2018-12-18 HISTORY — PX: ROBOTIC ASSISTED LAPAROSCOPIC HYSTERECTOMY AND SALPINGECTOMY: SHX6379

## 2018-12-18 HISTORY — PX: LAPAROSCOPIC LYSIS OF ADHESIONS: SHX5905

## 2018-12-18 HISTORY — PX: CYSTOSCOPY: SHX5120

## 2018-12-18 LAB — CBC
HCT: 38.4 % (ref 36.0–46.0)
Hemoglobin: 11.9 g/dL — ABNORMAL LOW (ref 12.0–15.0)
MCH: 27.7 pg (ref 26.0–34.0)
MCHC: 31 g/dL (ref 30.0–36.0)
MCV: 89.3 fL (ref 80.0–100.0)
Platelets: 342 10*3/uL (ref 150–400)
RBC: 4.3 MIL/uL (ref 3.87–5.11)
RDW: 13.9 % (ref 11.5–15.5)
WBC: 7.2 10*3/uL (ref 4.0–10.5)
nRBC: 0 % (ref 0.0–0.2)

## 2018-12-18 LAB — POCT PREGNANCY, URINE: Preg Test, Ur: NEGATIVE

## 2018-12-18 SURGERY — XI ROBOTIC ASSISTED LAPAROSCOPIC HYSTERECTOMY AND SALPINGECTOMY
Anesthesia: General | Site: Bladder

## 2018-12-18 MED ORDER — GABAPENTIN 100 MG PO CAPS
ORAL_CAPSULE | ORAL | Status: AC
  Filled 2018-12-18: qty 1

## 2018-12-18 MED ORDER — MEPERIDINE HCL 25 MG/ML IJ SOLN
6.2500 mg | INTRAMUSCULAR | Status: DC | PRN
  Filled 2018-12-18: qty 1

## 2018-12-18 MED ORDER — IBUPROFEN 800 MG PO TABS
800.0000 mg | ORAL_TABLET | Freq: Three times a day (TID) | ORAL | Status: DC
  Administered 2018-12-18 – 2018-12-19 (×2): 800 mg via ORAL
  Filled 2018-12-18: qty 1

## 2018-12-18 MED ORDER — GABAPENTIN 100 MG PO CAPS
100.0000 mg | ORAL_CAPSULE | Freq: Three times a day (TID) | ORAL | Status: DC
  Administered 2018-12-18 – 2018-12-19 (×3): 100 mg via ORAL
  Filled 2018-12-18: qty 1

## 2018-12-18 MED ORDER — FENTANYL CITRATE (PF) 250 MCG/5ML IJ SOLN
INTRAMUSCULAR | Status: AC
  Filled 2018-12-18: qty 5

## 2018-12-18 MED ORDER — GABAPENTIN 300 MG PO CAPS
ORAL_CAPSULE | ORAL | Status: AC
  Filled 2018-12-18: qty 1

## 2018-12-18 MED ORDER — PHENYLEPHRINE 40 MCG/ML (10ML) SYRINGE FOR IV PUSH (FOR BLOOD PRESSURE SUPPORT)
PREFILLED_SYRINGE | INTRAVENOUS | Status: AC
  Filled 2018-12-18: qty 10

## 2018-12-18 MED ORDER — HYDROMORPHONE HCL 1 MG/ML IJ SOLN
0.2000 mg | INTRAMUSCULAR | Status: DC | PRN
  Filled 2018-12-18: qty 1

## 2018-12-18 MED ORDER — PHENYLEPHRINE 40 MCG/ML (10ML) SYRINGE FOR IV PUSH (FOR BLOOD PRESSURE SUPPORT)
PREFILLED_SYRINGE | INTRAVENOUS | Status: AC
  Filled 2018-12-18: qty 20

## 2018-12-18 MED ORDER — LIDOCAINE 2% (20 MG/ML) 5 ML SYRINGE
INTRAMUSCULAR | Status: DC | PRN
  Administered 2018-12-18: 30 mg via INTRAVENOUS

## 2018-12-18 MED ORDER — FENTANYL CITRATE (PF) 100 MCG/2ML IJ SOLN
INTRAMUSCULAR | Status: AC
  Filled 2018-12-18: qty 2

## 2018-12-18 MED ORDER — SODIUM CHLORIDE (PF) 0.9 % IJ SOLN
INTRAMUSCULAR | Status: AC
  Filled 2018-12-18: qty 50

## 2018-12-18 MED ORDER — OXYCODONE-ACETAMINOPHEN 5-325 MG PO TABS: 1.0000 | ORAL_TABLET | Freq: Four times a day (QID) | ORAL | 0 refills | Status: DC | PRN

## 2018-12-18 MED ORDER — ACETAMINOPHEN 500 MG PO TABS
ORAL_TABLET | ORAL | Status: AC
  Filled 2018-12-18: qty 2

## 2018-12-18 MED ORDER — SODIUM CHLORIDE 0.9 % IV SOLN
INTRAVENOUS | Status: AC
  Filled 2018-12-18: qty 2

## 2018-12-18 MED ORDER — ONDANSETRON HCL 4 MG/2ML IJ SOLN
4.0000 mg | Freq: Four times a day (QID) | INTRAMUSCULAR | Status: DC | PRN
  Filled 2018-12-18: qty 2

## 2018-12-18 MED ORDER — DOCUSATE SODIUM 50 MG PO CAPS: 50.0000 mg | ORAL_CAPSULE | Freq: Two times a day (BID) | ORAL | 1 refills | Status: DC

## 2018-12-18 MED ORDER — LACTATED RINGERS IV SOLN
INTRAVENOUS | Status: DC
  Administered 2018-12-18: 22:00:00 via INTRAVENOUS
  Filled 2018-12-18: qty 1000

## 2018-12-18 MED ORDER — MIDAZOLAM HCL 2 MG/2ML IJ SOLN
INTRAMUSCULAR | Status: DC | PRN
  Administered 2018-12-18: 2 mg via INTRAVENOUS

## 2018-12-18 MED ORDER — SIMETHICONE 80 MG PO CHEW: 80.0000 mg | CHEWABLE_TABLET | Freq: Four times a day (QID) | ORAL | 1 refills | Status: DC | PRN

## 2018-12-18 MED ORDER — LIDOCAINE 2% (20 MG/ML) 5 ML SYRINGE
INTRAMUSCULAR | Status: AC
  Filled 2018-12-18: qty 5

## 2018-12-18 MED ORDER — SODIUM CHLORIDE 0.9 % IV SOLN
2.0000 g | INTRAVENOUS | Status: AC
  Administered 2018-12-18: 2 g via INTRAVENOUS
  Filled 2018-12-18: qty 2

## 2018-12-18 MED ORDER — ONDANSETRON HCL 4 MG/2ML IJ SOLN
INTRAMUSCULAR | Status: AC
  Filled 2018-12-18: qty 2

## 2018-12-18 MED ORDER — IBUPROFEN 800 MG PO TABS
ORAL_TABLET | ORAL | Status: AC
  Filled 2018-12-18: qty 1

## 2018-12-18 MED ORDER — MIDAZOLAM HCL 2 MG/2ML IJ SOLN
0.5000 mg | Freq: Once | INTRAMUSCULAR | Status: DC | PRN
  Filled 2018-12-18: qty 2

## 2018-12-18 MED ORDER — PHENYLEPHRINE HCL (PRESSORS) 10 MG/ML IV SOLN
INTRAVENOUS | Status: DC | PRN
  Administered 2018-12-18 (×6): 80 ug via INTRAVENOUS
  Administered 2018-12-18: 120 ug via INTRAVENOUS
  Administered 2018-12-18: 40 ug via INTRAVENOUS

## 2018-12-18 MED ORDER — FENTANYL CITRATE (PF) 100 MCG/2ML IJ SOLN
INTRAMUSCULAR | Status: DC | PRN
  Administered 2018-12-18 (×2): 50 ug via INTRAVENOUS
  Administered 2018-12-18: 150 ug via INTRAVENOUS
  Administered 2018-12-18 (×2): 50 ug via INTRAVENOUS

## 2018-12-18 MED ORDER — BUPIVACAINE HCL (PF) 0.25 % IJ SOLN
INTRAMUSCULAR | Status: AC
  Filled 2018-12-18: qty 30

## 2018-12-18 MED ORDER — ACETAMINOPHEN 500 MG PO TABS
1000.0000 mg | ORAL_TABLET | ORAL | Status: AC
  Administered 2018-12-18: 11:00:00 1000 mg via ORAL
  Filled 2018-12-18: qty 2

## 2018-12-18 MED ORDER — DEXAMETHASONE SODIUM PHOSPHATE 10 MG/ML IJ SOLN
INTRAMUSCULAR | Status: DC | PRN
  Administered 2018-12-18: 5 mg via INTRAVENOUS

## 2018-12-18 MED ORDER — DOCUSATE SODIUM 100 MG PO CAPS
ORAL_CAPSULE | ORAL | Status: AC
  Filled 2018-12-18: qty 1

## 2018-12-18 MED ORDER — LACTATED RINGERS IV SOLN
INTRAVENOUS | Status: DC
  Administered 2018-12-18: 15:00:00 via INTRAVENOUS
  Administered 2018-12-18: 11:00:00 100 mL/h via INTRAVENOUS
  Filled 2018-12-18: qty 1000

## 2018-12-18 MED ORDER — ONDANSETRON HCL 4 MG PO TABS
4.0000 mg | ORAL_TABLET | Freq: Four times a day (QID) | ORAL | Status: DC | PRN
  Filled 2018-12-18: qty 1

## 2018-12-18 MED ORDER — PROPOFOL 10 MG/ML IV BOLUS
INTRAVENOUS | Status: AC
  Filled 2018-12-18: qty 20

## 2018-12-18 MED ORDER — PROPOFOL 10 MG/ML IV BOLUS
INTRAVENOUS | Status: DC | PRN
  Administered 2018-12-18: 50 mg via INTRAVENOUS
  Administered 2018-12-18: 150 mg via INTRAVENOUS

## 2018-12-18 MED ORDER — STERILE WATER FOR IRRIGATION IR SOLN
Status: DC | PRN
  Administered 2018-12-18: 1000 mL

## 2018-12-18 MED ORDER — ROCURONIUM BROMIDE 10 MG/ML (PF) SYRINGE
PREFILLED_SYRINGE | INTRAVENOUS | Status: DC | PRN
  Administered 2018-12-18: 60 mg via INTRAVENOUS
  Administered 2018-12-18 (×2): 20 mg via INTRAVENOUS

## 2018-12-18 MED ORDER — OXYCODONE-ACETAMINOPHEN 5-325 MG PO TABS
1.0000 | ORAL_TABLET | ORAL | Status: DC | PRN
  Administered 2018-12-18 – 2018-12-19 (×4): 2 via ORAL
  Filled 2018-12-18: qty 2

## 2018-12-18 MED ORDER — SODIUM CHLORIDE (PF) 0.9 % IJ SOLN
INTRAMUSCULAR | Status: DC | PRN
  Administered 2018-12-18: 10 mL

## 2018-12-18 MED ORDER — DEXAMETHASONE SODIUM PHOSPHATE 10 MG/ML IJ SOLN
INTRAMUSCULAR | Status: AC
  Filled 2018-12-18: qty 1

## 2018-12-18 MED ORDER — GABAPENTIN 300 MG PO CAPS
300.0000 mg | ORAL_CAPSULE | ORAL | Status: AC
  Administered 2018-12-18: 11:00:00 300 mg via ORAL
  Filled 2018-12-18: qty 1

## 2018-12-18 MED ORDER — SCOPOLAMINE 1 MG/3DAYS TD PT72
1.0000 | MEDICATED_PATCH | TRANSDERMAL | Status: DC
  Administered 2018-12-18: 1.5 mg via TRANSDERMAL
  Filled 2018-12-18: qty 1

## 2018-12-18 MED ORDER — ROPIVACAINE HCL 5 MG/ML IJ SOLN
INTRAMUSCULAR | Status: AC
  Filled 2018-12-18: qty 30

## 2018-12-18 MED ORDER — SUGAMMADEX SODIUM 200 MG/2ML IV SOLN
INTRAVENOUS | Status: DC | PRN
  Administered 2018-12-18: 200 mg via INTRAVENOUS

## 2018-12-18 MED ORDER — OXYCODONE-ACETAMINOPHEN 5-325 MG PO TABS
ORAL_TABLET | ORAL | Status: AC
  Filled 2018-12-18: qty 2

## 2018-12-18 MED ORDER — KETOROLAC TROMETHAMINE 30 MG/ML IJ SOLN
INTRAMUSCULAR | Status: AC
  Filled 2018-12-18: qty 1

## 2018-12-18 MED ORDER — IBUPROFEN 800 MG PO TABS: 800.0000 mg | ORAL_TABLET | Freq: Three times a day (TID) | ORAL | 1 refills | Status: DC | PRN

## 2018-12-18 MED ORDER — DOCUSATE SODIUM 100 MG PO CAPS
100.0000 mg | ORAL_CAPSULE | Freq: Two times a day (BID) | ORAL | Status: DC
  Administered 2018-12-18 (×2): 100 mg via ORAL
  Filled 2018-12-18: qty 1

## 2018-12-18 MED ORDER — HYDROMORPHONE HCL 1 MG/ML IJ SOLN
INTRAMUSCULAR | Status: AC
  Filled 2018-12-18: qty 1

## 2018-12-18 MED ORDER — SCOPOLAMINE 1 MG/3DAYS TD PT72
MEDICATED_PATCH | TRANSDERMAL | Status: AC
  Filled 2018-12-18: qty 1

## 2018-12-18 MED ORDER — MIDAZOLAM HCL 2 MG/2ML IJ SOLN
INTRAMUSCULAR | Status: AC
  Filled 2018-12-18: qty 2

## 2018-12-18 MED ORDER — KETOROLAC TROMETHAMINE 30 MG/ML IJ SOLN
INTRAMUSCULAR | Status: DC | PRN
  Administered 2018-12-18: 30 mg via INTRAVENOUS

## 2018-12-18 MED ORDER — HYDROMORPHONE HCL 1 MG/ML IJ SOLN
0.2500 mg | INTRAMUSCULAR | Status: DC | PRN
  Administered 2018-12-18 (×2): 0.25 mg via INTRAVENOUS
  Filled 2018-12-18: qty 0.5

## 2018-12-18 MED ORDER — ONDANSETRON HCL 4 MG/2ML IJ SOLN
INTRAMUSCULAR | Status: DC | PRN
  Administered 2018-12-18: 4 mg via INTRAVENOUS

## 2018-12-18 MED ORDER — SODIUM CHLORIDE 0.9 % IV SOLN
INTRAVENOUS | Status: DC | PRN
  Administered 2018-12-18: 60 mL

## 2018-12-18 MED ORDER — SODIUM CHLORIDE 0.9 % IR SOLN
Status: DC | PRN
  Administered 2018-12-18: 3000 mL

## 2018-12-18 MED ORDER — ARTIFICIAL TEARS OPHTHALMIC OINT
TOPICAL_OINTMENT | OPHTHALMIC | Status: AC
  Filled 2018-12-18: qty 3.5

## 2018-12-18 MED ORDER — SIMETHICONE 80 MG PO CHEW
80.0000 mg | CHEWABLE_TABLET | Freq: Four times a day (QID) | ORAL | Status: DC | PRN
  Filled 2018-12-18: qty 1

## 2018-12-18 MED ORDER — KETAMINE HCL 10 MG/ML IJ SOLN
INTRAMUSCULAR | Status: AC
  Filled 2018-12-18: qty 1

## 2018-12-18 MED ORDER — KETAMINE HCL 10 MG/ML IJ SOLN
INTRAMUSCULAR | Status: DC | PRN
  Administered 2018-12-18: 25 mg via INTRAVENOUS
  Administered 2018-12-18: 15 mg via INTRAVENOUS

## 2018-12-18 SURGICAL SUPPLY — 54 items
BARRIER ADHS 3X4 INTERCEED (GAUZE/BANDAGES/DRESSINGS) IMPLANT
CANISTER SUCT 3000ML PPV (MISCELLANEOUS) ×4 IMPLANT
COVER BACK TABLE 60X90IN (DRAPES) ×4 IMPLANT
COVER TIP SHEARS 8 DVNC (MISCELLANEOUS) ×3 IMPLANT
COVER TIP SHEARS 8MM DA VINCI (MISCELLANEOUS) ×1
DECANTER SPIKE VIAL GLASS SM (MISCELLANEOUS) ×4 IMPLANT
DEFOGGER SCOPE WARMER CLEARIFY (MISCELLANEOUS) ×4 IMPLANT
DERMABOND ADVANCED (GAUZE/BANDAGES/DRESSINGS) ×1
DERMABOND ADVANCED .7 DNX12 (GAUZE/BANDAGES/DRESSINGS) ×3 IMPLANT
DRAPE ARM DVNC X/XI (DISPOSABLE) ×12 IMPLANT
DRAPE COLUMN DVNC XI (DISPOSABLE) ×3 IMPLANT
DRAPE DA VINCI XI ARM (DISPOSABLE) ×4
DRAPE DA VINCI XI COLUMN (DISPOSABLE) ×1
DRSG TEGADERM 4X4.75 (GAUZE/BANDAGES/DRESSINGS) ×4 IMPLANT
DURAPREP 26ML APPLICATOR (WOUND CARE) ×4 IMPLANT
ELECT REM PT RETURN 9FT ADLT (ELECTROSURGICAL) ×4
ELECTRODE REM PT RTRN 9FT ADLT (ELECTROSURGICAL) ×3 IMPLANT
GLOVE BIO SURGEON STRL SZ 6 (GLOVE) ×16 IMPLANT
GLOVE BIO SURGEON STRL SZ7 (GLOVE) ×12 IMPLANT
GLOVE BIOGEL PI IND STRL 6 (GLOVE) ×9 IMPLANT
GLOVE BIOGEL PI IND STRL 7.0 (GLOVE) ×6 IMPLANT
GLOVE BIOGEL PI INDICATOR 6 (GLOVE) ×3
GLOVE BIOGEL PI INDICATOR 7.0 (GLOVE) ×2
IRRIG SUCT STRYKERFLOW 2 WTIP (MISCELLANEOUS) ×4
IRRIGATION SUCT STRKRFLW 2 WTP (MISCELLANEOUS) ×3 IMPLANT
LEGGING LITHOTOMY PAIR STRL (DRAPES) ×4 IMPLANT
MANIPULATOR ADVINCU DEL 2.5 PL (MISCELLANEOUS) IMPLANT
MANIPULATOR ADVINCU DEL 3.0 PL (MISCELLANEOUS) IMPLANT
MANIPULATOR ADVINCU DEL 3.5 PL (MISCELLANEOUS) IMPLANT
MANIPULATOR ADVINCU DEL 4.0 PL (MISCELLANEOUS) ×4 IMPLANT
OBTURATOR OPTICAL STANDARD 8MM (TROCAR) ×1
OBTURATOR OPTICAL STND 8 DVNC (TROCAR) ×3
OBTURATOR OPTICALSTD 8 DVNC (TROCAR) ×3 IMPLANT
PACK ROBOT WH (CUSTOM PROCEDURE TRAY) ×4 IMPLANT
PACK ROBOTIC GOWN (GOWN DISPOSABLE) ×4 IMPLANT
PACK TRENDGUARD 450 HYBRID PRO (MISCELLANEOUS) ×3 IMPLANT
PAD PREP 24X48 CUFFED NSTRL (MISCELLANEOUS) ×4 IMPLANT
PROTECTOR NERVE ULNAR (MISCELLANEOUS) ×8 IMPLANT
SCISSORS LAP 5X35 DISP (ENDOMECHANICALS) ×4 IMPLANT
SEAL CANN UNIV 5-8 DVNC XI (MISCELLANEOUS) ×9 IMPLANT
SEAL XI 5MM-8MM UNIVERSAL (MISCELLANEOUS) ×3
SET IRRIG Y TYPE TUR BLADDER L (SET/KITS/TRAYS/PACK) ×4 IMPLANT
SET TRI-LUMEN FLTR TB AIRSEAL (TUBING) ×4 IMPLANT
SUT VIC AB 0 CT1 36 (SUTURE) ×4 IMPLANT
SUT VICRYL RAPIDE 3 0 (SUTURE) ×8 IMPLANT
SUT VLOC 180 0 9IN  GS21 (SUTURE) ×1
SUT VLOC 180 0 9IN GS21 (SUTURE) ×3 IMPLANT
TOWEL OR 17X26 10 PK STRL BLUE (TOWEL DISPOSABLE) ×8 IMPLANT
TRAY FOLEY W/BAG SLVR 14FR LF (SET/KITS/TRAYS/PACK) ×4 IMPLANT
TRENDGUARD 450 HYBRID PRO PACK (MISCELLANEOUS) ×4
TROCAR BLADELESS OPT 5 100 (ENDOMECHANICALS) ×4 IMPLANT
TROCAR BLADELESS OPT 5 75 (ENDOMECHANICALS) ×4 IMPLANT
TROCAR PORT AIRSEAL 5X120 (TROCAR) ×4 IMPLANT
WATER STERILE IRR 1000ML POUR (IV SOLUTION) ×4 IMPLANT

## 2018-12-18 NOTE — Anesthesia Preprocedure Evaluation (Addendum)
Anesthesia Evaluation  Patient identified by MRN, date of birth, ID band Patient awake    Reviewed: Allergy & Precautions, NPO status , Patient's Chart, lab work & pertinent test results  History of Anesthesia Complications Negative for: history of anesthetic complications  Airway Mallampati: II  TM Distance: >3 FB Neck ROM: Full    Dental  (+) Dental Advisory Given, Teeth Intact   Pulmonary  12/15/2018 SARS coronavirus NEG   breath sounds clear to auscultation       Cardiovascular hypertension, Pt. on medications (-) angina Rhythm:Regular Rate:Normal     Neuro/Psych  Headaches, Anxiety Depression    GI/Hepatic Neg liver ROS, GERD  Poorly Controlled,  Endo/Other  negative endocrine ROS  Renal/GU negative Renal ROS     Musculoskeletal   Abdominal   Peds  Hematology negative hematology ROS (+)   Anesthesia Other Findings   Reproductive/Obstetrics                           Anesthesia Physical Anesthesia Plan  ASA: II  Anesthesia Plan: General   Post-op Pain Management:    Induction: Intravenous  PONV Risk Score and Plan: 4 or greater and Scopolamine patch - Pre-op, Dexamethasone and Ondansetron  Airway Management Planned: Oral ETT  Additional Equipment: None  Intra-op Plan:   Post-operative Plan: Extubation in OR  Informed Consent: I have reviewed the patients History and Physical, chart, labs and discussed the procedure including the risks, benefits and alternatives for the proposed anesthesia with the patient or authorized representative who has indicated his/her understanding and acceptance.     Dental advisory given  Plan Discussed with: CRNA and Surgeon  Anesthesia Plan Comments:         Anesthesia Quick Evaluation

## 2018-12-18 NOTE — Op Note (Signed)
PATIENT:  Kara Hall  48 y.o. female  PRE-OPERATIVE DIAGNOSIS:  fibroids  POST-OPERATIVE DIAGNOSIS:  fibroids  PROCEDURE:  Procedure(s): XI ROBOTIC ASSISTED LAPAROSCOPIC HYSTERECTOMY AND BILATERAL SALPINGECTOMY (Bilateral) LAPAROSCOPIC LYSIS OF ADHESIONS (N/A) CYSTOSCOPY (N/A)  SURGEON:  Surgeon(s) and Role:    * Taam-Akelman, Lawrence Santiago, MD - Primary    * Bobbye Charleston, MD - Assisting  ANESTHESIA:   general  EBL: 25cc  BLOOD ADMINISTERED:none  DRAINS: none   LOCAL MEDICATIONS USED: Ropivicaine   SPECIMEN: bilateral fallopian tubes, uterus, cervix. Uterus weighing 303g  DISPOSITION OF SPECIMEN:  PATHOLOGY  COUNTS:  YES  TOURNIQUET:  * No tourniquets in log *  PLAN OF CARE: Discharge to home after PACU  PATIENT DISPOSITION:  PACU - hemodynamically stable.   Delay start of Pharmacological VTE agent (>24hrs) due to surgical blood loss or risk of bleeding: not applicable  Complications: None  Findings: Exam under anesthesia revealed 12 week size mobile uterus with fibroid in posterior lower uterine segment. Omental adhesions to anterior abdomen taken down sharply. Normal ovaries bilaterally each with a simple cyst. Normal fallopian tubes bilaterally with evidence of prior bilateral tubal ligation. Uterus with large posterior lower uterine segment fibroid, small fundal fibroid and small anterior fibroid. On cystoscopy the bladder was intact and bilateral spill was seen from each ureteral orifcace.  Description of procedure:  After consent was verified the patient was taken to the operating room.  After adequate anesthesia was achieved the patient was positioned prepped and draped in the usual sterile fashion.  Exam revealed a 12 week size mobile uterus. A speculum was placed in the vagina and the uterus was sounded to 10cm and the cervix dilated with Kennon Rounds dilators.  The 4cm KOH ring at the insula was assembled and placed in the proper fashion.  The  speculum was removed and the bladder was drained with a Foley catheter.  Attention was turned to the abdomen where a 1 cm incision was made at the umbilicus.  The 8.14mm robotic trochar was used for direct entry, and the abdomen was insufflated.  The 8.59mm robotic trochars were placed 10 cm from the midline incision.  A 5 mm assistant port was placed 3 cm above the left robotic trocar.  All trochars were inserted under direct visualization of the camera. An omental adhesion was noted to the anterior abdomen at the level of the trochar but not at the trochars. This adhesion was taken down sharply using laparoscopic scissors. Hemostasis was noted. The patient was placed in Trendelenburg and the robot was docked.  The fenestrated bipolar was placed on arm 1 and the monopolar scissors on arm 3 and introduced under direct visualization of the camera.  Survey of the abdomen revealed: Normal ovaries bilaterally, each with a simple cyst. Normal fallopian tubes bilaterally with evidence of prior bilateral tubal ligation. Uterus with large posterior lower uterine segment fibroid, small fundal fibroid and small anterior fibroid. Ureters identified multiple points of the case and were out of the filed of dissection. I then broke scrub and sat on the console.   The left side wall had adhesions to the bowel which was taken down sharply. The left fallopian tube was isolated and cauterized with the bipolar.  The round ligament was divided with the bipolar cautery and shears.  The utero-ovarian ligament was then divided with the bipolar cautery and shears.  The posterior broad ligament was then divided with the monopolar scissors to the level of the uterosacral ligament.  The  anterior leaf of the broad ligament was developed pass the KOH ring for the bladder flap. The uterine artery was cuaterized  The right tube was then cauterized with bipolar and divided with shears followed by the round ligament and the utero-ovarian  ligament both divided with the bipolar forceps and scissors.  The posterior leaf was the divided to the level of the utero sacral ligament. The anterior leaf was developed past the KOH ring for the bladder flap. The right uterine artery was cauterized.  The ureters were identified well out of the field of dissection. Both uterine arteries were then ligated.   The bladder was retracted inferiorly and the vesicouterine fascia was incised in the midline into the bladder was removed 1 cm below the KOH ring.  The monopolar scissors were then used for the colpotomy and incised the vagina at the level of the KOH ring circumferentially.  After the uterus and cervix were amputated the cuff was inspected and noted to be hemostatic.  The uterus was removed through the vagina.  The scissors were exchanged for the Mega suture cut needle driver and the cuff was closed with 0 Vicryl V-Loc in a running fashion. The cuff and pedicles were hemostatic. Irrigation was performed. The ureters were peristalsing bilaterally and lateral to the pedicles. Pressure was reduced and hemostasis was noted again. Ropivicaine was introduced in the pelvis.  The robot was undocked and I scrubbed back in. The skin incisions were closed with 3-0 vicryl rapide in a subcuticular fashion. Dermabond was placed. All instruments were removed from the vagina and the vagina was intact on bimanual exam. Cystoscopy was performed revealing an intact bladder and bilateral spill from the ureters. The cystoscopy was removed. Foley replaced. The patient taken to the recovery room in a stable condition.   Vasiliki Smaldone K Taam-Akelman 12/18/18 4:17 PM

## 2018-12-18 NOTE — Discharge Instructions (Signed)
Robotic Total Laparoscopic Hysterectomy, Care After This sheet gives you information about how to care for yourself after your procedure. Your health care provider may also give you more specific instructions. If you have problems or questions, contact your health care provider. What can I expect after the procedure? After the procedure, it is common to have:  Pain and bruising around your incisions.  A sore throat, if a breathing tube was used during surgery.  Fatigue.  Poor appetite.  Less interest in sex. Follow these instructions at home: Bathing  Do not take baths, swim, or use a hot tub until your health care provider approves. You may need to only take showers for 2-3 weeks.  Keep your bandage (dressing) dry until your health care provider says it can be removed. Incision care   Follow instructions from your health care provider about how to take care of your incisions. Make sure you: ? Wash your hands with soap and water before you change your dressing. If soap and water are not available, use hand sanitizer. ? Change your dressing as told by your health care provider. ? Leave stitches (sutures), skin glue, or adhesive strips in place. These skin closures may need to stay in place for 2 weeks or longer. If adhesive strip edges start to loosen and curl up, you may trim the loose edges. Do not remove adhesive strips completely unless your health care provider tells you to do that.  Check your incision area every day for signs of infection. Check for: ? Redness, swelling, or pain. ? Fluid or blood. ? Warmth. ? Pus or a bad smell. Activity  Get plenty of rest and sleep.  Do not lift anything that is heavier than 10 lbs (4.5 kg) for one month after surgery, or as long as told by your health care provider.  Do not drive or use heavy machinery while taking prescription pain medicine.  Do not drive for 24 hours if you were given a medicine to help you relax  (sedative).  Return to your normal activities as told by your health care provider. Ask your health care provider what activities are safe for you. Lifestyle   Do not use any products that contain nicotine or tobacco, such as cigarettes and e-cigarettes. These can delay healing. If you need help quitting, ask your health care provider.  Do not drink alcohol until your health care provider approves. General instructions  Do not douche, use tampons, or have sex for at least 6 weeks, or as told by your health care provider.  Take over-the-counter and prescription medicines only as told by your health care provider.  To monitor yourself for a fever, take your temperature at least once a day during recovery.  If you struggle with physical or emotional changes after your procedure, speak with your health care provider or a therapist.  To prevent or treat constipation while you are taking prescription pain medicine, your health care provider may recommend that you: ? Drink enough fluid to keep your urine clear or pale yellow. ? Take over-the-counter or prescription medicines. ? Eat foods that are high in fiber, such as fresh fruits and vegetables, whole grains, and beans. ? Limit foods that are high in fat and processed sugars, such as fried and sweet foods.  Keep all follow-up visits as told by your health care provider. This is important. Contact a health care provider if:  You have chills or a fever.  You have redness, swelling, or pain around an  incision.  You have fluid or blood coming from an incision.  Your incision feels warm to the touch.  You have pus or a bad smell coming from an incision.  An incision breaks open.  You feel dizzy or light-headed.  You have pain or bleeding when you urinate.  You have diarrhea, nausea, or vomiting that does not go away.  You have abnormal vaginal discharge.  You have a rash.  You have pain that does not get better with  medicine. Get help right away if:  You have a fever and your symptoms suddenly get worse.  You have severe abdominal pain.  You have chest pain.  You have shortness of breath.  You faint.  You have pain, swelling, or redness on your leg.  You have heavy vaginal bleeding with blood clots.  Prescriptions Motrin 800mg  every 8 hours for pain Acetaminophen 650mg  every 6 hours for moderate pain Percocet (Oxycodone 5mg -acetaminophen 325mg ) every 8 hours for severe pain.  Make sure to not exceed acetaminophen 3000mg  every day. Colace: stool softener to take everyday to make sure you are not constipated Gas-x for gas pains  Summary  After the procedure it is common to have abdominal pain. Your provider will give you medication for this.  Do not take baths, swim, or use a hot tub until your health care provider approves.  Do not lift anything that is heavier than 10 lbs (4.5 kg) for one month after surgery, or as long as told by your health care provider.  Notify your provider if you have any signs or symptoms of infection after the procedure. This information is not intended to replace advice given to you by your health care provider. Make sure you discuss any questions you have with your health care provider. Document Released: 10/22/2012 Document Revised: 12/14/2016 Document Reviewed: 03/14/2016 Elsevier Patient Education  2020 [employer].

## 2018-12-18 NOTE — Brief Op Note (Signed)
12/18/2018  3:50 PM  PATIENT:  Kara Hall  48 y.o. female  PRE-OPERATIVE DIAGNOSIS:  fibroids  POST-OPERATIVE DIAGNOSIS:  fibroids  PROCEDURE:  Procedure(s): XI ROBOTIC ASSISTED LAPAROSCOPIC HYSTERECTOMY AND BILATERAL SALPINGECTOMY (Bilateral) LAPAROSCOPIC LYSIS OF ADHESIONS (N/A) CYSTOSCOPY (N/A)  SURGEON:  Surgeon(s) and Role:    * Taam-Akelman, Lawrence Santiago, MD - Primary    * Bobbye Charleston, MD - Assisting  ANESTHESIA:   general  EBL: 25cc  BLOOD ADMINISTERED:none  DRAINS: none   LOCAL MEDICATIONS USED: Ropivicaine   SPECIMEN: bilateral fallopian tubes, uterus, cervix  DISPOSITION OF SPECIMEN:  PATHOLOGY  COUNTS:  YES  TOURNIQUET:  * No tourniquets in log *  DICTATION: .Note written in EPIC  PLAN OF CARE: Discharge to home after PACU  PATIENT DISPOSITION:  PACU - hemodynamically stable.   Delay start of Pharmacological VTE agent (>24hrs) due to surgical blood loss or risk of bleeding: not applicable

## 2018-12-18 NOTE — Anesthesia Procedure Notes (Signed)
Procedure Name: Intubation Date/Time: 12/18/2018 12:05 PM Performed by: Wanita Chamberlain, CRNA Pre-anesthesia Checklist: Patient identified, Emergency Drugs available, Suction available and Patient being monitored Patient Re-evaluated:Patient Re-evaluated prior to induction Oxygen Delivery Method: Circle system utilized Preoxygenation: Pre-oxygenation with 100% oxygen Induction Type: IV induction Ventilation: Mask ventilation without difficulty Laryngoscope Size: Mac and 3 Grade View: Grade II Tube type: Oral Tube size: 7.0 mm Number of attempts: 2 Airway Equipment and Method: Stylet Placement Confirmation: ETT inserted through vocal cords under direct vision,  positive ETCO2,  CO2 detector and breath sounds checked- equal and bilateral Secured at: 21 cm Tube secured with: Tape Dental Injury: Teeth and Oropharynx as per pre-operative assessment

## 2018-12-18 NOTE — Transfer of Care (Signed)
Immediate Anesthesia Transfer of Care Note  Patient: Kara Hall  Procedure(s) Performed: XI ROBOTIC ASSISTED LAPAROSCOPIC HYSTERECTOMY AND BILATERAL SALPINGECTOMY (Bilateral Abdomen) LAPAROSCOPIC LYSIS OF ADHESIONS (N/A Abdomen) CYSTOSCOPY (N/A Bladder)  Patient Location: PACU  Anesthesia Type:General  Level of Consciousness: awake, alert , oriented and patient cooperative  Airway & Oxygen Therapy: Patient Spontanous Breathing and Patient connected to nasal cannula oxygen  Post-op Assessment: Report given to RN and Post -op Vital signs reviewed and stable  Post vital signs: Reviewed and stable  Last Vitals:  Vitals Value Taken Time  BP    Temp    Pulse 95 12/18/18 1600  Resp 14 12/18/18 1600  SpO2 92 % 12/18/18 1600  Vitals shown include unvalidated device data.  Last Pain:  Vitals:   12/18/18 1115  TempSrc: Oral  PainSc: 0-No pain      Patients Stated Pain Goal: 4 ([id number])  Complications: No apparent anesthesia complications

## 2018-12-18 NOTE — Discharge Summary (Addendum)
Physician Discharge Summary  Patient ID: Kara Hall MRN: [id number] DOB/AGE: 05/03/1970 48 y.o.  Admit date: 12/18/2018 Discharge date: 12/19/2018  Admission Diagnoses: Fibroid, menorrhagia, pelvic pain  Discharge Diagnoses:  Active Problems:   Status post hysterectomy  Discharged Condition: good  Hospital Course:  The patient was admitted through pre-op holding where her consent was reviewed and all questions were answered. She was taken to the operating room by attending surgeon Dr. Shellia Cleverly. She underwent an uncomplicated robotic total laparoscopic hysterectomy, bilateral salpingectomy and cystoscopy. Please see operative dictation for further details. Following her surgery she was taken to the PACU for recovery and then transferred to extended recovery. On the evening of POD#0 her foley was removed. She was tolerating a regular diet, her pain was controlled on po pain medications, she was ambulating without assistance and voiding spontaneously. By POD#1, she was meeting all goals and deemed stable for discharge.  Consults: None  Significant Diagnostic Studies:  Recent Labs    12/18/18 1045 12/19/18 0505 12/19/18 0812  WBC 7.2 13.8* 13.9*  HGB 11.9* 9.9* 10.0*  HCT 38.4 33.0* 32.7*  PLT 342 288 298  NA  --  134*  --   K  --  3.9  --   CL  --  100  --   BUN  --  16  --   CREATININE  --  0.73  --    Treatments: surgery: robotic total laparoscopic hysterectomy, bilateral salpingectomy and cystoscopy  Discharge Exam: Blood pressure 105/70, pulse 73, temperature (!) 97.1 F (36.2 C), resp. rate 20, height 5\' 6"  (1.676 m), weight 86.9 kg, SpO2 96 %. Resting comfortably Abdomen soft, nondistended SCDs on  Disposition: Discharge disposition: 01-Home or Self Care       Discharge Instructions    Call MD for:  difficulty breathing, headache or visual disturbances   Complete by: As directed    Call MD for:  extreme fatigue   Complete by: As directed    Call  MD for:  hives   Complete by: As directed    Call MD for:  persistant dizziness or light-headedness   Complete by: As directed    Call MD for:  persistant nausea and vomiting   Complete by: As directed    Call MD for:  redness, tenderness, or signs of infection (pain, swelling, redness, odor or green/yellow discharge around incision site)   Complete by: As directed    Call MD for:  severe uncontrolled pain   Complete by: As directed    Call MD for:  temperature >100.4   Complete by: As directed    Diet general   Complete by: As directed    Increase activity slowly   Complete by: As directed    No lifting >10 lbs for 8 weeks Nothing in the vagina for 8 weeks   No wound care   Complete by: As directed      Allergies as of 12/19/2018      Reactions   Other    BLOOD REFUSAL    Sulfa Antibiotics Other (See Comments), Nausea And Vomiting   Severe bladder, and kidney infection   Trazodone And Nefazodone Other (See Comments)   Headache every am      Medication List    STOP taking these medications   fluticasone 50 MCG/ACT nasal spray Commonly known as: FLONASE   meloxicam 15 MG tablet Commonly known as: Mobic   traMADol 50 MG tablet Commonly known as: Veatrice Bourbon  TAKE these medications   ALPRAZolam 0.5 MG tablet Commonly known as: XANAX Take 0.5-1 tablets (0.25-0.5 mg total) by mouth daily as needed for anxiety.   amLODipine 10 MG tablet Commonly known as: NORVASC Take 1 tablet (10 mg total) by mouth daily.   docusate sodium 50 MG capsule Commonly known as: COLACE Take 1 capsule (50 mg total) by mouth 2 (two) times daily.   ibuprofen 800 MG tablet Commonly known as: ADVIL Take 1 tablet (800 mg total) by mouth every 8 (eight) hours as needed. Notes to patient: Next dose is due at 230 pm as needed for pain.   levothyroxine 125 MCG tablet Commonly known as: SYNTHROID Take 125 mcg by mouth daily before breakfast.   oxyCODONE-acetaminophen 5-325 MG tablet Commonly  known as: Percocet Take 1 tablet by mouth every 6 (six) hours as needed for severe pain. Notes to patient: Next dose is due at 1230 pm as needed for pain   sertraline 25 MG tablet Commonly known as: ZOLOFT Take 1 tablet (25 mg total) by mouth daily.   simethicone 80 MG chewable tablet Commonly known as: Gas-X Chew 1 tablet (80 mg total) by mouth every 6 (six) hours as needed for flatulence.      Follow-up Information    Taam-Akelman, Lawrence Santiago, MD. Schedule an appointment as soon as possible for a visit in 2 weeks.   Specialty: Obstetrics and Gynecology Contact information: [address] [place] [address] [phone]           Signed: Jonelle Sidle 12/19/2018, 10:06 AM

## 2018-12-18 NOTE — Anesthesia Procedure Notes (Signed)
Procedure Name: Intubation Date/Time: 12/18/2018 12:09 PM Performed by: Annye Asa, MD Pre-anesthesia Checklist: Patient identified, Emergency Drugs available, Suction available, Patient being monitored and Timeout performed Patient Re-evaluated:Patient Re-evaluated prior to induction Oxygen Delivery Method: Circle system utilized Preoxygenation: Pre-oxygenation with 100% oxygen Induction Type: IV induction Ventilation: Mask ventilation without difficulty Laryngoscope Size: Mac and 3 Grade View: Grade II Tube type: Oral Tube size: 7.5 mm Number of attempts: 2 (CRNA, then MD) Placement Confirmation: ETT inserted through vocal cords under direct vision,  positive ETCO2 and breath sounds checked- equal and bilateral Secured at: 22 cm Tube secured with: Tape Dental Injury: Teeth and Oropharynx as per pre-operative assessment

## 2018-12-19 ENCOUNTER — Encounter (HOSPITAL_BASED_OUTPATIENT_CLINIC_OR_DEPARTMENT_OTHER): Payer: Self-pay | Admitting: Obstetrics & Gynecology

## 2018-12-19 DIAGNOSIS — D259 Leiomyoma of uterus, unspecified: Secondary | ICD-10-CM | POA: Diagnosis not present

## 2018-12-19 DIAGNOSIS — Z85850 Personal history of malignant neoplasm of thyroid: Secondary | ICD-10-CM | POA: Diagnosis not present

## 2018-12-19 DIAGNOSIS — Z79899 Other long term (current) drug therapy: Secondary | ICD-10-CM | POA: Diagnosis not present

## 2018-12-19 DIAGNOSIS — Z531 Procedure and treatment not carried out because of patient's decision for reasons of belief and group pressure: Secondary | ICD-10-CM | POA: Diagnosis not present

## 2018-12-19 DIAGNOSIS — N920 Excessive and frequent menstruation with regular cycle: Secondary | ICD-10-CM | POA: Diagnosis not present

## 2018-12-19 DIAGNOSIS — F329 Major depressive disorder, single episode, unspecified: Secondary | ICD-10-CM | POA: Diagnosis not present

## 2018-12-19 DIAGNOSIS — Z882 Allergy status to sulfonamides status: Secondary | ICD-10-CM | POA: Diagnosis not present

## 2018-12-19 DIAGNOSIS — Z79890 Hormone replacement therapy (postmenopausal): Secondary | ICD-10-CM | POA: Diagnosis not present

## 2018-12-19 DIAGNOSIS — N838 Other noninflammatory disorders of ovary, fallopian tube and broad ligament: Secondary | ICD-10-CM | POA: Diagnosis not present

## 2018-12-19 DIAGNOSIS — I10 Essential (primary) hypertension: Secondary | ICD-10-CM | POA: Diagnosis not present

## 2018-12-19 DIAGNOSIS — F419 Anxiety disorder, unspecified: Secondary | ICD-10-CM | POA: Diagnosis not present

## 2018-12-19 DIAGNOSIS — Z888 Allergy status to other drugs, medicaments and biological substances status: Secondary | ICD-10-CM | POA: Diagnosis not present

## 2018-12-19 LAB — CBC
HCT: 33 % — ABNORMAL LOW (ref 36.0–46.0)
HCT: 32.7 % — ABNORMAL LOW (ref 36.0–46.0)
Hemoglobin: 9.9 g/dL — ABNORMAL LOW (ref 12.0–15.0)
Hemoglobin: 10 g/dL — ABNORMAL LOW (ref 12.0–15.0)
MCH: 27.7 pg (ref 26.0–34.0)
MCH: 28.2 pg (ref 26.0–34.0)
MCHC: 30 g/dL (ref 30.0–36.0)
MCHC: 30.6 g/dL (ref 30.0–36.0)
MCV: 92.2 fL (ref 80.0–100.0)
MCV: 92.1 fL (ref 80.0–100.0)
Platelets: 288 10*3/uL (ref 150–400)
Platelets: 298 10*3/uL (ref 150–400)
RBC: 3.58 MIL/uL — ABNORMAL LOW (ref 3.87–5.11)
RBC: 3.55 MIL/uL — ABNORMAL LOW (ref 3.87–5.11)
RDW: 14.2 % (ref 11.5–15.5)
RDW: 14.2 % (ref 11.5–15.5)
WBC: 13.8 10*3/uL — ABNORMAL HIGH (ref 4.0–10.5)
WBC: 13.9 10*3/uL — ABNORMAL HIGH (ref 4.0–10.5)
nRBC: 0 % (ref 0.0–0.2)
nRBC: 0 % (ref 0.0–0.2)

## 2018-12-19 LAB — BASIC METABOLIC PANEL
Anion gap: 12 (ref 5–15)
BUN: 16 mg/dL (ref 6–20)
CO2: 22 mmol/L (ref 22–32)
Calcium: 7.8 mg/dL — ABNORMAL LOW (ref 8.9–10.3)
Chloride: 100 mmol/L (ref 98–111)
Creatinine, Ser: 0.73 mg/dL (ref 0.44–1.00)
GFR calc Af Amer: >60 mL/min (ref >60)
GFR calc non Af Amer: >60 mL/min (ref >60)
Glucose, Bld: 122 mg/dL — ABNORMAL HIGH (ref 70–99)
Potassium: 3.9 mmol/L (ref 3.5–5.1)
Sodium: 134 mmol/L — ABNORMAL LOW (ref 135–145)

## 2018-12-19 LAB — TYPE AND SCREEN
ABO/RH(D): O POS
Antibody Screen: NEGATIVE

## 2018-12-19 MED ORDER — OXYCODONE-ACETAMINOPHEN 5-325 MG PO TABS
ORAL_TABLET | ORAL | Status: AC
  Filled 2018-12-19: qty 2

## 2018-12-19 MED ORDER — GABAPENTIN 100 MG PO CAPS
ORAL_CAPSULE | ORAL | Status: AC
  Filled 2018-12-19: qty 1

## 2018-12-19 MED ORDER — IBUPROFEN 800 MG PO TABS
ORAL_TABLET | ORAL | Status: AC
  Filled 2018-12-19: qty 1

## 2018-12-19 NOTE — Progress Notes (Signed)
OBGYN Note Kara Hall 48 y.o. POD#1 sp RATLH/BS/cysto. Reports ambulating last night, voiding spontaneously, tolerating PO without n/v, pain controlled with meds. Passing flatus.  O:  Vitals:   12/18/18 2145 12/19/18 0119 12/19/18 0634 12/19/18 0921  BP: 116/82 111/80 108/73 105/70  Pulse: 83 81 73 73  Resp: 16  16 20   Temp: 97.7 F (36.5 C) 97.7 F (36.5 C) (!) 97.3 F (36.3 C) (!) 97.1 F (36.2 C)  TempSrc:      SpO2: 95% 97% 96% 96%  Weight:      Height:       Resting comfortably Abdomen soft, nondistended SCDs on  Recent Labs    12/18/18 1045 12/19/18 0505 12/19/18 0812  WBC 7.2 13.8* 13.9*  HGB 11.9* 9.9* 10.0*  HCT 38.4 33.0* 32.7*  PLT 342 288 298  NA  --  134*  --   K  --  3.9  --   CL  --  100  --   BUN  --  16  --   CREATININE  --  0.73  --    A/p: Kara Hall 48 y.o. POD#1 sp RATLH/BS/cysto, doing well, discharge today. Reviewed precautions and pain expections Kara Hall 12/19/18 10:00 AM

## 2018-12-22 ENCOUNTER — Other Ambulatory Visit (HOSPITAL_COMMUNITY): Payer: Self-pay | Admitting: Obstetrics & Gynecology

## 2018-12-22 MED ORDER — TRAMADOL HCL 50 MG PO TABS: 50.0000 mg | ORAL_TABLET | Freq: Four times a day (QID) | ORAL | 0 refills | Status: DC | PRN

## 2018-12-22 MED ORDER — OXYCODONE-ACETAMINOPHEN 5-325 MG PO TABS: 1.0000 | ORAL_TABLET | Freq: Four times a day (QID) | ORAL | 0 refills | Status: DC | PRN

## 2018-12-22 NOTE — Progress Notes (Signed)
Patient put call into MD this A.M. requesting pain medication.

## 2018-12-22 NOTE — Progress Notes (Signed)
Rx for percocet x15 tabs and tramadol x30 tabs sent to pharmacy for postop pain [name]  12/22/18 9:30 AM

## 2018-12-23 LAB — SURGICAL PATHOLOGY

## 2018-12-23 NOTE — Anesthesia Postprocedure Evaluation (Signed)
Anesthesia Post Note  Patient: PENNIE SHAW  Procedure(s) Performed: XI ROBOTIC ASSISTED LAPAROSCOPIC HYSTERECTOMY AND BILATERAL SALPINGECTOMY (Bilateral Abdomen) LAPAROSCOPIC LYSIS OF ADHESIONS (N/A Abdomen) CYSTOSCOPY (N/A Bladder)     Patient location during evaluation: PACU Anesthesia Type: General Level of consciousness: sedated and patient cooperative Pain management: pain level controlled Vital Signs Assessment: post-procedure vital signs reviewed and stable Respiratory status: spontaneous breathing Cardiovascular status: stable Anesthetic complications: no    Last Vitals:  Vitals:   12/19/18 0634 12/19/18 0921  BP: 108/73 105/70  Pulse: 73 73  Resp: 16 20  Temp: (!) 36.3 C (!) 36.2 C  SpO2: 96% 96%    Last Pain:  Vitals:   12/22/18 0949  TempSrc:   PainSc: [place]

## 2019-01-02 ENCOUNTER — Other Ambulatory Visit: Payer: Self-pay | Admitting: Family Medicine

## 2019-01-02 DIAGNOSIS — F4323 Adjustment disorder with mixed anxiety and depressed mood: Secondary | ICD-10-CM

## 2019-01-02 NOTE — Telephone Encounter (Signed)
Requested medication (s) are due for refill today: yes  Requested medication (s) are on the active medication list: yes  Last refill:  11/24/2018  Future visit scheduled: yes  Notes to clinic:  refill cannot be delegated    Requested Prescriptions  Pending Prescriptions Disp Refills   sertraline (ZOLOFT) 50 MG tablet [Pharmacy Med Name: Sertraline HCl 50 MG Oral Tablet] 30 tablet 0    Sig: Take 1 tablet by mouth once daily      Psychiatry:  Antidepressants - SSRI Passed - 01/02/2019  2:45 PM      Passed - Valid encounter within last 6 months    Recent Outpatient Visits           1 month ago Essential hypertension, benign   [facility], Lilia Argue, MD   6 months ago Adjustment disorder with mixed anxiety and depressed mood   [facility], Lilia Argue, MD   7 months ago Adjustment disorder with mixed anxiety and depressed mood   [facility], Lilia Argue, MD   1 year ago Bilateral wrist pain   [facility] at [facility], Ines Bloomer, MD   1 year ago Bilateral wrist pain   [facility] at [facility], [place], [place]       Future Appointments             In 3 days Pamella Pert, Lilia Argue, MD [facility] at [place], [place]              ALPRAZolam [name]) 0.5 MG tablet [Pharmacy Med Name: ALPRAZolam 0.5 MG Oral Tablet] 30 tablet 0    Sig: TAKE 1/2 TO 1 (ONE-HALF TO ONE) TABLET BY MOUTH ONCE DAILY AS NEEDED FOR ANXIETY      Not Delegated - Psychiatry:  Anxiolytics/Hypnotics Failed - 01/02/2019  2:45 PM      Failed - This refill cannot be delegated      Failed - Urine Drug Screen completed in last 360 days.      Passed - Valid encounter within last 6 months    Recent Outpatient Visits           1 month ago Essential hypertension, benign   [facility], Lilia Argue, MD   6 months ago Adjustment disorder with mixed anxiety and depressed mood   [facility], Lilia Argue, MD   7 months ago Adjustment  disorder with mixed anxiety and depressed mood   [facility], Lilia Argue, MD   1 year ago Bilateral wrist pain   Primary Care at [facility], Ines Bloomer, MD   1 year ago Bilateral wrist pain   Primary Care at [facility], [place], [place]       Future Appointments             In 3 days Rutherford Guys, MD Primary Care at [place], [facility]

## 2019-01-05 ENCOUNTER — Telehealth (INDEPENDENT_AMBULATORY_CARE_PROVIDER_SITE_OTHER): Payer: BC Managed Care – PPO | Admitting: Family Medicine

## 2019-01-05 ENCOUNTER — Other Ambulatory Visit: Payer: Self-pay

## 2019-01-05 DIAGNOSIS — I10 Essential (primary) hypertension: Secondary | ICD-10-CM | POA: Diagnosis not present

## 2019-01-05 DIAGNOSIS — F4323 Adjustment disorder with mixed anxiety and depressed mood: Secondary | ICD-10-CM | POA: Diagnosis not present

## 2019-01-05 MED ORDER — ALPRAZOLAM 0.5 MG PO TABS: 0.2500 mg | ORAL_TABLET | Freq: Every day | ORAL | 0 refills | Status: DC | PRN

## 2019-01-05 NOTE — Progress Notes (Signed)
Virtual Visit Note  I connected with patient on 01/05/19 at 545pm by phone and verified that I am speaking with the correct person using two identifiers. Kara Hall is currently located at home and patient is currently with them during visit. The provider, Rutherford Guys, MD is located in their office at time of visit.  I discussed the limitations, risks, security and privacy concerns of performing an evaluation and management service by telephone and the availability of in person appointments. I also discussed with the patient that there may be a patient responsible charge related to this service. The patient expressed understanding and agreed to proceed.   CC: followup   HPI ? PMH: hypothryodism s/p thyroid cancer, GAD, chronic pain, RLE DVT, migraines  Last OV a month ago -  increased amlodipine to 10mg  - BP at obgyn in the office 144/90 but in the hosp on day of discharge 105/70 pmp reviewed Recent prolonged QTc so keeping sertraline at low dose Had hysterectomy for fibroids a couple of weeks ago, went well, recovering well, mostly using ibuprofen for pain Patient is tearful, her dog is dying  Allergies  Allergen Reactions  . Other     BLOOD REFUSAL   . Sulfa Antibiotics Other (See Comments) and Nausea And Vomiting    Severe bladder, and kidney infection  . Trazodone And Nefazodone Other (See Comments)    Headache every am     Prior to Admission medications   Medication Sig Start Date End Date Taking? Authorizing Provider  ALPRAZolam [name]) 0.5 MG tablet Take 0.5-1 tablets (0.25-0.5 mg total) by mouth daily as needed for anxiety. 11/24/18   Rutherford Guys, MD  amLODipine (NORVASC) 10 MG tablet Take 1 tablet (10 mg total) by mouth daily. 11/24/18   Rutherford Guys, MD  docusate sodium (COLACE) 50 MG capsule Take 1 capsule (50 mg total) by mouth 2 (two) times daily. 12/18/18   Taam-Akelman, Lawrence Santiago, MD  ibuprofen (ADVIL) 800 MG tablet Take 1 tablet (800 mg  total) by mouth every 8 (eight) hours as needed. 12/18/18   Taam-Akelman, Lawrence Santiago, MD  levothyroxine (SYNTHROID, LEVOTHROID) 125 MCG tablet Take 125 mcg by mouth daily before breakfast.     [provider]  oxyCODONE-acetaminophen (PERCOCET) 5-325 MG tablet Take 1 tablet by mouth every 6 (six) hours as needed for severe pain. 12/18/18   Taam-Akelman, Lawrence Santiago, MD  oxyCODONE-acetaminophen (PERCOCET) 5-325 MG tablet Take 1 tablet by mouth every 6 (six) hours as needed for severe pain. 12/22/18 12/22/19  Jonelle Sidle, MD  sertraline (ZOLOFT) 25 MG tablet Take 1 tablet (25 mg total) by mouth daily. 06/26/18   Rutherford Guys, MD  simethicone (GAS-X) 80 MG chewable tablet Chew 1 tablet (80 mg total) by mouth every 6 (six) hours as needed for flatulence. 12/18/18   Taam-Akelman, Lawrence Santiago, MD  traMADol (ULTRAM) 50 MG tablet Take 1 tablet (50 mg total) by mouth every 6 (six) hours as needed. 12/22/18 12/22/19  Jonelle Sidle, MD    Past Medical History:  Diagnosis Date  . Anemia   . Anxiety    panic attack, also reports problems with depression, relative to her job & upcoming surgery , relative to life problems - with children & spouse    . Back complaints 2013   has had steroid injection   . Depression   . DVT (deep venous thrombosis) ([place]) 2001   RLE  . Fibroids   . Foot fracture, left   .  HTN (hypertension)   . Migraine    h/o migraines - as a teenager  & again now with family issues, states she has to lay down & rest in a dark room  (08/28/2013)  . Muscle strain of hand    wearing home made splint today, L hand  . Peripheral vascular disease (HCC)    DVT- per pt. in 2001- had vein removed.   . Refusal of blood transfusions as patient is Jehovah's Witness   . Thyroid cancer ([place])   . Thyroid nodule     Past Surgical History:  Procedure Laterality Date  . CYSTOSCOPY N/A 12/18/2018   Procedure: CYSTOSCOPY;  Surgeon: Jonelle Sidle, MD;  Location: [facility];  Service: Gynecology;  Laterality: N/A;  . LAPAROSCOPIC LYSIS OF ADHESIONS N/A 12/18/2018   Procedure: LAPAROSCOPIC LYSIS OF ADHESIONS;  Surgeon: Jonelle Sidle, MD;  Location: [facility];  Service: Gynecology;  Laterality: N/A;  . ROBOTIC ASSISTED LAPAROSCOPIC HYSTERECTOMY AND SALPINGECTOMY Bilateral 12/18/2018   Procedure: XI ROBOTIC ASSISTED LAPAROSCOPIC HYSTERECTOMY AND BILATERAL SALPINGECTOMY;  Surgeon: Jonelle Sidle, MD;  Location: [facility];  Service: Gynecology;  Laterality: Bilateral;  . THROMBECTOMY Right 2001   leg; after 3rd child was born; for deep vein thrombosis  . THYROIDECTOMY Right 08/28/2013   Procedure: RIGHT THYROID LOBECTOMY POSSIBLE TOTAL THYROIDECTOMY;  Surgeon: Jodi Marble, MD;  Location: [facility];  Service: ENT;  Laterality: Right;  . THYROIDECTOMY Left 09/10/2013   Procedure: COMPLETE LEFT THYROIDECTOMY;  Surgeon: Jodi Marble, MD;  Location: [facility];  Service: ENT;  Laterality: Left;  . TUBAL LIGATION  2001    Social History   Tobacco Use  . Smoking status: Never Smoker  . Smokeless tobacco: Never Used  Substance Use Topics  . Alcohol use: Yes    Comment: Cocktail or glass of wine occasionally    Family History  Problem Relation Age of Onset  . Heart disease Mother   . Hyperlipidemia Mother   . Heart disease Father   . Hyperlipidemia Father   . Diabetes Father   . Heart disease Brother   . Hyperlipidemia Brother   . Hypertension Brother   . Heart disease Brother   . Hyperlipidemia Brother     ROS Per hpi  Objective  Vitals as reported by the patient: above   ASSESSMENT and PLAN  1. Adjustment disorder with mixed anxiety and depressed mood - ALPRAZolam (XANAX) 0.5 MG tablet; Take 0.5-1 tablets (0.25-0.5 mg total) by mouth daily as needed for anxiety.  2. Essential hypertension, benign Stable, cont current regime  FOLLOW-UP: 4 weeks   The above assessment and management  plan was discussed with the patient. The patient verbalized understanding of and has agreed to the management plan. Patient is aware to call the clinic if symptoms persist or worsen. Patient is aware when to return to the clinic for a follow-up visit. Patient educated on when it is appropriate to go to the emergency department.    I provided 13 minutes of non-face-to-face time during this encounter.  Rutherford Guys, MD Primary Care at [facility] [place] [place], [place] [address] Ph.  [phone] Fax [phone]

## 2019-01-05 NOTE — Progress Notes (Signed)
Pt is asking for refill on xanax, it helps her sleep. She just had hysterectomy. She says  The med helps her sleep, she has had some nights is tossing and turning, also dealing with the illness of her 77yr dog whom has a tumor of the eye and may have to be put down. Medication and the pharmacy. Gad 7 completed score 17

## 2019-01-20 ENCOUNTER — Encounter: Payer: Self-pay | Admitting: Family Medicine

## 2019-01-20 ENCOUNTER — Other Ambulatory Visit: Payer: Self-pay

## 2019-01-20 ENCOUNTER — Ambulatory Visit: Payer: BC Managed Care – PPO | Admitting: Family Medicine

## 2019-01-20 VITALS — BP 124/85 | HR 85 | Temp 98.3°F | Ht 66.0 in | Wt 194.8 lb

## 2019-01-20 DIAGNOSIS — F419 Anxiety disorder, unspecified: Secondary | ICD-10-CM

## 2019-01-20 DIAGNOSIS — I10 Essential (primary) hypertension: Secondary | ICD-10-CM | POA: Diagnosis not present

## 2019-01-20 DIAGNOSIS — Z6831 Body mass index (BMI) 31.0-31.9, adult: Secondary | ICD-10-CM

## 2019-01-20 DIAGNOSIS — R9431 Abnormal electrocardiogram [ECG] [EKG]: Secondary | ICD-10-CM | POA: Diagnosis not present

## 2019-01-20 DIAGNOSIS — F329 Major depressive disorder, single episode, unspecified: Secondary | ICD-10-CM

## 2019-01-20 DIAGNOSIS — F32A Depression, unspecified: Secondary | ICD-10-CM

## 2019-01-20 MED ORDER — SERTRALINE HCL 25 MG PO TABS: 25.0000 mg | ORAL_TABLET | Freq: Every day | ORAL | 0 refills | Status: DC

## 2019-01-20 NOTE — Patient Instructions (Signed)
° ° ° °  If you have lab work done today you will be contacted with your lab results within the next 2 weeks.  If you have not heard from us then please contact us. The fastest way to get your results is to register for My Chart. ° ° °IF you received an x-ray today, you will receive an invoice from [facility]. Please contact [facility] at [phone] with questions or concerns regarding your invoice.  ° °IF you received labwork today, you will receive an invoice from [facility]. Please contact [facility] at [phone] with questions or concerns regarding your invoice.  ° °Our billing staff will not be able to assist you with questions regarding bills from these companies. ° °You will be contacted with the lab results as soon as they are available. The fastest way to get your results is to activate your My Chart account. Instructions are located on the last page of this paperwork. If you have not heard from us regarding the results in 2 weeks, please contact this office. °  ° ° ° °

## 2019-01-20 NOTE — Progress Notes (Signed)
1/5/202112:03 PM  Kara Hall [birth year], 49 y.o., female [id number]  Chief Complaint  Patient presents with  . Anxiety    wants to talk cool sculping, wants to make sure that it does not interfere with tsh  . Depression    HPI:   Patient is a 49 y.o. female with past medical history significant for hypothryodism s/p thyroid cancer, GAD, chronic pain, RLE DVT, migraines who presents today for routine followup  Last OV Dec 2020 Had recent prolonged QTc - 510 in nov Small rx of xanax given as her dog was dying Taking low dose sertraline GAD 7 = 15 PHQ9=19, no SI Declines counseling  Sees endo once a year, every March  Considering cool sculpting for weight loss Tried to calorie count - "gave up" States short on funds to invest on healthy foods Walks her dog daily but no other forms of exercise   Depression screen [facility] 2/9 01/05/2019 11/24/2018 06/26/2018  Decreased Interest 0 2 2  Down, Depressed, Hopeless 0 2 1  PHQ - 2 Score 0 4 3  Altered sleeping - 3 3  Tired, decreased energy - 3 2  Change in appetite - 1 3  Feeling bad or failure about yourself  - 0 0  Trouble concentrating - 0 0  Moving slowly or fidgety/restless - 0 0  Suicidal thoughts - 0 0  PHQ-9 Score - 11 11  Difficult doing work/chores - Somewhat difficult -    Fall Risk  01/05/2019 06/26/2018 05/26/2018 12/10/2017 06/03/2017  Falls in the past year? 0 0 0 0 No  Number falls in past yr: 0 0 0 - -  Comment - - - - -  Injury with Fall? 0 0 1 - -  Comment - - - - -  Follow up - - - - -     Allergies  Allergen Reactions  . Other     BLOOD REFUSAL   . Sulfa Antibiotics Other (See Comments) and Nausea And Vomiting    Severe bladder, and kidney infection  . Trazodone And Nefazodone Other (See Comments)    Headache every am     Prior to Admission medications   Medication Sig Start Date End Date Taking? Authorizing Provider  ALPRAZolam [name]) 0.5 MG tablet Take 0.5-1 tablets (0.25-0.5 mg  total) by mouth daily as needed for anxiety. 01/05/19  Yes Rutherford Guys, MD  amLODipine (NORVASC) 10 MG tablet Take 1 tablet (10 mg total) by mouth daily. 11/24/18  Yes Rutherford Guys, MD  docusate sodium (COLACE) 50 MG capsule Take 1 capsule (50 mg total) by mouth 2 (two) times daily. 12/18/18  Yes Taam-Akelman, Lawrence Santiago, MD  ibuprofen (ADVIL) 800 MG tablet Take 1 tablet (800 mg total) by mouth every 8 (eight) hours as needed. 12/18/18  Yes Taam-Akelman, Lawrence Santiago, MD  levothyroxine (SYNTHROID, LEVOTHROID) 125 MCG tablet Take 125 mcg by mouth daily before breakfast.    Yes [provider]  oxyCODONE-acetaminophen (PERCOCET) 5-325 MG tablet Take 1 tablet by mouth every 6 (six) hours as needed for severe pain. 12/18/18  Yes Taam-Akelman, Lawrence Santiago, MD  oxyCODONE-acetaminophen (PERCOCET) 5-325 MG tablet Take 1 tablet by mouth every 6 (six) hours as needed for severe pain. 12/22/18 12/22/19 Yes Taam-Akelman, Lawrence Santiago, MD  sertraline (ZOLOFT) 25 MG tablet Take 1 tablet (25 mg total) by mouth daily. 06/26/18  Yes Rutherford Guys, MD  simethicone (GAS-X) 80 MG chewable tablet Chew 1 tablet (80 mg total) by mouth every  6 (six) hours as needed for flatulence. 12/18/18  Yes Taam-Akelman, Lawrence Santiago, MD  traMADol (ULTRAM) 50 MG tablet Take 1 tablet (50 mg total) by mouth every 6 (six) hours as needed. 12/22/18 12/22/19 Yes Taam-Akelman, Lawrence Santiago, MD    Past Medical History:  Diagnosis Date  . Anemia   . Anxiety    panic attack, also reports problems with depression, relative to her job & upcoming surgery , relative to life problems - with children & spouse    . Back complaints 2013   has had steroid injection   . Depression   . DVT (deep venous thrombosis) ([place]) 2001   RLE  . Fibroids   . Foot fracture, left   . HTN (hypertension)   . Migraine    h/o migraines - as a teenager  & again now with family issues, states she has to lay down & rest in a dark room  (08/28/2013)  . Muscle strain of  hand    wearing home made splint today, L hand  . Peripheral vascular disease (HCC)    DVT- per pt. in 2001- had vein removed.   . Refusal of blood transfusions as patient is Jehovah's Witness   . Thyroid cancer ([place])   . Thyroid nodule     Past Surgical History:  Procedure Laterality Date  . CYSTOSCOPY N/A 12/18/2018   Procedure: CYSTOSCOPY;  Surgeon: Jonelle Sidle, MD;  Location: [facility];  Service: Gynecology;  Laterality: N/A;  . LAPAROSCOPIC LYSIS OF ADHESIONS N/A 12/18/2018   Procedure: LAPAROSCOPIC LYSIS OF ADHESIONS;  Surgeon: Jonelle Sidle, MD;  Location: [facility];  Service: Gynecology;  Laterality: N/A;  . ROBOTIC ASSISTED LAPAROSCOPIC HYSTERECTOMY AND SALPINGECTOMY Bilateral 12/18/2018   Procedure: XI ROBOTIC ASSISTED LAPAROSCOPIC HYSTERECTOMY AND BILATERAL SALPINGECTOMY;  Surgeon: Jonelle Sidle, MD;  Location: [facility];  Service: Gynecology;  Laterality: Bilateral;  . THROMBECTOMY Right 2001   leg; after 3rd child was born; for deep vein thrombosis  . THYROIDECTOMY Right 08/28/2013   Procedure: RIGHT THYROID LOBECTOMY POSSIBLE TOTAL THYROIDECTOMY;  Surgeon: Jodi Marble, MD;  Location: [facility];  Service: ENT;  Laterality: Right;  . THYROIDECTOMY Left 09/10/2013   Procedure: COMPLETE LEFT THYROIDECTOMY;  Surgeon: Jodi Marble, MD;  Location: [facility];  Service: ENT;  Laterality: Left;  . TUBAL LIGATION  2001    Social History   Tobacco Use  . Smoking status: Never Smoker  . Smokeless tobacco: Never Used  Substance Use Topics  . Alcohol use: Yes    Comment: Cocktail or glass of wine occasionally    Family History  Problem Relation Age of Onset  . Heart disease Mother   . Hyperlipidemia Mother   . Heart disease Father   . Hyperlipidemia Father   . Diabetes Father   . Heart disease Brother   . Hyperlipidemia Brother   . Hypertension Brother   . Heart disease Brother   . Hyperlipidemia  Brother     Review of Systems  Constitutional: Negative for chills and fever.  Respiratory: Negative for cough and shortness of breath.   Cardiovascular: Negative for chest pain, palpitations and leg swelling.  Gastrointestinal: Negative for abdominal pain, nausea and vomiting.     OBJECTIVE:  Today's Vitals   01/20/19 1151  BP: 124/85  Pulse: 85  Temp: 98.3 F (36.8 C)  SpO2: 100%  Weight: 194 lb 12.8 oz (88.4 kg)  Height: 5\' 6"  (1.676 m)   Body mass index  is 31.44 kg/m.   Physical Exam Vitals and nursing note reviewed.  Constitutional:      Appearance: She is well-developed.  HENT:     Head: Normocephalic and atraumatic.     Mouth/Throat:     Pharynx: No oropharyngeal exudate.  Eyes:     General: No scleral icterus.    Conjunctiva/sclera: Conjunctivae normal.     Pupils: Pupils are equal, round, and reactive to light.  Cardiovascular:     Rate and Rhythm: Normal rate and regular rhythm.     Heart sounds: Normal heart sounds. No murmur. No friction rub. No gallop.   Pulmonary:     Effort: Pulmonary effort is normal.     Breath sounds: Normal breath sounds. No wheezing or rales.  Musculoskeletal:     Cervical back: Neck supple.  Skin:    General: Skin is warm and dry.  Neurological:     Mental Status: She is alert and oriented to person, place, and time.     No results found for this or any previous visit (from the past 24 hour(s)).  No results found.  My interpretation of EKG:  NSR, HR 71, Qtc 450, acute ST changes  ASSESSMENT and PLAN  1. Essential hypertension, benign Controlled. Continue current regime.   2. Prolonged Q-T interval on ECG - EKG 12-Lead - currently QTc ok - Ambulatory referral to Psychiatry  3. Anxiety and depression Uncontrolled. I have reservations with increasing SSRI given recent prolonged QTc, referring to psych for further eval and mgt - Ambulatory referral to Psychiatry  4. BMI 31.0-31.9,adult Discussed specific  weight loss strategies.   Return in about 6 months (around 07/20/2019).    Rutherford Guys, MD Primary Care at [place], [place] [address] Ph.  [phone] Fax [phone]

## 2019-02-03 ENCOUNTER — Other Ambulatory Visit: Payer: Self-pay | Admitting: Family Medicine

## 2019-02-03 NOTE — Telephone Encounter (Signed)
Requested Prescriptions  Pending Prescriptions Disp Refills  . amLODipine (NORVASC) 10 MG tablet [Pharmacy Med Name: amLODIPine Besylate 10 MG Oral Tablet] 90 tablet 0    Sig: Take 1 tablet by mouth once daily     Cardiovascular:  Calcium Channel Blockers Passed - 02/03/2019  9:27 AM      Passed - Last BP in normal range    BP Readings from Last 1 Encounters:  01/20/19 124/85         Passed - Valid encounter within last 6 months    Recent Outpatient Visits          2 weeks ago Essential hypertension, benign   [facility], Lilia Argue, MD   4 weeks ago Adjustment disorder with mixed anxiety and depressed mood   [facility], Lilia Argue, MD   2 months ago Essential hypertension, benign   [facility], Lilia Argue, MD   7 months ago Adjustment disorder with mixed anxiety and depressed mood   [facility], Lilia Argue, MD   8 months ago Adjustment disorder with mixed anxiety and depressed mood   [facility], Lilia Argue, MD      Future Appointments            In 5 months Rutherford Guys, MD Primary Care at [place]

## 2019-02-21 DIAGNOSIS — M25511 Pain in right shoulder: Secondary | ICD-10-CM | POA: Diagnosis not present

## 2019-03-09 ENCOUNTER — Telehealth (INDEPENDENT_AMBULATORY_CARE_PROVIDER_SITE_OTHER): Payer: BC Managed Care – PPO | Admitting: Registered Nurse

## 2019-03-09 ENCOUNTER — Other Ambulatory Visit: Payer: Self-pay

## 2019-03-09 DIAGNOSIS — M25512 Pain in left shoulder: Secondary | ICD-10-CM | POA: Diagnosis not present

## 2019-03-09 MED ORDER — TRAMADOL HCL 50 MG PO TABS: 50.0000 mg | ORAL_TABLET | Freq: Four times a day (QID) | ORAL | 0 refills | Status: DC | PRN

## 2019-03-09 MED ORDER — METHOCARBAMOL 500 MG PO TABS: 500.0000 mg | ORAL_TABLET | Freq: Four times a day (QID) | ORAL | 0 refills | Status: DC

## 2019-03-09 MED ORDER — DICLOFENAC SODIUM 75 MG PO TBEC: 75.0000 mg | DELAYED_RELEASE_TABLET | Freq: Two times a day (BID) | ORAL | 0 refills | Status: DC

## 2019-03-09 NOTE — Patient Instructions (Signed)
° ° ° °  If you have lab work done today you will be contacted with your lab results within the next 2 weeks.  If you have not heard from us then please contact us. The fastest way to get your results is to register for My Chart. ° ° °IF you received an x-ray today, you will receive an invoice from [facility]. Please contact [facility] at [phone] with questions or concerns regarding your invoice.  ° °IF you received labwork today, you will receive an invoice from [facility]. Please contact [facility] at [phone] with questions or concerns regarding your invoice.  ° °Our billing staff will not be able to assist you with questions regarding bills from these companies. ° °You will be contacted with the lab results as soon as they are available. The fastest way to get your results is to activate your My Chart account. Instructions are located on the last page of this paperwork. If you have not heard from us regarding the results in 2 weeks, please contact this office. °  ° ° ° °

## 2019-03-09 NOTE — Progress Notes (Signed)
Patient states she has been having pain in her left shoulder for about 3 weeks . Started as a numbing sensation , and now its hurts and burning. Per patient just went back to work 02/16/2019 from having a hysterectomy and doesn't know how she will work due to shoulder pain. Per patient she went to [facility] 3 weeks ago had a steroid shot and prednisone , but didn't help for a long time.

## 2019-03-14 NOTE — Progress Notes (Signed)
Telemedicine Encounter- SOAP NOTE Established Patient  This telephone encounter was conducted with the patient's (or proxy's) verbal consent via audio telecommunications: yes  Patient was instructed to have this encounter in a suitably private space; and to only have persons present to whom they give permission to participate. In addition, patient identity was confirmed by use of name plus two identifiers (DOB and address).  I discussed the limitations, risks, security and privacy concerns of performing an evaluation and management service by telephone and the availability of in person appointments. I also discussed with the patient that there may be a patient responsible charge related to this service. The patient expressed understanding and agreed to proceed.  I spent a total of 15 minutes talking with the patient or their proxy.  No chief complaint on file.   Subjective   Kara Hall is a 49 y.o. established patient. Telephone visit today for shoulder pain  HPI Onset 2-3 weeks ago with some numb feeling in L trapezius. Now it is a pulling, burning pain. Recently returned to work on 02/16/19 from a hysterectomy. Does not want to miss more time  No acute injury that she is aware of. Admits to poor posture at times, spent a lot of time sedentary when recovering from hysterectomy. Feeling is located around shoulder blade mostly at this time.  Otherwise no concerns.  Patient Active Problem List   Diagnosis Date Noted  . Status post hysterectomy 12/18/2018  . Prolonged Q-T interval on ECG 11/24/2018  . Essential hypertension, benign 11/24/2018  . Dyspareunia in female 03/24/2018  . Increased body mass index 03/24/2018  . Carpal tunnel syndrome on both sides 12/10/2017  . Achilles tendon contracture, left 10/04/2016  . Displaced fracture of fifth metatarsal bone of left foot with delayed healing 05/07/2016  . Adjustment disorder with mixed anxiety and depressed mood 11/28/2015   . Bilateral wrist pain 11/28/2015  . History of thyroid cancer 08/09/2013  . Anxiety 01/12/2013    Past Medical History:  Diagnosis Date  . Anemia   . Anxiety    panic attack, also reports problems with depression, relative to her job & upcoming surgery , relative to life problems - with children & spouse    . Back complaints 2013   has had steroid injection   . Depression   . DVT (deep venous thrombosis) ([place]) 2001   RLE  . Fibroids   . Foot fracture, left   . HTN (hypertension)   . Migraine    h/o migraines - as a teenager  & again now with family issues, states she has to lay down & rest in a dark room  (08/28/2013)  . Muscle strain of hand    wearing home made splint today, L hand  . Peripheral vascular disease ([facility])    DVT- per pt. in 2001- had vein removed.   . Refusal of blood transfusions as patient is Jehovah's Witness   . Thyroid cancer ([place])   . Thyroid nodule     Current Outpatient Medications  Medication Sig Dispense Refill  . ALPRAZolam (XANAX) 0.5 MG tablet Take 0.5-1 tablets (0.25-0.5 mg total) by mouth daily as needed for anxiety. 30 tablet 0  . amLODipine (NORVASC) 10 MG tablet Take 1 tablet by mouth once daily 90 tablet 0  . levothyroxine (SYNTHROID, LEVOTHROID) 125 MCG tablet Take 125 mcg by mouth daily before breakfast.     . sertraline (ZOLOFT) 25 MG tablet Take 1 tablet (25 mg total) by mouth  daily. 90 tablet 0  . traMADol (ULTRAM) 50 MG tablet Take 1 tablet (50 mg total) by mouth every 6 (six) hours as needed for up to 15 doses. 15 tablet 0  . diclofenac (VOLTAREN) 75 MG EC tablet Take 1 tablet (75 mg total) by mouth 2 (two) times daily. 30 tablet 0  . docusate sodium (COLACE) 50 MG capsule Take 1 capsule (50 mg total) by mouth 2 (two) times daily. (Patient not taking: Reported on 03/09/2019) 30 capsule 1  . ibuprofen (ADVIL) 800 MG tablet Take 1 tablet (800 mg total) by mouth every 8 (eight) hours as needed. (Patient not taking: Reported on 03/09/2019) 30  tablet 1  . methocarbamol (ROBAXIN) 500 MG tablet Take 1 tablet (500 mg total) by mouth 4 (four) times daily. 60 tablet 0  . oxyCODONE-acetaminophen (PERCOCET) 5-325 MG tablet Take 1 tablet by mouth every 6 (six) hours as needed for severe pain. (Patient not taking: Reported on 03/09/2019) 20 tablet 0  . oxyCODONE-acetaminophen (PERCOCET) 5-325 MG tablet Take 1 tablet by mouth every 6 (six) hours as needed for severe pain. (Patient not taking: Reported on 03/09/2019) 15 tablet 0  . simethicone (GAS-X) 80 MG chewable tablet Chew 1 tablet (80 mg total) by mouth every 6 (six) hours as needed for flatulence. (Patient not taking: Reported on 03/09/2019) 30 tablet 1   No current facility-administered medications for this visit.    Allergies  Allergen Reactions  . Other     BLOOD REFUSAL   . Sulfa Antibiotics Other (See Comments) and Nausea And Vomiting    Severe bladder, and kidney infection  . Trazodone And Nefazodone Other (See Comments)    Headache every am     Social History   Socioeconomic History  . Marital status: Married    Spouse name: [name]  . Number of children: 3  . Years of education: 12+  . Highest education level: Not on file  Occupational History  . Occupation: [employer]  . Occupation: Paediatric nurse: [employer]  Tobacco Use  . Smoking status: Never Smoker  . Smokeless tobacco: Never Used  Substance and Sexual Activity  . Alcohol use: Yes    Comment: Cocktail or glass of wine occasionally  . Drug use: No  . Sexual activity: Yes    Partners: Male    Birth control/protection: Surgical    Comment: Tubal ligation  Other Topics Concern  . Not on file  Social History Narrative   Divorce from previous husband in 06/2016.   Now remarried 07/2016.   Lives at home with her new husband and cat.   She has three grown children who live with her husband. She has a good relationship with her children.   Her father passed away 06-07-2016.   Social Determinants of  Health   Financial Resource Strain:   . Difficulty of Paying Living Expenses: Not on file  Food Insecurity:   . Worried About Charity fundraiser in the Last Year: Not on file  . Ran Out of Food in the Last Year: Not on file  Transportation Needs:   . Lack of Transportation (Medical): Not on file  . Lack of Transportation (Non-Medical): Not on file  Physical Activity:   . Days of Exercise per Week: Not on file  . Minutes of Exercise per Session: Not on file  Stress:   . Feeling of Stress : Not on file  Social Connections:   . Frequency of Communication with Friends and  Family: Not on file  . Frequency of Social Gatherings with Friends and Family: Not on file  . Attends Religious Services: Not on file  . Active Member of Clubs or Organizations: Not on file  . Attends Archivist Meetings: Not on file  . Marital Status: Not on file  Intimate Partner Violence:   . Fear of Current or Ex-Partner: Not on file  . Emotionally Abused: Not on file  . Physically Abused: Not on file  . Sexually Abused: Not on file    ROS Per hpi   Objective   Vitals as reported by the patient: There were no vitals filed for this visit.  Diagnoses and all orders for this visit:  Acute pain of left shoulder -     diclofenac (VOLTAREN) 75 MG EC tablet; Take 1 tablet (75 mg total) by mouth 2 (two) times daily. -     methocarbamol (ROBAXIN) 500 MG tablet; Take 1 tablet (500 mg total) by mouth 4 (four) times daily. -     traMADol (ULTRAM) 50 MG tablet; Take 1 tablet (50 mg total) by mouth every 6 (six) hours as needed for up to 15 doses.   PLAN  Diclofenac 75mg  PO bid PRN, methocarbamol 500mg  PO qid PRN, tramadol 50mg  PO qhs PRN  Reviewed RICE, stretches, and other nonpharm  Patient in agreement with plan  Return to clinic if pain worsens or fails to improve  Patient encouraged to call clinic with any questions, comments, or concerns.  I discussed the assessment and treatment plan with  the patient. The patient was provided an opportunity to ask questions and all were answered. The patient agreed with the plan and demonstrated an understanding of the instructions.   The patient was advised to call back or seek an in-person evaluation if the symptoms worsen or if the condition fails to improve as anticipated.  I provided 15 minutes of non-face-to-face time during this encounter.  Maximiano Coss, NP  Primary Care at [facility]

## 2019-03-17 ENCOUNTER — Ambulatory Visit: Payer: BC Managed Care – PPO | Admitting: Sports Medicine

## 2019-03-17 ENCOUNTER — Encounter: Payer: Self-pay | Admitting: Sports Medicine

## 2019-03-17 ENCOUNTER — Ambulatory Visit (INDEPENDENT_AMBULATORY_CARE_PROVIDER_SITE_OTHER): Payer: BC Managed Care – PPO

## 2019-03-17 ENCOUNTER — Other Ambulatory Visit: Payer: Self-pay

## 2019-03-17 DIAGNOSIS — M722 Plantar fascial fibromatosis: Secondary | ICD-10-CM

## 2019-03-17 DIAGNOSIS — M779 Enthesopathy, unspecified: Secondary | ICD-10-CM | POA: Diagnosis not present

## 2019-03-17 DIAGNOSIS — M79671 Pain in right foot: Secondary | ICD-10-CM | POA: Diagnosis not present

## 2019-03-17 MED ORDER — TRAMADOL HCL 50 MG PO TABS: 50.0000 mg | ORAL_TABLET | Freq: Three times a day (TID) | ORAL | 0 refills | Status: DC | PRN

## 2019-03-17 MED ORDER — TRIAMCINOLONE ACETONIDE 10 MG/ML IJ SUSP
10.0000 mg | Freq: Once | INTRAMUSCULAR | Status: AC
  Administered 2019-03-17: 22:00:00 10 mg

## 2019-03-17 NOTE — Patient Instructions (Addendum)
Plantar Fasciitis (Heel Spur Syndrome) with Rehab The plantar fascia is a fibrous, ligament-like, soft-tissue structure that spans the bottom of the foot. Plantar fasciitis is a condition that causes pain in the foot due to inflammation of the tissue. SYMPTOMS   Pain and tenderness on the underneath side of the foot.  Pain that worsens with standing or walking. CAUSES  Plantar fasciitis is caused by irritation and injury to the plantar fascia on the underneath side of the foot. Common mechanisms of injury include:  Direct trauma to bottom of the foot.  Damage to a small nerve that runs under the foot where the main fascia attaches to the heel bone.  Stress placed on the plantar fascia due to bone spurs. RISK INCREASES WITH:   Activities that place stress on the plantar fascia (running, jumping, pivoting, or cutting).  Poor strength and flexibility.  Improperly fitted shoes.  Tight calf muscles.  Flat feet.  Failure to warm-up properly before activity.  Obesity. PREVENTION  Warm up and stretch properly before activity.  Allow for adequate recovery between workouts.  Maintain physical fitness:  Strength, flexibility, and endurance.  Cardiovascular fitness.  Maintain a health body weight.  Avoid stress on the plantar fascia.  Wear properly fitted shoes, including arch supports for individuals who have flat feet.  PROGNOSIS  If treated properly, then the symptoms of plantar fasciitis usually resolve without surgery. However, occasionally surgery is necessary.  RELATED COMPLICATIONS   Recurrent symptoms that may result in a chronic condition.  Problems of the lower back that are caused by compensating for the injury, such as limping.  Pain or weakness of the foot during push-off following surgery.  Chronic inflammation, scarring, and partial or complete fascia tear, occurring more often from repeated injections.  TREATMENT  Treatment initially involves the  use of ice and medication to help reduce pain and inflammation. The use of strengthening and stretching exercises may help reduce pain with activity, especially stretches of the Achilles tendon. These exercises may be performed at home or with a therapist. Your caregiver may recommend that you use heel cups of arch supports to help reduce stress on the plantar fascia. Occasionally, corticosteroid injections are given to reduce inflammation. If symptoms persist for greater than 6 months despite non-surgical (conservative), then surgery may be recommended.   MEDICATION   If pain medication is necessary, then nonsteroidal anti-inflammatory medications, such as aspirin and ibuprofen, or other minor pain relievers, such as acetaminophen, are often recommended.  Do not take pain medication within 7 days before surgery.  Prescription pain relievers may be given if deemed necessary by your caregiver. Use only as directed and only as much as you need.  Corticosteroid injections may be given by your caregiver. These injections should be reserved for the most serious cases, because they may only be given a certain number of times.  HEAT AND COLD  Cold treatment (icing) relieves pain and reduces inflammation. Cold treatment should be applied for 10 to 15 minutes every 2 to 3 hours for inflammation and pain and immediately after any activity that aggravates your symptoms. Use ice packs or massage the area with a piece of ice (ice massage).  Heat treatment may be used prior to performing the stretching and strengthening activities prescribed by your caregiver, physical therapist, or athletic trainer. Use a heat pack or soak the injury in warm water.  SEEK IMMEDIATE MEDICAL CARE IF:  Treatment seems to offer no benefit, or the condition worsens.  Any medications   produce adverse side effects.  EXERCISES- RANGE OF MOTION (ROM) AND STRETCHING EXERCISES - Plantar Fasciitis (Heel Spur Syndrome) These exercises  may help you when beginning to rehabilitate your injury. Your symptoms may resolve with or without further involvement from your physician, physical therapist or athletic trainer. While completing these exercises, remember:   Restoring tissue flexibility helps normal motion to return to the joints. This allows healthier, less painful movement and activity.  An effective stretch should be held for at least 30 seconds.  A stretch should never be painful. You should only feel a gentle lengthening or release in the stretched tissue.  RANGE OF MOTION - Toe Extension, Flexion  Sit with your right / left leg crossed over your opposite knee.  Grasp your toes and gently pull them back toward the top of your foot. You should feel a stretch on the bottom of your toes and/or foot.  Hold this stretch for 10 seconds.  Now, gently pull your toes toward the bottom of your foot. You should feel a stretch on the top of your toes and or foot.  Hold this stretch for 10 seconds. Repeat  times. Complete this stretch 3 times per day.   RANGE OF MOTION - Ankle Dorsiflexion, Active Assisted  Remove shoes and sit on a chair that is preferably not on a carpeted surface.  Place right / left foot under knee. Extend your opposite leg for support.  Keeping your heel down, slide your right / left foot back toward the chair until you feel a stretch at your ankle or calf. If you do not feel a stretch, slide your bottom forward to the edge of the chair, while still keeping your heel down.  Hold this stretch for 10 seconds. Repeat 3 times. Complete this stretch 2 times per day.   STRETCH  Gastroc, Standing  Place hands on wall.  Extend right / left leg, keeping the front knee somewhat bent.  Slightly point your toes inward on your back foot.  Keeping your right / left heel on the floor and your knee straight, shift your weight toward the wall, not allowing your back to arch.  You should feel a gentle stretch  in the right / left calf. Hold this position for 10 seconds. Repeat 3 times. Complete this stretch 2 times per day.  STRETCH  Soleus, Standing  Place hands on wall.  Extend right / left leg, keeping the other knee somewhat bent.  Slightly point your toes inward on your back foot.  Keep your right / left heel on the floor, bend your back knee, and slightly shift your weight over the back leg so that you feel a gentle stretch deep in your back calf.  Hold this position for 10 seconds. Repeat 3 times. Complete this stretch 2 times per day.  STRETCH  Gastrocsoleus, Standing  Note: This exercise can place a lot of stress on your foot and ankle. Please complete this exercise only if specifically instructed by your caregiver.   Place the ball of your right / left foot on a step, keeping your other foot firmly on the same step.  Hold on to the wall or a rail for balance.  Slowly lift your other foot, allowing your body weight to press your heel down over the edge of the step.  You should feel a stretch in your right / left calf.  Hold this position for 10 seconds.  Repeat this exercise with a slight bend in your right /   left knee. Repeat 3 times. Complete this stretch 2 times per day.   STRENGTHENING EXERCISES - Plantar Fasciitis (Heel Spur Syndrome)  These exercises may help you when beginning to rehabilitate your injury. They may resolve your symptoms with or without further involvement from your physician, physical therapist or athletic trainer. While completing these exercises, remember:   Muscles can gain both the endurance and the strength needed for everyday activities through controlled exercises.  Complete these exercises as instructed by your physician, physical therapist or athletic trainer. Progress the resistance and repetitions only as guided.  STRENGTH - Towel Curls  Sit in a chair positioned on a non-carpeted surface.  Place your foot on a towel, keeping your heel  on the floor.  Pull the towel toward your heel by only curling your toes. Keep your heel on the floor. Repeat 3 times. Complete this exercise 2 times per day.  STRENGTH - Ankle Inversion  Secure one end of a rubber exercise band/tubing to a fixed object (table, pole). Loop the other end around your foot just before your toes.  Place your fists between your knees. This will focus your strengthening at your ankle.  Slowly, pull your big toe up and in, making sure the band/tubing is positioned to resist the entire motion.  Hold this position for 10 seconds.  Have your muscles resist the band/tubing as it slowly pulls your foot back to the starting position. Repeat 3 times. Complete this exercises 2 times per day.  Document Released: 01/01/2005 Document Revised: 03/26/2011 Document Reviewed: 04/15/2008 [facility] Patient Information 2014 [place], [place].  STRAPPING INSTRUCTIONS  Strapping's need to be worn for 5 days for maximum benefit.  If you next appointment is before 5 days, please remove prior to coming in.  Try to avoid putting lotion on feet during the taping process.  The tape will not stick as well and will not be as effective.  If you were given stretching exercises, start these after removal of the tape.  These exercises will actually loosen the tape and you may not receive full benefit of the strapping.  It is important that you wear sturdy shoes at all times when walking.  Bedroom shoes, slippers, flip flops, etc. are not acceptable.  **If at any time while wearing the strapping you should notice any irritation such as a rash, redness, or itching, remove the tape and wash your foot/feet thoroughly.  BATHING INSTRUCTIONS  Take a washcloth, fold it a few times and tape it around your ankle.   Then take a garbage bag, place it over your foot and angle and tape it above and below the washcloth.  TAKE A QUICK SHOWER.  The washcloth should absorb any water that runs down into the  garbage bag.  If your foot gets wet, take a towel and absorb as much of the water as possible. You can also take a blow dryer, put it on low heat, and dry your bandage.

## 2019-03-17 NOTE — Progress Notes (Signed)
Subjective: Kara Hall is a 49 y.o. female patient returns office for follow-up evaluation of Right heel. Reports she has been dealing with a flare up since returning back to work on February 1.  Reports that when she went back to work her right foot started to become very sore and now the pain is excruciating severe at times causing pain with extensive walking and standing and feeling some popping sensation along the lateral aspect of the foot when she flexes the foot and experiences swelling especially at the end of the day.  Patient has a picture on her phone from February 23 with there is a little bit of swelling at the top of the right foot as compared to the left after a long days of work reports that she has tried wearing orthotics resting icing elevating but still has pain and swelling worse with work.  Patient admits that she was out of work initially after having her hysterectomy but now has been back with pain on the right foot.  Patient denies any other new problems or any other acute issues. No other pedal complaints.   Patient Active Problem List   Diagnosis Date Noted  . Status post hysterectomy 12/18/2018  . Prolonged Q-T interval on ECG 11/24/2018  . Essential hypertension, benign 11/24/2018  . Dyspareunia in female 03/24/2018  . Increased body mass index 03/24/2018  . Carpal tunnel syndrome on both sides 12/10/2017  . Achilles tendon contracture, left 10/04/2016  . Displaced fracture of fifth metatarsal bone of left foot with delayed healing 05/07/2016  . Adjustment disorder with mixed anxiety and depressed mood 11/28/2015  . Bilateral wrist pain 11/28/2015  . History of thyroid cancer 08/09/2013  . Anxiety 01/12/2013    Current Outpatient Medications on File Prior to Visit  Medication Sig Dispense Refill  . ALPRAZolam (XANAX) 0.5 MG tablet Take 0.5-1 tablets (0.25-0.5 mg total) by mouth daily as needed for anxiety. 30 tablet 0  . amLODipine (NORVASC) 10 MG tablet  Take 1 tablet by mouth once daily 90 tablet 0  . diclofenac (VOLTAREN) 75 MG EC tablet Take 1 tablet (75 mg total) by mouth 2 (two) times daily. 30 tablet 0  . docusate sodium (COLACE) 50 MG capsule Take 1 capsule (50 mg total) by mouth 2 (two) times daily. 30 capsule 1  . ibuprofen (ADVIL) 800 MG tablet Take 1 tablet (800 mg total) by mouth every 8 (eight) hours as needed. 30 tablet 1  . levothyroxine (SYNTHROID, LEVOTHROID) 125 MCG tablet Take 125 mcg by mouth daily before breakfast.     . methocarbamol (ROBAXIN) 500 MG tablet Take 1 tablet (500 mg total) by mouth 4 (four) times daily. 60 tablet 0  . oxyCODONE-acetaminophen (PERCOCET) 5-325 MG tablet Take 1 tablet by mouth every 6 (six) hours as needed for severe pain. 20 tablet 0  . oxyCODONE-acetaminophen (PERCOCET) 5-325 MG tablet Take 1 tablet by mouth every 6 (six) hours as needed for severe pain. 15 tablet 0  . predniSONE (DELTASONE) 20 MG tablet Take 40 mg by mouth daily.    . sertraline (ZOLOFT) 25 MG tablet Take 1 tablet (25 mg total) by mouth daily. 90 tablet 0  . simethicone (GAS-X) 80 MG chewable tablet Chew 1 tablet (80 mg total) by mouth every 6 (six) hours as needed for flatulence. 30 tablet 1   No current facility-administered medications on file prior to visit.    Allergies  Allergen Reactions  . Other     BLOOD REFUSAL   .  Sulfa Antibiotics Other (See Comments) and Nausea And Vomiting    Severe bladder, and kidney infection  . Trazodone And Nefazodone Other (See Comments)    Headache every am     Objective: Physical Exam General: The patient is alert and oriented x3 in no acute distress.  Dermatology: Skin is warm, dry and supple bilateral lower extremities. Nails 1-10 are normal. There is no erythema, edema, no eccymosis, no open lesions present. Integument is otherwise unremarkable.  Vascular: Dorsalis Pedis pulse and Posterior Tibial pulse are 2/4 bilateral. Capillary fill time is immediate to all  digits.  Neurological: Grossly intact to light touch with an achilles reflex of +2/5 and a negative Tinel's sign bilateral.  Musculoskeletal: Tenderness to palpation at the medial calcaneal tubercale and through the insertion of the plantar fascia on the right with mild lateral foot and ankle pain along the peroneal tendon on right from the insertion of the peroneus brevis tendon to the lateral ankle. There is no pain with compression of calcaneus bilateral. No pain with tuning fork to calcaneus bilateral. No pain with calf compression bilateral. There is decreased Ankle joint range of motion bilateral. All other joints range of motion within normal limits bilateral. Strength 5/5 in all groups bilateral.   Assessment and Plan: Problem List Items Addressed This Visit    None    Visit Diagnoses    Plantar fasciitis of right foot    -  Primary   Relevant Orders   DG Foot Complete Left   Tendinitis       Inflammatory pain of right heel         -Complete examination performed.  -Re-Discussed with patient in detail the condition of plantar fasciitis right with tendonitis  -After oral consent and aseptic prep, injected a mixture containing 1 ml of 2%  plain lidocaine, 1 ml 0.5% plain marcaine, 0.5 ml of kenalog 10 and 0.5 ml of dexamethasone phosphate into R plantar fascia without complication. Post-injection care discussed with patient.  -Applied plantar fascial taping and dispensed a plantar fascial brace for patient to use advised patient after the taping is removed may continue with plantar fascial brace -Advised patient at this time to discontinue use of orthotics and we will have them re-evaluated at a later time to see if there are any adjustments that need to be made to help with her pain and symptoms -For this time only prescribed tramadol for patient's severe pain and advised patient if pain continues may benefit from considering MRI, physical therapy, EPAT or surgery -Patient to return  to office in 3 to 4 weeks for follow-up evaluation or sooner if problems or issues arise.  Landis Martins, DPM

## 2019-03-30 ENCOUNTER — Encounter: Payer: Self-pay | Admitting: Family Medicine

## 2019-03-30 ENCOUNTER — Other Ambulatory Visit: Payer: Self-pay

## 2019-03-30 ENCOUNTER — Ambulatory Visit: Payer: BC Managed Care – PPO | Admitting: Family Medicine

## 2019-03-30 VITALS — BP 127/86 | HR 98 | Temp 98.8°F | Ht 66.0 in | Wt 196.0 lb

## 2019-03-30 DIAGNOSIS — M546 Pain in thoracic spine: Secondary | ICD-10-CM

## 2019-03-30 DIAGNOSIS — Z85850 Personal history of malignant neoplasm of thyroid: Secondary | ICD-10-CM

## 2019-03-30 DIAGNOSIS — I10 Essential (primary) hypertension: Secondary | ICD-10-CM | POA: Diagnosis not present

## 2019-03-30 DIAGNOSIS — F4323 Adjustment disorder with mixed anxiety and depressed mood: Secondary | ICD-10-CM | POA: Diagnosis not present

## 2019-03-30 MED ORDER — SERTRALINE HCL 25 MG PO TABS: 25.0000 mg | ORAL_TABLET | Freq: Every day | ORAL | 1 refills | Status: DC

## 2019-03-30 MED ORDER — ALPRAZOLAM 0.5 MG PO TABS: 0.2500 mg | ORAL_TABLET | Freq: Every day | ORAL | 1 refills | Status: DC | PRN

## 2019-03-30 MED ORDER — AMLODIPINE BESYLATE 10 MG PO TABS: 10.0000 mg | ORAL_TABLET | Freq: Every day | ORAL | 1 refills | Status: DC

## 2019-03-30 NOTE — Patient Instructions (Signed)
° ° ° °  If you have lab work done today you will be contacted with your lab results within the next 2 weeks.  If you have not heard from us then please contact us. The fastest way to get your results is to register for My Chart. ° ° °IF you received an x-ray today, you will receive an invoice from [facility]. Please contact [facility] at [phone] with questions or concerns regarding your invoice.  ° °IF you received labwork today, you will receive an invoice from [facility]. Please contact [facility] at [phone] with questions or concerns regarding your invoice.  ° °Our billing staff will not be able to assist you with questions regarding bills from these companies. ° °You will be contacted with the lab results as soon as they are available. The fastest way to get your results is to activate your My Chart account. Instructions are located on the last page of this paperwork. If you have not heard from us regarding the results in 2 weeks, please contact this office. °  ° ° ° °

## 2019-03-30 NOTE — Progress Notes (Signed)
3/15/20214:01 PM  Kara Hall June 25, 1970, 49 y.o., female [id number]  Chief Complaint  Patient presents with  . pulled muscle in left / right upper back  . Depression    lost job today     HPI:   Patient is a 49 y.o. female with past medical history significant for  hypothryodism s/p thyroid cancer, GAD, chronic pain, RLE DVT, migraines who presents today for routine followup  Patient distressed today as she lost her job today Asking for refills of meds to last until she gets insurance  pmp reviewed, last alprazolam refilled dec 2020 Given tramadol for plantar fascitis/tenditis by podiatry  Has been having burning pain left mid back, under her scapula It has radiated to her right side Methocarbamol helps but makes her sleepy  Patient Care Team: Rutherford Guys, MD as PCP - General (Family Medicine) Delrae Rend, MD as Consulting Physician (Endocrinology) Jonelle Sidle, MD as Consulting Physician (Obstetrics and Gynecology) Allyn Kenner, DO as Consulting Physician (Obstetrics and Gynecology)  Lab Results  Component Value Date   CREATININE 0.73 12/19/2018   BUN 16 12/19/2018   NA 134 (L) 12/19/2018   K 3.9 12/19/2018   CL 100 12/19/2018   CO2 22 12/19/2018    Depression screen PHQ 2/9 03/30/2019 03/30/2019 03/09/2019  Decreased Interest 1 0 0  Down, Depressed, Hopeless 1 0 0  PHQ - 2 Score 2 0 0  Altered sleeping 1 - -  Tired, decreased energy 2 - -  Change in appetite 1 - -  Feeling bad or failure about yourself  1 - -  Trouble concentrating 0 - -  Moving slowly or fidgety/restless 0 - -  Suicidal thoughts 0 - -  PHQ-9 Score 7 - -  Difficult doing work/chores Somewhat difficult - -  Some recent data might be hidden    Fall Risk  03/30/2019 03/09/2019 01/05/2019 06/26/2018 05/26/2018  Falls in the past year? 0 0 0 0 0  Number falls in past yr: 0 0 0 0 0  Comment - - - - -  Injury with Fall? 0 0 0 0 1  Comment - - - - -  Follow up  Falls evaluation completed Falls evaluation completed - - -     Allergies  Allergen Reactions  . Other     BLOOD REFUSAL   . Sulfa Antibiotics Other (See Comments) and Nausea And Vomiting    Severe bladder, and kidney infection  . Trazodone And Nefazodone Other (See Comments)    Headache every am     Prior to Admission medications   Medication Sig Start Date End Date Taking? Authorizing Provider  ALPRAZolam [name]) 0.5 MG tablet Take 0.5-1 tablets (0.25-0.5 mg total) by mouth daily as needed for anxiety. 01/05/19  Yes Rutherford Guys, MD  amLODipine (NORVASC) 10 MG tablet Take 1 tablet by mouth once daily 02/03/19  Yes Rutherford Guys, MD  diclofenac (VOLTAREN) 75 MG EC tablet Take 1 tablet (75 mg total) by mouth 2 (two) times daily. 03/09/19  Yes Maximiano Coss, NP  levothyroxine (SYNTHROID, LEVOTHROID) 125 MCG tablet Take 125 mcg by mouth daily before breakfast.    Yes [provider]  methocarbamol (ROBAXIN) 500 MG tablet Take 1 tablet (500 mg total) by mouth 4 (four) times daily. 03/09/19  Yes Maximiano Coss, NP  sertraline (ZOLOFT) 25 MG tablet Take 1 tablet (25 mg total) by mouth daily. 01/20/19  Yes Rutherford Guys, MD    Past Medical History:  Diagnosis Date  . Anemia   . Anxiety    panic attack, also reports problems with depression, relative to her job & upcoming surgery , relative to life problems - with children & spouse    . Back complaints 2013   has had steroid injection   . Depression   . DVT (deep venous thrombosis) ([place]) 2001   RLE  . Fibroids   . Foot fracture, left   . HTN (hypertension)   . Migraine    h/o migraines - as a teenager  & again now with family issues, states she has to lay down & rest in a dark room  (08/28/2013)  . Muscle strain of hand    wearing home made splint today, L hand  . Peripheral vascular disease (HCC)    DVT- per pt. in 2001- had vein removed.   . Refusal of blood transfusions as patient is Jehovah's Witness   .  Thyroid cancer ([place])   . Thyroid nodule     Past Surgical History:  Procedure Laterality Date  . CYSTOSCOPY N/A 12/18/2018   Procedure: CYSTOSCOPY;  Surgeon: Jonelle Sidle, MD;  Location: [facility];  Service: Gynecology;  Laterality: N/A;  . LAPAROSCOPIC LYSIS OF ADHESIONS N/A 12/18/2018   Procedure: LAPAROSCOPIC LYSIS OF ADHESIONS;  Surgeon: Jonelle Sidle, MD;  Location: [facility];  Service: Gynecology;  Laterality: N/A;  . ROBOTIC ASSISTED LAPAROSCOPIC HYSTERECTOMY AND SALPINGECTOMY Bilateral 12/18/2018   Procedure: XI ROBOTIC ASSISTED LAPAROSCOPIC HYSTERECTOMY AND BILATERAL SALPINGECTOMY;  Surgeon: Jonelle Sidle, MD;  Location: [facility];  Service: Gynecology;  Laterality: Bilateral;  . THROMBECTOMY Right 2001   leg; after 3rd child was born; for deep vein thrombosis  . THYROIDECTOMY Right 08/28/2013   Procedure: RIGHT THYROID LOBECTOMY POSSIBLE TOTAL THYROIDECTOMY;  Surgeon: Jodi Marble, MD;  Location: [facility];  Service: ENT;  Laterality: Right;  . THYROIDECTOMY Left 09/10/2013   Procedure: COMPLETE LEFT THYROIDECTOMY;  Surgeon: Jodi Marble, MD;  Location: [facility];  Service: ENT;  Laterality: Left;  . TUBAL LIGATION  2001    Social History   Tobacco Use  . Smoking status: Never Smoker  . Smokeless tobacco: Never Used  Substance Use Topics  . Alcohol use: Yes    Comment: Cocktail or glass of wine occasionally    Family History  Problem Relation Age of Onset  . Heart disease Mother   . Hyperlipidemia Mother   . Heart disease Father   . Hyperlipidemia Father   . Diabetes Father   . Heart disease Brother   . Hyperlipidemia Brother   . Hypertension Brother   . Heart disease Brother   . Hyperlipidemia Brother     Review of Systems  Constitutional: Negative for chills and fever.  Respiratory: Negative for cough and shortness of breath.   Cardiovascular: Negative for chest pain, palpitations and  leg swelling.  Gastrointestinal: Negative for abdominal pain, nausea and vomiting.  per hpi   OBJECTIVE:  Today's Vitals   03/30/19 1548  BP: 127/86  Pulse: 98  Temp: 98.8 F (37.1 C)  SpO2: 97%  Weight: 196 lb (88.9 kg)  Height: 5\' 6"  (1.676 m)   Body mass index is 31.64 kg/m.   Physical Exam Vitals and nursing note reviewed.  Constitutional:      Appearance: She is well-developed.  HENT:     Head: Normocephalic and atraumatic.     Mouth/Throat:     Pharynx: No oropharyngeal exudate.  Eyes:  General: No scleral icterus.    Conjunctiva/sclera: Conjunctivae normal.     Pupils: Pupils are equal, round, and reactive to light.  Cardiovascular:     Rate and Rhythm: Normal rate and regular rhythm.     Heart sounds: Normal heart sounds. No murmur. No friction rub. No gallop.   Pulmonary:     Effort: Pulmonary effort is normal.     Breath sounds: Normal breath sounds. No wheezing, rhonchi or rales.  Musculoskeletal:     Cervical back: Neck supple.  Skin:    General: Skin is warm and dry.  Neurological:     Mental Status: She is alert and oriented to person, place, and time.     No results found for this or any previous visit (from the past 24 hour(s)).  No results found.   ASSESSMENT and PLAN  1. Essential hypertension, benign Controlled. Continue current regime.   2. Adjustment disorder with mixed anxiety and depressed mood Stable. Continue current regime. pmp reviewed - ALPRAZolam (XANAX) 0.5 MG tablet; Take 0.5-1 tablets (0.25-0.5 mg total) by mouth daily as needed for anxiety.  3. History of thyroid cancer Managed by endo  4. Acute bilateral thoracic back pain Cut methocarbamol in half, continued with supportive measures  Other orders - amLODipine (NORVASC) 10 MG tablet; Take 1 tablet (10 mg total) by mouth daily. - sertraline (ZOLOFT) 25 MG tablet; Take 1 tablet (25 mg total) by mouth daily.  No follow-ups on file.    Rutherford Guys,  MD [facility] [place], [place] [address] Ph.  [phone] Fax [phone]

## 2019-03-31 DIAGNOSIS — Z7189 Other specified counseling: Secondary | ICD-10-CM | POA: Diagnosis not present

## 2019-03-31 DIAGNOSIS — E890 Postprocedural hypothyroidism: Secondary | ICD-10-CM | POA: Diagnosis not present

## 2019-03-31 DIAGNOSIS — Z85850 Personal history of malignant neoplasm of thyroid: Secondary | ICD-10-CM | POA: Diagnosis not present

## 2019-04-07 ENCOUNTER — Other Ambulatory Visit: Payer: Self-pay | Admitting: Registered Nurse

## 2019-04-07 DIAGNOSIS — M25512 Pain in left shoulder: Secondary | ICD-10-CM

## 2019-04-07 NOTE — Telephone Encounter (Signed)
Patient is requesting a refill of the following medications: Requested Prescriptions   Pending Prescriptions Disp Refills  . methocarbamol (ROBAXIN) 500 MG tablet [Pharmacy Med Name: Methocarbamol 500 MG Oral Tablet] 60 tablet 0    Sig: Take 1 tablet by mouth 4 times daily    Date of patient request: 04/07/2019 Last office visit: 03/30/2019 Date of last refill: 03/09/2019 Last refill amount: 60 tablets  Follow up time period per chart: follow up scheduled

## 2019-04-14 ENCOUNTER — Ambulatory Visit: Payer: BC Managed Care – PPO | Admitting: Sports Medicine

## 2019-04-21 ENCOUNTER — Telehealth: Payer: Self-pay

## 2019-04-21 NOTE — Telephone Encounter (Signed)
no action needed

## 2019-06-08 ENCOUNTER — Other Ambulatory Visit: Payer: Self-pay | Admitting: Sports Medicine

## 2019-06-08 ENCOUNTER — Telehealth: Payer: Self-pay | Admitting: *Deleted

## 2019-06-08 NOTE — Telephone Encounter (Signed)
Dr. Stover please advice 

## 2019-06-08 NOTE — Telephone Encounter (Signed)
Pt states she has been struggling with foot pain for 2 years, lost her job and insurance and now is working Physiological scientist and still does not have insurance yet, but feet are swelling and pain all the time and she would like a refill of medication.

## 2019-06-08 NOTE — Telephone Encounter (Signed)
I refilled her Tramadol medication earlier today for this time only. -Dr. Chauncey Cruel

## 2019-07-18 ENCOUNTER — Emergency Department (HOSPITAL_BASED_OUTPATIENT_CLINIC_OR_DEPARTMENT_OTHER)
Admission: EM | Admit: 2019-07-18 | Discharge: 2019-07-18 | Disposition: A | Discharge status: Home / Self Care | Payer: Self-pay | Attending: Emergency Medicine | Admitting: Emergency Medicine

## 2019-07-18 ENCOUNTER — Encounter (HOSPITAL_BASED_OUTPATIENT_CLINIC_OR_DEPARTMENT_OTHER): Payer: Self-pay | Admitting: Emergency Medicine

## 2019-07-18 ENCOUNTER — Emergency Department (HOSPITAL_BASED_OUTPATIENT_CLINIC_OR_DEPARTMENT_OTHER): Payer: Self-pay

## 2019-07-18 DIAGNOSIS — Z79899 Other long term (current) drug therapy: Secondary | ICD-10-CM | POA: Insufficient documentation

## 2019-07-18 DIAGNOSIS — N201 Calculus of ureter: Secondary | ICD-10-CM

## 2019-07-18 DIAGNOSIS — N135 Crossing vessel and stricture of ureter without hydronephrosis: Secondary | ICD-10-CM | POA: Insufficient documentation

## 2019-07-18 DIAGNOSIS — I10 Essential (primary) hypertension: Secondary | ICD-10-CM | POA: Insufficient documentation

## 2019-07-18 LAB — URINALYSIS, ROUTINE W REFLEX MICROSCOPIC
Bilirubin Urine: NEGATIVE
Glucose, UA: 100 mg/dL — AB
Ketones, ur: NEGATIVE mg/dL
Leukocytes,Ua: NEGATIVE
Nitrite: NEGATIVE
Protein, ur: NEGATIVE mg/dL
Specific Gravity, Urine: 1.025 (ref 1.005–1.030)
pH: 7 (ref 5.0–8.0)

## 2019-07-18 LAB — URINALYSIS, MICROSCOPIC (REFLEX)

## 2019-07-18 MED ORDER — IBUPROFEN 600 MG PO TABS: 600.0000 mg | ORAL_TABLET | Freq: Four times a day (QID) | ORAL | 0 refills | Status: DC | PRN

## 2019-07-18 MED ORDER — KETOROLAC TROMETHAMINE 60 MG/2ML IM SOLN
30.0000 mg | Freq: Once | INTRAMUSCULAR | Status: AC
  Administered 2019-07-18: 30 mg via INTRAMUSCULAR
  Filled 2019-07-18: qty 2

## 2019-07-18 MED ORDER — TAMSULOSIN HCL 0.4 MG PO CAPS
0.4000 mg | ORAL_CAPSULE | Freq: Once | ORAL | Status: AC
  Administered 2019-07-18: 0.4 mg via ORAL
  Filled 2019-07-18: qty 1

## 2019-07-18 MED ORDER — TAMSULOSIN HCL 0.4 MG PO CAPS: 0.4000 mg | ORAL_CAPSULE | Freq: Every day | ORAL | 0 refills | Status: AC

## 2019-07-18 NOTE — ED Provider Notes (Signed)
[facility] EMERGENCY DEPARTMENT Provider Note  CSN: [id number] Arrival date & time: 07/18/19 0413  Chief Complaint(s) Back Pain  HPI Kara Hall is a 49 y.o. female   CC: Back pain  Onset/Duration: Several months usually at night Timing: Intermittent, constant for 1 week Location: Left low back/flank Quality: Aching stabbing Severity: Severe Modifying Factors:  Improved by: Voiding  Worsened by: Nothing Associated Signs/Symptoms:  Pertinent (+): Urgency/frequency, nausea  Pertinent (-): Fevers, chills, emesis, chest pain, shortness of breath, abdominal pain, trauma  No prior h/o renal stone, but there is a FHx.  HPI  Past Medical History Past Medical History:  Diagnosis Date  . Anemia   . Anxiety    panic attack, also reports problems with depression, relative to her job & upcoming surgery , relative to life problems - with children & spouse    . Back complaints 2013   has had steroid injection   . Depression   . DVT (deep venous thrombosis) ([place]) 2001   RLE  . Fibroids   . Foot fracture, left   . HTN (hypertension)   . Migraine    h/o migraines - as a teenager  & again now with family issues, states she has to lay down & rest in a dark room  (08/28/2013)  . Muscle strain of hand    wearing home made splint today, L hand  . Peripheral vascular disease (HCC)    DVT- per pt. in 2001- had vein removed.   . Refusal of blood transfusions as patient is Jehovah's Witness   . Thyroid cancer ([place])   . Thyroid nodule    Patient Active Problem List   Diagnosis Date Noted  . Status post hysterectomy 12/18/2018  . Prolonged Q-T interval on ECG 11/24/2018  . Essential hypertension, benign 11/24/2018  . Irregular periods 10/08/2018  . Leiomyoma 10/08/2018  . Dyspareunia in female 03/24/2018  . Increased body mass index 03/24/2018  . Carpal tunnel syndrome on both sides 12/10/2017  . Achilles tendon contracture, left 10/04/2016  . Displaced fracture of  fifth metatarsal bone of left foot with delayed healing 05/07/2016  . Bilateral wrist pain 11/28/2015  . History of thyroid cancer 08/09/2013  . Anxiety 01/12/2013   Home Medication(s) Prior to Admission medications   Medication Sig Start Date End Date Taking? Authorizing Provider  ALPRAZolam [name]) 0.5 MG tablet Take 0.5-1 tablets (0.25-0.5 mg total) by mouth daily as needed for anxiety. 03/30/19   Rutherford Guys, MD  amLODipine (NORVASC) 10 MG tablet Take 1 tablet (10 mg total) by mouth daily. 03/30/19   Rutherford Guys, MD  diclofenac (VOLTAREN) 75 MG EC tablet Take 1 tablet (75 mg total) by mouth 2 (two) times daily. 03/09/19   Maximiano Coss, NP  ibuprofen (ADVIL) 600 MG tablet Take 1 tablet (600 mg total) by mouth every 6 (six) hours as needed. 07/18/19   Stepfanie Yott, Grayce Sessions, MD  levothyroxine (SYNTHROID, LEVOTHROID) 125 MCG tablet Take 125 mcg by mouth daily before breakfast.     [provider]  methocarbamol (ROBAXIN) 500 MG tablet Take 1 tablet by mouth 4 times daily 04/07/19   Maximiano Coss, NP  sertraline (ZOLOFT) 25 MG tablet Take 1 tablet (25 mg total) by mouth daily. 03/30/19   Rutherford Guys, MD  tamsulosin (FLOMAX) 0.4 MG CAPS capsule Take 1 capsule (0.4 mg total) by mouth daily for 7 days. 07/19/19 07/26/19  Fatima Blank, MD  traMADol (ULTRAM) 50 MG tablet TAKE 1 TABLET  BY MOUTH EVERY 8 HOURS AS NEEDED FOR 5 DAYS 06/08/19   Landis Martins, DPM                                                                                                                                    Past Surgical History Past Surgical History:  Procedure Laterality Date  . CYSTOSCOPY N/A 12/18/2018   Procedure: CYSTOSCOPY;  Surgeon: Jonelle Sidle, MD;  Location: [facility];  Service: Gynecology;  Laterality: N/A;  . LAPAROSCOPIC LYSIS OF ADHESIONS N/A 12/18/2018   Procedure: LAPAROSCOPIC LYSIS OF ADHESIONS;  Surgeon: Jonelle Sidle, MD;  Location:  [facility];  Service: Gynecology;  Laterality: N/A;  . ROBOTIC ASSISTED LAPAROSCOPIC HYSTERECTOMY AND SALPINGECTOMY Bilateral 12/18/2018   Procedure: XI ROBOTIC ASSISTED LAPAROSCOPIC HYSTERECTOMY AND BILATERAL SALPINGECTOMY;  Surgeon: Jonelle Sidle, MD;  Location: [facility];  Service: Gynecology;  Laterality: Bilateral;  . THROMBECTOMY Right 2001   leg; after 3rd child was born; for deep vein thrombosis  . THYROIDECTOMY Right 08/28/2013   Procedure: RIGHT THYROID LOBECTOMY POSSIBLE TOTAL THYROIDECTOMY;  Surgeon: Jodi Marble, MD;  Location: [facility];  Service: ENT;  Laterality: Right;  . THYROIDECTOMY Left 09/10/2013   Procedure: COMPLETE LEFT THYROIDECTOMY;  Surgeon: Jodi Marble, MD;  Location: [facility];  Service: ENT;  Laterality: Left;  . TUBAL LIGATION  2001   Family History Family History  Problem Relation Age of Onset  . Heart disease Mother   . Hyperlipidemia Mother   . Heart disease Father   . Hyperlipidemia Father   . Diabetes Father   . Heart disease Brother   . Hyperlipidemia Brother   . Hypertension Brother   . Heart disease Brother   . Hyperlipidemia Brother     Social History Social History   Tobacco Use  . Smoking status: Never Smoker  . Smokeless tobacco: Never Used  Vaping Use  . Vaping Use: Never used  Substance Use Topics  . Alcohol use: Yes    Comment: occ  . Drug use: No   Allergies Other, Sulfa antibiotics, and Trazodone and nefazodone  Review of Systems Review of Systems All other systems are reviewed and are negative for acute change except as noted in the HPI  Physical Exam Vital Signs  I have reviewed the triage vital signs BP (!) 133/95 (BP Location: Right Arm)   Pulse 95   Temp 98.1 F (36.7 C) (Oral)   Resp (!) 24   Ht 5\' 6"  (1.676 m)   Wt 86.2 kg   LMP 01/08/2019   SpO2 97%   BMI 30.67 kg/m   Physical Exam Vitals reviewed.  Constitutional:      General: She is not in acute distress.     Appearance: She is well-developed. She is not diaphoretic.  HENT:     Head: Normocephalic and atraumatic.     Right Ear: External ear normal.  Left Ear: External ear normal.     Nose: Nose normal.  Eyes:     General: No scleral icterus.    Conjunctiva/sclera: Conjunctivae normal.  Neck:     Trachea: Phonation normal.  Cardiovascular:     Rate and Rhythm: Normal rate and regular rhythm.  Pulmonary:     Effort: Pulmonary effort is normal. No respiratory distress.     Breath sounds: No stridor.  Abdominal:     General: There is no distension.     Tenderness: There is no abdominal tenderness. There is no right CVA tenderness or left CVA tenderness.  Musculoskeletal:        General: Normal range of motion.     Cervical back: Normal range of motion.  Neurological:     Mental Status: She is alert and oriented to person, place, and time.  Psychiatric:        Behavior: Behavior normal.     ED Results and Treatments Labs (all labs ordered are listed, but only abnormal results are displayed) Labs Reviewed  URINALYSIS, ROUTINE W REFLEX MICROSCOPIC - Abnormal; Notable for the following components:      Result Value   APPearance HAZY (*)    Glucose, UA 100 (*)    Hgb urine dipstick SMALL (*)    All other components within normal limits  URINALYSIS, MICROSCOPIC (REFLEX) - Abnormal; Notable for the following components:   Bacteria, UA MANY (*)    All other components within normal limits                                                                                                                         EKG  EKG Interpretation  Date/Time:    Ventricular Rate:    PR Interval:    QRS Duration:   QT Interval:    QTC Calculation:   R Axis:     Text Interpretation:        Radiology CT Renal Stone Study  Result Date: 07/18/2019 CLINICAL DATA:  49 year old female with history of flank pain. Suspected kidney stone. EXAM: CT ABDOMEN AND PELVIS WITHOUT CONTRAST TECHNIQUE:  Multidetector CT imaging of the abdomen and pelvis was performed following the standard protocol without IV contrast. COMPARISON:  No priors. FINDINGS: Lower chest: Unremarkable. Hepatobiliary: No suspicious cystic or solid hepatic lesions are confidently identified on today's noncontrast CT examination. Unenhanced appearance of the gallbladder is normal. Pancreas: No definite pancreatic mass or peripancreatic fluid collections or inflammatory changes on today's noncontrast CT examination. Spleen: Unremarkable. Adrenals/Urinary Tract: At the left ureterovesicular junction (axial image 76 of series 2) there is a 2 mm calculus which is associated with mild proximal left hydroureteronephrosis. No additional calculi are noted within the collecting system of either kidney, along the course of either ureter or elsewhere in the urinary bladder. Unenhanced appearance of the kidneys and bilateral adrenal glands is otherwise normal. Urinary bladder is otherwise normal in appearance. Stomach/Bowel: Unenhanced appearance of the stomach is normal. No pathologic  dilatation of small bowel or colon. A few scattered colonic diverticulae are noted, without surrounding inflammatory changes to suggest an acute diverticulitis at this time. Normal appendix. Vascular/Lymphatic: Aortic atherosclerosis. Numerous venous collaterals are noted in the anterior abdominal wall. No lymphadenopathy noted in the abdomen or pelvis. Reproductive: Status post hysterectomy. Ovaries are unremarkable in appearance. Other: Small periumbilical ventral hernia containing a small amount of omental fat. No significant volume of ascites. No pneumoperitoneum. Musculoskeletal: There are no aggressive appearing lytic or blastic lesions noted in the visualized portions of the skeleton. IMPRESSION: 1. 2 mm calculus at the left ureterovesicular junction with mild proximal left hydroureteronephrosis indicative of obstruction. 2. Mild colonic diverticulosis without  evidence of acute diverticulitis at this time. 3. Small periumbilical ventral hernia containing a small amount of omental fat. 4. Aortic atherosclerosis. Electronically Signed   By: Vinnie Langton M.D.   On: 07/18/2019 06:19    Pertinent labs & imaging results that were available during my care of the patient were reviewed by me and considered in my medical decision making (see chart for details).  Medications Ordered in ED Medications  tamsulosin (FLOMAX) capsule 0.4 mg (has no administration in time range)  ketorolac (TORADOL) injection 30 mg (30 mg Intramuscular Given 07/18/19 0450)                                                                                                                                    Procedures Procedures  (including critical care time)  Medical Decision Making / ED Course I have reviewed the nursing notes for this encounter and the patient's prior records (if available in EHR or on provided paperwork).   Kara Hall was evaluated in Emergency Department on 07/18/2019 for the symptoms described in the history of present illness. She was evaluated in the context of the global COVID-19 pandemic, which necessitated consideration that the patient might be at risk for infection with the SARS-CoV-2 virus that causes COVID-19. Institutional protocols and algorithms that pertain to the evaluation of patients at risk for COVID-19 are in a state of rapid change based on information released by regulatory bodies including the CDC and federal and state organizations. These policies and algorithms were followed during the patient's care in the ED.  Left flank pain. Considering renal stone given presentation. Will assess for UTI/pyelonephritis  UA negative for infection; positive for hematuria CT stone study showed a 2 mm left UVJ stone  Pain controlled with IM Toradol      Final Clinical Impression(s) / ED Diagnoses Final diagnoses:  Obstruction of left  ureteropelvic junction (UPJ) due to stone    The patient appears reasonably screened and/or stabilized for discharge and I doubt any other medical condition or other [facility] requiring further screening, evaluation, or treatment in the ED at this time prior to discharge. Safe for discharge with strict return precautions.  Disposition: Discharge  Condition: Good  I have discussed  the results, Dx and Tx plan with the patient/family who expressed understanding and agree(s) with the plan. Discharge instructions discussed at length. The patient/family was given strict return precautions who verbalized understanding of the instructions. No further questions at time of discharge.    ED Discharge Orders         Ordered    tamsulosin (FLOMAX) 0.4 MG CAPS capsule  Daily     Discontinue  Reprint     07/18/19 0641    ibuprofen (ADVIL) 600 MG tablet  Every 6 hours PRN     Discontinue  Reprint     07/18/19 1281           Follow Up: Rutherford Guys, MD [address] [place] [address] [phone]  Call  As needed  [place] [phone] Schedule an appointment as soon as possible for a visit  As needed     This chart was dictated using voice recognition software.  Despite best efforts to proofread,  errors can occur which can change the documentation meaning.   Fatima Blank, MD 07/18/19 [phone]

## 2019-07-18 NOTE — ED Notes (Signed)
Patient transported to CT 

## 2019-07-18 NOTE — ED Triage Notes (Signed)
Pt brought in by EMS with c/o back pain  Pt has chronic back pain and tonight states the pain got worse

## 2019-07-21 ENCOUNTER — Ambulatory Visit: Payer: BC Managed Care – PPO | Admitting: Family Medicine

## 2019-08-05 ENCOUNTER — Other Ambulatory Visit: Payer: Self-pay | Admitting: Sports Medicine

## 2019-08-09 ENCOUNTER — Telehealth: Payer: Self-pay | Admitting: Sports Medicine

## 2019-08-10 ENCOUNTER — Other Ambulatory Visit: Payer: Self-pay | Admitting: Sports Medicine

## 2019-08-10 MED ORDER — TRAMADOL HCL 50 MG PO TABS: 50.0000 mg | ORAL_TABLET | Freq: Three times a day (TID) | ORAL | 0 refills | Status: DC | PRN

## 2019-08-10 NOTE — Telephone Encounter (Signed)
Thanks

## 2019-08-10 NOTE — Telephone Encounter (Signed)
I called the other day trying to get my pain Rx refilled by Dr. Cannon Kettle and I know your message says you don't refill pain medication after hours. Its Sunday and I'm not even supposed to work today and the heel of my foot is killing me with excruciating jabs through the heel of my foot on the right and I need my medicine. I can't convey to you how much I need my medicine. If you would just try to tell her, she doesn't come in until Tuesday. Thank you so much. Bye.

## 2019-08-10 NOTE — Telephone Encounter (Signed)
I left a voicemail for pt letting her know you had refilled her pain medication.

## 2019-08-10 NOTE — Telephone Encounter (Signed)
Left voicemail letting pt know pain Rx was sent to [facility] on [facility] and to call with any questions.

## 2019-08-10 NOTE — Progress Notes (Signed)
Sent a refill on pain medication for heel pain

## 2019-08-10 NOTE — Telephone Encounter (Signed)
Refill sent.

## 2019-09-22 ENCOUNTER — Other Ambulatory Visit: Payer: Self-pay | Admitting: Family Medicine

## 2019-09-22 NOTE — Telephone Encounter (Signed)
Patient  experiencing elbow pain, requesting traMADol (ULTRAM) 50 MG tablet, patient declined appointment due to no insurance. Patient states tylenol is not working. Patient would like request expedited.     [place], [place]. Phone:  [phone]  Fax:  [phone]

## 2019-10-08 ENCOUNTER — Ambulatory Visit (INDEPENDENT_AMBULATORY_CARE_PROVIDER_SITE_OTHER): Payer: Self-pay | Admitting: Sports Medicine

## 2019-10-08 ENCOUNTER — Encounter: Payer: Self-pay | Admitting: Sports Medicine

## 2019-10-08 ENCOUNTER — Other Ambulatory Visit: Payer: Self-pay

## 2019-10-08 DIAGNOSIS — M79671 Pain in right foot: Secondary | ICD-10-CM

## 2019-10-08 DIAGNOSIS — M722 Plantar fascial fibromatosis: Secondary | ICD-10-CM

## 2019-10-08 DIAGNOSIS — M779 Enthesopathy, unspecified: Secondary | ICD-10-CM

## 2019-10-08 MED ORDER — TRAMADOL HCL 50 MG PO TABS: 50.0000 mg | ORAL_TABLET | Freq: Three times a day (TID) | ORAL | 0 refills | Status: DC | PRN

## 2019-10-08 MED ORDER — TRIAMCINOLONE ACETONIDE 10 MG/ML IJ SUSP: 10.0000 mg | Freq: Once | INTRAMUSCULAR | Status: AC

## 2019-10-08 NOTE — Progress Notes (Signed)
Subjective: Kara Hall is a 49 y.o. female patient returns office for follow-up evaluation of Right heel. 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  Patient denies any other new problems or any other acute issues. No other pedal complaints.   Patient Active Problem List   Diagnosis Date Noted  . Status post hysterectomy 12/18/2018  . Prolonged Q-T interval on ECG 11/24/2018  . Essential hypertension, benign 11/24/2018  . Irregular periods 10/08/2018  . Leiomyoma 10/08/2018  . Dyspareunia in female 03/24/2018  . Increased body mass index 03/24/2018  . Carpal tunnel syndrome on both sides 12/10/2017  . Achilles tendon contracture, left 10/04/2016  . Displaced fracture of fifth metatarsal bone of left foot with delayed healing 05/07/2016  . Bilateral wrist pain 11/28/2015  . History of thyroid cancer 08/09/2013  . Anxiety 01/12/2013    Current Outpatient Medications on File Prior to Visit  Medication Sig Dispense Refill  . ALPRAZolam (XANAX) 0.5 MG tablet Take 0.5-1 tablets (0.25-0.5 mg total) by mouth daily as needed for anxiety. 30 tablet 1  . amLODipine (NORVASC) 10 MG tablet Take 1 tablet (10 mg total) by mouth daily. 90 tablet 1  . diclofenac (VOLTAREN) 75 MG EC tablet Take 1 tablet (75 mg total) by mouth 2 (two) times daily. 30 tablet 0  . ibuprofen (ADVIL) 600 MG tablet Take 1 tablet (600 mg total) by mouth every 6 (six) hours as needed. 30 tablet 0  . levothyroxine (SYNTHROID, LEVOTHROID) 125 MCG tablet Take 125 mcg by mouth daily before breakfast.     . methocarbamol (ROBAXIN) 500 MG tablet Take 1 tablet by mouth 4 times daily 60 tablet 0  . sertraline (ZOLOFT) 25 MG tablet Take 1 tablet (25 mg total) by mouth daily. 90 tablet 1  . traMADol (ULTRAM) 50 MG tablet Take 1 tablet (50 mg total) by  mouth every 8 (eight) hours as needed for severe pain. 15 tablet 0   No current facility-administered medications on file prior to visit.    Allergies  Allergen Reactions  . Other     BLOOD REFUSAL   . Sulfa Antibiotics Other (See Comments) and Nausea And Vomiting    Severe bladder, and kidney infection  . Trazodone And Nefazodone Other (See Comments)    Headache every am     Objective: Physical Exam General: The patient is alert and oriented x3 in no acute distress.  Dermatology: Skin is warm, dry and supple bilateral lower extremities. Nails 1-10 are normal. There is no erythema, edema, no eccymosis, no open lesions present. Integument is otherwise unremarkable.  Vascular: Dorsalis Pedis pulse and Posterior Tibial pulse are 2/4 bilateral. Capillary fill time is immediate to all digits.  Neurological: Grossly intact to light touch with an achilles reflex of +2/5 and a negative Tinel's sign bilateral.  Musculoskeletal: Tenderness to palpation at the medial calcaneal tubercale and through the insertion of the plantar fascia on the right with mild lateral foot and ankle pain along the peroneal tendon on right from the insertion of the peroneus brevis tendon to the lateral ankle like previous. There is no pain with compression of calcaneus bilateral. No pain with tuning fork to calcaneus bilateral. No pain with calf compression bilateral. There is decreased Ankle joint range of motion bilateral. All other joints  range of motion within normal limits bilateral. Strength 5/5 in all groups bilateral.   Assessment and Plan: Problem List Items Addressed This Visit    None    Visit Diagnoses    Plantar fasciitis of right foot    -  Primary   Tendinitis       Inflammatory pain of right heel         -Complete examination performed.  -Re-Discussed with patient in detail the condition of plantar fasciitis right with tendonitis  -After oral consent and aseptic prep, injected a mixture  containing 1 ml of 2%  plain lidocaine, 1 ml 0.5% plain marcaine, 0.5 ml of kenalog 10 and 0.5 ml of dexamethasone phosphate into R plantar fascia without complication. Post-injection care discussed with patient.  This is injection #2 for the year. 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  Landis Martins, DPM

## 2019-10-22 ENCOUNTER — Other Ambulatory Visit: Payer: Self-pay | Admitting: Family Medicine

## 2019-10-22 NOTE — Telephone Encounter (Signed)
Requested Prescriptions  Pending Prescriptions Disp Refills  . amLODipine (NORVASC) 10 MG tablet [Pharmacy Med Name: amLODIPine Besylate 10 MG Oral Tablet] 30 tablet 0    Sig: Take 1 tablet by mouth once daily     Cardiovascular:  Calcium Channel Blockers Failed - 10/22/2019 10:43 AM      Failed - Valid encounter within last 6 months    Recent Outpatient Visits          6 months ago Essential hypertension, benign   [facility], Lilia Argue, MD   7 months ago Acute pain of left shoulder   [facility] [place], NP   9 months ago Essential hypertension, benign   [facility], Lilia Argue, MD   9 months ago Adjustment disorder with mixed anxiety and depressed mood   [facility], Lilia Argue, MD   11 months ago Essential hypertension, benign   [facility], Lilia Argue, MD             Passed - Last BP in normal range    BP Readings from Last 1 Encounters:  07/18/19 118/80         . sertraline (ZOLOFT) 25 MG tablet [Pharmacy Med Name: Sertraline HCl 25 MG Oral Tablet] 30 tablet 0    Sig: Take 1 tablet by mouth once daily     Psychiatry:  Antidepressants - SSRI Failed - 10/22/2019 10:43 AM      Failed - Valid encounter within last 6 months    Recent Outpatient Visits          6 months ago Essential hypertension, benign   [facility], Lilia Argue, MD   7 months ago Acute pain of left shoulder   [facility], Delfino Lovett, NP   9 months ago Essential hypertension, benign   [facility], Lilia Argue, MD   9 months ago Adjustment disorder with mixed anxiety and depressed mood   [facility], Lilia Argue, MD   11 months ago Essential hypertension, benign   [facility], Lilia Argue, MD             Courtesy 30 day refill given. Patient needs OV for next refills. Called left VM.

## 2019-10-27 ENCOUNTER — Other Ambulatory Visit: Payer: Self-pay

## 2019-10-27 ENCOUNTER — Ambulatory Visit (INDEPENDENT_AMBULATORY_CARE_PROVIDER_SITE_OTHER): Payer: Self-pay | Admitting: Registered Nurse

## 2019-10-27 VITALS — BP 142/93 | HR 76 | Temp 97.6°F | Ht 66.0 in | Wt 196.2 lb

## 2019-10-27 DIAGNOSIS — I10 Essential (primary) hypertension: Secondary | ICD-10-CM

## 2019-10-27 DIAGNOSIS — M25512 Pain in left shoulder: Secondary | ICD-10-CM

## 2019-10-27 DIAGNOSIS — M25521 Pain in right elbow: Secondary | ICD-10-CM

## 2019-10-27 DIAGNOSIS — F419 Anxiety disorder, unspecified: Secondary | ICD-10-CM

## 2019-10-27 NOTE — Patient Instructions (Signed)
° ° ° °  If you have lab work done today you will be contacted with your lab results within the next 2 weeks.  If you have not heard from us then please contact us. The fastest way to get your results is to register for My Chart. ° ° °IF you received an x-ray today, you will receive an invoice from [facility]. Please contact [facility] at [phone] with questions or concerns regarding your invoice.  ° °IF you received labwork today, you will receive an invoice from [facility]. Please contact [facility] at [phone] with questions or concerns regarding your invoice.  ° °Our billing staff will not be able to assist you with questions regarding bills from these companies. ° °You will be contacted with the lab results as soon as they are available. The fastest way to get your results is to activate your My Chart account. Instructions are located on the last page of this paperwork. If you have not heard from us regarding the results in 2 weeks, please contact this office. °  ° ° ° °

## 2019-10-30 ENCOUNTER — Other Ambulatory Visit: Payer: Self-pay | Admitting: Family Medicine

## 2019-10-30 ENCOUNTER — Other Ambulatory Visit: Payer: Self-pay | Admitting: Registered Nurse

## 2019-10-30 DIAGNOSIS — F4323 Adjustment disorder with mixed anxiety and depressed mood: Secondary | ICD-10-CM

## 2019-10-30 MED ORDER — TRAMADOL HCL 50 MG PO TABS
50.0000 mg | ORAL_TABLET | Freq: Three times a day (TID) | ORAL | 0 refills | Status: DC | PRN
Start: 2019-10-30 — End: 2019-12-14

## 2019-10-30 MED ORDER — ALPRAZOLAM 0.5 MG PO TABS: 0.2500 mg | ORAL_TABLET | Freq: Every day | ORAL | 1 refills | Status: DC | PRN

## 2019-10-30 MED ORDER — AMLODIPINE BESYLATE 10 MG PO TABS
10.0000 mg | ORAL_TABLET | Freq: Every day | ORAL | 0 refills | Status: DC
Start: 2019-10-30 — End: 2019-11-17

## 2019-10-30 MED ORDER — TRAMADOL HCL 50 MG PO TABS
50.0000 mg | ORAL_TABLET | Freq: Three times a day (TID) | ORAL | 0 refills | Status: DC | PRN
Start: 2019-10-30 — End: 2019-10-30

## 2019-10-30 MED ORDER — ALPRAZOLAM 0.5 MG PO TABS: 0.2500 mg | ORAL_TABLET | Freq: Every day | ORAL | 0 refills | Status: DC | PRN

## 2019-10-30 NOTE — Telephone Encounter (Signed)
Requested medication (s) are due for refill today: Diclofenac, yes  Requested medication (s) are on the active medication list:  no  Last refill:  ?  Future visit scheduled: no   Notes to clinic:  not on active med list   Requested medication (s) are due for refill today: Tramadol, yes  Requested medication (s) are on the active medication list: yes  Last refill:  ?  Future visit scheduled: no  Notes to clinic:  historical provider, not delegated  Requested medication (s) are due for refill today: Alprazolam, yes  Requested medication (s) are on the active medication list: no  Last refill:  ?  Future visit scheduled: no  Notes to clinic:  not delegated  Requested medication (s) are due for refill today: Amlodipine, yes  Requested medication (s) are on the active medication list: yes  Last refill:  10/26/19  Future visit scheduled: no  Notes to clinic: l             Requested Prescriptions  Pending Prescriptions Disp Refills   amLODipine (NORVASC) 10 MG tablet 30 tablet 0    Sig: Take 1 tablet (10 mg total) by mouth daily.      Cardiovascular:  Calcium Channel Blockers Failed - 10/30/2019  2:13 PM      Failed - Last BP in normal range    BP Readings from Last 1 Encounters:  10/27/19 (!) 142/93          Passed - Valid encounter within last 6 months    Recent Outpatient Visits           3 days ago Right elbow pain   [facility] [place], NP   7 months ago Essential hypertension, benign   [facility], Lilia Argue, MD   7 months ago Acute pain of left shoulder   [facility], Delfino Lovett, NP   9 months ago Essential hypertension, benign   [facility], Lilia Argue, MD   9 months ago Adjustment disorder with mixed anxiety and depressed mood   [facility], Lilia Argue, MD                traMADol (ULTRAM) 50 MG tablet 15 tablet 0    Sig: Take 1 tablet (50 mg total) by  mouth every 8 (eight) hours as needed for severe pain.      Not Delegated - Analgesics:  Opioid Agonists Failed - 10/30/2019  2:13 PM      Failed - This refill cannot be delegated      Failed - Urine Drug Screen completed in last 360 days.      Passed - Valid encounter within last 6 months    Recent Outpatient Visits           3 days ago Right elbow pain   [facility] [place] [place], NP   7 months ago Essential hypertension, benign   [facility], Lilia Argue, MD   7 months ago Acute pain of left shoulder   [facility], NP   9 months ago Essential hypertension, benign   [facility], Lilia Argue, MD   9 months ago Adjustment disorder with mixed anxiety and depressed mood   [facility], Lilia Argue, MD                ALPRAZolam [name]) 0.5  MG tablet 30 tablet 1    Sig: Take 0.5-1 tablets (0.25-0.5 mg total) by mouth daily as needed for anxiety.      Not Delegated - Psychiatry:  Anxiolytics/Hypnotics Failed - 10/30/2019  2:13 PM      Failed - This refill cannot be delegated      Failed - Urine Drug Screen completed in last 360 days.      Passed - Valid encounter within last 6 months    Recent Outpatient Visits           3 days ago Right elbow pain   [facility] [place], NP   7 months ago Essential hypertension, benign   [facility], Lilia Argue, MD   7 months ago Acute pain of left shoulder   [facility] [place], NP   9 months ago Essential hypertension, benign   [facility], Lilia Argue, MD   9 months ago Adjustment disorder with mixed anxiety and depressed mood   [facility], Lilia Argue, MD

## 2019-10-30 NOTE — Telephone Encounter (Signed)
Medication: traMADol (ULTRAM) 50 MG tablet [[id number]], amLODipine (NORVASC) 10 MG tablet [[id number]] , diclofenac (VOLTAREN) 75 MG EC tablet [[id number]]  DISCONTINUED, ALPRAZolam (XANAX) 0.5 MG tablet [[id number]]   Has the patient contacted their pharmacy? YES (Agent: If no, request that the patient contact the pharmacy for the refill.) (Agent: If yes, when and what did the pharmacy advise?)  Preferred Pharmacy (with phone number or street name): [facility], [address] [place] [address] Phone: [phone] Fax: [phone] Hours: Not open 24 hours    Agent: Please be advised that RX refills may take up to 3 business days. We ask that you follow-up with your pharmacy.

## 2019-10-30 NOTE — Telephone Encounter (Signed)
Patient is requesting a refill of the following medications: Requested Prescriptions   Pending Prescriptions Disp Refills   traMADol (ULTRAM) 50 MG tablet 15 tablet 0    Sig: Take 1 tablet (50 mg total) by mouth every 8 (eight) hours as needed for severe pain.   ALPRAZolam (XANAX) 0.5 MG tablet 30 tablet 1    Sig: Take 0.5-1 tablets (0.25-0.5 mg total) by mouth daily as needed for anxiety.   Signed Prescriptions Disp Refills   amLODipine (NORVASC) 10 MG tablet 30 tablet 0    Sig: Take 1 tablet (10 mg total) by mouth daily.    Authorizing Provider: Maximiano Coss    Ordering User: Patrcia Dolly    Date of patient request: 10/30/2019 Last office visit: 10/27/2019

## 2019-11-17 ENCOUNTER — Other Ambulatory Visit: Payer: Self-pay

## 2019-11-17 ENCOUNTER — Telehealth: Payer: Self-pay | Admitting: Registered Nurse

## 2019-11-17 DIAGNOSIS — I10 Essential (primary) hypertension: Secondary | ICD-10-CM

## 2019-11-17 MED ORDER — AMLODIPINE BESYLATE 10 MG PO TABS
10.0000 mg | ORAL_TABLET | Freq: Every day | ORAL | 0 refills | Status: DC
Start: 2019-11-17 — End: 2019-11-17

## 2019-11-17 MED ORDER — SERTRALINE HCL 25 MG PO TABS: 25.0000 mg | ORAL_TABLET | Freq: Every day | ORAL | 0 refills | Status: DC

## 2019-11-17 MED ORDER — AMLODIPINE BESYLATE 10 MG PO TABS: 10.0000 mg | ORAL_TABLET | Freq: Every day | ORAL | 0 refills | Status: DC

## 2019-11-17 NOTE — Telephone Encounter (Signed)
Pt called and is needing her med that were just refilled to be longer instead of 30 days to 90 due to pt not having insurance. Pt would like a nurse to call her when they send it into the pharmacy. Please advise  amLODipine (NORVASC) 10 MG tablet [[id number]]  sertraline (ZOLOFT) 25 MG tablet [[id number]]  [place], [place].

## 2019-11-17 NOTE — Telephone Encounter (Signed)
Sertraline showed being discontinued in chart , please refill for 90 day supply if appropriate.

## 2019-12-14 ENCOUNTER — Other Ambulatory Visit: Payer: Self-pay | Admitting: Sports Medicine

## 2019-12-14 ENCOUNTER — Telehealth: Payer: Self-pay

## 2019-12-14 MED ORDER — TRAMADOL HCL 50 MG PO TABS
50.0000 mg | ORAL_TABLET | Freq: Three times a day (TID) | ORAL | 0 refills | Status: AC | PRN
Start: 2019-12-14 — End: 2019-12-19

## 2019-12-14 NOTE — Progress Notes (Signed)
Refilled pain medication

## 2019-12-14 NOTE — Telephone Encounter (Signed)
Pt called and would like a refill on her pain medications.

## 2019-12-14 NOTE — Telephone Encounter (Signed)
Refill sent.

## 2019-12-27 ENCOUNTER — Encounter: Payer: Self-pay | Admitting: Registered Nurse

## 2019-12-27 MED ORDER — DICLOFENAC SODIUM 75 MG PO TBEC: 75.0000 mg | DELAYED_RELEASE_TABLET | Freq: Two times a day (BID) | ORAL | 0 refills | Status: DC

## 2019-12-27 MED ORDER — SERTRALINE HCL 50 MG PO TABS: 50.0000 mg | ORAL_TABLET | Freq: Every day | ORAL | 3 refills | Status: DC

## 2019-12-27 NOTE — Progress Notes (Signed)
Established Patient Office Visit  Subjective:  Patient ID: Kara Hall, female    DOB: 1970-06-04  Age: 49 y.o. MRN: [id number]  CC:  Chief Complaint  Patient presents with  . Medication Refill    HPI [name] presents for med refill  Anxiety and depression: taking sertraline daily. Good effect. No aes. Hopes to continue. Has been on this for some time.  R elbow pain: on and off. Seems to be overuse injury. Takes diclofenac 75mg  PO bid prn for pain.   No other concerns  Past Medical History:  Diagnosis Date  . Anemia   . Anxiety    panic attack, also reports problems with depression, relative to her job & upcoming surgery , relative to life problems - with children & spouse    . Back complaints 2013   has had steroid injection   . Depression   . DVT (deep venous thrombosis) ([facility]) 2001   RLE  . Fibroids   . Foot fracture, left   . HTN (hypertension)   . Migraine    h/o migraines - as a teenager  & again now with family issues, states she has to lay down & rest in a dark room  (08/28/2013)  . Muscle strain of hand    wearing home made splint today, L hand  . Peripheral vascular disease ([facility])    DVT- per pt. in 2001- had vein removed.   . Refusal of blood transfusions as patient is Jehovah's Witness   . Thyroid cancer ([facility])   . Thyroid nodule     Past Surgical History:  Procedure Laterality Date  . CYSTOSCOPY N/A 12/18/2018   Procedure: CYSTOSCOPY;  Surgeon: Jonelle Sidle, MD;  [facility];  Service: Gynecology;  Laterality: N/A;  . LAPAROSCOPIC LYSIS OF ADHESIONS N/A 12/18/2018   Procedure: LAPAROSCOPIC LYSIS OF ADHESIONS;  Surgeon: Jonelle Sidle, MD;  Location: [facility];  Service: Gynecology;  Laterality: N/A;  . ROBOTIC ASSISTED LAPAROSCOPIC HYSTERECTOMY AND SALPINGECTOMY Bilateral 12/18/2018   Procedure: XI ROBOTIC ASSISTED LAPAROSCOPIC HYSTERECTOMY AND BILATERAL SALPINGECTOMY;   Surgeon: Jonelle Sidle, MD;  Location: [facility];  Service: Gynecology;  Laterality: Bilateral;  . THROMBECTOMY Right 2001   leg; after 3rd child was born; for deep vein thrombosis  . THYROIDECTOMY Right 08/28/2013   Procedure: RIGHT THYROID LOBECTOMY POSSIBLE TOTAL THYROIDECTOMY;  Surgeon: Jodi Marble, MD;  Location: [facility];  Service: ENT;  Laterality: Right;  . THYROIDECTOMY Left 09/10/2013   Procedure: COMPLETE LEFT THYROIDECTOMY;  Surgeon: Jodi Marble, MD;  Location: [facility];  Service: ENT;  Laterality: Left;  . TUBAL LIGATION  2001    Family History  Problem Relation Age of Onset  . Heart disease Mother   . Hyperlipidemia Mother   . Heart disease Father   . Hyperlipidemia Father   . Diabetes Father   . Heart disease Brother   . Hyperlipidemia Brother   . Hypertension Brother   . Heart disease Brother   . Hyperlipidemia Brother     Social History   Socioeconomic History  . Marital status: Married    Spouse name: Kara Hall  . Number of children: 3  . Years of education: 12+  . Highest education level: Not on file  Occupational History  . Occupation: [employer]  . Occupation: Paediatric nurse: [employer]  Tobacco Use  . Smoking status: Never Smoker  . Smokeless tobacco: Never Used  Vaping Use  .  Vaping Use: Never used  Substance and Sexual Activity  . Alcohol use: Yes    Comment: occ  . Drug use: No  . Sexual activity: Yes    Partners: Male    Birth control/protection: Surgical    Comment: Tubal ligation  Other Topics Concern  . Not on file  Social History Narrative   Divorce from previous husband in 06/2016.   Now remarried 07/2016.   Lives at home with her new husband and cat.   She has three grown children who live with her husband. She has a good relationship with her children.   Her father passed away May 18, 2016.   Social Determinants of Health   Financial Resource Strain: Not on file  Food Insecurity: Not on file   Transportation Needs: Not on file  Physical Activity: Not on file  Stress: Not on file  Social Connections: Not on file  Intimate Partner Violence: Not on file    Outpatient Medications Prior to Visit  Medication Sig Dispense Refill  . levothyroxine (SYNTHROID, LEVOTHROID) 125 MCG tablet Take 125 mcg by mouth daily before breakfast.     . ALPRAZolam (XANAX) 0.5 MG tablet Take 0.5-1 tablets (0.25-0.5 mg total) by mouth daily as needed for anxiety. 30 tablet 1  . amLODipine (NORVASC) 10 MG tablet Take 1 tablet by mouth once daily 30 tablet 0  . sertraline (ZOLOFT) 25 MG tablet Take 1 tablet by mouth once daily 30 tablet 0  . traMADol (ULTRAM) 50 MG tablet Take 1 tablet (50 mg total) by mouth every 8 (eight) hours as needed for severe pain. 15 tablet 0  . diclofenac (VOLTAREN) 75 MG EC tablet Take 1 tablet (75 mg total) by mouth 2 (two) times daily. (Patient not taking: Reported on 10/27/2019) 30 tablet 0  . ibuprofen (ADVIL) 600 MG tablet Take 1 tablet (600 mg total) by mouth every 6 (six) hours as needed. (Patient not taking: Reported on 10/27/2019) 30 tablet 0  . methocarbamol (ROBAXIN) 500 MG tablet Take 1 tablet by mouth 4 times daily (Patient not taking: Reported on 10/27/2019) 60 tablet 0   Facility-Administered Medications Prior to Visit  Medication Dose Route Frequency Provider Last Rate Last Admin  . triamcinolone acetonide (KENALOG) 10 MG/ML injection 10 mg  10 mg Other Once Landis Martins, DPM        Allergies  Allergen Reactions  . Other     BLOOD REFUSAL   . Sulfa Antibiotics Other (See Comments) and Nausea And Vomiting    Severe bladder, and kidney infection  . Trazodone And Nefazodone Other (See Comments)    Headache every am     ROS Review of Systems  Constitutional: Negative.   HENT: Negative.   Eyes: Negative.   Respiratory: Negative.   Cardiovascular: Negative.   Gastrointestinal: Negative.   Genitourinary: Negative.   Musculoskeletal: Positive for  arthralgias. Negative for back pain, gait problem, joint swelling, myalgias, neck pain and neck stiffness.  Skin: Negative.   Neurological: Negative.   Psychiatric/Behavioral: Negative.   All other systems reviewed and are negative.     Objective:    Physical Exam Vitals and nursing note reviewed.  Constitutional:      General: She is not in acute distress.    Appearance: Normal appearance. She is normal weight. She is not ill-appearing, toxic-appearing or diaphoretic.  Cardiovascular:     Rate and Rhythm: Normal rate and regular rhythm.     Heart sounds: Normal heart sounds. No murmur heard. No friction rub.  No gallop.   Pulmonary:     Effort: Pulmonary effort is normal. No respiratory distress.     Breath sounds: Normal breath sounds. No stridor. No wheezing, rhonchi or rales.  Chest:     Chest wall: No tenderness.  Skin:    General: Skin is warm and dry.  Neurological:     General: No focal deficit present.     Mental Status: She is alert and oriented to person, place, and time. Mental status is at baseline.  Psychiatric:        Mood and Affect: Mood normal.        Behavior: Behavior normal.        Thought Content: Thought content normal.        Judgment: Judgment normal.     BP (!) 142/93 (BP Location: Right Arm, Patient Position: Sitting, Cuff Size: Normal)   Pulse 76   Temp 97.6 F (36.4 C) (Temporal)   Ht 5\' 6"  (1.676 m)   Wt 196 lb 3.2 oz (89 kg)   LMP 01/08/2019   SpO2 94%   BMI 31.67 kg/m  Wt Readings from Last 3 Encounters:  10/27/19 196 lb 3.2 oz (89 kg)  07/18/19 190 lb (86.2 kg)  03/30/19 196 lb (88.9 kg)     Health Maintenance Due  Topic Date Due  . Hepatitis C Screening  Never done  . HIV Screening  Never done    There are no preventive care reminders to display for this patient.  No results found for: TSH Lab Results  Component Value Date   WBC 13.9 (H) 12/19/2018   HGB 10.0 (L) 12/19/2018   HCT 32.7 (L) 12/19/2018   MCV 92.1  12/19/2018   PLT 298 12/19/2018   Lab Results  Component Value Date   NA 134 (L) 12/19/2018   K 3.9 12/19/2018   CO2 22 12/19/2018   GLUCOSE 122 (H) 12/19/2018   BUN 16 12/19/2018   CREATININE 0.73 12/19/2018   BILITOT 0.7 11/23/2014   ALKPHOS 44 11/23/2014   AST 13 11/23/2014   ALT 7 11/23/2014   PROT 6.9 11/23/2014   ALBUMIN 4.2 11/23/2014   CALCIUM 7.8 (L) 12/19/2018   ANIONGAP 12 12/19/2018   Lab Results  Component Value Date   CHOL 195 11/23/2014   Lab Results  Component Value Date   HDL 71 11/23/2014   Lab Results  Component Value Date   LDLCALC 113 11/23/2014   Lab Results  Component Value Date   TRIG 57 11/23/2014   Lab Results  Component Value Date   CHOLHDL 2.7 11/23/2014   No results found for: HGBA1C    Assessment & Plan:   Problem List Items Addressed This Visit      Cardiovascular and Mediastinum   Essential hypertension, benign     Other   Anxiety   Relevant Medications   sertraline (ZOLOFT) 50 MG tablet    Other Visit Diagnoses    Right elbow pain    -  Primary   Relevant Medications   diclofenac (VOLTAREN) 75 MG EC tablet   Acute pain of left shoulder          Meds ordered this encounter  Medications  . diclofenac (VOLTAREN) 75 MG EC tablet    Sig: Take 1 tablet (75 mg total) by mouth 2 (two) times daily.    Dispense:  60 tablet    Refill:  0    Order Specific Question:   Supervising Provider    Answer:  GREENE, JEFFREY R [[id number]]  . sertraline (ZOLOFT) 50 MG tablet    Sig: Take 1 tablet (50 mg total) by mouth daily.    Dispense:  90 tablet    Refill:  3    Order Specific Question:   Supervising Provider    Answer:   Carlota Raspberry, JEFFREY R [[id number]]    Follow-up: No follow-ups on file.   PLAN  Medications refilled as ordered above  Return in 1 year for med check, sooner with insurance for cpe and labs  Patient encouraged to call clinic with any questions, comments, or concerns.  Maximiano Coss, NP

## 2019-12-30 ENCOUNTER — Other Ambulatory Visit: Payer: Self-pay | Admitting: Registered Nurse

## 2019-12-30 DIAGNOSIS — M25512 Pain in left shoulder: Secondary | ICD-10-CM

## 2019-12-30 NOTE — Telephone Encounter (Signed)
Requested medication (s) are due for refill today No  Requested medication (s) are on the active medication list   No. This medication was reported not being taken on 10/27/19 at the patient's last office visit.   Future visit scheduled No.  Note to clinic-This medication is not delegated for [facility] to refill.    Requested Prescriptions  Pending Prescriptions Disp Refills   methocarbamol (ROBAXIN) 500 MG tablet [Pharmacy Med Name: Methocarbamol 500 MG Oral Tablet] 60 tablet 0    Sig: Take 1 tablet by mouth 4 times daily      Not Delegated - Analgesics:  Muscle Relaxants Failed - 12/30/2019 10:41 AM      Failed - This refill cannot be delegated      Passed - Valid encounter within last 6 months    Recent Outpatient Visits           2 months ago Right elbow pain   [facility] [place], NP   9 months ago Essential hypertension, benign   [facility], Lilia Argue, MD   9 months ago Acute pain of left shoulder   [facility] [place], NP   11 months ago Essential hypertension, benign   [facility], Lilia Argue, MD   11 months ago Adjustment disorder with mixed anxiety and depressed mood   [facility] at [facility], Lilia Argue, MD

## 2019-12-30 NOTE — Telephone Encounter (Signed)
Patient is requesting a refill of the following medications: Requested Prescriptions   Pending Prescriptions Disp Refills   methocarbamol (ROBAXIN) 500 MG tablet [Pharmacy Med Name: Methocarbamol 500 MG Oral Tablet] 60 tablet 0    Sig: Take 1 tablet by mouth 4 times daily    Date of patient request: 12/30/19 Last office visit: 10/27/19 Date of last refill: 04/07/19 Last refill amount:  Follow up time period per chart:

## 2020-01-13 ENCOUNTER — Telehealth: Payer: Self-pay | Admitting: Registered Nurse

## 2020-01-13 NOTE — Telephone Encounter (Signed)
Called pt and sch an appt for 01/19/20 with Macario Carls Just

## 2020-01-13 NOTE — Telephone Encounter (Signed)
Medication has never been prescribed by anyone at this clinic and this is why they medication hasn't been filled. Pt will need an appt to get this medication prescribed.

## 2020-01-19 ENCOUNTER — Ambulatory Visit (INDEPENDENT_AMBULATORY_CARE_PROVIDER_SITE_OTHER): Payer: Self-pay | Admitting: Family Medicine

## 2020-01-19 ENCOUNTER — Encounter: Payer: Self-pay | Admitting: Family Medicine

## 2020-01-19 ENCOUNTER — Other Ambulatory Visit: Payer: Self-pay

## 2020-01-19 VITALS — BP 137/97 | HR 81 | Temp 98.7°F | Ht 66.0 in | Wt 190.0 lb

## 2020-01-19 DIAGNOSIS — M79671 Pain in right foot: Secondary | ICD-10-CM

## 2020-01-19 DIAGNOSIS — F419 Anxiety disorder, unspecified: Secondary | ICD-10-CM

## 2020-01-19 DIAGNOSIS — M722 Plantar fascial fibromatosis: Secondary | ICD-10-CM

## 2020-01-19 DIAGNOSIS — M5432 Sciatica, left side: Secondary | ICD-10-CM

## 2020-01-19 MED ORDER — TRAMADOL HCL 50 MG PO TABS: 50.0000 mg | ORAL_TABLET | Freq: Four times a day (QID) | ORAL | 0 refills | Status: AC | PRN

## 2020-01-19 MED ORDER — GABAPENTIN 100 MG PO CAPS: 100.0000 mg | ORAL_CAPSULE | Freq: Three times a day (TID) | ORAL | 3 refills | Status: DC

## 2020-01-19 MED ORDER — SERTRALINE HCL 25 MG PO TABS: 50.0000 mg | ORAL_TABLET | Freq: Every day | ORAL | 3 refills | Status: DC

## 2020-01-19 NOTE — Patient Instructions (Addendum)
Sciatica  Sciatica is pain, weakness, tingling, or loss of feeling (numbness) along the sciatic nerve. The sciatic nerve starts in the lower back and goes down the back of each leg. Sciatica usually goes away on its own or with treatment. Sometimes, sciatica may come back (recur). What are the causes? This condition happens when the sciatic nerve is pinched or has pressure put on it. This may be the result of:  A disk in between the bones of the spine bulging out too far (herniated disk).  Changes in the spinal disks that occur with aging.  A condition that affects a muscle in the butt.  Extra bone growth near the sciatic nerve.  A break (fracture) of the area between your hip bones (pelvis).  Pregnancy.  Tumor. This is rare. What increases the risk? You are more likely to develop this condition if you:  Play sports that put pressure or stress on the spine.  Have poor strength and ease of movement (flexibility).  Have had a back injury in the past.  Have had back surgery.  Sit for long periods of time.  Do activities that involve bending or lifting over and over again.  Are very overweight (obese). What are the signs or symptoms? Symptoms can vary from mild to very bad. They may include:  Any of these problems in the lower back, leg, hip, or butt: ? Mild tingling, loss of feeling, or dull aches. ? Burning sensations. ? Sharp pains.  Loss of feeling in the back of the calf or the sole of the foot.  Leg weakness.  Very bad back pain that makes it hard to move. These symptoms may get worse when you cough, sneeze, or laugh. They may also get worse when you sit or stand for long periods of time. How is this treated? This condition often gets better without any treatment. However, treatment may include:  Changing or cutting back on physical activity when you have pain.  Doing exercises and stretching.  Putting ice or heat on the affected area.  Medicines that  help: ? To relieve pain and swelling. ? To relax your muscles.  Shots (injections) of medicines that help to relieve pain, irritation, and swelling.  Surgery. Follow these instructions at home: Medicines  Take over-the-counter and prescription medicines only as told by your doctor.  Ask your doctor if the medicine prescribed to you: ? Requires you to avoid driving or using heavy machinery. ? Can cause trouble pooping (constipation). You may need to take these steps to prevent or treat trouble pooping:  Drink enough fluids to keep your pee (urine) pale yellow.  Take over-the-counter or prescription medicines.  Eat foods that are high in fiber. These include beans, whole grains, and fresh fruits and vegetables.  Limit foods that are high in fat and sugar. These include fried or sweet foods. Managing pain      If told, put ice on the affected area. ? Put ice in a plastic bag. ? Place a towel between your skin and the bag. ? Leave the ice on for 20 minutes, 2-3 times a day.  If told, put heat on the affected area. Use the heat source that your doctor tells you to use, such as a moist heat pack or a heating pad. ? Place a towel between your skin and the heat source. ? Leave the heat on for 20-30 minutes. ? Remove the heat if your skin turns bright red. This is very important if you  are unable to feel pain, heat, or cold. You may have a greater risk of getting burned. Activity   Return to your normal activities as told by your doctor. Ask your doctor what activities are safe for you.  Avoid activities that make your symptoms worse.  Take short rests during the day. ? When you rest for a long time, do some physical activity or stretching between periods of rest. ? Avoid sitting for a long time without moving. Get up and move around at least one time each hour.  Exercise and stretch regularly, as told by your doctor.  Do not lift anything that is heavier than 10 lb (4.5 kg)  while you have symptoms of sciatica. ? Avoid lifting heavy things even when you do not have symptoms. ? Avoid lifting heavy things over and over.  When you lift objects, always lift in a way that is safe for your body. To do this, you should: ? Bend your knees. ? Keep the object close to your body. ? Avoid twisting. General instructions  Stay at a healthy weight.  Wear comfortable shoes that support your feet. Avoid wearing high heels.  Avoid sleeping on a mattress that is too soft or too hard. You might have less pain if you sleep on a mattress that is firm enough to support your back.  Keep all follow-up visits as told by your doctor. This is important. Contact a doctor if:  You have pain that: ? Wakes you up when you are sleeping. ? Gets worse when you lie down. ? Is worse than the pain you have had in the past. ? Lasts longer than 4 weeks.  You lose weight without trying. Get help right away if:  You cannot control when you pee (urinate) or poop (have a bowel movement).  You have weakness in any of these areas and it gets worse: ? Lower back. ? The area between your hip bones. ? Butt. ? Legs.  You have redness or swelling of your back.  You have a burning feeling when you pee. Summary  Sciatica is pain, weakness, tingling, or loss of feeling (numbness) along the sciatic nerve.  This condition happens when the sciatic nerve is pinched or has pressure put on it.  Sciatica can cause pain, tingling, or loss of feeling (numbness) in the lower back, legs, hips, and butt.  Treatment often includes rest, exercise, medicines, and putting ice or heat on the affected area. This information is not intended to replace advice given to you by your health care provider. Make sure you discuss any questions you have with your health care provider. Document Revised: 01/20/2018 Document Reviewed: 01/20/2018 Elsevier Patient Education  [employer].   Sciatica Rehab Ask  your health care provider which exercises are safe for you. Do exercises exactly as told by your health care provider and adjust them as directed. It is normal to feel mild stretching, pulling, tightness, or discomfort as you do these exercises. Stop right away if you feel sudden pain or your pain gets worse. Do not begin these exercises until told by your health care provider. Stretching and range-of-motion exercises These exercises warm up your muscles and joints and improve the movement and flexibility of your hips and back. These exercises also help to relieve pain, numbness, and tingling. Sciatic nerve glide 1. Sit in a chair with your head facing down toward your chest. Place your hands behind your back. Let your shoulders slump forward. 2. Slowly straighten one  of your legs while you tilt your head back as if you are looking toward the ceiling. Only straighten your leg as far as you can without making your symptoms worse. 3. Hold this position for __________ seconds. 4. Slowly return to the starting position. 5. Repeat with your other leg. Repeat __________ times. Complete this exercise __________ times a day. Knee to chest with hip adduction and internal rotation  1. Lie on your back on a firm surface with both legs straight. 2. Bend one of your knees and move it up toward your chest until you feel a gentle stretch in your lower back and buttock. Then, move your knee toward the shoulder that is on the opposite side from your leg. This is hip adduction and internal rotation. ? Hold your leg in this position by holding on to the front of your knee. 3. Hold this position for __________ seconds. 4. Slowly return to the starting position. 5. Repeat with your other leg. Repeat __________ times. Complete this exercise __________ times a day. Prone extension on elbows  1. Lie on your abdomen on a firm surface. A bed may be too soft for this exercise. 2. Prop yourself up on your elbows. 3. Use  your arms to help lift your chest up until you feel a gentle stretch in your abdomen and your lower back. ? This will place some of your body weight on your elbows. If this is uncomfortable, try stacking pillows under your chest. ? Your hips should stay down, against the surface that you are lying on. Keep your hip and back muscles relaxed. 4. Hold this position for __________ seconds. 5. Slowly relax your upper body and return to the starting position. Repeat __________ times. Complete this exercise __________ times a day. Strengthening exercises These exercises build strength and endurance in your back. Endurance is the ability to use your muscles for a long time, even after they get tired. Pelvic tilt This exercise strengthens the muscles that lie deep in the abdomen. 1. Lie on your back on a firm surface. Bend your knees and keep your feet flat on the floor. 2. Tense your abdominal muscles. Tip your pelvis up toward the ceiling and flatten your lower back into the floor. ? To help with this exercise, you may place a small towel under your lower back and try to push your back into the towel. 3. Hold this position for __________ seconds. 4. Let your muscles relax completely before you repeat this exercise. Repeat __________ times. Complete this exercise __________ times a day. Alternating arm and leg raises  1. Get on your hands and knees on a firm surface. If you are on a hard floor, you may want to use padding, such as an exercise mat, to cushion your knees. 2. Line up your arms and legs. Your hands should be directly below your shoulders, and your knees should be directly below your hips. 3. Lift your left leg behind you. At the same time, raise your right arm and straighten it in front of you. ? Do not lift your leg higher than your hip. ? Do not lift your arm higher than your shoulder. ? Keep your abdominal and back muscles tight. ? Keep your hips facing the ground. ? Do not arch your  back. ? Keep your balance carefully, and do not hold your breath. 4. Hold this position for __________ seconds. 5. Slowly return to the starting position. 6. Repeat with your right leg and your left arm.  Repeat __________ times. Complete this exercise __________ times a day. Posture and body mechanics Good posture and healthy body mechanics can help to relieve stress in your body's tissues and joints. Body mechanics refers to the movements and positions of your body while you do your daily activities. Posture is part of body mechanics. Good posture means:  Your spine is in its natural S-curve position (neutral).  Your shoulders are pulled back slightly.  Your head is not tipped forward. Follow these guidelines to improve your posture and body mechanics in your everyday activities. Standing   When standing, keep your spine neutral and your feet about hip width apart. Keep a slight bend in your knees. Your ears, shoulders, and hips should line up.  When you do a task in which you stand in one place for a long time, place one foot up on a stable object that is 2-4 inches (5-10 cm) high, such as a footstool. This helps keep your spine neutral. Sitting   When sitting, keep your spine neutral and keep your feet flat on the floor. Use a footrest, if necessary, and keep your thighs parallel to the floor. Avoid rounding your shoulders, and avoid tilting your head forward.  When working at a desk or a computer, keep your desk at a height where your hands are slightly lower than your elbows. Slide your chair under your desk so you are close enough to maintain good posture.  When working at a computer, place your monitor at a height where you are looking straight ahead and you do not have to tilt your head forward or downward to look at the screen. Resting  When lying down and resting, avoid positions that are most painful for you.  If you have pain with activities such as sitting, bending,  stooping, or squatting, lie in a position in which your body does not bend very much. For example, avoid curling up on your side with your arms and knees near your chest (fetal position).  If you have pain with activities such as standing for a long time or reaching with your arms, lie with your spine in a neutral position and bend your knees slightly. Try the following positions: ? Lying on your side with a pillow between your knees. ? Lying on your back with a pillow under your knees. Lifting   When lifting objects, keep your feet at least shoulder width apart and tighten your abdominal muscles.  Bend your knees and hips and keep your spine neutral. It is important to lift using the strength of your legs, not your back. Do not lock your knees straight out.  Always ask for help to lift heavy or awkward objects. This information is not intended to replace advice given to you by your health care provider. Make sure you discuss any questions you have with your health care provider. Document Revised: 04/25/2018 Document Reviewed: 01/23/2018 Elsevier Patient Education  [employer].   If you have lab work done today you will be contacted with your lab results within the next 2 weeks.  If you have not heard from [place] then please contact us. The fastest way to get your results is to register for My Chart.   IF you received an x-ray today, you will receive an invoice from [facility]. Please contact [facility] at [phone] with questions or concerns regarding your invoice.   IF you received labwork today, you will receive an invoice from [facility]. Please contact [facility] at [phone]  with questions or concerns regarding your invoice.   Our billing staff will not be able to assist you with questions regarding bills from these companies.  You will be contacted with the lab results as soon as they are available. The fastest way to get your results is to activate your My  Chart account. Instructions are located on the last page of this paperwork. If you have not heard from [place] regarding the results in 2 weeks, please contact this office.

## 2020-01-19 NOTE — Progress Notes (Signed)
1/4/202212:02 PM  Kara Hall 20-May-1970, 50 y.o., female [id number]  Chief Complaint  Patient presents with  . Medication Refill    Tramadol for abd and L leg and buttock pain / works at standing in retail     HPI:   Patient is a 50 y.o. female with past medical history significant for HTN, and fibroids who presents today for medication refill  Has been taking sertraline 25mg  (had been increased prior visit) never got this medication Has been on tramadol in the past but medication expired  Works at Prior worked at the [employer] Had issues with [employer] due to Health visitor Had a hysterectomy last year due to fibroids History of thyroid cancer Lost father in 2018 And pain ever since  She tried Diclofenac for pain, continues to take this daily Methocarbamol for pain only at night Married for 4 years Is concerned about her libido and weight gain Has been attempting to increase exercise Has never used gabapentin    Depression screen [facility] 2/9 01/19/2020 10/27/2019 03/30/2019  Decreased Interest 0 0 1  Down, Depressed, Hopeless 0 0 1  PHQ - 2 Score 0 0 2  Altered sleeping - - 1  Tired, decreased energy - - 2  Change in appetite - - 1  Feeling bad or failure about yourself  - - 1  Trouble concentrating - - 0  Moving slowly or fidgety/restless - - 0  Suicidal thoughts - - 0  PHQ-9 Score - - 7  Difficult doing work/chores - - Somewhat difficult  Some recent data might be hidden    Fall Risk  01/19/2020 10/27/2019 03/30/2019 03/09/2019 01/05/2019  Falls in the past year? 0 0 0 0 0  Number falls in past yr: 0 0 0 0 0  Comment - - - - -  Injury with Fall? 0 0 0 0 0  Comment - - - - -  Follow up Falls evaluation completed Falls evaluation completed Falls evaluation completed Falls evaluation completed -     Allergies  Allergen Reactions  . Other     BLOOD REFUSAL   . Sulfa Antibiotics Other (See Comments) and Nausea And Vomiting     Severe bladder, and kidney infection  . Trazodone And Nefazodone Other (See Comments)    Headache every am     Prior to Admission medications   Medication Sig Start Date End Date Taking? Authorizing Provider  ALPRAZolam 01/07/2019) 0.5 MG tablet Take 0.5-1 tablets (0.25-0.5 mg total) by mouth daily as needed for anxiety. 10/30/19   11/01/19, NP  amLODipine (NORVASC) 10 MG tablet Take 1 tablet (10 mg total) by mouth daily. 11/17/19   13/2/21, NP  diclofenac (VOLTAREN) 75 MG EC tablet Take 1 tablet (75 mg total) by mouth 2 (two) times daily. 12/27/19   14/12/21, NP  levothyroxine (SYNTHROID, LEVOTHROID) 125 MCG tablet Take 125 mcg by mouth daily before breakfast.     [provider]  methocarbamol (ROBAXIN) 500 MG tablet Take 1 tablet by mouth 4 times daily 01/01/20   01/03/20, NP  sertraline (ZOLOFT) 25 MG tablet Take 1 tablet (25 mg total) by mouth daily. 11/17/19   13/2/21, NP  sertraline (ZOLOFT) 50 MG tablet Take 1 tablet (50 mg total) by mouth daily. 12/27/19   14/12/21, NP    Past Medical History:  Diagnosis Date  . Anemia   . Anxiety    panic attack, also reports problems with depression,  relative to her job & upcoming surgery , relative to life problems - with children & spouse    . Back complaints 2013   has had steroid injection   . Depression   . DVT (deep venous thrombosis) (HCC) 2001   RLE  . Fibroids   . Foot fracture, left   . HTN (hypertension)   . Migraine    h/o migraines - as a teenager  & again now with family issues, states she has to lay down & rest in a dark room  (08/28/2013)  . Muscle strain of hand    wearing home made splint today, L hand  . Peripheral vascular disease (HCC)    DVT- per pt. in 2001- had vein removed.   . Refusal of blood transfusions as patient is Jehovah's Witness   . Thyroid cancer (HCC)   . Thyroid nodule     Past Surgical History:  Procedure Laterality Date  . CYSTOSCOPY N/A  12/18/2018   Procedure: CYSTOSCOPY;  Surgeon: Rande Bruntaam-Akelman, Rosalea K, MD;  [facility];  Service: Gynecology;  Laterality: N/A;  . LAPAROSCOPIC LYSIS OF ADHESIONS N/A 12/18/2018   Procedure: LAPAROSCOPIC LYSIS OF ADHESIONS;  Surgeon: Rande Bruntaam-Akelman, Rosalea K, MD;  Location: [facility];  Service: Gynecology;  Laterality: N/A;  . ROBOTIC ASSISTED LAPAROSCOPIC HYSTERECTOMY AND SALPINGECTOMY Bilateral 12/18/2018   Procedure: XI ROBOTIC ASSISTED LAPAROSCOPIC HYSTERECTOMY AND BILATERAL SALPINGECTOMY;  Surgeon: Rande Bruntaam-Akelman, Rosalea K, MD;  Location: [facility];  Service: Gynecology;  Laterality: Bilateral;  . THROMBECTOMY Right 2001   leg; after 3rd child was born; for deep vein thrombosis  . THYROIDECTOMY Right 08/28/2013   Procedure: RIGHT THYROID LOBECTOMY POSSIBLE TOTAL THYROIDECTOMY;  Surgeon: Flo ShanksKarol Wolicki, MD;  [facility];  Service: ENT;  Laterality: Right;  . THYROIDECTOMY Left 09/10/2013   Procedure: COMPLETE LEFT THYROIDECTOMY;  Surgeon: Flo ShanksKarol Wolicki, MD;  [facility];  Service: ENT;  Laterality: Left;  . TUBAL LIGATION  2001    Social History   Tobacco Use  . Smoking status: Never Smoker  . Smokeless tobacco: Never Used  Substance Use Topics  . Alcohol use: Yes    Comment: occ    Family History  Problem Relation Age of Onset  . Heart disease Mother   . Hyperlipidemia Mother   . Heart disease Father   . Hyperlipidemia Father   . Diabetes Father   . Heart disease Brother   . Hyperlipidemia Brother   . Hypertension Brother   . Heart disease Brother   . Hyperlipidemia Brother     Review of Systems  Constitutional: Negative for chills, fever and malaise/fatigue.  Eyes: Negative for blurred vision and double vision.  Respiratory: Negative for cough, shortness of breath and wheezing.   Cardiovascular: Negative for chest pain, palpitations and leg swelling.  Gastrointestinal: Negative for abdominal pain, blood in  stool, constipation, diarrhea, heartburn, nausea and vomiting.  Genitourinary: Negative for dysuria, frequency and hematuria.  Musculoskeletal: Positive for back pain (radiating to left leg). Negative for falls and joint pain.  Skin: Negative for rash.  Neurological: Negative for dizziness, weakness and headaches.  Psychiatric/Behavioral: Positive for depression. Negative for suicidal ideas.     OBJECTIVE:  Today's Vitals   01/19/20 1100  BP: (!) 137/97  Pulse: 81  Temp: 98.7 F (37.1 C)  SpO2: 97%  Weight: 190 lb (86.2 kg)  Height: 5\' 6"  (1.676 m)   Body mass index is 30.67 kg/m.   Physical Exam Constitutional:  General: She is not in acute distress.    Appearance: Normal appearance. She is not ill-appearing.  HENT:     Head: Normocephalic.  Cardiovascular:     Rate and Rhythm: Normal rate and regular rhythm.     Pulses: Normal pulses.     Heart sounds: Normal heart sounds. No murmur heard. No friction rub. No gallop.   Pulmonary:     Effort: Pulmonary effort is normal. No respiratory distress.     Breath sounds: Normal breath sounds. No stridor. No wheezing, rhonchi or rales.  Abdominal:     General: Bowel sounds are normal.     Palpations: Abdomen is soft.     Tenderness: There is no abdominal tenderness.  Musculoskeletal:     Lumbar back: No spasms, tenderness or bony tenderness. Positive left straight leg raise test.     Right lower leg: No edema.     Left lower leg: No edema.  Skin:    General: Skin is warm and dry.  Neurological:     Mental Status: She is alert and oriented to person, place, and time.  Psychiatric:        Mood and Affect: Mood normal. Affect is tearful.        Behavior: Behavior normal.     No results found for this or any previous visit (from the past 24 hour(s)).  No results found.   ASSESSMENT and PLAN  Problem List Items Addressed This Visit      Other   Anxiety   Relevant Medications   sertraline (ZOLOFT) 25 MG  tablet    Other Visit Diagnoses    Sciatica of left side    -  Primary   Relevant Medications   sertraline (ZOLOFT) 25 MG tablet   gabapentin (NEURONTIN) 100 MG capsule   Plantar fasciitis, bilateral       Relevant Medications   traMADol (ULTRAM) 50 MG tablet   Inflammatory pain of right heel       Relevant Medications   traMADol (ULTRAM) 50 MG tablet     Discussed increasing sertraline, she would like to stay at the lower dose for now Discussed all concerns and answered all questions Encouraged to follow up if she would like to increase the dose Will try gabapentin tid for pain Tramadol as needed for the pain Discussed the impacts of her job and the chronic nature of pain Encouraged stretching and exercise, declined PT at this time RTC precautions provided  Return in about 6 months (around 07/18/2020), or if symptoms worsen or fail to improve.   Huston Foley Adolfo Granieri, FNP-BC Primary Care at [facility] [place], [place] [address] Ph.  [phone] Fax [phone]

## 2020-02-16 ENCOUNTER — Other Ambulatory Visit: Payer: Self-pay | Admitting: Family Medicine

## 2020-02-16 ENCOUNTER — Telehealth (INDEPENDENT_AMBULATORY_CARE_PROVIDER_SITE_OTHER): Payer: Self-pay | Admitting: Family Medicine

## 2020-02-16 ENCOUNTER — Encounter: Payer: Self-pay | Admitting: Family Medicine

## 2020-02-16 ENCOUNTER — Other Ambulatory Visit: Payer: Self-pay

## 2020-02-16 DIAGNOSIS — R059 Cough, unspecified: Secondary | ICD-10-CM

## 2020-02-16 MED ORDER — HYDROCOD POLST-CPM POLST ER 10-8 MG/5ML PO SUER: 5.0000 mL | Freq: Every evening | ORAL | 0 refills | Status: DC | PRN

## 2020-02-16 MED ORDER — BENZONATATE 100 MG PO CAPS: 100.0000 mg | ORAL_CAPSULE | Freq: Three times a day (TID) | ORAL | 1 refills | Status: DC | PRN

## 2020-02-16 NOTE — Patient Instructions (Addendum)
 Cough, Adult A cough helps to clear your throat and lungs. A cough may be a sign of an illness or another medical condition. An acute cough may only last 2-3 weeks, while a chronic cough may last 8 or more weeks. Many things can cause a cough. They include:  Germs (viruses or bacteria) that attack the airway.  Breathing in things that bother (irritate) your lungs.  Allergies.  Asthma.  Mucus that runs down the back of your throat (postnasal drip).  Smoking.  Acid backing up from the stomach into the tube that moves food from the mouth to the stomach (gastroesophageal reflux).  Some medicines.  Lung problems.  Other medical conditions, such as heart failure or a blood clot in the lung (pulmonary embolism). Follow these instructions at home: Medicines  Take over-the-counter and prescription medicines only as told by your doctor.  Talk with your doctor before you take medicines that stop a cough (cough suppressants). Lifestyle  Do not smoke, and try not to be around smoke. Do not use any products that contain nicotine or tobacco, such as cigarettes, e-cigarettes, and chewing tobacco. If you need help quitting, ask your doctor.  Drink enough fluid to keep your pee (urine) pale yellow.  Avoid caffeine.  Do not drink alcohol if your doctor tells you not to drink.   General instructions  Watch for any changes in your cough. Tell your doctor about them.  Always cover your mouth when you cough.  Stay away from things that make you cough, such as perfume, candles, campfire smoke, or cleaning products.  If the air is dry, use a cool mist vaporizer or humidifier in your home.  If your cough is worse at night, try using extra pillows to raise your head up higher while you sleep.  Rest as needed.  Keep all follow-up visits as told by your doctor. This is important.   Contact a doctor if:  You have new symptoms.  You cough up pus.  Your cough does not get better after  2-3 weeks, or your cough gets worse.  Cough medicine does not help your cough and you are not sleeping well.  You have pain that gets worse or pain that is not helped with medicine.  You have a fever.  You are losing weight and you do not know why.  You have night sweats. Get help right away if:  You cough up blood.  You have trouble breathing.  Your heartbeat is very fast. These symptoms may be an emergency. Do not wait to see if the symptoms will go away. Get medical help right away. Call your local emergency services (911 in the [place] Do not drive yourself to the hospital. Summary  A cough helps to clear your throat and lungs. Many things can cause a cough.  Take over-the-counter and prescription medicines only as told by your doctor.  Always cover your mouth when you cough.  Contact a doctor if you have new symptoms or you have a cough that does not get better or gets worse. This information is not intended to replace advice given to you by your health care provider. Make sure you discuss any questions you have with your health care provider. Document Revised: 02/20/2019 Document Reviewed: 01/20/2018 Elsevier Patient Education  2021 Elsevier Inc.    If you have lab work done today you will be contacted with your lab results within the next 2 weeks.  If you have not heard from us then please   contact us. The fastest way to get your results is to register for My Chart.   IF you received an x-ray today, you will receive an invoice from [facility]. Please contact [facility] at [phone] with questions or concerns regarding your invoice.   IF you received labwork today, you will receive an invoice from [facility]. Please contact [facility] at [phone] with questions or concerns regarding your invoice.   Our billing staff will not be able to assist you with questions regarding bills from these companies.  You will be contacted with the lab results as  soon as they are available. The fastest way to get your results is to activate your My Chart account. Instructions are located on the last page of this paperwork. If you have not heard from us regarding the results in 2 weeks, please contact this office.      

## 2020-02-16 NOTE — Progress Notes (Signed)
Virtual Visit Note  I connected with patient on 02/16/20 at [facility] by telephone due to unable to work Epic video visit and verified that I am speaking with the correct person using two identifiers. Kara Hall is currently located at home and no family members are currently with them during visit. The provider, Laurita Quint Trenden Hazelrigg, FNP is located in their office at time of visit.  I discussed the limitations, risks, security and privacy concerns of performing an evaluation and management service by telephone and the availability of in person appointments. I also discussed with the patient that there may be a patient responsible charge related to this service. The patient expressed understanding and agreed to proceed.   I provided 20 minutes of non-face-to-face time during this encounter.  Chief Complaint  Patient presents with  . Cough    X 2 weeks was covid positive 01/27/20- request tessalon pearls  . Headache    And not resting well    HPI ? Was positive for covid on 1/18 with a home test 02/08/20 in clinic was negative Was concerned since she lived with her mother Taking OTC cough syrup with minimal relief All other symptoms have resolved     Allergies  Allergen Reactions  . Other     BLOOD REFUSAL   . Sulfa Antibiotics Other (See Comments) and Nausea And Vomiting    Severe bladder, and kidney infection  . Trazodone And Nefazodone Other (See Comments)    Headache every am     Prior to Admission medications   Medication Sig Start Date End Date Taking? Authorizing Provider  ALPRAZolam [name]) 0.5 MG tablet Take 0.5-1 tablets (0.25-0.5 mg total) by mouth daily as needed for anxiety. 10/30/19  Yes Maximiano Coss, NP  amLODipine (NORVASC) 10 MG tablet Take 1 tablet (10 mg total) by mouth daily. 11/17/19  Yes Maximiano Coss, NP  diclofenac (VOLTAREN) 75 MG EC tablet Take 1 tablet (75 mg total) by mouth 2 (two) times daily. 12/27/19  Yes Maximiano Coss, NP  gabapentin  (NEURONTIN) 100 MG capsule Take 1 capsule (100 mg total) by mouth 3 (three) times daily. 01/19/20  Yes Ruby Dilone, Laurita Quint, FNP  levothyroxine (SYNTHROID, LEVOTHROID) 125 MCG tablet Take 125 mcg by mouth daily before breakfast.    Yes [provider]  methocarbamol (ROBAXIN) 500 MG tablet Take 1 tablet by mouth 4 times daily 01/01/20  Yes Maximiano Coss, NP  sertraline (ZOLOFT) 25 MG tablet Take 2 tablets (50 mg total) by mouth daily. 01/19/20  Yes Dierdra Salameh, Laurita Quint, FNP    Past Medical History:  Diagnosis Date  . Anemia   . Anxiety    panic attack, also reports problems with depression, relative to her job & upcoming surgery , relative to life problems - with children & spouse    . Back complaints 2013   has had steroid injection   . Depression   . DVT (deep venous thrombosis) ([place]) 2001   RLE  . Fibroids   . Foot fracture, left   . HTN (hypertension)   . Migraine    h/o migraines - as a teenager  & again now with family issues, states she has to lay down & rest in a dark room  (08/28/2013)  . Muscle strain of hand    wearing home made splint today, L hand  . Peripheral vascular disease (HCC)    DVT- per pt. in 2001- had vein removed.   . Refusal of blood transfusions as patient is  Jehovah's Witness   . Thyroid cancer ([place])   . Thyroid nodule     Past Surgical History:  Procedure Laterality Date  . CYSTOSCOPY N/A 12/18/2018   Procedure: CYSTOSCOPY;  Surgeon: Jonelle Sidle, MD;  Location: [facility];  Service: Gynecology;  Laterality: N/A;  . LAPAROSCOPIC LYSIS OF ADHESIONS N/A 12/18/2018   Procedure: LAPAROSCOPIC LYSIS OF ADHESIONS;  Surgeon: Jonelle Sidle, MD;  Location: [facility];  Service: Gynecology;  Laterality: N/A;  . ROBOTIC ASSISTED LAPAROSCOPIC HYSTERECTOMY AND SALPINGECTOMY Bilateral 12/18/2018   Procedure: XI ROBOTIC ASSISTED LAPAROSCOPIC HYSTERECTOMY AND BILATERAL SALPINGECTOMY;  Surgeon: Jonelle Sidle, MD;   Location: [facility];  Service: Gynecology;  Laterality: Bilateral;  . THROMBECTOMY Right 2001   leg; after 3rd child was born; for deep vein thrombosis  . THYROIDECTOMY Right 08/28/2013   Procedure: RIGHT THYROID LOBECTOMY POSSIBLE TOTAL THYROIDECTOMY;  Surgeon: Jodi Marble, MD;  Location: [facility];  Service: ENT;  Laterality: Right;  . THYROIDECTOMY Left 09/10/2013   Procedure: COMPLETE LEFT THYROIDECTOMY;  Surgeon: Jodi Marble, MD;  Location: [facility];  Service: ENT;  Laterality: Left;  . TUBAL LIGATION  2001    Social History   Tobacco Use  . Smoking status: Never Smoker  . Smokeless tobacco: Never Used  Substance Use Topics  . Alcohol use: Yes    Comment: occ    Family History  Problem Relation Age of Onset  . Heart disease Mother   . Hyperlipidemia Mother   . Heart disease Father   . Hyperlipidemia Father   . Diabetes Father   . Heart disease Brother   . Hyperlipidemia Brother   . Hypertension Brother   . Heart disease Brother   . Hyperlipidemia Brother     Review of Systems  Constitutional: Positive for malaise/fatigue. Negative for chills and fever.  Eyes: Negative for blurred vision and double vision.  Respiratory: Positive for cough and sputum production. Negative for shortness of breath and wheezing.   Cardiovascular: Negative for chest pain, palpitations and leg swelling.  Gastrointestinal: Positive for diarrhea (Constant issue, no change). Negative for abdominal pain, blood in stool, constipation, heartburn, nausea and vomiting.  Genitourinary: Negative for dysuria, frequency and hematuria.  Musculoskeletal: Negative for back pain and joint pain.  Skin: Negative for rash.  Neurological: Negative for dizziness, weakness and headaches.  Psychiatric/Behavioral: The patient has insomnia.     Objective  Constitutional:      General: Not in acute distress.    Appearance: Normal appearance. Not ill-appearing.   Pulmonary:     Effort: Pulmonary  effort is normal. No respiratory distress.  Neurological:     Mental Status: Alert and oriented to person, place, and time.  Psychiatric:        Mood and Affect: Mood normal.        Behavior: Behavior normal.    ASSESSMENT and PLAN  Problem List Items Addressed This Visit   None   Visit Diagnoses    Cough    -  Primary   Relevant Medications   chlorpheniramine-HYDROcodone (TUSSIONEX PENNKINETIC ER) 10-8 MG/5ML SUER   benzonatate (TESSALON) 100 MG capsule      Plan . Discussed conservative treatment of symptoms with humidifier, rest, fluids, OTC treatments . R/se/b of medications discussed . RTC/ED precautions provided  Return if symptoms worsen or fail to improve.    The above assessment and management plan was discussed with the patient. The patient verbalized understanding of and has agreed to  the management plan. Patient is aware to call the clinic if symptoms persist or worsen. Patient is aware when to return to the clinic for a follow-up visit. Patient educated on when it is appropriate to go to the emergency department.     Huston Foley Deaundre Allston, FNP-BC Primary Care at [facility] [place], [place] [address] Ph.  [phone] Fax [phone]

## 2020-02-16 NOTE — Telephone Encounter (Signed)
Pharmacy out of cough medicine prescribed (on back order). Patient requesting prescription be called in to [facility] on [address]. Or [facility] on [address] Or  [facility].  Patient requesting provider confirm medication is at pharmacy before sending over script.   Please advise at [phone].

## 2020-02-16 NOTE — Telephone Encounter (Signed)
Patient called in asking if medication can be called in to the [facility] on [address] and state that if they don't have it in stock can something else be called in for her please. Can be reached at Ph# [phone]

## 2020-04-12 ENCOUNTER — Other Ambulatory Visit: Payer: Self-pay

## 2020-04-12 ENCOUNTER — Other Ambulatory Visit: Payer: Self-pay | Admitting: Family Medicine

## 2020-04-12 ENCOUNTER — Encounter: Payer: Self-pay | Admitting: Internal Medicine

## 2020-04-12 ENCOUNTER — Ambulatory Visit: Payer: 59 | Attending: Internal Medicine | Admitting: Internal Medicine

## 2020-04-12 DIAGNOSIS — K219 Gastro-esophageal reflux disease without esophagitis: Secondary | ICD-10-CM

## 2020-04-12 DIAGNOSIS — R197 Diarrhea, unspecified: Secondary | ICD-10-CM

## 2020-04-12 DIAGNOSIS — R140 Abdominal distension (gaseous): Secondary | ICD-10-CM | POA: Diagnosis not present

## 2020-04-12 DIAGNOSIS — Z1211 Encounter for screening for malignant neoplasm of colon: Secondary | ICD-10-CM

## 2020-04-12 MED ORDER — OMEPRAZOLE 20 MG PO CPDR: 20.0000 mg | DELAYED_RELEASE_CAPSULE | Freq: Every day | ORAL | 3 refills | Status: DC

## 2020-04-12 NOTE — Telephone Encounter (Signed)
   Future visit scheduled:  yes  Notes to clinic: medication was last filled by a historical provider  Review for refill Patient had appointment today   Requested Prescriptions  Pending Prescriptions Disp Refills   traMADol (ULTRAM) 50 MG tablet 30 tablet     Sig: 1 tablet as needed      There is no refill protocol information for this order

## 2020-04-12 NOTE — Telephone Encounter (Signed)
Medication Refill - Medication:   traMADol (ULTRAM) 50 MG tablet   Has the patient contacted their pharmacy? Pt had virtual visit today and has future appt on 5/5, wanted to see if this could be refilled.  (Agent: If no, request that the patient contact the pharmacy for the refill.) (Agent: If yes, when and what did the pharmacy advise?)  Preferred Pharmacy (with phone number or street name):   [facility] [place], [address]  [place] [address]  Phone: [phone] Fax: [phone]     Agent: Please be advised that RX refills may take up to 3 business days. We ask that you follow-up with your pharmacy.

## 2020-04-12 NOTE — Progress Notes (Signed)
Pt states she has major digestive issues and she is wanting a referral to gi

## 2020-04-12 NOTE — Progress Notes (Addendum)
Virtual Visit via Telephone Note  I connected with Kara Hall on 04/12/20 at  2:37 p.m by telephone and verified that I am speaking with the correct person using two identifiers.  Location: Patient: home Provider: office  Participants: Myself Patient CMA: Ms. Sallyanne Havers   I discussed the limitations, risks, security and privacy concerns of performing an evaluation and management service by telephone and the availability of in person appointments. I also discussed with the patient that there may be a patient responsible charge related to this service. The patient expressed understanding and agreed to proceed.   History of Present Illness: Patient with history of HTN, thyroid cancer (dx age 50, s/p thyroidectomy), anxiety Pt wants to est care.  Previous PCP was at [facility] which recently closed down.  Wants to est care with [place].    Confirms dx of HTN, thyroid CA, sciatica LA side (on Voltaren and Tramadol), anxiety/depression on Zoloft.  Her main concern and request today is referral to see a gastroenterologist for gastrointestinal issues. -Complains of intermittent diarrhea at least 4 times a week associated with painful bloating.  Likely is 4 times a week in the mornings after eating breakfast she can have 2-4 BMs some of which may be loose.  Can occur for 2 weeks and then no diarrhea or bloating for a week before symptoms return.  . -Foods that can cause bloating for her includes bread, beans.  Endorses symptoms of acid reflux.  The symptoms include heated feeling in the chest, burping and belching.  Not on any PPIs or H2 blockers -No blood in stools.  No weight changes.  Thyroid level checked last week by endocrinologist and reportedly normal. -Maternal grandfather had colon cancer at the age of 29. -Has history of anxiety and very anxious about her symptoms.  Requesting referral to GI. -Requesting short supply of tramadol because her abdominal bloating painful.     Outpatient  Encounter Medications as of 04/12/2020  Medication Sig  . ALPRAZolam (XANAX) 0.5 MG tablet Take 0.5-1 tablets (0.25-0.5 mg total) by mouth daily as needed for anxiety.  Marland Kitchen amLODipine (NORVASC) 10 MG tablet Take 1 tablet (10 mg total) by mouth daily. (Patient taking differently: Take 50 mg by mouth daily.)  . diclofenac (VOLTAREN) 75 MG EC tablet Take 1 tablet (75 mg total) by mouth 2 (two) times daily.  Marland Kitchen levothyroxine (SYNTHROID, LEVOTHROID) 125 MCG tablet Take 125 mcg by mouth daily before breakfast.   . sertraline (ZOLOFT) 25 MG tablet Take 2 tablets (50 mg total) by mouth daily.  . traMADol (ULTRAM) 50 MG tablet 1 tablet as needed  . methocarbamol (ROBAXIN) 500 MG tablet Take 1 tablet by mouth 4 times daily (Patient not taking: Reported on 04/12/2020)  . [DISCONTINUED] benzonatate (TESSALON) 100 MG capsule Take 1-2 capsules (100-200 mg total) by mouth 3 (three) times daily as needed for cough. (Patient not taking: Reported on 04/12/2020)  . [DISCONTINUED] chlorpheniramine-HYDROcodone (TUSSIONEX PENNKINETIC ER) 10-8 MG/5ML SUER Take 5 mLs by mouth at bedtime as needed for cough. (Patient not taking: Reported on 04/12/2020)  . [DISCONTINUED] gabapentin (NEURONTIN) 100 MG capsule Take 1 capsule (100 mg total) by mouth 3 (three) times daily. (Patient not taking: Reported on 04/12/2020)   Facility-Administered Encounter Medications as of 04/12/2020  Medication  . triamcinolone acetonide (KENALOG) 10 MG/ML injection 10 mg    .   Observations/Objective: Patient very talkative and constantly goes off in tangents and has to be redirected.  Assessment and Plan: .1. Abdominal bloating 2. Diarrhea, unspecified  type Symptoms suspicious for irritable bowel syndrome with diarrhea predominance. Advised to avoid foods or eat smaller portions of foods that increase bloating for her.  I listed some foods that can cause bloating including beans and green leafy vegetables. -We do not use tramadol to treat  bloating.  We will hold off on refilling tramadol until in person visit where we can evaluate her need for tramadol based on musculoskeletal issues and to a controlled substance prescribing agreement if assessed to need tramadol long-term. -Reasonable to refer to GI  3. Gastroesophageal reflux disease without esophagitis GERD precautions discussed including avoiding certain foods like spicy foods, juices, tomato-based foods.  Advised to eat last meal at least 2 to 3 hours before lying down at night and to sleep with the head slightly elevated. Will give trial of omeprazole given she is currently on an NSAID for sciatica.  4. Screening for colon cancer Referred to GI.  Follow Up Instructions: 4-7 wks in person   I discussed the assessment and treatment plan with the patient. The patient was provided an opportunity to ask questions and all were answered. The patient agreed with the plan and demonstrated an understanding of the instructions.   The patient was advised to call back or seek an in-person evaluation if the symptoms worsen or if the condition fails to improve as anticipated.  I  Spent 25 minutes on this telephone encounter  Karle Plumber, MD

## 2020-05-02 ENCOUNTER — Other Ambulatory Visit: Payer: Self-pay | Admitting: Internal Medicine

## 2020-05-02 DIAGNOSIS — I10 Essential (primary) hypertension: Secondary | ICD-10-CM

## 2020-05-02 NOTE — Telephone Encounter (Signed)
Copied from [place] [id number]. Topic: Quick Communication - Rx Refill/Question >> May 02, 2020 10:02 AM Leward Quan A wrote: Medication: amLODipine (NORVASC) 10 MG tablet   Has the patient contacted their pharmacy? Yes.   (Agent: If no, request that the patient contact the pharmacy for the refill.) (Agent: If yes, when and what did the pharmacy advise?)  Preferred Pharmacy (with phone number or street name): [facility], [place].  Phone:  [phone] Fax:  [phone]     Agent: Please be advised that RX refills may take up to 3 business days. We ask that you follow-up with your pharmacy.

## 2020-05-02 NOTE — Telephone Encounter (Signed)
  Notes to clinic:  Verify dose of medication. Patient seems to be taking different than prescribed    Requested Prescriptions  Pending Prescriptions Disp Refills   amLODipine (NORVASC) 10 MG tablet 90 tablet 0    Sig: Take 1 tablet (10 mg total) by mouth daily.      Cardiovascular:  Calcium Channel Blockers Failed - 05/02/2020 10:08 AM      Failed - Last BP in normal range    BP Readings from Last 1 Encounters:  01/19/20 (!) 137/97          Passed - Valid encounter within last 6 months    Recent Outpatient Visits           2 weeks ago Abdominal bloating   [place], MD   2 months ago Cough   [facility] [place] [facility], Laurita Quint, FNP   3 months ago Sciatica of left side   [facility] [place] [facility], Laurita Quint, FNP   6 months ago Right elbow pain   [facility] [place], NP   1 year ago Essential hypertension, benign   [facility] at [facility], Lilia Argue, MD       Future Appointments             In 2 weeks Thereasa Solo, [place] B[facility] [place]   In 1 month Just, Laurita Quint, FNP [facility] [place], [facility]

## 2020-05-03 MED ORDER — AMLODIPINE BESYLATE 10 MG PO TABS: 10.0000 mg | ORAL_TABLET | Freq: Every day | ORAL | 1 refills | Status: DC

## 2020-05-04 ENCOUNTER — Ambulatory Visit (INDEPENDENT_AMBULATORY_CARE_PROVIDER_SITE_OTHER): Payer: 59

## 2020-05-04 ENCOUNTER — Telehealth: Payer: Self-pay | Admitting: Family

## 2020-05-04 ENCOUNTER — Encounter: Payer: Self-pay | Admitting: Family

## 2020-05-04 ENCOUNTER — Ambulatory Visit (INDEPENDENT_AMBULATORY_CARE_PROVIDER_SITE_OTHER): Payer: 59 | Admitting: Family

## 2020-05-04 ENCOUNTER — Other Ambulatory Visit: Payer: Self-pay | Admitting: Family Medicine

## 2020-05-04 DIAGNOSIS — M79671 Pain in right foot: Secondary | ICD-10-CM

## 2020-05-04 DIAGNOSIS — W19XXXA Unspecified fall, initial encounter: Secondary | ICD-10-CM | POA: Diagnosis not present

## 2020-05-04 DIAGNOSIS — G8929 Other chronic pain: Secondary | ICD-10-CM

## 2020-05-04 DIAGNOSIS — M5442 Lumbago with sciatica, left side: Secondary | ICD-10-CM

## 2020-05-04 DIAGNOSIS — M533 Sacrococcygeal disorders, not elsewhere classified: Secondary | ICD-10-CM

## 2020-05-04 DIAGNOSIS — M722 Plantar fascial fibromatosis: Secondary | ICD-10-CM

## 2020-05-04 MED ORDER — HYDROCODONE-ACETAMINOPHEN 5-325 MG PO TABS: 1.0000 | ORAL_TABLET | Freq: Four times a day (QID) | ORAL | 0 refills | Status: DC | PRN

## 2020-05-04 NOTE — Progress Notes (Signed)
Office Visit Note   Patient: Kara Hall           Date of Birth: [birth year]           MRN: [id number] Visit Date: 05/04/2020              Requested by: Ladell Pier, MD [place],  [place] [id number] PCP: Ladell Pier, MD  No chief complaint on file.     HPI: 50 year old woman who presents today complaining of left-sided low back buttock pain.  (Ongoing off and on for the last 3 years.  She reports about 3 years.  He had a fall while at work she at that time fractured metatarsal.  And landed on his buttock on the cement floor.  Looking back she is worried that she fractured her 12 at the time of the fall.  Worried injury at that time may be causing her pain today.  For the last few days he has been having gradually worsening left-sided low back and buttock pain that feels like shooting and burning radiates down the posterior thigh and into her calf.  is made worse by standing and ambulating.  Is most comfortable when sitting down.  Has been having difficulty sleeping due to pain.  Denies any weakness.  No loss of control of bowel or bladder.  No recent injury or fall.  Has tried stretches and home exercise without relief    Assessment & Plan: Visit Diagnoses:  1. Chronic left-sided low back pain with left-sided sciatica   2. Fall, initial encounter   3. Tail bone pain     Plan: Left-sided lumbar radiculopathy. offered prednisone.  She declines at this time.  She would like referral for ESI.  Reassured there is no history of melanoma.   Follow-Up Instructions: No follow-ups on file.   Back Exam   Tenderness  The patient is experiencing no tenderness.   Muscle Strength  The patient has normal back strength.  Tests  Straight leg raise right: negative Straight leg raise left: positive      Patient is alert, oriented, no adenopathy, well-dressed, normal affect, normal respiratory effort.   Imaging: No results found. No images are  attached to the encounter.  Labs: Lab Results  Component Value Date   GRAMSTAIN Few 05/04/2014   GRAMSTAIN WBC present-predominately PMN 05/04/2014   GRAMSTAIN No Squamous Epithelial Cells Seen 05/04/2014   GRAMSTAIN Rare Gram Positive Cocci In Pairs 05/04/2014   LABORGA Normal Upper Respiratory Flora 08/08/2015   LABORGA No Beta Hemolytic Streptococci Isolated 08/08/2015     Lab Results  Component Value Date   ALBUMIN 4.2 11/23/2014   ALBUMIN 3.6 09/09/2013   ALBUMIN 3.7 08/21/2013    No results found for: MG No results found for: VD25OH  No results found for: PREALBUMIN CBC EXTENDED Latest Ref Rng & Units 12/19/2018 12/19/2018 12/18/2018  WBC 4.0 - 10.5 K/uL 13.9(H) 13.8(H) 7.2  RBC 3.87 - 5.11 MIL/uL 3.55(L) 3.58(L) 4.30  HGB 12.0 - 15.0 g/dL 10.0(L) 9.9(L) 11.9(L)  HCT 36.0 - 46.0 % 32.7(L) 33.0(L) 38.4  PLT 150 - 400 K/uL 298 288 342  NEUTROABS 1.7 - 7.7 K/uL - - -  LYMPHSABS 0.7 - 4.0 K/uL - - -     There is no height or weight on file to calculate BMI.  Orders:  Orders Placed This Encounter  Procedures  . XR Lumbar Spine 2-3 Views  . XR Sacrum/Coccyx   No orders of the  defined types were placed in this encounter.    Procedures: No procedures performed  Clinical Data: No additional findings.  ROS:  All other systems negative, except as noted in the HPI. Review of Systems  Constitutional: Negative for chills and fever.  Musculoskeletal: Positive for back pain.  Neurological: Negative for weakness and numbness.    Objective: Vital Signs: LMP 01/08/2019   Specialty Comments:  No specialty comments available.  PMFS History: Patient Active Problem List   Diagnosis Date Noted  . Status post hysterectomy 12/18/2018  . Prolonged Q-T interval on ECG 11/24/2018  . Essential hypertension, benign 11/24/2018  . Irregular periods 10/08/2018  . Leiomyoma 10/08/2018  . Dyspareunia in female 03/24/2018  . Increased body mass index 03/24/2018  . Carpal  tunnel syndrome on both sides 12/10/2017  . Achilles tendon contracture, left 10/04/2016  . Displaced fracture of fifth metatarsal bone of left foot with delayed healing 05/07/2016  . Bilateral wrist pain 11/28/2015  . History of thyroid cancer 08/09/2013  . Anxiety 01/12/2013   Past Medical History:  Diagnosis Date  . Anemia   . Anxiety    panic attack, also reports problems with depression, relative to her job & upcoming surgery , relative to life problems - with children & spouse    . Back complaints 2013   has had steroid injection   . Depression   . DVT (deep venous thrombosis) ([place]) 2001   RLE  . Fibroids   . Foot fracture, left   . HTN (hypertension)   . Migraine    h/o migraines - as a teenager  & again now with family issues, states she has to lay down & rest in a dark room  (08/28/2013)  . Muscle strain of hand    wearing home made splint today, L hand  . Peripheral vascular disease (HCC)    DVT- per pt. in 2001- had vein removed.   . Refusal of blood transfusions as patient is Jehovah's Witness   . Thyroid cancer ([place])   . Thyroid nodule     Family History  Problem Relation Age of Onset  . Heart disease Mother   . Hyperlipidemia Mother   . Heart disease Father   . Hyperlipidemia Father   . Diabetes Father   . Heart disease Brother   . Hyperlipidemia Brother   . Hypertension Brother   . Heart disease Brother   . Hyperlipidemia Brother     Past Surgical History:  Procedure Laterality Date  . CYSTOSCOPY N/A 12/18/2018   Procedure: CYSTOSCOPY;  Surgeon: Jonelle Sidle, MD;  Location: [facility];  Service: Gynecology;  Laterality: N/A;  . LAPAROSCOPIC LYSIS OF ADHESIONS N/A 12/18/2018   Procedure: LAPAROSCOPIC LYSIS OF ADHESIONS;  Surgeon: Jonelle Sidle, MD;  Location: [place];  Service: Gynecology;  Laterality: N/A;  . ROBOTIC ASSISTED LAPAROSCOPIC HYSTERECTOMY AND SALPINGECTOMY Bilateral 12/18/2018    Procedure: XI ROBOTIC ASSISTED LAPAROSCOPIC HYSTERECTOMY AND BILATERAL SALPINGECTOMY;  Surgeon: Jonelle Sidle, MD;  Location: [place];  Service: Gynecology;  Laterality: Bilateral;  . THROMBECTOMY Right 2001   leg; after 3rd child was born; for deep vein thrombosis  . THYROIDECTOMY Right 08/28/2013   Procedure: RIGHT THYROID LOBECTOMY POSSIBLE TOTAL THYROIDECTOMY;  Surgeon: Jodi Marble, MD;  Location: [place];  Service: ENT;  Laterality: Right;  . THYROIDECTOMY Left 09/10/2013   Procedure: COMPLETE LEFT THYROIDECTOMY;  Surgeon: Jodi Marble, MD;  Location: [place];  Service: ENT;  Laterality: Left;  .  TUBAL LIGATION  2001   Social History   Occupational History  . Occupation: [employer]  . Occupation: Paediatric nurse: [employer]  Tobacco Use  . Smoking status: Never Smoker  . Smokeless tobacco: Never Used  Vaping Use  . Vaping Use: Never used  Substance and Sexual Activity  . Alcohol use: Yes    Comment: occ  . Drug use: No  . Sexual activity: Yes    Partners: Male    Birth control/protection: Surgical    Comment: Tubal ligation

## 2020-05-04 NOTE — Telephone Encounter (Signed)
Patient called requesting PA Junie Panning to call her pharmacy so they can release other set of hydrocodone. Please call patient about this matter at [phone].

## 2020-05-05 NOTE — Telephone Encounter (Signed)
Called pharmacy. Pt picked up the rx yesterday

## 2020-05-06 ENCOUNTER — Telehealth: Payer: Self-pay | Admitting: Physical Medicine and Rehabilitation

## 2020-05-06 NOTE — Telephone Encounter (Signed)
Patient called asked for a call back to schedule an appointment with Dr Ernestina Patches for an injection in her back. The number to contact patient is [phone]

## 2020-05-06 NOTE — Telephone Encounter (Signed)
Called pt and LVM #1 

## 2020-05-09 NOTE — Telephone Encounter (Signed)
Called pt and inform her we will call once insurance approve.

## 2020-05-11 NOTE — Telephone Encounter (Signed)
Called pt and sch 5/11

## 2020-05-18 ENCOUNTER — Telehealth: Payer: Self-pay | Admitting: Family

## 2020-05-18 MED ORDER — HYDROCODONE-ACETAMINOPHEN 5-325 MG PO TABS: 1.0000 | ORAL_TABLET | Freq: Three times a day (TID) | ORAL | 0 refills | Status: DC | PRN

## 2020-05-18 NOTE — Telephone Encounter (Signed)
Patient called advised the pharmacy gave her 20 tabs instead of the 30 that was prescribed for her. Patient asked if the pharmacy can be contacted so that she can get the rest of the medication? The number to contact patient is [phone]

## 2020-05-18 NOTE — Telephone Encounter (Signed)
Rx that you gave pt was initial rx and filled only as a 5 day supply so that is why she only received 20 tabs vs the 30 you wrote for. Will you write another rx for the pt? It needs to include the diagnosis ( not the code but the actually treating diagnosis) this is a new requirement for all [facility] rx. Thanks!

## 2020-05-19 ENCOUNTER — Other Ambulatory Visit: Payer: Self-pay

## 2020-05-19 ENCOUNTER — Encounter: Payer: Self-pay | Admitting: Physician Assistant

## 2020-05-19 ENCOUNTER — Ambulatory Visit: Payer: 59 | Attending: Physician Assistant | Admitting: Physician Assistant

## 2020-05-19 VITALS — BP 134/93 | HR 74 | Resp 18 | Ht 66.0 in | Wt 194.8 lb

## 2020-05-19 DIAGNOSIS — F419 Anxiety disorder, unspecified: Secondary | ICD-10-CM

## 2020-05-19 DIAGNOSIS — K219 Gastro-esophageal reflux disease without esophagitis: Secondary | ICD-10-CM

## 2020-05-19 DIAGNOSIS — F4323 Adjustment disorder with mixed anxiety and depressed mood: Secondary | ICD-10-CM

## 2020-05-19 DIAGNOSIS — Z13220 Encounter for screening for lipoid disorders: Secondary | ICD-10-CM

## 2020-05-19 DIAGNOSIS — I10 Essential (primary) hypertension: Secondary | ICD-10-CM

## 2020-05-19 DIAGNOSIS — R739 Hyperglycemia, unspecified: Secondary | ICD-10-CM

## 2020-05-19 MED ORDER — SERTRALINE HCL 100 MG PO TABS: 100.0000 mg | ORAL_TABLET | Freq: Every day | ORAL | 3 refills | Status: DC

## 2020-05-19 MED ORDER — ALPRAZOLAM 0.5 MG PO TABS: 0.2500 mg | ORAL_TABLET | Freq: Every day | ORAL | 0 refills | Status: DC | PRN

## 2020-05-19 MED ORDER — AMLODIPINE BESYLATE 10 MG PO TABS: 10.0000 mg | ORAL_TABLET | Freq: Every day | ORAL | 1 refills | Status: DC

## 2020-05-19 MED ORDER — GABAPENTIN 100 MG PO CAPS: 100.0000 mg | ORAL_CAPSULE | Freq: Three times a day (TID) | ORAL | 2 refills | Status: DC

## 2020-05-19 MED ORDER — OMEPRAZOLE 20 MG PO CPDR: 20.0000 mg | DELAYED_RELEASE_CAPSULE | Freq: Every day | ORAL | 3 refills | Status: DC

## 2020-05-19 NOTE — Patient Instructions (Signed)

## 2020-05-19 NOTE — Progress Notes (Signed)
Patient ID: Kara Hall, female   DOB: 07/08/1970, 50 y.o.   MRN: [id number]   Kara Hall, is a 50 y.o. female  GDJ:[id number]  QQI:[id number]  DOB - 09/30/1970  Subjective:  Chief Complaint and HPI: Kara Hall is a 50 y.o. female here today to establish care after Primary care at [place] closed.  Long h/o anxiety and depression.  I did explain to her that we will not RF xanax on an ongoing basis here.  No SI/HI.  She has been on 50 mg zoloft and has noticed some improvement.    Has had total hyst.  Suffers with sciatica and ankle pain followed by ortho.  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    hasn't taken blood pressure meds today  ED/Hospital notes reviewed.    ROS:   Constitutional:  No f/c, No night sweats, No unexplained weight loss. EENT:  No vision changes, No blurry vision, No hearing changes. No mouth, throat, or ear problems.  Respiratory: No cough, No SOB Cardiac: No CP, no palpitations GI:  No abd pain, No N/V/D. GU: No Urinary s/sx Musculoskeletal: see above Neuro: No headache, no dizziness, no motor weakness.  Skin: No rash Endocrine:  No polydipsia. No polyuria.  Psych: Denies SI/HI  No problems updated.  ALLERGIES: Allergies  Allergen Reactions  . Other     BLOOD REFUSAL   . Sulfa Antibiotics Other (See Comments) and Nausea And Vomiting    Severe bladder, and kidney infection  . Trazodone And Nefazodone Other (See Comments)    Headache every am     PAST MEDICAL HISTORY: Past Medical History:  Diagnosis Date  . Anemia   . Anxiety    panic attack, also reports problems with depression, relative to her job & upcoming surgery , relative to life problems - with children & spouse    . Back complaints 2013   has had steroid injection   . Depression   . DVT (deep venous thrombosis) ([place]) 2001   RLE  . Fibroids   . Foot fracture, left   . HTN (hypertension)    . Migraine    h/o migraines - as a teenager  & again now with family issues, states she has to lay down & rest in a dark room  (08/28/2013)  . Muscle strain of hand    wearing home made splint today, L hand  . Peripheral vascular disease (HCC)    DVT- per pt. in 2001- had vein removed.   . Refusal of blood transfusions as patient is Jehovah's Witness   . Thyroid cancer ([place])   . Thyroid nodule     MEDICATIONS AT HOME: Prior to Admission medications   Medication Sig Start Date End Date Taking? Authorizing Provider  diclofenac (VOLTAREN) 75 MG EC tablet Take 1 tablet (75 mg total) by mouth 2 (two) times daily. 12/27/19  Yes Maximiano Coss, NP  gabapentin (NEURONTIN) 100 MG capsule Take 1 capsule (100 mg total) by mouth 3 (three) times daily. 05/19/20  Yes Argentina Donovan, PA-C  HYDROcodone-acetaminophen (NORCO/VICODIN) 5-325 MG tablet Take 1 tablet by mouth 3 (three) times daily as needed for moderate pain. 05/18/20  Yes Dondra Prader R, NP  levothyroxine (SYNTHROID, LEVOTHROID) 125 MCG tablet Take 125 mcg by mouth daily before breakfast.    Yes [provider]  sertraline (ZOLOFT) 100 MG tablet Take 1 tablet (100 mg total) by mouth daily. 05/19/20  Yes Kristol Almanzar,  [name], PA-C  ALPRAZolam [name]) 0.5 MG tablet Take 0.5-1 tablets (0.25-0.5 mg total) by mouth daily as needed for anxiety. 05/19/20   Argentina Donovan, PA-C  amLODipine (NORVASC) 10 MG tablet Take 1 tablet (10 mg total) by mouth daily. 05/19/20   Argentina Donovan, PA-C  methocarbamol (ROBAXIN) 500 MG tablet Take 1 tablet by mouth 4 times daily Patient not taking: Reported on 04/12/2020 01/01/20   Maximiano Coss, NP  omeprazole (PRILOSEC) 20 MG capsule Take 1 capsule (20 mg total) by mouth daily. 05/19/20   Argentina Donovan, PA-C     Objective:  EXAM:   Vitals:   05/19/20 0901  BP: (!) 134/93  Pulse: 74  Resp: 18  SpO2: 96%  Weight: 194 lb 12.8 oz (88.4 kg)  Height: 5\' 6"  (1.676 m)    General appearance : A&OX3.  NAD. Non-toxic-appearing HEENT: Atraumatic and Normocephalic.  PERRLA. EOM intact.   Chest/Lungs:  Breathing-non-labored, Good air entry bilaterally, breath sounds normal without rales, rhonchi, or wheezing  CVS: S1 S2 regular, no murmurs, gallops, rubs  Extremities: Bilateral Lower Ext shows no edema, both legs are warm to touch with = pulse throughout Neurology:  CN II-XII grossly intact, Non focal.   Psych:  TP tangential. J/I fair. pressured speech. Appropriate eye contact and affect.  Skin:  No Rash  Data Review No results found for: HGBA1C   Assessment & Plan   1. Essential hypertension, benign Hasn't taken med today - Comprehensive metabolic panel - Lipid panel - CBC with Differential/Platelet - amLODipine (NORVASC) 10 MG tablet; Take 1 tablet (10 mg total) by mouth daily.  Dispense: 90 tablet; Refill: 1  2. Anxiety Will increase sertraline.  Explained we will not RF xanax here.  She does not daily daily-only prn.  Wean down.  - gabapentin (NEURONTIN) 100 MG capsule; Take 1 capsule (100 mg total) by mouth 3 (three) times daily.  Dispense: 90 capsule; Refill: 2 - sertraline (ZOLOFT) 100 MG tablet; Take 1 tablet (100 mg total) by mouth daily.  Dispense: 30 tablet; Refill: 3 - ALPRAZolam (XANAX) 0.5 MG tablet; Take 0.5-1 tablets (0.25-0.5 mg total) by mouth daily as needed for anxiety.  Dispense: 15 tablet; Refill: 0  3. Gastroesophageal reflux disease without esophagitis - omeprazole (PRILOSEC) 20 MG capsule; Take 1 capsule (20 mg total) by mouth daily.  Dispense: 30 capsule; Refill: 3  4. Adjustment disorder with mixed anxiety and depressed mood - sertraline (ZOLOFT) 100 MG tablet; Take 1 tablet (100 mg total) by mouth daily.  Dispense: 30 tablet; Refill: 3 - ALPRAZolam (XANAX) 0.5 MG tablet; Take 0.5-1 tablets (0.25-0.5 mg total) by mouth daily as needed for anxiety.  Dispense: 15 tablet; Refill: 0  5. Screening, lipid - Lipid panel  6. Hyperglycemia  I have had a  lengthy discussion and provided education about insulin resistance and the intake of too much sugar/refined carbohydrates.  I have advised the patient to work at a goal of eliminating sugary drinks, candy, desserts, sweets, refined sugars, processed foods, and white carbohydrates.  The patient expresses understanding.   - Comprehensive metabolic panel - Hemoglobin A1c - Lipid panel   Patient have been counseled extensively about nutrition and exercise  Return in about 3 months (around 08/19/2020) for PCP;  chronic conditions.  The patient was given clear instructions to go to ER or return to medical center if symptoms don't improve, worsen or new problems develop. The patient verbalized understanding. The patient was told to call to get lab  results if they haven't heard anything in the next week.     Freeman Caldron, PA-C [facility] [place], [place]   05/19/2020, 9:19 AM

## 2020-05-20 LAB — COMPREHENSIVE METABOLIC PANEL
ALT: 29 IU/L (ref 0–32)
AST: 22 IU/L (ref 0–40)
Albumin/Globulin Ratio: 1.7 (ref 1.2–2.2)
Albumin: 4.5 g/dL (ref 3.8–4.8)
Alkaline Phosphatase: 96 IU/L (ref 44–121)
BUN/Creatinine Ratio: 21 (ref 9–23)
BUN: 14 mg/dL (ref 6–24)
Bilirubin Total: 0.4 mg/dL (ref 0.0–1.2)
CO2: 20 mmol/L (ref 20–29)
Calcium: 8.7 mg/dL (ref 8.7–10.2)
Chloride: 100 mmol/L (ref 96–106)
Creatinine, Ser: 0.66 mg/dL (ref 0.57–1.00)
Globulin, Total: 2.7 g/dL (ref 1.5–4.5)
Glucose: 102 mg/dL — ABNORMAL HIGH (ref 65–99)
Potassium: 4.3 mmol/L (ref 3.5–5.2)
Sodium: 136 mmol/L (ref 134–144)
Total Protein: 7.2 g/dL (ref 6.0–8.5)
eGFR: 108 mL/min/{1.73_m2} (ref >59)

## 2020-05-20 LAB — CBC WITH DIFFERENTIAL/PLATELET
Basophils Absolute: 0.1 10*3/uL (ref 0.0–0.2)
Basos: 1 %
EOS (ABSOLUTE): 0.2 10*3/uL (ref 0.0–0.4)
Eos: 3 %
Hematocrit: 42.4 % (ref 34.0–46.6)
Hemoglobin: 14.5 g/dL (ref 11.1–15.9)
Immature Grans (Abs): 0 10*3/uL (ref 0.0–0.1)
Immature Granulocytes: 1 %
Lymphocytes Absolute: 1.5 10*3/uL (ref 0.7–3.1)
Lymphs: 20 %
MCH: 31.8 pg (ref 26.6–33.0)
MCHC: 34.2 g/dL (ref 31.5–35.7)
MCV: 93 fL (ref 79–97)
Monocytes Absolute: 0.6 10*3/uL (ref 0.1–0.9)
Monocytes: 8 %
Neutrophils Absolute: 4.9 10*3/uL (ref 1.4–7.0)
Neutrophils: 67 %
Platelets: 288 10*3/uL (ref 150–450)
RBC: 4.56 x10E6/uL (ref 3.77–5.28)
RDW: 12.2 % (ref 11.7–15.4)
WBC: 7.3 10*3/uL (ref 3.4–10.8)

## 2020-05-20 LAB — LIPID PANEL
Chol/HDL Ratio: 4.6 ratio — ABNORMAL HIGH (ref 0.0–4.4)
Cholesterol, Total: 274 mg/dL — ABNORMAL HIGH (ref 100–199)
HDL: 60 mg/dL (ref >39)
LDL Chol Calc (NIH): 195 mg/dL — ABNORMAL HIGH (ref 0–99)
Triglycerides: 110 mg/dL (ref 0–149)
VLDL Cholesterol Cal: 19 mg/dL (ref 5–40)

## 2020-05-20 LAB — HEMOGLOBIN A1C
Est. average glucose Bld gHb Est-mCnc: 108 mg/dL
Hgb A1c MFr Bld: 5.4 % (ref 4.8–5.6)

## 2020-05-24 ENCOUNTER — Telehealth: Payer: Self-pay | Admitting: Physical Medicine and Rehabilitation

## 2020-05-24 NOTE — Telephone Encounter (Signed)
Pt called and states she is getting an injection tomorrow at 2. She is very nervous and wondering if it okay if she takes like a pian pill or xanax before she comes in? Cb [phone]

## 2020-05-24 NOTE — Telephone Encounter (Signed)
Patient has prescriptions for both Xanax and Norco. She wants to know if it is ok to take one of each prior to her [facility] tomorrow.

## 2020-05-24 NOTE — Telephone Encounter (Signed)
If she normally takes them together then ok

## 2020-05-24 NOTE — Telephone Encounter (Signed)
Called patient to advise  °

## 2020-05-25 ENCOUNTER — Ambulatory Visit: Payer: Self-pay | Admitting: *Deleted

## 2020-05-25 ENCOUNTER — Ambulatory Visit (INDEPENDENT_AMBULATORY_CARE_PROVIDER_SITE_OTHER): Payer: 59 | Admitting: Physical Medicine and Rehabilitation

## 2020-05-25 ENCOUNTER — Other Ambulatory Visit: Payer: Self-pay

## 2020-05-25 ENCOUNTER — Telehealth: Payer: Self-pay | Admitting: Internal Medicine

## 2020-05-25 ENCOUNTER — Ambulatory Visit: Payer: Self-pay

## 2020-05-25 ENCOUNTER — Encounter: Payer: Self-pay | Admitting: Physical Medicine and Rehabilitation

## 2020-05-25 ENCOUNTER — Other Ambulatory Visit: Payer: Self-pay | Admitting: Physician Assistant

## 2020-05-25 VITALS — BP 118/83 | HR 84

## 2020-05-25 DIAGNOSIS — M5416 Radiculopathy, lumbar region: Secondary | ICD-10-CM

## 2020-05-25 MED ORDER — ATORVASTATIN CALCIUM 20 MG PO TABS: 20.0000 mg | ORAL_TABLET | Freq: Every day | ORAL | 3 refills | Status: DC

## 2020-05-25 MED ORDER — BETAMETHASONE SOD PHOS & ACET 6 (3-3) MG/ML IJ SUSP
12.0000 mg | Freq: Once | INTRAMUSCULAR | Status: AC
Start: 2020-05-25 — End: 2020-05-25
  Administered 2020-05-25: 12 mg

## 2020-05-25 NOTE — Telephone Encounter (Signed)
Patient inquiring about new prescription, Atorvastatin. She received Kenalog injection today and would like to start Atorvastatin several days.  Reviewed lab results and dietary changes with the patient to help improve her cholesterol numbers.  Reason for Disposition . Health Information question, no triage required and triager able to answer question  Answer Assessment - Initial Assessment Questions 1. REASON FOR CALL or QUESTION: "What is your reason for calling today?" or "How can I best help you?" or "What question do you have that I can help answer?"     Questions regarding new prescription Atorvastatin.  Protocols used: INFORMATION ONLY CALL - NO TRIAGE-A-AH

## 2020-05-25 NOTE — Procedures (Signed)
Lumbar Epidural Steroid Injection - Interlaminar Approach with Fluoroscopic Guidance  Patient: Kara [facility]      Date of Birth: April 11, 1970 MRN: [id number] PCP: Ladell Pier, MD      Visit Date: 05/25/2020   Universal Protocol:     Consent Given By: the patient  Position: PRONE  Additional Comments: Vital signs were monitored before and after the procedure. Patient was prepped and draped in the usual sterile fashion. The correct patient, procedure, and site was verified.   Injection Procedure Details:   Procedure diagnoses: Lumbar radiculopathy [M54.16]   Meds Administered:  Meds ordered this encounter  Medications  . betamethasone acetate-betamethasone sodium phosphate (CELESTONE) injection 12 mg     Laterality: Left  Location/Site:  L5-S1  Needle: 3.5 in., 20 ga. Tuohy  Needle Placement: Paramedian epidural  Findings:   -Comments: Excellent flow of contrast into the epidural space.  Procedure Details: Using a paramedian approach from the side mentioned above, the region overlying the inferior lamina was localized under fluoroscopic visualization and the soft tissues overlying this structure were infiltrated with 4 ml. of 1% Lidocaine without Epinephrine. The Tuohy needle was inserted into the epidural space using a paramedian approach.   The epidural space was localized using loss of resistance along with counter oblique bi-planar fluoroscopic views.  After negative aspirate for air, blood, and CSF, a 2 ml. volume of Isovue-250 was injected into the epidural space and the flow of contrast was observed. Radiographs were obtained for documentation purposes.    The injectate was administered into the level noted above.   Additional Comments:  No complications occurred Dressing: 2 x 2 sterile gauze and Band-Aid    Post-procedure details: Patient was observed during the procedure. Post-procedure instructions were reviewed.  Patient left the clinic in  stable condition.

## 2020-05-25 NOTE — Progress Notes (Signed)
Numeric Pain Rating Scale and Functional Assessment Average Pain 4   In the last MONTH (on 0-10 scale) has pain interfered with the following?  1. General activity like being  able to carry out your everyday physical activities such as walking, climbing stairs, carrying groceries, or moving a chair?  Rating(7)  Low back pain on the left side that radiates down into the left leg.  Numbness and tingling in left leg.  States it hurts every day.  +Driver, -BT, -Dye Allergies.

## 2020-05-25 NOTE — Telephone Encounter (Signed)
Patient informed of message below and voice understanding.

## 2020-05-25 NOTE — Telephone Encounter (Signed)
Called patient and LVM to call back.   If patient calls back please inform her to contact the billing department at [phone] and inform them that she has insurance and they should be able to adjust her bill.   Copied from [place] [id number]. Topic: General - Other >> May 25, 2020 12:53 PM Leward Quan A wrote: Reason for CRM: Patient called in to say that she received a bill for $109 and on the back of the bill stated that patient have no insurance. Patient has [employer] asking if this can be re billed and  if someone could please call her at  Ph# [phone]

## 2020-05-25 NOTE — Patient Instructions (Signed)

## 2020-05-25 NOTE — Progress Notes (Addendum)
[name] - 50 y.o. female MRN [id number]  Date of birth: [birth year]  Office Visit Note: Visit Date: 05/25/2020 PCP: Ladell Pier, MD Referred by: Ladell Pier, MD  Subjective: Chief Complaint  Patient presents with  . Lower Back - Pain   HPI:  Kara Hall is a 50 y.o. female who comes in today at the request of Dondra Prader, [place] for planned Left L5-S1 Lumbar epidural steroid injection with fluoroscopic guidance.  The patient has failed conservative care including home exercise, medications, time and activity modification.  This injection will be diagnostic and hopefully therapeutic.  Please see requesting physician notes for further details and justification.   ROS Otherwise per HPI.  Assessment & Plan: Visit Diagnoses:    ICD-10-CM   1. Lumbar radiculopathy  M54.16 XR C-ARM NO REPORT    Epidural Steroid injection    betamethasone acetate-betamethasone sodium phosphate (CELESTONE) injection 12 mg    Plan: No additional findings.   Meds & Orders:  Meds ordered this encounter  Medications  . betamethasone acetate-betamethasone sodium phosphate (CELESTONE) injection 12 mg    Orders Placed This Encounter  Procedures  . XR C-ARM NO REPORT  . Epidural Steroid injection    Follow-up: Return for visit to requesting physician as needed.   Procedures: No procedures performed  Lumbar Epidural Steroid Injection - Interlaminar Approach with Fluoroscopic Guidance  Patient: Kara [facility]      Date of Birth: 1970-05-25 MRN: [id number] PCP: Ladell Pier, MD      Visit Date: 05/25/2020   Universal Protocol:     Consent Given By: the patient  Position: PRONE  Additional Comments: Vital signs were monitored before and after the procedure. Patient was prepped and draped in the usual sterile fashion. The correct patient, procedure, and site was verified.   Injection Procedure Details:   Procedure diagnoses: Lumbar radiculopathy  [M54.16]   Meds Administered:  Meds ordered this encounter  Medications  . betamethasone acetate-betamethasone sodium phosphate (CELESTONE) injection 12 mg     Laterality: Left  Location/Site:  L5-S1  Needle: 3.5 in., 20 ga. Tuohy  Needle Placement: Paramedian epidural  Findings:   -Comments: Excellent flow of contrast into the epidural space.  Procedure Details: Using a paramedian approach from the side mentioned above, the region overlying the inferior lamina was localized under fluoroscopic visualization and the soft tissues overlying this structure were infiltrated with 4 ml. of 1% Lidocaine without Epinephrine. The Tuohy needle was inserted into the epidural space using a paramedian approach.   The epidural space was localized using loss of resistance along with counter oblique bi-planar fluoroscopic views.  After negative aspirate for air, blood, and CSF, a 2 ml. volume of Isovue-250 was injected into the epidural space and the flow of contrast was observed. Radiographs were obtained for documentation purposes.    The injectate was administered into the level noted above.   Additional Comments:  No complications occurred Dressing: 2 x 2 sterile gauze and Band-Aid    Post-procedure details: Patient was observed during the procedure. Post-procedure instructions were reviewed.  Patient left the clinic in stable condition.     Clinical History: No specialty comments available.     Objective:  VS:  HT:    WT:   BMI:     BP:118/83  HR:84bpm  TEMP: ( )  RESP:(!) 53 % Physical Exam Vitals and nursing note reviewed.  Constitutional:      General: She is not in acute distress.  Appearance: Normal appearance. She is not ill-appearing.  HENT:     Head: Normocephalic and atraumatic.     Right Ear: External ear normal.     Left Ear: External ear normal.  Eyes:     Extraocular Movements: Extraocular movements intact.  Cardiovascular:     Rate and Rhythm:  Normal rate.     Pulses: Normal pulses.  Pulmonary:     Effort: Pulmonary effort is normal. No respiratory distress.  Abdominal:     General: There is no distension.     Palpations: Abdomen is soft.  Musculoskeletal:        General: Tenderness present.     Cervical back: Neck supple.     Right lower leg: No edema.     Left lower leg: No edema.     Comments: Patient has good distal strength with no pain over the greater trochanters.  No clonus or focal weakness.  Skin:    Findings: No erythema, lesion or rash.  Neurological:     General: No focal deficit present.     Mental Status: She is alert and oriented to person, place, and time.     Sensory: No sensory deficit.     Motor: No weakness or abnormal muscle tone.     Coordination: Coordination normal.  Psychiatric:        Mood and Affect: Mood normal.        Behavior: Behavior normal.      Imaging: Epidural Steroid injection  Result Date: 05/25/2020 Magnus Sinning, MD     05/25/2020  2:25 PM Lumbar Epidural Steroid Injection - Interlaminar Approach with Fluoroscopic Guidance Patient: Kara Hall     Date of Birth: [birth year] MRN: [id number] PCP: Ladell Pier, MD     Visit Date: 05/25/2020  Universal Protocol:   Consent Given By: the patient Position: PRONE Additional Comments: Vital signs were monitored before and after the procedure. Patient was prepped and draped in the usual sterile fashion. The correct patient, procedure, and site was verified. Injection Procedure Details: Procedure diagnoses: Lumbar radiculopathy [M54.16] Meds Administered: Meds ordered this encounter Medications . betamethasone acetate-betamethasone sodium phosphate (CELESTONE) injection 12 mg  Laterality: Left Location/Site:  L5-S1 Needle: 3.5 in., 20 ga. Tuohy Needle Placement: Paramedian epidural Findings:  -Comments: Excellent flow of contrast into the epidural space. Procedure Details: Using a paramedian approach from the side mentioned above,  the region overlying the inferior lamina was localized under fluoroscopic visualization and the soft tissues overlying this structure were infiltrated with 4 ml. of 1% Lidocaine without Epinephrine. The Tuohy needle was inserted into the epidural space using a paramedian approach. The epidural space was localized using loss of resistance along with counter oblique bi-planar fluoroscopic views.  After negative aspirate for air, blood, and CSF, a 2 ml. volume of Isovue-250 was injected into the epidural space and the flow of contrast was observed. Radiographs were obtained for documentation purposes.  The injectate was administered into the level noted above. Additional Comments: No complications occurred Dressing: 2 x 2 sterile gauze and Band-Aid  Post-procedure details: Patient was observed during the procedure. Post-procedure instructions were reviewed. Patient left the clinic in stable condition.

## 2020-06-02 ENCOUNTER — Telehealth (INDEPENDENT_AMBULATORY_CARE_PROVIDER_SITE_OTHER): Payer: Self-pay

## 2020-06-02 NOTE — Telephone Encounter (Signed)
Patient verified date of birth. She is aware that labs are normal except cholesterol. She has already picked up Rx for atorvastatin. She verbalized understanding of results. Nat Christen, CMA

## 2020-06-02 NOTE — Telephone Encounter (Signed)
-----   Message from [place] Donovan, [name] sent at 05/25/2020 10:25 AM EDT ----- Labs look great overall except cholesterol.  I will send a prescription of lipitor for you to start. No diabetes.  Kidney function, liver function, electrolytes, blood count are all good.  Follow up as planned.  Thanks, Freeman Caldron, PA-C

## 2020-06-14 ENCOUNTER — Other Ambulatory Visit: Payer: Self-pay | Admitting: Physician Assistant

## 2020-06-14 ENCOUNTER — Telehealth: Payer: Self-pay | Admitting: Orthopedic Surgery

## 2020-06-14 DIAGNOSIS — G8929 Other chronic pain: Secondary | ICD-10-CM

## 2020-06-14 DIAGNOSIS — M5442 Lumbago with sciatica, left side: Secondary | ICD-10-CM

## 2020-06-14 MED ORDER — HYDROCODONE-ACETAMINOPHEN 5-325 MG PO TABS: 1.0000 | ORAL_TABLET | Freq: Three times a day (TID) | ORAL | 0 refills | Status: DC | PRN

## 2020-06-14 NOTE — Telephone Encounter (Signed)
Mri ordered meds refilled

## 2020-06-14 NOTE — Telephone Encounter (Signed)
Please see message below and advise.

## 2020-06-14 NOTE — Telephone Encounter (Signed)
Pt called stating she got a back inj on 05/25/20 and was advised that if the pain didn't get better after a week to CB, the pt waited a little over a week and states there's been no improvement. Pt states she discussed having an MRI done if the pain didn't get better and she would like to move forward with that. Lastly pt would like to know if she could have a refill of her norco rx while she waits for the MRI to be approved? Pt would like a CB to discuss further.   [phone]

## 2020-06-15 NOTE — Telephone Encounter (Signed)
I called and lm on vm to advise of message below. To call with questions.  

## 2020-06-21 ENCOUNTER — Encounter: Payer: Self-pay | Admitting: Family Medicine

## 2020-07-05 ENCOUNTER — Other Ambulatory Visit: Payer: Self-pay

## 2020-07-05 ENCOUNTER — Ambulatory Visit (INDEPENDENT_AMBULATORY_CARE_PROVIDER_SITE_OTHER): Payer: 59 | Admitting: Orthopedic Surgery

## 2020-07-05 ENCOUNTER — Encounter: Payer: Self-pay | Admitting: Orthopedic Surgery

## 2020-07-05 ENCOUNTER — Ambulatory Visit (INDEPENDENT_AMBULATORY_CARE_PROVIDER_SITE_OTHER): Payer: 59 | Admitting: Physical Medicine and Rehabilitation

## 2020-07-05 ENCOUNTER — Encounter: Payer: Self-pay | Admitting: Physical Medicine and Rehabilitation

## 2020-07-05 DIAGNOSIS — G8929 Other chronic pain: Secondary | ICD-10-CM

## 2020-07-05 DIAGNOSIS — M5116 Intervertebral disc disorders with radiculopathy, lumbar region: Secondary | ICD-10-CM | POA: Diagnosis not present

## 2020-07-05 DIAGNOSIS — M5416 Radiculopathy, lumbar region: Secondary | ICD-10-CM | POA: Diagnosis not present

## 2020-07-05 DIAGNOSIS — M5442 Lumbago with sciatica, left side: Secondary | ICD-10-CM

## 2020-07-05 DIAGNOSIS — M47816 Spondylosis without myelopathy or radiculopathy, lumbar region: Secondary | ICD-10-CM | POA: Diagnosis not present

## 2020-07-05 MED ORDER — HYDROCODONE-ACETAMINOPHEN 5-325 MG PO TABS: 1.0000 | ORAL_TABLET | Freq: Three times a day (TID) | ORAL | 0 refills | Status: DC | PRN

## 2020-07-05 MED ORDER — PREGABALIN 75 MG PO CAPS: ORAL_CAPSULE | ORAL | 0 refills | Status: DC

## 2020-07-05 NOTE — Progress Notes (Addendum)
Kara Hall - 50 y.o. female MRN [id number]  Date of birth: 03-07-1970  Office Visit Note: Visit Date: 07/05/2020 PCP: Ladell Pier, MD Referred by: Ladell Pier, MD  Subjective: Chief Complaint  Patient presents with   Lower Back - Pain   HPI: Kara Hall is a 50 y.o. female who comes in today following an appointment with Dr. Meridee Score who evaluated her for chronic left-sided lumbar radicular pain. Patient reports chronic, worsening, and severe left lower back pain that radiates to left buttock and leg. Patient reports pain has been ongoing intermittently for 3 years.  Patient reports her pain worsened after sustaining a fall at work in 2018 where she fell on her back and also fractured left fifth metatarsal. Patient reports her pain is worse with walking, bending over and prolonged standing. Patient states her pain is relieved by resting and sitting. Patient continues to take Hydrocodone at home prescribed by Dr. Meridee Score with some relief. Patient recently had MRI of lumbar spine and was able to bring disc with her to the office today. Patient is tearful upon arrival to the office today. Pt reports she is in excruciating pain today and would like to schedule an epidural steroid injection as soon as possible. Patient states her pain is beginning to affect her ability to work at her job and is causing her increased stress. Patient denies focal weakness numbness and tingling. Patient denies any recent trauma or falls.   Review of Systems  Musculoskeletal:  Positive for back pain and joint pain.  Neurological:  Negative for sensory change and focal weakness.  All other systems reviewed and are negative. Otherwise per HPI.  Assessment & Plan: Visit Diagnoses:    ICD-10-CM   1. Lumbar radiculopathy  M54.16     2. Intervertebral disc disorders with radiculopathy, lumbar region  M51.16     3. 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 Pt was seen in office today following her appointment with Dr. Meridee Score. Pt tearful and verbalizing severe left lower back pain radiating to buttock and down leg. Pt's previous lumbar MRI reviewed with her today in detail using images and spine model. MRI exhibits left paracentral disc protrusion at L4-5 abutting the left L5 nerve root in the lateral recess, facet joint arthritis, and marrow edema in left pedicle of L5, which could indicate a stress reaction. Based on pt's recent imaging and clinical presentation the next step would be to perform a diagnostic and hopefully therapeutic left L5 transforminal ESI. Pt reports she would like to continue Hydrocodone at home, but would also like a medication for radicular pain. Lyrica prescribed for patient today and will re-evaluate after injection is completed.    Meds & Orders:  Meds ordered this encounter  Medications   DISCONTD: pregabalin (LYRICA) 75 MG capsule    Sig: 1 capsule at night for 5-7 days, then 1 capsule 2 times a day    Dispense:  60 capsule    Refill:  0   pregabalin (LYRICA) 75 MG capsule    Sig: 1 capsule at night for 5-7 days, then 1 capsule 2 times a day    Dispense:  60 capsule    Refill:  0   No orders of the defined types were placed in this encounter.   Follow-up: Return in about 2 days (around 07/07/2020) for left  L5 transforminal ESI.   Procedures: No procedures performed      Clinical History: MRI lumbar spine:   INDICATION: Low back pain which radiates down the left leg.   TECHNIQUE: Sagittal and axial T1 and T2-weighted sequences were performed. Additional sagittal STIR images were performed.   COMPARISON: None available   FINDINGS:  #  Osseous structures: No compression fracture. There is marrow edema in the left pedicle of L5 suggesting a stress reaction.  #  Alignment: Normal.  #  Modic changes: Mild type I Modic change in the endplates  adjacent to the degenerated L4-5 disc.  #  Conus medullaris: Normal. Terminates at L1 with no evidence of tethering.  #  Lower thoracic segments: No significant abnormality.  #  Additional findings: Bilateral ovarian cystic lesions. The one on the left measures at least 3.5 cm.   #  L1-2: Unremarkable  #    #  L2-3: Unremarkable  #    #  L3-4: Mild facet joint arthritis. No spinal or foraminal stenosis.  #    #  L4-5: Central disc protrusion with impingement on the thecal sac. Moderate facet joint arthritis.  #    #  L5-S1: Mild degenerative disc disease. Mild facet joint arthritis on the right and moderate facet joint arthritis on the left.    IMPRESSION:  1.  Central disc protrusion L4-5 with impingement on the thecal sac.  2.  Facet joint arthritis lower lumbar spine.  3.  Marrow edema in the left pedicle of L5 suggesting a stress reaction.   Electronically Signed by: Ross Marcus on 06/29/2020 1:05 PM   She reports that she has never smoked. She has never used smokeless tobacco.  Recent Labs    05/19/20 0926  HGBA1C 5.4    Objective:  VS:  HT:    WT:   BMI:     BP:   HR: bpm  TEMP: ( )  RESP:  Physical Exam HENT:     Head: Normocephalic.     Right Ear: Tympanic membrane normal.     Left Ear: Tympanic membrane normal.     Nose: Nose normal.     Mouth/Throat:     Mouth: Mucous membranes are moist.  Eyes:     Pupils: Pupils are equal, round, and reactive to light.  Cardiovascular:     Rate and Rhythm: Normal rate and regular rhythm.     Pulses: Normal pulses.  Pulmonary:     Effort: Pulmonary effort is normal.  Abdominal:     General: There is no distension.  Musculoskeletal:     Cervical back: Normal range of motion and neck supple.     Comments: Pt standing with forward flexed lumbar spine. Tenderness noted upon palpation to left lower back. Full range of motion. Strong distal strength without clonus, no pain upon palpation of greater trochanters. Sensation  intact bilaterally.  She has dysesthesia in the left L5 dermatome walks independently, gait steady.   Skin:    General: Skin is warm.     Capillary Refill: Capillary refill takes less than 2 seconds.  Neurological:     General: No focal deficit present.     Mental Status: She is alert and oriented to person, place, and time.  Psychiatric:        Mood and Affect: Mood normal.    Ortho Exam  Imaging: No results found.  Past Medical/Family/Surgical/Social History: Medications & Allergies reviewed per EMR, new medications updated. Patient Active Problem List  Diagnosis Date Noted   Status post hysterectomy 12/18/2018   Prolonged Q-T interval on ECG 11/24/2018   Essential hypertension, benign 11/24/2018   Irregular periods 10/08/2018   Leiomyoma 10/08/2018   Dyspareunia in female 03/24/2018   Increased body mass index 03/24/2018   Carpal tunnel syndrome on both sides 12/10/2017   Achilles tendon contracture, left 10/04/2016   Displaced fracture of fifth metatarsal bone of left foot with delayed healing 05/07/2016   Bilateral wrist pain 11/28/2015   History of thyroid cancer 08/09/2013   Anxiety 01/12/2013   Past Medical History:  Diagnosis Date   Anemia    Anxiety    panic attack, also reports problems with depression, relative to her job & upcoming surgery , relative to life problems - with children & spouse     Back complaints 2013   has had steroid injection    Depression    DVT (deep venous thrombosis) ([place]) 2001   RLE   Fibroids    Foot fracture, left    HTN (hypertension)    Migraine    h/o migraines - as a teenager  & again now with family issues, states she has to lay down & rest in a dark room  (08/28/2013)   Muscle strain of hand    wearing home made splint today, L hand   Peripheral vascular disease ([place])    DVT- per pt. in 2001- had vein removed.    Refusal of blood transfusions as patient is Jehovah's Witness    Thyroid cancer ([place])    Thyroid nodule     Family History  Problem Relation Age of Onset   Heart disease Mother    Hyperlipidemia Mother    Heart disease Father    Hyperlipidemia Father    Diabetes Father    Heart disease Brother    Hyperlipidemia Brother    Hypertension Brother    Heart disease Brother    Hyperlipidemia Brother    Past Surgical History:  Procedure Laterality Date   CYSTOSCOPY N/A 12/18/2018   Procedure: CYSTOSCOPY;  Surgeon: Jonelle Sidle, MD;  Location: [facility];  Service: Gynecology;  Laterality: N/A;   LAPAROSCOPIC LYSIS OF ADHESIONS N/A 12/18/2018   Procedure: LAPAROSCOPIC LYSIS OF ADHESIONS;  Surgeon: Jonelle Sidle, MD;  Location: [facility];  Service: Gynecology;  Laterality: N/A;   ROBOTIC ASSISTED LAPAROSCOPIC HYSTERECTOMY AND SALPINGECTOMY Bilateral 12/18/2018   Procedure: XI ROBOTIC ASSISTED LAPAROSCOPIC HYSTERECTOMY AND BILATERAL SALPINGECTOMY;  Surgeon: Jonelle Sidle, MD;  Location: [facility];  Service: Gynecology;  Laterality: Bilateral;   THROMBECTOMY Right 2001   leg; after 3rd child was born; for deep vein thrombosis   THYROIDECTOMY Right 08/28/2013   Procedure: RIGHT THYROID LOBECTOMY POSSIBLE TOTAL THYROIDECTOMY;  Surgeon: Jodi Marble, MD;  Location: [facility];  Service: ENT;  Laterality: Right;   THYROIDECTOMY Left 09/10/2013   Procedure: COMPLETE LEFT THYROIDECTOMY;  Surgeon: Jodi Marble, MD;  Location: [facility];  Service: ENT;  Laterality: Left;   TUBAL LIGATION  2001   Social History   Occupational History   Occupation: [employer]   Occupation: Cosmetologist    Employer: [employer]  Tobacco Use   Smoking status: Never   Smokeless tobacco: Never  Vaping Use   Vaping Use: Never used  Substance and Sexual Activity   Alcohol use: Yes    Comment: occ   Drug use: No   Sexual activity: Yes    Partners: Male    Birth control/protection:  Surgical    Comment: Tubal ligation

## 2020-07-05 NOTE — Progress Notes (Signed)
Patient is a 50 year old woman who still has persistent left-sided radicular pain she underwent a epidural steroid injection with Dr. Ernestina Patches at L5-S1 on May 11.  She still had persistent symptoms and underwent MRI scan on June 14.  She states she still has painful left-sided radicular pain that was unrelieved with her injection.  We will have the patient go upstairs to be evaluated by Dr. Romona Curls team and I will call in a prescription for her hydrocodone.

## 2020-07-05 NOTE — Progress Notes (Signed)
Wants an injection even if insurance will not approve. Left sided low back pain. Radiates down posterior left leg to ankle.  Injection in May gave her about 20% relief for a short time.

## 2020-07-07 ENCOUNTER — Ambulatory Visit: Payer: Self-pay

## 2020-07-07 ENCOUNTER — Ambulatory Visit (INDEPENDENT_AMBULATORY_CARE_PROVIDER_SITE_OTHER): Payer: 59 | Admitting: Physical Medicine and Rehabilitation

## 2020-07-07 ENCOUNTER — Other Ambulatory Visit: Payer: Self-pay

## 2020-07-07 ENCOUNTER — Encounter: Payer: Self-pay | Admitting: Physical Medicine and Rehabilitation

## 2020-07-07 VITALS — BP 124/86 | HR 76

## 2020-07-07 DIAGNOSIS — M5416 Radiculopathy, lumbar region: Secondary | ICD-10-CM | POA: Diagnosis not present

## 2020-07-07 MED ORDER — METHYLPREDNISOLONE ACETATE 80 MG/ML IJ SUSP
80.0000 mg | Freq: Once | INTRAMUSCULAR | Status: AC
  Administered 2020-07-07: 80 mg

## 2020-07-07 NOTE — Progress Notes (Signed)
Kara Hall - 50 y.o. female MRN [id number]  Date of birth: [birth year]  Office Visit Note: Visit Date: 07/07/2020 PCP: Ladell Pier, MD Referred by: Ladell Pier, MD  Subjective: Chief Complaint  Patient presents with   Lower Back - Pain   Left Leg - Pain   HPI:  Kara Hall is a 50 y.o. female who comes in today for planned Left L5-S1 Lumbar Transforaminal epidural steroid injection with fluoroscopic guidance.  The patient has failed conservative care including home exercise, medications, time and activity modification.  This injection will be diagnostic and hopefully therapeutic.  Please see requesting physician notes for further details and justification. MRI reviewed with images and spine model.  MRI reviewed in the note below.     ROS Otherwise per HPI.  Assessment & Plan: Visit Diagnoses:    ICD-10-CM   1. Lumbar radiculopathy  M54.16 XR C-ARM NO REPORT    Epidural Steroid injection    methylPREDNISolone acetate (DEPO-MEDROL) injection 80 mg      Plan: No additional findings.   Meds & Orders:  Meds ordered this encounter  Medications   methylPREDNISolone acetate (DEPO-MEDROL) injection 80 mg    Orders Placed This Encounter  Procedures   XR C-ARM NO REPORT   Epidural Steroid injection    Follow-up: Return if symptoms worsen or fail to improve.   Procedures: No procedures performed  Lumbosacral Transforaminal Epidural Steroid Injection - Sub-Pedicular Approach with Fluoroscopic Guidance  Patient: Kara Hall      Date of Birth: [birth year] MRN: [id number] PCP: Ladell Pier, MD      Visit Date: 07/07/2020   Universal Protocol:    Date/Time: 07/07/2020  Consent Given By: the patient  Position: PRONE  Additional Comments: Vital signs were monitored before and after the procedure. Patient was prepped and draped in the usual sterile fashion. The correct patient, procedure, and site was verified.   Injection  Procedure Details:   Procedure diagnoses: Lumbar radiculopathy [M54.16]    Meds Administered:  Meds ordered this encounter  Medications   methylPREDNISolone acetate (DEPO-MEDROL) injection 80 mg    Laterality: Left  Location/Site:  L5-S1  Needle:5.0 in., 22 ga.  Short bevel or Quincke spinal needle  Needle Placement: Transforaminal  Findings:    -Comments: Excellent flow of contrast along the nerve, nerve root and into the epidural space.  Procedure Details: After squaring off the end-plates to get a true AP view, the C-arm was positioned so that an oblique view of the foramen as noted above was visualized. The target area is just inferior to the "nose of the scotty dog" or sub pedicular. The soft tissues overlying this structure were infiltrated with 2-3 ml. of 1% Lidocaine without Epinephrine.  The spinal needle was inserted toward the target using a "trajectory" view along the fluoroscope beam.  Under AP and lateral visualization, the needle was advanced so it did not puncture dura and was located close the 6 O'Clock position of the pedical in AP tracterory. Biplanar projections were used to confirm position. Aspiration was confirmed to be negative for CSF and/or blood. A 1-2 ml. volume of Isovue-250 was injected and flow of contrast was noted at each level. Radiographs were obtained for documentation purposes.   After attaining the desired flow of contrast documented above, a 0.5 to 1.0 ml test dose of 0.25% Marcaine was injected into each respective transforaminal space.  The patient was observed for 90 seconds post injection.  After no sensory  deficits were reported, and normal lower extremity motor function was noted,   the above injectate was administered so that equal amounts of the injectate were placed at each foramen (level) into the transforaminal epidural space.   Additional Comments:  The patient tolerated the procedure well Dressing: 2 x 2 sterile gauze and Band-Aid     Post-procedure details: Patient was observed during the procedure. Post-procedure instructions were reviewed.  Patient left the clinic in stable condition.    Clinical History: MRI lumbar spine:   INDICATION: Low back pain which radiates down the left leg.   TECHNIQUE: Sagittal and axial T1 and T2-weighted sequences were performed. Additional sagittal STIR images were performed.   COMPARISON: None available   FINDINGS:  #  Osseous structures: No compression fracture. There is marrow edema in the left pedicle of L5 suggesting a stress reaction.  #  Alignment: Normal.  #  Modic changes: Mild type I Modic change in the endplates adjacent to the degenerated L4-5 disc.  #  Conus medullaris: Normal. Terminates at L1 with no evidence of tethering.  #  Lower thoracic segments: No significant abnormality.  #  Additional findings: Bilateral ovarian cystic lesions. The one on the left measures at least 3.5 cm.   #  L1-2: Unremarkable  #    #  L2-3: Unremarkable  #    #  L3-4: Mild facet joint arthritis. No spinal or foraminal stenosis.  #    #  L4-5: Central disc protrusion with impingement on the thecal sac. Moderate facet joint arthritis.  #    #  L5-S1: Mild degenerative disc disease. Mild facet joint arthritis on the right and moderate facet joint arthritis on the left.    IMPRESSION:  1.  Central disc protrusion L4-5 with impingement on the thecal sac.  2.  Facet joint arthritis lower lumbar spine.  3.  Marrow edema in the left pedicle of L5 suggesting a stress reaction.   Electronically Signed by: Ross Marcus on 06/29/2020 1:05 PM     Objective:  VS:  HT:    WT:   BMI:     BP:124/86  HR:76bpm  TEMP: ( )  RESP:  Physical Exam Vitals and nursing note reviewed.  Constitutional:      General: She is not in acute distress.    Appearance: Normal appearance. She is not ill-appearing.  HENT:     Head: Normocephalic and atraumatic.     Right Ear: External ear normal.      Left Ear: External ear normal.  Eyes:     Extraocular Movements: Extraocular movements intact.  Cardiovascular:     Rate and Rhythm: Normal rate.     Pulses: Normal pulses.  Pulmonary:     Effort: Pulmonary effort is normal. No respiratory distress.  Abdominal:     General: There is no distension.     Palpations: Abdomen is soft.  Musculoskeletal:        General: Tenderness present.     Cervical back: Neck supple.     Right lower leg: No edema.     Left lower leg: No edema.     Comments: Patient has good distal strength with no pain over the greater trochanters.  No clonus or focal weakness.  Skin:    Findings: No erythema, lesion or rash.  Neurological:     General: No focal deficit present.     Mental Status: She is alert and oriented to person, place, and time.  Sensory: No sensory deficit.     Motor: No weakness or abnormal muscle tone.     Coordination: Coordination normal.  Psychiatric:        Mood and Affect: Mood normal.        Behavior: Behavior normal.     Imaging: No results found.

## 2020-07-07 NOTE — Progress Notes (Signed)
Pt state lower back pain that travels down left leg. Pt state walking and standing for a long time makes the pain worse. Pt state she takes pain meds to help ease her pain.  Numeric Pain Rating Scale and Functional Assessment Average Pain 8   In the last MONTH (on 0-10 scale) has pain interfered with the following?  1. General activity like being  able to carry out your everyday physical activities such as walking, climbing stairs, carrying groceries, or moving a chair?  Rating(10)   +Driver, -BT, -Dye Allergies.

## 2020-07-07 NOTE — Patient Instructions (Signed)

## 2020-07-10 NOTE — Procedures (Signed)
Lumbosacral Transforaminal Epidural Steroid Injection - Sub-Pedicular Approach with Fluoroscopic Guidance  Patient: Kara Hall      Date of Birth: 08/22/1970 MRN: [id number] PCP: Ladell Pier, MD      Visit Date: 07/07/2020   Universal Protocol:    Date/Time: 07/07/2020  Consent Given By: the patient  Position: PRONE  Additional Comments: Vital signs were monitored before and after the procedure. Patient was prepped and draped in the usual sterile fashion. The correct patient, procedure, and site was verified.   Injection Procedure Details:   Procedure diagnoses: Lumbar radiculopathy [M54.16]    Meds Administered:  Meds ordered this encounter  Medications   methylPREDNISolone acetate (DEPO-MEDROL) injection 80 mg    Laterality: Left  Location/Site:  L5-S1  Needle:5.0 in., 22 ga.  Short bevel or Quincke spinal needle  Needle Placement: Transforaminal  Findings:    -Comments: Excellent flow of contrast along the nerve, nerve root and into the epidural space.  Procedure Details: After squaring off the end-plates to get a true AP view, the C-arm was positioned so that an oblique view of the foramen as noted above was visualized. The target area is just inferior to the "nose of the scotty dog" or sub pedicular. The soft tissues overlying this structure were infiltrated with 2-3 ml. of 1% Lidocaine without Epinephrine.  The spinal needle was inserted toward the target using a "trajectory" view along the fluoroscope beam.  Under AP and lateral visualization, the needle was advanced so it did not puncture dura and was located close the 6 O'Clock position of the pedical in AP tracterory. Biplanar projections were used to confirm position. Aspiration was confirmed to be negative for CSF and/or blood. A 1-2 ml. volume of Isovue-250 was injected and flow of contrast was noted at each level. Radiographs were obtained for documentation purposes.   After attaining the  desired flow of contrast documented above, a 0.5 to 1.0 ml test dose of 0.25% Marcaine was injected into each respective transforaminal space.  The patient was observed for 90 seconds post injection.  After no sensory deficits were reported, and normal lower extremity motor function was noted,   the above injectate was administered so that equal amounts of the injectate were placed at each foramen (level) into the transforaminal epidural space.   Additional Comments:  The patient tolerated the procedure well Dressing: 2 x 2 sterile gauze and Band-Aid    Post-procedure details: Patient was observed during the procedure. Post-procedure instructions were reviewed.  Patient left the clinic in stable condition.

## 2020-08-03 ENCOUNTER — Other Ambulatory Visit: Payer: Self-pay | Admitting: Physical Medicine and Rehabilitation

## 2020-08-03 NOTE — Telephone Encounter (Signed)
Please advise 

## 2020-08-04 ENCOUNTER — Ambulatory Visit: Payer: 59 | Admitting: Sports Medicine

## 2020-08-04 ENCOUNTER — Ambulatory Visit: Payer: 59

## 2020-08-04 ENCOUNTER — Other Ambulatory Visit: Payer: Self-pay

## 2020-08-04 ENCOUNTER — Encounter: Payer: Self-pay | Admitting: Sports Medicine

## 2020-08-04 DIAGNOSIS — M722 Plantar fascial fibromatosis: Secondary | ICD-10-CM

## 2020-08-04 DIAGNOSIS — M216X1 Other acquired deformities of right foot: Secondary | ICD-10-CM | POA: Diagnosis not present

## 2020-08-04 DIAGNOSIS — M79672 Pain in left foot: Secondary | ICD-10-CM

## 2020-08-04 DIAGNOSIS — M216X2 Other acquired deformities of left foot: Secondary | ICD-10-CM

## 2020-08-04 DIAGNOSIS — M79671 Pain in right foot: Secondary | ICD-10-CM | POA: Diagnosis not present

## 2020-08-04 MED ORDER — TRIAMCINOLONE ACETONIDE 10 MG/ML IJ SUSP
10.0000 mg | Freq: Once | INTRAMUSCULAR | Status: AC
  Administered 2020-08-04: 10 mg

## 2020-08-04 MED ORDER — TRAMADOL HCL 50 MG PO TABS: 50.0000 mg | ORAL_TABLET | Freq: Three times a day (TID) | ORAL | 0 refills | Status: AC | PRN

## 2020-08-04 NOTE — Progress Notes (Signed)
Subjective: Kara Hall is a 50 y.o. female patient returns office for follow-up evaluation of Right and left heel. Reports that her Planter fasciitis has flared up in both feet and that she is having some swelling and some radiating pain on the lateral side of the right foot.  Denies injury or other changes except recently having a back injection struggling with sciatica and low back pain.  Patient Active Problem List   Diagnosis Date Noted   Status post hysterectomy 12/18/2018   Prolonged Q-T interval on ECG 11/24/2018   Essential hypertension, benign 11/24/2018   Irregular periods 10/08/2018   Leiomyoma 10/08/2018   Dyspareunia in female 03/24/2018   Increased body mass index 03/24/2018   Carpal tunnel syndrome on both sides 12/10/2017   Achilles tendon contracture, left 10/04/2016   Displaced fracture of fifth metatarsal bone of left foot with delayed healing 05/07/2016   Bilateral wrist pain 11/28/2015   History of thyroid cancer 08/09/2013   Anxiety 01/12/2013    Current Outpatient Medications on File Prior to Visit  Medication Sig Dispense Refill   ALPRAZolam (XANAX) 0.5 MG tablet Take 0.5-1 tablets (0.25-0.5 mg total) by mouth daily as needed for anxiety. 15 tablet 0   amLODipine (NORVASC) 10 MG tablet Take 1 tablet (10 mg total) by mouth daily. 90 tablet 1   atorvastatin (LIPITOR) 20 MG tablet Take 1 tablet (20 mg total) by mouth daily. 90 tablet 3   benzonatate (TESSALON) 100 MG capsule      diclofenac (VOLTAREN) 75 MG EC tablet Take 1 tablet (75 mg total) by mouth 2 (two) times daily. 60 tablet 0   erythromycin ophthalmic ointment SMARTSIG:1 CENTIMETER(S) In Eye(s) Twice Daily     HYDROcodone-acetaminophen (NORCO/VICODIN) 5-325 MG tablet Take 1 tablet by mouth 3 (three) times daily as needed for moderate pain. 20 tablet 0   ID NOW COVID-19 KIT TEST AS DIRECTED TODAY     levothyroxine (SYNTHROID, LEVOTHROID) 125 MCG tablet Take 125 mcg by mouth daily before  breakfast.      methocarbamol (ROBAXIN) 500 MG tablet Take 1 tablet by mouth 4 times daily 60 tablet 0   mupirocin ointment (BACTROBAN) 2 % APPLY OINTMENT TOPICALLY TO AFFECTED AREA TWICE DAILY TO WOUND     omeprazole (PRILOSEC) 20 MG capsule Take 1 capsule (20 mg total) by mouth daily. 30 capsule 3   pregabalin (LYRICA) 75 MG capsule Take 1 capsule (75 mg total) by mouth 2 (two) times daily. TAKE 1 CAPSULE BY MOUTH AT NIGHT FOR 5 TO 7 DAYS THEN TAKE 1 CAPSULE BY MOUTH TWICE DAILY 60 capsule 1   sertraline (ZOLOFT) 100 MG tablet Take 1 tablet (100 mg total) by mouth daily. 30 tablet 3   sertraline (ZOLOFT) 50 MG tablet      Current Facility-Administered Medications on File Prior to Visit  Medication Dose Route Frequency Provider Last Rate Last Admin   triamcinolone acetonide (KENALOG) 10 MG/ML injection 10 mg  10 mg Other Once Landis Martins, DPM        Allergies  Allergen Reactions   Other     BLOOD REFUSAL    Sulfa Antibiotics Other (See Comments) and Nausea And Vomiting    Severe bladder, and kidney infection   Trazodone And Nefazodone Other (See Comments)    Headache every am     Objective: Physical Exam General: The patient is alert and oriented x3 in no acute distress.  Dermatology: Skin is warm, dry and supple bilateral lower extremities. Nails 1-10 are  normal. There is no erythema, edema, no eccymosis, no open lesions present. Integument is otherwise unremarkable.  Vascular: Dorsalis Pedis pulse and Posterior Tibial pulse are 2/4 bilateral. Capillary fill time is immediate to all digits.  Neurological: Grossly intact to light touch bilateral.  Musculoskeletal: Tenderness to palpation at the medial calcaneal tubercale and through the insertion of the plantar fascia on the right and left foot.  There is tenderness with palpation to the right lateral foot along the fourth and fifth toes.  There is no pain with compression of calcaneus bilateral. No pain with calf compression  bilateral. There is decreased Ankle joint range of motion bilateral. All other joints range of motion within normal limits bilateral. Strength 5/5 in all groups bilateral.   Assessment and Plan: Problem List Items Addressed This Visit   None Visit Diagnoses     Plantar fasciitis, bilateral    -  Primary   Inflammatory pain of right heel       Inflammatory pain of left heel       Acquired equinus deformity of both feet          -Complete examination performed.  -Re-Discussed with patient in detail the condition of plantar fasciitis bilateral and likely right foot lateral compensation pain -Patient declined a new set of x-rays due to concern about insurance coverage or cost this visit -After oral consent and aseptic prep, injected a mixture containing 1 ml of 2%  plain lidocaine, 1 ml 0.5% plain marcaine, 0.5 ml of kenalog 10 and 0.5 ml of dexamethasone phosphate into right and left plantar fascia without complication. Post-injection care discussed with patient.  This is injection #1 for the year. -Advised patient to strictly think about physical therapy, shockwave treatment or surgery because we cannot keep giving her injections all the time or pain medicine -For this time only prescribed tramadol for patient's severe pain -Patient to return to office PRN for follow-up evaluatn or sooner if problems or issues arise.  Advised patient once she has decided what more advanced treatment option she wants to try next she should call office for me to arrange physical therapy EPAT or surgery.  , DPM   

## 2020-08-19 ENCOUNTER — Encounter: Payer: Self-pay | Admitting: Internal Medicine

## 2020-08-19 ENCOUNTER — Ambulatory Visit: Payer: 59 | Attending: Internal Medicine | Admitting: Internal Medicine

## 2020-08-19 ENCOUNTER — Other Ambulatory Visit: Payer: Self-pay

## 2020-08-19 VITALS — BP 126/99 | HR 79 | Resp 16 | Wt 196.4 lb

## 2020-08-19 DIAGNOSIS — E782 Mixed hyperlipidemia: Secondary | ICD-10-CM

## 2020-08-19 DIAGNOSIS — Z6379 Other stressful life events affecting family and household: Secondary | ICD-10-CM

## 2020-08-19 DIAGNOSIS — F419 Anxiety disorder, unspecified: Secondary | ICD-10-CM | POA: Diagnosis not present

## 2020-08-19 DIAGNOSIS — E669 Obesity, unspecified: Secondary | ICD-10-CM

## 2020-08-19 DIAGNOSIS — I10 Essential (primary) hypertension: Secondary | ICD-10-CM | POA: Diagnosis not present

## 2020-08-19 DIAGNOSIS — M722 Plantar fascial fibromatosis: Secondary | ICD-10-CM

## 2020-08-19 DIAGNOSIS — Z1211 Encounter for screening for malignant neoplasm of colon: Secondary | ICD-10-CM

## 2020-08-19 DIAGNOSIS — F32A Depression, unspecified: Secondary | ICD-10-CM

## 2020-08-19 MED ORDER — VALSARTAN 40 MG PO TABS
40.0000 mg | ORAL_TABLET | Freq: Every day | ORAL | 2 refills | Status: DC
Start: 2020-08-19 — End: 2020-11-11

## 2020-08-19 MED ORDER — NAPROXEN 250 MG PO TABS: 250.0000 mg | ORAL_TABLET | Freq: Two times a day (BID) | ORAL | 0 refills | Status: DC | PRN

## 2020-08-19 MED ORDER — PRAVASTATIN SODIUM 10 MG PO TABS: ORAL_TABLET | ORAL | 2 refills | Status: DC

## 2020-08-19 NOTE — Patient Instructions (Addendum)
Stop atorvastatin.  Start Pravachol instead as discussed today.  Your blood pressure is not controlled.  We have added a new medication called Diovan 40 mg daily.  Follow-up with our clinical pharmacist in 2 weeks for repeat blood pressure check and to check your kidney function on the medication.  Continue the amlodipine.  Remember to call your insurance to inquire about colon cancer screening coverage.  Healthy Eating Following a healthy eating pattern may help you to achieve and maintain a healthy body weight, reduce the risk of chronic disease, and live a long and productive life. It is important to follow a healthy eating pattern at an appropriate calorie level for your body. Your nutritional needs should be metprimarily through food by choosing a variety of nutrient-rich foods. What are tips for following this plan? Reading food labels Read labels and choose the following: Reduced or low sodium. Juices with 100% fruit juice. Foods with low saturated fats and high polyunsaturated and monounsaturated fats. Foods with whole grains, such as whole wheat, cracked wheat, brown rice, and wild rice. Whole grains that are fortified with folic acid. This is recommended for women who are pregnant or who want to become pregnant. Read labels and avoid the following: Foods with a lot of added sugars. These include foods that contain brown sugar, corn sweetener, corn syrup, dextrose, fructose, glucose, high-fructose corn syrup, honey, invert sugar, lactose, malt syrup, maltose, molasses, raw sugar, sucrose, trehalose, or turbinado sugar. Do not eat more than the following amounts of added sugar per day: 6 teaspoons (25 g) for women. 9 teaspoons (38 g) for men. Foods that contain processed or refined starches and grains. Refined grain products, such as white flour, degermed cornmeal, white bread, and white rice. Shopping Choose nutrient-rich snacks, such as vegetables, whole fruits, and nuts. Avoid  high-calorie and high-sugar snacks, such as potato chips, fruit snacks, and candy. Use oil-based dressings and spreads on foods instead of solid fats such as butter, stick margarine, or cream cheese. Limit pre-made sauces, mixes, and "instant" products such as flavored rice, instant noodles, and ready-made pasta. Try more plant-protein sources, such as tofu, tempeh, black beans, edamame, lentils, nuts, and seeds. Explore eating plans such as the Mediterranean diet or vegetarian diet. Cooking Use oil to saut or stir-fry foods instead of solid fats such as butter, stick margarine, or lard. Try baking, boiling, grilling, or broiling instead of frying. Remove the fatty part of meats before cooking. Steam vegetables in water or broth. Meal planning  At meals, imagine dividing your plate into fourths: One-half of your plate is fruits and vegetables. One-fourth of your plate is whole grains. One-fourth of your plate is protein, especially lean meats, poultry, eggs, tofu, beans, or nuts. Include low-fat dairy as part of your daily diet.  Lifestyle Choose healthy options in all settings, including home, work, school, restaurants, or stores. Prepare your food safely: Wash your hands after handling raw meats. Keep food preparation surfaces clean by regularly washing with hot, soapy water. Keep raw meats separate from ready-to-eat foods, such as fruits and vegetables. Cook seafood, meat, poultry, and eggs to the recommended internal temperature. Store foods at safe temperatures. In general: Keep cold foods at 72F (4.4C) or below. Keep hot foods at 172F (60C) or above. Keep your freezer at [facility] (-17.8C) or below. Foods are no longer safe to eat when they have been between the temperatures of 40-172F (4.4-60C) for more than 2 hours. What foods should I eat? Fruits Aim to eat 2  cup-equivalents of fresh, canned (in natural juice), or frozen fruits each day. Examples of 1 cup-equivalent of  fruit include 1 small apple, 8large strawberries, 1 cup canned fruit,  cup dried fruit, or 1 cup 100% juice. Vegetables Aim to eat 2-3 cup-equivalents of fresh and frozen vegetables each day, including different varieties and colors. Examples of 1 cup-equivalent of vegetables include 2 medium carrots, 2 cups raw, leafy greens, 1 cup choppedvegetable (raw or cooked), or 1 medium baked potato. Grains Aim to eat 6 ounce-equivalents of whole grains each day. Examples of 1 ounce-equivalent of grains include 1 slice of bread, 1 cup ready-to-eat cereal,3 cups popcorn, or  cup cooked rice, pasta, or cereal. Meats and other proteins Aim to eat 5-6 ounce-equivalents of protein each day. Examples of 1 ounce-equivalent of protein include 1 egg, 1/2 cup nuts or seeds, or 1 tablespoon (16 g) peanut butter. A cut of meat or fish that is the size of a deck of cards is about 3-4 ounce-equivalents. Of the protein you eat each week, try to have at least 8 ounces come from seafood. This includes salmon, trout, herring, and anchovies. Dairy Aim to eat 3 cup-equivalents of fat-free or low-fat dairy each day. Examples of 1 cup-equivalent of dairy include 1 cup (240 mL) milk, 8 ounces (250 g) yogurt,1 ounces (44 g) natural cheese, or 1 cup (240 mL) fortified soy milk. Fats and oils Aim for about 5 teaspoons (21 g) per day. Choose monounsaturated fats, such as canola and olive oils, avocados, peanut butter, and most nuts, or polyunsaturated fats, such as sunflower, corn, and soybean oils, walnuts, pine nuts, sesame seeds, sunflower seeds, and flaxseed. Beverages Aim for six 8-oz glasses of water per day. Limit coffee to three to five 8-oz cups per day. Limit caffeinated beverages that have added calories, such as soda and energy drinks. Limit alcohol intake to no more than 1 drink a day for nonpregnant women and 2 drinks a day for men. One drink equals 12 oz of beer (355 mL), 5 oz of wine (148 mL), or 1 oz of hard liquor  (44 mL). Seasoning and other foods Avoid adding excess amounts of salt to your foods. Try flavoring foods with herbs and spices instead of salt. Avoid adding sugar to foods. Try using oil-based dressings, sauces, and spreads instead of solid fats. This information is based on general [place] nutrition guidelines. For more information, visit [employer]. Exact amounts may vary based on your nutrition needs. Summary A healthy eating plan may help you to maintain a healthy weight, reduce the risk of chronic diseases, and stay active throughout your life. Plan your meals. Make sure you eat the right portions of a variety of nutrient-rich foods. Try baking, boiling, grilling, or broiling instead of frying. Choose healthy options in all settings, including home, work, school, restaurants, or stores. This information is not intended to replace advice given to you by your health care provider. Make sure you discuss any questions you have with your healthcare provider. Document Revised: 04/15/2017 Document Reviewed: 04/15/2017 Elsevier Patient Education  [place].

## 2020-08-19 NOTE — Progress Notes (Signed)
Patient ID: Kara Hall, female    DOB: Nov 05, 1970  MRN: [id number]  CC: Hypertension   Subjective: Kara Hall is a 50 y.o. female who presents for chronic ds management.  I did a new patient telephone visit with her in March of this year. Her concerns today include:  Patient with history of HTN, thyroid cancer (dx age 26, s/p thyroidectomy), anxiety/dep, GERD, lumbar radiculopathy, HL.   HL: Seen by our PA in May of this year and had blood test done.  Found to have hyperlipidemia with LDL cholesterol of 195.  She was started on Lipitor.   -She would like to discontinue the medication because it is causing cramps in the legs and bad headaches.   Changed her diet some "but I still eat bad."  Eats fried chicken on Sundays.  Drinks mainly water and occasional soda.  She wears a Fitbit.  On average she does about 10,000 steps per day.  Not getting in any exercise outside of work.  She works at a [employer] where she does a lot of walking, lifting and pulling.  HYPERTENSION Currently taking: see medication list.  She is on amlodipine 10 mg daily. Med Adherence: '[x]'$  Yes    '[]'$  No Medication side effects: '[]'$  Yes    '[x]'$  No Adherence with salt restriction: '[x]'$  Yes    '[]'$  No Home Monitoring?: '[]'$  Yes    '[x]'$  No but does have a device Monitoring Frequency:  Home BP results range:  SOB? '[]'$  Yes    '[x]'$  No Chest Pain?: '[]'$  Yes    '[x]'$  No Leg swelling?: '[]'$  Yes    '[]'$  No Headaches?: '[x]'$  Yes which she attributes to atorvastatin   Dizziness? '[x]'$  Yes sometimes Comments:   Complains of being in both heels left greater than right.  She feels she is having a flare of Planter fasciitis on the left side.  Saw her podiatrist Dr. Cannon Kettle on the 21st of last month and was given injections to both heels.   Wears Sketchers with arch support Requesting something for pain.  I see Voltaren on her med list but patient states she is no longer on that.  When I spoke with her in March, she was having  some GI symptoms and had requested referral to the gastroenterologist.  I had placed her on omeprazole for GERD.  Patient states she did not see the gastroenterologist because all of her symptoms resolved once she started the omeprazole which she is still taking.    Hx of thyroid CA.  Followed by Dr. Buddy Duty.  Last seen in March 2022.  Dep/Anx: Initially patient told me she was doing well on Zoloft.  However later in the visit when I informed her that her blood pressure is not under good control, patient became very emotional and started crying.  She states that she is worried about her health.  She feels stressed at work, by her spouse and she worries about 3 children (ages 13, 69 and 9) who are adults and do not live with her.  She also feels stressed in helping to care for her elderly mother.  Her mother lives alone.  She carries food for her.  Her mother makes her feel guilty when she is not able to make it over there.  HM:  Due for Colon CA screen.  Had hysterectomy for fibroid so no longer needs Pap smears.  She declines screening for hepatitis C and HIV.  She does not feel that she is  at increased risk.  Patient Active Problem List   Diagnosis Date Noted   Status post hysterectomy 12/18/2018   Prolonged Q-T interval on ECG 11/24/2018   Essential hypertension, benign 11/24/2018   Dyspareunia in female 03/24/2018   Increased body mass index 03/24/2018   Carpal tunnel syndrome on both sides 12/10/2017   Achilles tendon contracture, left 10/04/2016   Displaced fracture of fifth metatarsal bone of left foot with delayed healing 05/07/2016   Bilateral wrist pain 11/28/2015   History of thyroid cancer 08/09/2013   Anxiety 01/12/2013     Current Outpatient Medications on File Prior to Visit  Medication Sig Dispense Refill   ALPRAZolam (XANAX) 0.5 MG tablet Take 0.5-1 tablets (0.25-0.5 mg total) by mouth daily as needed for anxiety. 15 tablet 0   amLODipine (NORVASC) 10 MG tablet Take 1 tablet  (10 mg total) by mouth daily. 90 tablet 1   levothyroxine (SYNTHROID, LEVOTHROID) 125 MCG tablet Take 125 mcg by mouth daily before breakfast.      omeprazole (PRILOSEC) 20 MG capsule Take 1 capsule (20 mg total) by mouth daily. 30 capsule 3   pregabalin (LYRICA) 75 MG capsule Take 1 capsule (75 mg total) by mouth 2 (two) times daily. TAKE 1 CAPSULE BY MOUTH AT NIGHT FOR 5 TO 7 DAYS THEN TAKE 1 CAPSULE BY MOUTH TWICE DAILY 60 capsule 1   sertraline (ZOLOFT) 100 MG tablet Take 1 tablet (100 mg total) by mouth daily. 30 tablet 3   sertraline (ZOLOFT) 50 MG tablet  (Patient not taking: Reported on 08/19/2020)     Current Facility-Administered Medications on File Prior to Visit  Medication Dose Route Frequency Provider Last Rate Last Admin   triamcinolone acetonide (KENALOG) 10 MG/ML injection 10 mg  10 mg Other Once Landis Martins, DPM        Allergies  Allergen Reactions   Other     BLOOD REFUSAL    Sulfa Antibiotics Other (See Comments) and Nausea And Vomiting    Severe bladder, and kidney infection   Trazodone And Nefazodone Other (See Comments)    Headache every am     Social History   Socioeconomic History   Marital status: Married    Spouse name: [name]   Number of children: 3   Years of education: 12+   Highest education level: Not on file  Occupational History   Occupation: [employer]   Occupation: Cosmetologist    Employer: [employer]  Tobacco Use   Smoking status: Never   Smokeless tobacco: Never  Vaping Use   Vaping Use: Never used  Substance and Sexual Activity   Alcohol use: Yes    Comment: occ   Drug use: No   Sexual activity: Yes    Partners: Male    Birth control/protection: Surgical    Comment: Tubal ligation  Other Topics Concern   Not on file  Social History Narrative   Divorce from previous husband in 06/2016.   Now remarried 07/2016.   Lives at home with her new husband and cat.   She has three grown children who live with her husband. She has a  good relationship with her children.   Her father passed away 06-01-2016.   Social Determinants of Health   Financial Resource Strain: Not on file  Food Insecurity: Not on file  Transportation Needs: Not on file  Physical Activity: Not on file  Stress: Not on file  Social Connections: Not on file  Intimate Partner Violence: Not on file  Family History  Problem Relation Age of Onset   Heart disease Mother    Hyperlipidemia Mother    Heart disease Father    Hyperlipidemia Father    Diabetes Father    Heart disease Brother    Hyperlipidemia Brother    Hypertension Brother    Heart disease Brother    Hyperlipidemia Brother     Past Surgical History:  Procedure Laterality Date   CYSTOSCOPY N/A 12/18/2018   Procedure: CYSTOSCOPY;  Surgeon: Jonelle Sidle, MD;  Location: [facility];  Service: Gynecology;  Laterality: N/A;   LAPAROSCOPIC LYSIS OF ADHESIONS N/A 12/18/2018   Procedure: LAPAROSCOPIC LYSIS OF ADHESIONS;  Surgeon: Jonelle Sidle, MD;  Location: [facility];  Service: Gynecology;  Laterality: N/A;   ROBOTIC ASSISTED LAPAROSCOPIC HYSTERECTOMY AND SALPINGECTOMY Bilateral 12/18/2018   Procedure: XI ROBOTIC ASSISTED LAPAROSCOPIC HYSTERECTOMY AND BILATERAL SALPINGECTOMY;  Surgeon: Jonelle Sidle, MD;  Location: [facility];  Service: Gynecology;  Laterality: Bilateral;   THROMBECTOMY Right 2001   leg; after 3rd child was born; for deep vein thrombosis   THYROIDECTOMY Right 08/28/2013   Procedure: RIGHT THYROID LOBECTOMY POSSIBLE TOTAL THYROIDECTOMY;  Surgeon: Jodi Marble, MD;  Location: [facility];  Service: ENT;  Laterality: Right;   THYROIDECTOMY Left 09/10/2013   Procedure: COMPLETE LEFT THYROIDECTOMY;  Surgeon: Jodi Marble, MD;  Location: [facility];  Service: ENT;  Laterality: Left;   TUBAL LIGATION  2001    ROS: Review of Systems Negative except as stated above  PHYSICAL EXAM: BP (!) 126/99   Pulse 79    Resp 16   Wt 196 lb 6.4 oz (89.1 kg)   LMP 01/08/2019   SpO2 98%   BMI 31.70 kg/m   Wt Readings from Last 3 Encounters:  08/19/20 196 lb 6.4 oz (89.1 kg)  05/19/20 194 lb 12.8 oz (88.4 kg)  01/19/20 190 lb (86.2 kg)    Physical Exam  General appearance - alert, well appearing, middle-age Caucasian female and in no distress Mental status -patient tearful at times when describing stresses in her life. Chest - clear to auscultation, no wheezes, rales or rhonchi, symmetric air entry Heart - normal rate, regular rhythm, normal S1, S2, no murmurs, rubs, clicks or gallops Musculoskeletal -feet: Mild tenderness on palpation of the left plantar heel.  Extremities -no lower extremity edema.  Spider veins in both lower extremities.  Good dorsalis pedis, posterior tibialis and popliteal pulses.    CMP Latest Ref Rng & Units 05/19/2020 12/19/2018 12/09/2018  Glucose 65 - 99 mg/dL 102(H) 122(H) 95  BUN 6 - 24 mg/dL 14 16 22(H)  Creatinine 0.57 - 1.00 mg/dL 0.66 0.73 0.57  Sodium 134 - 144 mmol/L 136 134(L) 136  Potassium 3.5 - 5.2 mmol/L 4.3 3.9 4.2  Chloride 96 - 106 mmol/L 100 100 103  CO2 20 - 29 mmol/L '20 22 24  '$ Calcium 8.7 - 10.2 mg/dL 8.7 7.8(L) 8.5(L)  Total Protein 6.0 - 8.5 g/dL 7.2 - -  Total Bilirubin 0.0 - 1.2 mg/dL 0.4 - -  Alkaline Phos 44 - 121 IU/L 96 - -  AST 0 - 40 IU/L 22 - -  ALT 0 - 32 IU/L 29 - -   Lipid Panel     Component Value Date/Time   CHOL 274 (H) 05/19/2020 0926   TRIG 110 05/19/2020 0926   HDL 60 05/19/2020 0926   CHOLHDL 4.6 (H) 05/19/2020 0926   CHOLHDL 2.7 11/23/2014 1554   VLDL 11 11/23/2014  1554   LDLCALC 195 (H) 05/19/2020 0926    CBC    Component Value Date/Time   WBC 7.3 05/19/2020 0926   WBC 13.9 (H) 12/19/2018 0812   RBC 4.56 05/19/2020 0926   RBC 3.55 (L) 12/19/2018 0812   HGB 14.5 05/19/2020 0926   HCT 42.4 05/19/2020 0926   PLT 288 05/19/2020 0926   MCV 93 05/19/2020 0926   MCH 31.8 05/19/2020 0926   MCH 28.2 12/19/2018 0812    MCHC 34.2 05/19/2020 0926   MCHC 30.6 12/19/2018 0812   RDW 12.2 05/19/2020 0926   LYMPHSABS 1.5 05/19/2020 0926   MONOABS 0.5 11/23/2014 1554   EOSABS 0.2 05/19/2020 0926   BASOSABS 0.1 05/19/2020 0926    ASSESSMENT AND PLAN: 1. Essential hypertension Not at goal.  Discussed cardiovascular risks associated with uncontrolled blood pressure.  Continue amlodipine.  Add Diovan.  HCTZ not added because she is allergic to sulfa.  We have discussed adding lisinopril instead but she was afraid of possible side effect of angioedema that I discussed with her. -Advised to check blood pressure at least twice a week and write down her numbers.  Goal is 130/80 or lower.  Follow-up with clinical pharmacist in 2 weeks for repeat blood pressure check.  At that time please have her stop at the lab to have chemistry done to check kidney function. - valsartan (DIOVAN) 40 MG tablet; Take 1 tablet (40 mg total) by mouth daily.  Dispense: 30 tablet; Refill: 2 - Basic Metabolic Panel; Future  2. Obesity (BMI 30.0-34.9) -Discussed with her that based on her BMI, she is obese for height.  Dietary counseling given.  Advised to eliminate sugary drinks from her diet, cut back on portion sizes of white carbohydrates, eat more lean white meat instead of red meat and incorporate fresh fruits and vegetables into the diet daily.  She declines referral to nutritionist.  Encouraged her to get in some exercise outside of work at least 30 minutes 3 to 5 days a week.  3. Mixed hyperlipidemia Stop atorvastatin since she is having muscle cramps and headaches.  She is agreeable to trying a different statin therapy.  I will place her on Pravachol at a low dose and have her take it only 3 days a week to see if she tolerates it.  Discussed the importance of healthy eating habits including use of unsaturated oils - pravastatin (PRAVACHOL) 10 MG tablet; 1 tab PO Q Mon/Wed/Friday  Dispense: 12 tablet; Refill: 2  4. Stressful life event  affecting family 5. Anxiety and depression Continue Zoloft. -Discussed ways to help decrease stress including exercise, listening to music, talking with friends etc. encouraged her to set aside at least an hour out of each day to do something that brings her joy.  I recommend referral to a therapist but patient declined.  She denies any suicidal ideation at this time.  6. Plantar fasciitis, bilateral Recommend heel exercises to include rubbing the heel over cold can of an open soda to help soothe the tendons in her heel.  I have given a short course of Naprosyn to use as needed. - naproxen (NAPROSYN) 250 MG tablet; Take 1 tablet (250 mg total) by mouth 2 (two) times daily as needed (take with food).  Dispense: 40 tablet; Refill: 0  7. Screening for colon cancer Discussed colon cancer screening methods.  She is at average risk for colon cancer.  Patient wanted to know how much a Cologuard test versus fit test versus colonoscopy  would cost.  I told her to check with her insurance as most insurance covers colon cancer screening as a preventative measure and it may be covered at no cost.  She will speak with her insurance first and then let me know on next visit which screening test she would like to have.    Patient was given the opportunity to ask questions.  Patient verbalized understanding of the plan and was able to repeat key elements of the plan.   Orders Placed This Encounter  Procedures   Basic Metabolic Panel     Requested Prescriptions   Signed Prescriptions Disp Refills   pravastatin (PRAVACHOL) 10 MG tablet 12 tablet 2    Sig: 1 tab PO Q Mon/Wed/Friday   valsartan (DIOVAN) 40 MG tablet 30 tablet 2    Sig: Take 1 tablet (40 mg total) by mouth daily.   naproxen (NAPROSYN) 250 MG tablet 40 tablet 0    Sig: Take 1 tablet (250 mg total) by mouth 2 (two) times daily as needed (take with food).    Return in about 4 months (around 12/19/2020) for Give appt with [facility] in 2 wks for BP  check.  Karle Plumber, MD, FACP

## 2020-08-31 ENCOUNTER — Encounter: Payer: Self-pay | Admitting: Pharmacist

## 2020-08-31 ENCOUNTER — Other Ambulatory Visit: Payer: Self-pay

## 2020-08-31 ENCOUNTER — Ambulatory Visit: Payer: 59 | Attending: Internal Medicine | Admitting: Pharmacist

## 2020-08-31 DIAGNOSIS — I10 Essential (primary) hypertension: Secondary | ICD-10-CM

## 2020-08-31 MED ORDER — AMLODIPINE BESYLATE 5 MG PO TABS
5.0000 mg | ORAL_TABLET | Freq: Every day | ORAL | 1 refills | Status: DC
Start: 2020-08-31 — End: 2021-02-16

## 2020-08-31 NOTE — Progress Notes (Signed)
   S:    PCP: Dr. Wynetta Emery   Patient arrives in good spirits.    Presents to the clinic for hypertension evaluation, counseling, and management. Patient was referred and last seen by Primary Care Provider on 08/19/2020.   Medication adherence reported. She has taken both medications today.    Current BP Medications include:  amlodipine 10 mg daily, valsartan 40 mg daily   Dietary habits include: tries to limit sodium. Will drink coffee in the mornings and sometimes in the afternoon depending on work schedule.  Exercise habits include: very active at work. She measures her step count daily and will easily achieve 10,000 steps a day. No formal exercise regimen outside of work.  Family / Social history:  -Fhx: heart disease, HLD, DM, HTN -Tobacco: never smoker  -Alcohol: none reported  O:  Vitals:   08/31/20 1501  BP: 94/63   Home BP readings: none  Last 3 Office BP readings: BP Readings from Last 3 Encounters:  08/31/20 94/63  08/19/20 (!) 126/99  07/07/20 124/86   BMET    Component Value Date/Time   NA 136 05/19/2020 0926   K 4.3 05/19/2020 0926   CL 100 05/19/2020 0926   CO2 20 05/19/2020 0926   GLUCOSE 102 (H) 05/19/2020 0926   GLUCOSE 122 (H) 12/19/2018 0505   BUN 14 05/19/2020 0926   CREATININE 0.66 05/19/2020 0926   CREATININE 0.48 (L) 11/23/2014 1554   CALCIUM 8.7 05/19/2020 0926   GFRNONAA >60 12/19/2018 0505   GFRAA >60 12/19/2018 0505   Renal function: CrCl cannot be calculated (Patient's most recent lab result is older than the maximum 21 days allowed.).  Clinical ASCVD: No  The 10-year ASCVD risk score Mikey Bussing DC Jr., et al., 2013) is: 1.1%   Values used to calculate the score:     Age: 50 years     Sex: Female     Is Non-Hispanic African American: No     Diabetic: No     Tobacco smoker: No     Systolic Blood Pressure: 94 mmHg     Is BP treated: Yes     HDL Cholesterol: 60 mg/dL     Total Cholesterol: 274 mg/dL  A/P: Hypertension longstanding  currently at goal. In fact her BP is on the low end of normal. I checked it twice, once in the L arm and once in the R arm. Initial BP in the L arm was 99/66 mmHg and 94/63 mmHg with a manual BP cuff on the R arm. She is adherent with current medications. BP Goal = < 130/80 mmHg. She denies any symptoms of low blood pressure but tells me her BP has been lower at home. Unfortunately, she forgot her BP log at home.  -Decreased dose of amlodipine to 5 mg daily. -Continue valsartan 40 mg daily.  -F/u labs ordered - BMP per PCP. -Counseled on lifestyle modifications for blood pressure control including reduced dietary sodium, increased exercise, adequate sleep.  Results reviewed and written information provided.   Total time in face-to-face counseling 15 minutes.   F/U Clinic Visit in 1 month.  Benard Halsted, PharmD, [facility], [place] [phone]

## 2020-09-01 ENCOUNTER — Telehealth: Payer: Self-pay

## 2020-09-01 LAB — BASIC METABOLIC PANEL
BUN/Creatinine Ratio: 26 — ABNORMAL HIGH (ref 9–23)
BUN: 14 mg/dL (ref 6–24)
CO2: 20 mmol/L (ref 20–29)
Calcium: 8.2 mg/dL — ABNORMAL LOW (ref 8.7–10.2)
Chloride: 103 mmol/L (ref 96–106)
Creatinine, Ser: 0.54 mg/dL — ABNORMAL LOW (ref 0.57–1.00)
Glucose: 91 mg/dL (ref 65–99)
Potassium: 3.9 mmol/L (ref 3.5–5.2)
Sodium: 139 mmol/L (ref 134–144)
eGFR: 113 mL/min/{1.73_m2} (ref >59)

## 2020-09-01 NOTE — Telephone Encounter (Signed)
Contacted pt to go over lab results pt didn't answer lvm   Sent a CRM and forward labs to NT to give pt labs when they call back   

## 2020-09-15 ENCOUNTER — Other Ambulatory Visit: Payer: Self-pay | Admitting: Sports Medicine

## 2020-09-15 MED ORDER — TRAMADOL HCL 50 MG PO TABS: 50.0000 mg | ORAL_TABLET | Freq: Three times a day (TID) | ORAL | 0 refills | Status: AC | PRN

## 2020-09-15 NOTE — Progress Notes (Signed)
Refilled tramadol.

## 2020-09-17 ENCOUNTER — Other Ambulatory Visit: Payer: Self-pay | Admitting: Physician Assistant

## 2020-09-17 DIAGNOSIS — F419 Anxiety disorder, unspecified: Secondary | ICD-10-CM

## 2020-09-17 DIAGNOSIS — F4323 Adjustment disorder with mixed anxiety and depressed mood: Secondary | ICD-10-CM

## 2020-09-26 ENCOUNTER — Telehealth: Payer: Self-pay | Admitting: Physical Medicine and Rehabilitation

## 2020-09-26 ENCOUNTER — Telehealth: Payer: Self-pay | Admitting: Orthopedic Surgery

## 2020-09-26 DIAGNOSIS — M5416 Radiculopathy, lumbar region: Secondary | ICD-10-CM

## 2020-09-26 NOTE — Telephone Encounter (Signed)
Left L5 TF on 07/07/20. Ok to repeat if helped, same problem/side, and no new injury?

## 2020-09-26 NOTE — Telephone Encounter (Signed)
Patient would like an appointment with Dr. Ernestina Patches. Her call back number is [phone]

## 2020-09-26 NOTE — Telephone Encounter (Signed)
Patient called. She would like some pain medication called in for her. Says she is in a lot of pain. Her call back number is [phone]

## 2020-09-27 NOTE — Telephone Encounter (Signed)
This pt had injections with [facility] she had an rx for hydrocodone 06/2020 I am not sure if she needs to be referred back to [facility]? She is requesting rx for pain please advise.

## 2020-09-27 NOTE — Telephone Encounter (Signed)
Left message #1 to discuss.

## 2020-09-27 NOTE — Telephone Encounter (Signed)
Pt calling about medication again. Still in a lot of pain

## 2020-09-28 ENCOUNTER — Other Ambulatory Visit: Payer: Self-pay

## 2020-09-28 ENCOUNTER — Ambulatory Visit: Payer: 59 | Attending: Internal Medicine | Admitting: Pharmacist

## 2020-09-28 ENCOUNTER — Other Ambulatory Visit: Payer: Self-pay | Admitting: Physical Medicine and Rehabilitation

## 2020-09-28 VITALS — BP 111/80

## 2020-09-28 DIAGNOSIS — I10 Essential (primary) hypertension: Secondary | ICD-10-CM

## 2020-09-28 MED ORDER — HYDROCODONE-ACETAMINOPHEN 5-325 MG PO TABS: 1.0000 | ORAL_TABLET | ORAL | 0 refills | Status: DC | PRN

## 2020-09-28 NOTE — Telephone Encounter (Signed)
No worries. I can do it today

## 2020-09-28 NOTE — Telephone Encounter (Signed)
Referral placed and patient notified about rx.

## 2020-09-28 NOTE — Progress Notes (Signed)
S:    PCP: Dr. Wynetta Emery  Patient arrives in good spirits. Presents to the clinic for hypertension evaluation, counseling, and management. Patient was referred and last seen by Primary Care Provider on 08/19/2020. Last seen by pharmacy clinic on 08/31/2020.  At her last pharmacy clinic visit, patient's initial BP in the L arm was 99/66 mmHg and 94/63 mmHg with a manual BP cuff on the R arm. She denied any hypotensive signs/symptoms and did not provide a BP log at that visit. Amlodipine 10 mg daily was decreased to 5 mg daily.   Today, patient brought in her BP log with most values >90/60 and <130/80. Patient reports that her BP was been high since decreasing amlodipine. However, she reports checking her BP before taking her morning medications.   Medication adherence reported. She has taken both medications today.    Current BP Medications include:  amlodipine 5 mg daily, valsartan 40 mg daily   Dietary habits include: tries to limit sodium. Will drink coffee in the mornings and sometimes in the afternoon depending on work schedule.  Exercise habits include: very active at work. She measures her step count daily and will easily achieve 10,000 steps a day. No formal exercise regimen outside of work.  Family / Social history:  -Fhx: heart disease, HLD, DM, HTN -Tobacco: never smoker  -Alcohol: none reported  O:  Vitals:   09/28/20 1444  BP: 111/80   Home BP readings: none  Last 3 Office BP readings: BP Readings from Last 3 Encounters:  09/28/20 111/80  08/31/20 94/63  08/19/20 (!) 126/99   BMET    Component Value Date/Time   NA 139 08/31/2020 1502   K 3.9 08/31/2020 1502   CL 103 08/31/2020 1502   CO2 20 08/31/2020 1502   GLUCOSE 91 08/31/2020 1502   GLUCOSE 122 (H) 12/19/2018 0505   BUN 14 08/31/2020 1502   CREATININE 0.54 (L) 08/31/2020 1502   CREATININE 0.48 (L) 11/23/2014 1554   CALCIUM 8.2 (L) 08/31/2020 1502   GFRNONAA >60 12/19/2018 0505   GFRAA >60 12/19/2018 0505    Renal function: CrCl cannot be calculated (Patient's most recent lab result is older than the maximum 21 days allowed.).  Clinical ASCVD: No  The 10-year ASCVD risk score (Arnett DK, et al., 2019) is: 1.5%   Values used to calculate the score:     Age: 38 years     Sex: Female     Is Non-Hispanic African American: No     Diabetic: No     Tobacco smoker: No     Systolic Blood Pressure: 99991111 mmHg     Is BP treated: Yes     HDL Cholesterol: 60 mg/dL     Total Cholesterol: 274 mg/dL  A/P: Hypertension longstanding currently at goal. BP Goal = < 130/80 mmHg. Patient also states she is having some issues with her BP cuff at home. She states that sometimes it over inflates and causes her discomfort. Since patient's home BP is close to goal even before taking her BP medications, no changes to medications today.  -Educated patient on how to accurately check her BP -Offered patient to bring BP cuff to next visit so we can trouble shoot the inflation issue and compare her home monitor the clinic monitor.  -Counseled on lifestyle modifications for blood pressure control including reduced dietary sodium, increased exercise, adequate sleep.  Results reviewed and written information provided.   Total time in face-to-face counseling 20 minutes.   F/U  Pharmacy Clinic Visit in 1-1.5 months.  Pauletta Browns, Pharm.D. PGY-1 Pharmacy Resident [phone] 09/29/2020 4:37 PM

## 2020-09-29 ENCOUNTER — Encounter: Payer: Self-pay | Admitting: Pharmacist

## 2020-10-05 ENCOUNTER — Telehealth: Payer: Self-pay | Admitting: Physical Medicine and Rehabilitation

## 2020-10-05 NOTE — Telephone Encounter (Signed)
Pt received authorization letter from her ins company to have the inj done between 09/29/20 and 11/14/20 with Dr. Ernestina Patches and was wanting to get sch whenever possible. The best call back number [phone].

## 2020-10-05 NOTE — Telephone Encounter (Signed)
Pt returned phone call to schedule with Dr. Ernestina Patches

## 2020-10-05 NOTE — Telephone Encounter (Signed)
Scheduled for 10/5

## 2020-10-19 ENCOUNTER — Encounter: Payer: Self-pay | Admitting: Physical Medicine and Rehabilitation

## 2020-10-19 ENCOUNTER — Ambulatory Visit: Payer: Self-pay

## 2020-10-19 ENCOUNTER — Ambulatory Visit (INDEPENDENT_AMBULATORY_CARE_PROVIDER_SITE_OTHER): Payer: 59 | Admitting: Physical Medicine and Rehabilitation

## 2020-10-19 ENCOUNTER — Other Ambulatory Visit: Payer: Self-pay

## 2020-10-19 VITALS — BP 116/76 | HR 85

## 2020-10-19 DIAGNOSIS — M5416 Radiculopathy, lumbar region: Secondary | ICD-10-CM

## 2020-10-19 MED ORDER — METHYLPREDNISOLONE ACETATE 80 MG/ML IJ SUSP
80.0000 mg | Freq: Once | INTRAMUSCULAR | Status: AC
Start: 2020-10-19 — End: 2020-10-19
  Administered 2020-10-19: 80 mg

## 2020-10-19 NOTE — Progress Notes (Signed)
1 time 30 tab norco due to ct, no refill need to see PCP afterPt state lower back pain that travels down her left leg to the foot. Pt state her left calf muscle his tingling and tightness. Pt state walking, standing and sitting makes the pain worse. Pt state she takes pain meds, heating and ice to help ease her pain. Pt has hx of inj on 07/07/20 pt state it helped for a month.  Numeric Pain Rating Scale and Functional Assessment Average Pain 6   In the last MONTH (on 0-10 scale) has pain interfered with the following?  1. General activity like being  able to carry out your everyday physical activities such as walking, climbing stairs, carrying groceries, or moving a chair?  Rating(10)   +Driver, -BT, -Dye Allergies.

## 2020-10-19 NOTE — Patient Instructions (Signed)

## 2020-10-20 MED ORDER — HYDROCODONE-ACETAMINOPHEN 5-325 MG PO TABS: 1.0000 | ORAL_TABLET | Freq: Three times a day (TID) | ORAL | 0 refills | Status: DC | PRN

## 2020-10-20 MED ORDER — PREGABALIN 75 MG PO CAPS: 75.0000 mg | ORAL_CAPSULE | Freq: Two times a day (BID) | ORAL | 1 refills | Status: DC

## 2020-10-24 NOTE — Progress Notes (Signed)
IZABELLE DAUS - 50 y.o. female MRN [id number]  Date of birth: May 24, 1970  Office Visit Note: Visit Date: 10/19/2020 PCP: Ladell Pier, MD Referred by: Ladell Pier, MD  Subjective: Chief Complaint  Patient presents with   Lower Back - Pain   Left Leg - Pain   Left Foot - Pain   HPI:  GLAYDS INSCO is a 50 y.o. female who comes in today for planned repeat Left L5-S1  Lumbar Transforaminal epidural steroid injection with fluoroscopic guidance.  The patient has failed conservative care including home exercise, medications, time and activity modification.  This injection will be diagnostic and hopefully therapeutic.  Please see requesting physician notes for further details and justification. Patient received more than 50% pain relief from prior injection.   She did ask for refill on Lyrica and hydrocodone. She reports cost issue with obtaining less than 30 hydrocodone. I did complete Rx for 30 tabs for one time only. Patient's 12 month [place] history was reviewed and no inappropriate medication refills noted. We also discussed in detail long term pain management with opioids. We do not feel that management of the patients chronic pain with long term use of opioids is efficacious or a safe option due to the highly addictive nature of these medications. We also feel that long term use of opioids can negatively impact other co-morbid conditions .  We are happy to offer patient other adjunctive therapies such as non-narcotic medications and physical therapy.      Referring: Dr. Meridee Score   ROS Otherwise per HPI.  Assessment & Plan: Visit Diagnoses:    ICD-10-CM   1. Lumbar radiculopathy  M54.16 XR C-ARM NO REPORT    Epidural Steroid injection    methylPREDNISolone acetate (DEPO-MEDROL) injection 80 mg      Plan: No additional findings.   Meds & Orders:  Meds ordered this encounter  Medications   methylPREDNISolone acetate (DEPO-MEDROL)  injection 80 mg   pregabalin (LYRICA) 75 MG capsule    Sig: Take 1 capsule (75 mg total) by mouth 2 (two) times daily.    Dispense:  60 capsule    Refill:  1   HYDROcodone-acetaminophen (NORCO/VICODIN) 5-325 MG tablet    Sig: Take 1 tablet by mouth every 8 (eight) hours as needed for moderate pain or severe pain.    Dispense:  30 tablet    Refill:  0    Orders Placed This Encounter  Procedures   XR C-ARM NO REPORT   Epidural Steroid injection    Follow-up: Return if symptoms worsen or fail to improve.   Procedures: No procedures performed  Lumbosacral Transforaminal Epidural Steroid Injection - Sub-Pedicular Approach with Fluoroscopic Guidance  Patient: NAVIA LINDAHL      Date of Birth: [birth year] MRN: [id number] PCP: Ladell Pier, MD      Visit Date: 10/19/2020   Universal Protocol:    Date/Time: 10/19/2020  Consent Given By: the patient  Position: PRONE  Additional Comments: Vital signs were monitored before and after the procedure. Patient was prepped and draped in the usual sterile fashion. The correct patient, procedure, and site was verified.   Injection Procedure Details:   Procedure diagnoses: Lumbar radiculopathy [M54.16]    Meds Administered:  Meds ordered this encounter  Medications   methylPREDNISolone acetate (DEPO-MEDROL) injection 80 mg   pregabalin (LYRICA) 75 MG capsule    Sig: Take 1 capsule (75 mg total) by mouth 2 (two) times daily.  Dispense:  60 capsule    Refill:  1   HYDROcodone-acetaminophen (NORCO/VICODIN) 5-325 MG tablet    Sig: Take 1 tablet by mouth every 8 (eight) hours as needed for moderate pain or severe pain.    Dispense:  30 tablet    Refill:  0    Laterality: Left  Location/Site: L5  Needle:5.0 in., 22 ga.  Short bevel or Quincke spinal needle  Needle Placement: Transforaminal  Findings:    -Comments: Excellent flow of contrast along the nerve, nerve root and into the epidural space.  Procedure  Details: After squaring off the end-plates to get a true AP view, the C-arm was positioned so that an oblique view of the foramen as noted above was visualized. The target area is just inferior to the "nose of the scotty dog" or sub pedicular. The soft tissues overlying this structure were infiltrated with 2-3 ml. of 1% Lidocaine without Epinephrine.  The spinal needle was inserted toward the target using a "trajectory" view along the fluoroscope beam.  Under AP and lateral visualization, the needle was advanced so it did not puncture dura and was located close the 6 O'Clock position of the pedical in AP tracterory. Biplanar projections were used to confirm position. Aspiration was confirmed to be negative for CSF and/or blood. A 1-2 ml. volume of Isovue-250 was injected and flow of contrast was noted at each level. Radiographs were obtained for documentation purposes.   After attaining the desired flow of contrast documented above, a 0.5 to 1.0 ml test dose of 0.25% Marcaine was injected into each respective transforaminal space.  The patient was observed for 90 seconds post injection.  After no sensory deficits were reported, and normal lower extremity motor function was noted,   the above injectate was administered so that equal amounts of the injectate were placed at each foramen (level) into the transforaminal epidural space.   Additional Comments:  The patient tolerated the procedure well Dressing: 2 x 2 sterile gauze and Band-Aid    Post-procedure details: Patient was observed during the procedure. Post-procedure instructions were reviewed.  Patient left the clinic in stable condition.     Clinical History: MRI lumbar spine:   INDICATION: Low back pain which radiates down the left leg.   TECHNIQUE: Sagittal and axial T1 and T2-weighted sequences were performed. Additional sagittal STIR images were performed.   COMPARISON: None available   FINDINGS:  #  Osseous structures: No  compression fracture. There is marrow edema in the left pedicle of L5 suggesting a stress reaction.  #  Alignment: Normal.  #  Modic changes: Mild type I Modic change in the endplates adjacent to the degenerated L4-5 disc.  #  Conus medullaris: Normal. Terminates at L1 with no evidence of tethering.  #  Lower thoracic segments: No significant abnormality.  #  Additional findings: Bilateral ovarian cystic lesions. The one on the left measures at least 3.5 cm.   #  L1-2: Unremarkable  #    #  L2-3: Unremarkable  #    #  L3-4: Mild facet joint arthritis. No spinal or foraminal stenosis.  #    #  L4-5: Central disc protrusion with impingement on the thecal sac. Moderate facet joint arthritis.  #    #  L5-S1: Mild degenerative disc disease. Mild facet joint arthritis on the right and moderate facet joint arthritis on the left.    IMPRESSION:  1.  Central disc protrusion L4-5 with impingement on the thecal sac.  2.  Facet joint arthritis lower lumbar spine.  3.  Marrow edema in the left pedicle of L5 suggesting a stress reaction.   Electronically Signed by: Ross Marcus on 06/29/2020 1:05 PM     Objective:  VS:  HT:    WT:   BMI:     BP:116/76  HR:85bpm  TEMP: ( )  RESP:  Physical Exam Vitals and nursing note reviewed.  Constitutional:      General: She is not in acute distress.    Appearance: Normal appearance. She is not ill-appearing.  HENT:     Head: Normocephalic and atraumatic.     Right Ear: External ear normal.     Left Ear: External ear normal.  Eyes:     Extraocular Movements: Extraocular movements intact.  Cardiovascular:     Rate and Rhythm: Normal rate.     Pulses: Normal pulses.  Pulmonary:     Effort: Pulmonary effort is normal. No respiratory distress.  Abdominal:     General: There is no distension.     Palpations: Abdomen is soft.  Musculoskeletal:        General: Tenderness present.     Cervical back: Neck supple.     Right lower leg: No edema.      Left lower leg: No edema.     Comments: Patient has good distal strength with no pain over the greater trochanters.  No clonus or focal weakness.  Skin:    Findings: No erythema, lesion or rash.  Neurological:     General: No focal deficit present.     Mental Status: She is alert and oriented to person, place, and time.     Sensory: No sensory deficit.     Motor: No weakness or abnormal muscle tone.     Coordination: Coordination normal.  Psychiatric:        Mood and Affect: Mood normal.        Behavior: Behavior normal.     Imaging: No results found.

## 2020-10-24 NOTE — Procedures (Signed)
Lumbosacral Transforaminal Epidural Steroid Injection - Sub-Pedicular Approach with Fluoroscopic Guidance  Patient: Kara Hall      Date of Birth: 03-14-1970 MRN: [id number] PCP: Ladell Pier, MD      Visit Date: 10/19/2020   Universal Protocol:    Date/Time: 10/19/2020  Consent Given By: the patient  Position: PRONE  Additional Comments: Vital signs were monitored before and after the procedure. Patient was prepped and draped in the usual sterile fashion. The correct patient, procedure, and site was verified.   Injection Procedure Details:   Procedure diagnoses: Lumbar radiculopathy [M54.16]    Meds Administered:  Meds ordered this encounter  Medications   methylPREDNISolone acetate (DEPO-MEDROL) injection 80 mg   pregabalin (LYRICA) 75 MG capsule    Sig: Take 1 capsule (75 mg total) by mouth 2 (two) times daily.    Dispense:  60 capsule    Refill:  1   HYDROcodone-acetaminophen (NORCO/VICODIN) 5-325 MG tablet    Sig: Take 1 tablet by mouth every 8 (eight) hours as needed for moderate pain or severe pain.    Dispense:  30 tablet    Refill:  0    Laterality: Left  Location/Site: L5  Needle:5.0 in., 22 ga.  Short bevel or Quincke spinal needle  Needle Placement: Transforaminal  Findings:    -Comments: Excellent flow of contrast along the nerve, nerve root and into the epidural space.  Procedure Details: After squaring off the end-plates to get a true AP view, the C-arm was positioned so that an oblique view of the foramen as noted above was visualized. The target area is just inferior to the "nose of the scotty dog" or sub pedicular. The soft tissues overlying this structure were infiltrated with 2-3 ml. of 1% Lidocaine without Epinephrine.  The spinal needle was inserted toward the target using a "trajectory" view along the fluoroscope beam.  Under AP and lateral visualization, the needle was advanced so it did not puncture dura and was located  close the 6 O'Clock position of the pedical in AP tracterory. Biplanar projections were used to confirm position. Aspiration was confirmed to be negative for CSF and/or blood. A 1-2 ml. volume of Isovue-250 was injected and flow of contrast was noted at each level. Radiographs were obtained for documentation purposes.   After attaining the desired flow of contrast documented above, a 0.5 to 1.0 ml test dose of 0.25% Marcaine was injected into each respective transforaminal space.  The patient was observed for 90 seconds post injection.  After no sensory deficits were reported, and normal lower extremity motor function was noted,   the above injectate was administered so that equal amounts of the injectate were placed at each foramen (level) into the transforaminal epidural space.   Additional Comments:  The patient tolerated the procedure well Dressing: 2 x 2 sterile gauze and Band-Aid    Post-procedure details: Patient was observed during the procedure. Post-procedure instructions were reviewed.  Patient left the clinic in stable condition.

## 2020-11-02 ENCOUNTER — Other Ambulatory Visit: Payer: Self-pay | Admitting: Internal Medicine

## 2020-11-02 DIAGNOSIS — E782 Mixed hyperlipidemia: Secondary | ICD-10-CM

## 2020-11-02 NOTE — Telephone Encounter (Signed)
Requested Prescriptions  Pending Prescriptions Disp Refills  . pravastatin (PRAVACHOL) 10 MG tablet [Pharmacy Med Name: PRAVASTATIN 10MG  TABLETS] 12 tablet 2    Sig: TAKE 1 TABLET BY MOUTH EVERY MONDAY, WEDNESDAY, [place]     Cardiovascular:  Antilipid - Statins Failed - 11/02/2020  3:40 AM      Failed - Total Cholesterol in normal range and within 360 days    Cholesterol, Total  Date Value Ref Range Status  05/19/2020 274 (H) 100 - 199 mg/dL Final         Failed - LDL in normal range and within 360 days    LDL Chol Calc (NIH)  Date Value Ref Range Status  05/19/2020 195 (H) 0 - 99 mg/dL Final         Passed - HDL in normal range and within 360 days    HDL  Date Value Ref Range Status  05/19/2020 60 >39 mg/dL Final         Passed - Triglycerides in normal range and within 360 days    Triglycerides  Date Value Ref Range Status  05/19/2020 110 0 - 149 mg/dL Final         Passed - Patient is not pregnant      Passed - Valid encounter within last 12 months    Recent Outpatient Visits          1 month ago Essential hypertension   [place], [name], RPH-CPP   2 months ago Essential hypertension   [place], [name], RPH-CPP   2 months ago Essential hypertension   [place], MD   5 months ago Essential hypertension, benign   [place], [place], [name]   6 months ago Abdominal bloating   [place], MD      Future Appointments            In 1 month Wynetta Emery, Dalbert Batman, MD [place]

## 2020-11-11 ENCOUNTER — Other Ambulatory Visit: Payer: Self-pay | Admitting: Internal Medicine

## 2020-11-11 ENCOUNTER — Ambulatory Visit: Payer: Self-pay | Admitting: *Deleted

## 2020-11-11 DIAGNOSIS — I10 Essential (primary) hypertension: Secondary | ICD-10-CM

## 2020-11-11 NOTE — Telephone Encounter (Signed)
Reason for Disposition  MODERATE hand swelling (e.g., visible swelling of hand and fingers; pitting edema)  Answer Assessment - Initial Assessment Questions 1. ONSET: "When did the swelling start?" (e.g., minutes, hours, days)     2 days 2. LOCATION: "What part of the hand is swollen?"  "Are both hands swollen or just one hand?"     Thumb and below thumb 3. SEVERITY: "How bad is the swelling?" (e.g., localized; mild, moderate, severe)   - BALL OR LUMP: small ball or lump   - LOCALIZED: puffy or swollen area or patch of skin   - JOINT SWELLING: swelling of a joint   - MILD: puffiness or mild swelling of fingers or hand   - MODERATE: fingers and hand are swollen   - SEVERE: swelling of entire hand and up into forearm     localized 4. REDNESS: "Does the swelling look red or infected?"     Light red 5. PAIN: "Is the swelling painful to touch?" If Yes, ask: "How painful is it?"   (Scale 1-10; mild, moderate or severe)     7 6. FEVER: "Do you have a fever?" If Yes, ask: "What is it, how was it measured, and when did it start?"      no 7. CAUSE: "What do you think is causing the hand swelling?" (e.g., heat, insect bite, pregnancy, recent injury)     Over work- repetitive overwork of joint 8. MEDICAL HISTORY: "Do you have a history of heart failure, kidney disease, liver failure, or cancer?"     Thyroid cancer 9. RECURRENT SYMPTOM: "Have you had hand swelling before?" If Yes, ask: "When was the last time?" "What happened that time?"     History- carpel tunnel- braces, pain medication  10. OTHER SYMPTOMS: "Do you have any other symptoms?" (e.g., blurred vision, difficulty breathing, headache)       no 11. PREGNANCY: "Is there any chance you are pregnant?" "When was your last menstrual period?"       na  Protocols used: Hand Swelling-A-AH

## 2020-11-11 NOTE — Telephone Encounter (Signed)
Patient is calling to report she has hand swelling that is at thumb and below with redness. 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 Patient advised no appointment available in office today- advised ortho UC/UC for evaluation.

## 2020-11-13 ENCOUNTER — Other Ambulatory Visit: Payer: Self-pay | Admitting: Physician Assistant

## 2020-11-13 DIAGNOSIS — K219 Gastro-esophageal reflux disease without esophagitis: Secondary | ICD-10-CM

## 2020-11-13 DIAGNOSIS — F419 Anxiety disorder, unspecified: Secondary | ICD-10-CM

## 2020-11-13 DIAGNOSIS — F4323 Adjustment disorder with mixed anxiety and depressed mood: Secondary | ICD-10-CM

## 2020-11-13 NOTE — Telephone Encounter (Signed)
Requested Prescriptions  Pending Prescriptions Disp Refills  . sertraline (ZOLOFT) 100 MG tablet [Pharmacy Med Name: SERTRALINE 100MG  TABLETS] 30 tablet 1    Sig: TAKE 1 TABLET(100 MG) BY MOUTH DAILY     Psychiatry:  Antidepressants - SSRI Passed - 11/13/2020  6:20 AM      Passed - Valid encounter within last 6 months    Recent Outpatient Visits          1 month ago Essential hypertension   [place], [name], RPH-CPP   2 months ago Essential hypertension   [place], [name], RPH-CPP   2 months ago Essential hypertension   [place], MD   5 months ago Essential hypertension, benign   [place], [place], [name]   7 months ago Abdominal bloating   [place], MD      Future Appointments            Tomorrow [place], Janene Harvey [place]   In 1 month Wynetta Emery, Dalbert Batman, MD [place]           . omeprazole (PRILOSEC) 20 MG capsule [Pharmacy Med Name: OMEPRAZOLE 20MG  CAPSULES] 90 capsule 0    Sig: TAKE 1 CAPSULE(20 MG) BY MOUTH DAILY     Gastroenterology: Proton Pump Inhibitors Passed - 11/13/2020  6:20 AM      Passed - Valid encounter within last 12 months    Recent Outpatient Visits          1 month ago Essential hypertension   [place], [name], RPH-CPP   2 months ago Essential hypertension   [place], [name], RPH-CPP   2 months ago Essential hypertension   [place], MD   5 months ago Essential hypertension, benign   [place], [place], [name]   7 months ago Abdominal bloating   [place], MD      Future Appointments            Tomorrow [place], Janene Harvey [place]   In 1 month Wynetta Emery, Dalbert Batman, MD [place]

## 2020-11-14 ENCOUNTER — Ambulatory Visit: Payer: 59 | Admitting: Physician Assistant

## 2020-11-24 ENCOUNTER — Other Ambulatory Visit: Payer: Self-pay | Admitting: Internal Medicine

## 2020-11-24 DIAGNOSIS — Z1231 Encounter for screening mammogram for malignant neoplasm of breast: Secondary | ICD-10-CM

## 2020-12-15 ENCOUNTER — Telehealth: Payer: Self-pay | Admitting: Physical Medicine and Rehabilitation

## 2020-12-15 NOTE — Telephone Encounter (Signed)
Pt called and would like to get an injection asap. She feeling lots of aching and tingling. Pt last got Left L5 TF on 10/05. She states it was about 70% relief.   CB [phone]

## 2020-12-15 NOTE — Telephone Encounter (Signed)
Tried calling-no answer. LM to [facility]

## 2020-12-16 ENCOUNTER — Telehealth: Payer: Self-pay | Admitting: Physical Medicine and Rehabilitation

## 2020-12-16 ENCOUNTER — Ambulatory Visit: Payer: Self-pay

## 2020-12-16 ENCOUNTER — Ambulatory Visit: Payer: 59 | Admitting: Internal Medicine

## 2020-12-16 NOTE — Telephone Encounter (Signed)
Patient called. She would like to RSC her appointment with Dr. Newton 

## 2020-12-16 NOTE — Telephone Encounter (Signed)
Pt called returning a call for an appt; pt states she has work @ 1:45 and would like a CB before then please.   [phone]

## 2020-12-16 NOTE — Telephone Encounter (Signed)
Pt. States she has sciatica. Waiting for her orthopedic doctor to call her back for an appointment  for "an injection in my back." Pain is in low back and goes down left leg. Wants PCP to be aware.     Answer Assessment - Initial Assessment Questions 1. ONSET: "When did the pain begin?"      1 month ago 2. LOCATION: "Where does it hurt?" (upper, mid or lower back)     Low back, down left leg 3. SEVERITY: "How bad is the pain?"  (e.g., Scale 1-10; mild, moderate, or severe)   - MILD (1-3): doesn't interfere with normal activities    - MODERATE (4-7): interferes with normal activities or awakens from sleep    - SEVERE (8-10): excruciating pain, unable to do any normal activities      Severe 4. PATTERN: "Is the pain constant?" (e.g., yes, no; constant, intermittent)      Constant today 5. RADIATION: "Does the pain shoot into your legs or elsewhere?"     Left leg 6. CAUSE:  "What do you think is causing the back pain?"      Sciatica 7. BACK OVERUSE:  "Any recent lifting of heavy objects, strenuous work or exercise?"     Yes 8. MEDICATIONS: "What have you taken so far for the pain?" (e.g., nothing, acetaminophen, NSAIDS)     Yes 9. NEUROLOGIC SYMPTOMS: "Do you have any weakness, numbness, or problems with bowel/bladder control?"     No 10. OTHER SYMPTOMS: "Do you have any other symptoms?" (e.g., fever, abdominal pain, burning with urination, blood in urine)       No 11. PREGNANCY: "Is there any chance you are pregnant?" (e.g., yes, no; LMP)       No  Protocols used: Back Pain-A-AH

## 2020-12-16 NOTE — Telephone Encounter (Signed)
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 She is wondering if she could get something for her pain in the meantime until she can get in 12/21.  CB [phone]

## 2020-12-16 NOTE — Telephone Encounter (Signed)
Message noted.

## 2020-12-16 NOTE — Telephone Encounter (Signed)
I have messaged Kara Hall asking for her to follow up on this. See documentation on prior message.

## 2020-12-16 NOTE — Telephone Encounter (Signed)
Scheduled

## 2020-12-19 ENCOUNTER — Telehealth: Payer: Self-pay | Admitting: Physical Medicine and Rehabilitation

## 2020-12-19 ENCOUNTER — Other Ambulatory Visit: Payer: Self-pay | Admitting: Orthopedic Surgery

## 2020-12-19 MED ORDER — HYDROCODONE-ACETAMINOPHEN 5-325 MG PO TABS: 1.0000 | ORAL_TABLET | Freq: Three times a day (TID) | ORAL | 0 refills | Status: DC | PRN

## 2020-12-19 NOTE — Telephone Encounter (Signed)
Pt called asking for pain medication up until her injection appt. 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 Please send a refill of hydrocodone or some type of pain medication. Please send to pharmacy on file. Please call when meds called in [phone].

## 2020-12-19 NOTE — Telephone Encounter (Signed)
Pt scheduled for injection with Fn 01/04/21 please see message below. Pt is requesting rx for pain medication until that time.

## 2020-12-19 NOTE — Telephone Encounter (Signed)
Called pt and lm onvm to advise this has been done.  To call with questions.

## 2020-12-19 NOTE — Addendum Note (Signed)
Addended by: Raymondo Band on: 12/19/2020 08:31 AM   Modules accepted: Orders

## 2020-12-20 ENCOUNTER — Encounter: Payer: Self-pay | Admitting: Nurse Practitioner

## 2020-12-20 ENCOUNTER — Ambulatory Visit: Payer: 59 | Attending: Nurse Practitioner | Admitting: Nurse Practitioner

## 2020-12-20 ENCOUNTER — Telehealth: Payer: 59 | Admitting: Nurse Practitioner

## 2020-12-20 ENCOUNTER — Other Ambulatory Visit: Payer: Self-pay

## 2020-12-20 DIAGNOSIS — J020 Streptococcal pharyngitis: Secondary | ICD-10-CM | POA: Diagnosis not present

## 2020-12-20 MED ORDER — PSEUDOEPH-BROMPHEN-DM 30-2-10 MG/5ML PO SYRP
5.0000 mL | ORAL_SOLUTION | Freq: Four times a day (QID) | ORAL | 0 refills | Status: DC | PRN
Start: 2020-12-20 — End: 2021-01-03

## 2020-12-20 MED ORDER — AMOXICILLIN 500 MG PO CAPS: 500.0000 mg | ORAL_CAPSULE | Freq: Two times a day (BID) | ORAL | 0 refills | Status: AC

## 2020-12-20 NOTE — Progress Notes (Signed)
NO ANSWER LVM

## 2020-12-20 NOTE — Progress Notes (Signed)
Virtual Visit via Telephone Note Due to national recommendations of social distancing due to [place] 19, telehealth visit is felt to be most appropriate for this patient at this time.  I discussed the limitations, risks, security and privacy concerns of performing an evaluation and management service by telephone and the availability of in person appointments. I also discussed with the patient that there may be a patient responsible charge related to this service. The patient expressed understanding and agreed to proceed.    I connected with Kara Hall on 12/20/20  at   8:30 AM EST  EDT by telephone and verified that I am speaking with the correct person using two identifiers.  Location of Patient: Private Residence   Location of Provider: [facility] participating in Telemedicine visit: [name] FNP-BC RONNETT PULLIN    History of Present Illness: Telemedicine visit for: SORE THROAT  Sore Throat: Patient complains of sore throat. Associated symptoms include dry cough, bilateral ear fullness, pain, and pressure, enlarged tonsils, headache, nasal blockage, post nasal drip, sinus and nasal congestion, sore throat, and swollen glands.Onset of symptoms was 5 days ago, gradually worsening since that time.  OTC medications currently tried: Tylenol and "allergy pill".   She endorses having a past medical history of reoccurring strep throat and current symptoms are similar to previous diagnosis.    Past Medical History:  Diagnosis Date   Anemia    Anxiety    panic attack, also reports problems with depression, relative to her job & upcoming surgery , relative to life problems - with children & spouse     Back complaints 2013   has had steroid injection    Depression    DVT (deep venous thrombosis) ([place]) 2001   RLE   Fibroids    Foot fracture, left    HTN (hypertension)    Migraine    h/o migraines - as a teenager  & again now  with family issues, states she has to lay down & rest in a dark room  (08/28/2013)   Muscle strain of hand    wearing home made splint today, L hand   Peripheral vascular disease ([place])    DVT- per pt. in 2001- had vein removed.    Refusal of blood transfusions as patient is Jehovah's Witness    Thyroid cancer ([place])    Thyroid nodule     Past Surgical History:  Procedure Laterality Date   CYSTOSCOPY N/A 12/18/2018   Procedure: CYSTOSCOPY;  Surgeon: Jonelle Sidle, MD;  Location: [facility];  Service: Gynecology;  Laterality: N/A;   LAPAROSCOPIC LYSIS OF ADHESIONS N/A 12/18/2018   Procedure: LAPAROSCOPIC LYSIS OF ADHESIONS;  Surgeon: Jonelle Sidle, MD;  Location: [facility];  Service: Gynecology;  Laterality: N/A;   ROBOTIC ASSISTED LAPAROSCOPIC HYSTERECTOMY AND SALPINGECTOMY Bilateral 12/18/2018   Procedure: XI ROBOTIC ASSISTED LAPAROSCOPIC HYSTERECTOMY AND BILATERAL SALPINGECTOMY;  Surgeon: Jonelle Sidle, MD;  Location: [facility];  Service: Gynecology;  Laterality: Bilateral;   THROMBECTOMY Right 2001   leg; after 3rd child was born; for deep vein thrombosis   THYROIDECTOMY Right 08/28/2013   Procedure: RIGHT THYROID LOBECTOMY POSSIBLE TOTAL THYROIDECTOMY;  Surgeon: Jodi Marble, MD;  Location: [facility];  Service: ENT;  Laterality: Right;   THYROIDECTOMY Left 09/10/2013   Procedure: COMPLETE LEFT THYROIDECTOMY;  Surgeon: Jodi Marble, MD;  Location: [facility];  Service: ENT;  Laterality: Left;   TUBAL LIGATION  2001    Family History  Problem Relation Age of Onset   Heart disease Mother    Hyperlipidemia Mother    Heart disease Father    Hyperlipidemia Father    Diabetes Father    Heart disease Brother    Hyperlipidemia Brother    Hypertension Brother    Heart disease Brother    Hyperlipidemia Brother     Social History   Socioeconomic History   Marital status: Married    Spouse name: [name]   Number of  children: 3   Years of education: 12+   Highest education level: Not on file  Occupational History   Occupation: [employer]   Occupation: Cosmetologist    Employer: [employer]  Tobacco Use   Smoking status: Never   Smokeless tobacco: Never  Vaping Use   Vaping Use: Never used  Substance and Sexual Activity   Alcohol use: Yes    Comment: occ   Drug use: No   Sexual activity: Yes    Partners: Male    Birth control/protection: Surgical    Comment: Tubal ligation  Other Topics Concern   Not on file  Social History Narrative   Divorce from previous husband in 06/2016.   Now remarried 07/2016.   Lives at home with her new husband and cat.   She has three grown children who live with her husband. She has a good relationship with her children.   Her father passed away 10-May-2016.   Social Determinants of Health   Financial Resource Strain: Not on file  Food Insecurity: Not on file  Transportation Needs: Not on file  Physical Activity: Not on file  Stress: Not on file  Social Connections: Not on file     Observations/Objective: Awake, alert and oriented x 3   Review of Systems  Constitutional:  Negative for fever, malaise/fatigue and weight loss.  HENT:  Positive for congestion, ear pain, sinus pain and sore throat. Negative for nosebleeds.   Eyes: Negative.  Negative for blurred vision, double vision and photophobia.  Respiratory:  Positive for cough. Negative for shortness of breath and wheezing.   Cardiovascular: Negative.  Negative for chest pain, palpitations and leg swelling.  Gastrointestinal: Negative.  Negative for heartburn, nausea and vomiting.  Musculoskeletal: Negative.  Negative for myalgias.  Neurological:  Positive for headaches. Negative for dizziness, focal weakness and seizures.  Psychiatric/Behavioral: Negative.  Negative for suicidal ideas.    Assessment and Plan: Diagnoses and all orders for this visit:  Strep throat -      brompheniramine-pseudoephedrine-DM 30-2-10 MG/5ML syrup; Take 5 mLs by mouth 4 (four) times daily as needed. -     amoxicillin (AMOXIL) 500 MG capsule; Take 1 capsule (500 mg total) by mouth 2 (two) times daily for 10 days.    Follow Up Instructions Return if symptoms worsen or fail to improve.     I discussed the assessment and treatment plan with the patient. The patient was provided an opportunity to ask questions and all were answered. The patient agreed with the plan and demonstrated an understanding of the instructions.   The patient was advised to call back or seek an in-person evaluation if the symptoms worsen or if the condition fails to improve as anticipated.  I provided 10 minutes of non-face-to-face time during this encounter including median intraservice time, reviewing previous notes, labs, imaging, medications and explaining diagnosis and management.  Gildardo Pounds, FNP-BC

## 2020-12-21 ENCOUNTER — Telehealth: Payer: Self-pay | Admitting: Internal Medicine

## 2020-12-21 NOTE — Telephone Encounter (Signed)
Copied from [facility] [id number]. Topic: General - Other >> Dec 20, 2020 11:47 AM Pawlus, Brayton Layman A wrote: Reason for CRM: Pt called in to follow up about a work note discussed during her telephone visit today 12/6, pt asked if it could be printed and she could come pick it up from the office since she does not really use her MyChart, please advise.

## 2020-12-21 NOTE — Telephone Encounter (Signed)
Pt had an appt with Zelda yesterday 12/6. Please print letter for pt and she can come by and pick it up

## 2021-01-03 ENCOUNTER — Telehealth: Payer: Self-pay | Admitting: Internal Medicine

## 2021-01-03 ENCOUNTER — Ambulatory Visit: Payer: Self-pay | Admitting: *Deleted

## 2021-01-03 ENCOUNTER — Other Ambulatory Visit: Payer: Self-pay | Admitting: Internal Medicine

## 2021-01-03 DIAGNOSIS — J020 Streptococcal pharyngitis: Secondary | ICD-10-CM

## 2021-01-03 MED ORDER — PSEUDOEPH-BROMPHEN-DM 30-2-10 MG/5ML PO SYRP: 5.0000 mL | ORAL_SOLUTION | Freq: Four times a day (QID) | ORAL | 0 refills | Status: DC | PRN

## 2021-01-03 NOTE — Telephone Encounter (Signed)
Noted. Please disregard first message sent

## 2021-01-03 NOTE — Telephone Encounter (Signed)
Reason for Disposition  [1] Continuous (nonstop) coughing interferes with work or school AND [2] no improvement using cough treatment per Care Advice  Answer Assessment - Initial Assessment Questions 1. ONSET: "When did the cough begin?"      Cough has gotten worse dry hacking cough.  The cough medicine Dr. Wynetta Emery ordered has not come in.   Is there something else she can order? 2. SEVERITY: "How bad is the cough today?"      It's a dry hacking cough.   It's hydrocodone 5 mg as needed for cough.   At night the cough is worse.   My throat is sore from coughing so much.    3. SPUTUM: "Describe the color of your sputum" (none, dry cough; clear, white, yellow, green)     Occasional clear mucus but mostly a dry hacking cough feels like the mucus is stuck in my throat and won't come up. 4. HEMOPTYSIS: "Are you coughing up any blood?" If so ask: "How much?" (flecks, streaks, tablespoons, etc.)     Not asked 5. DIFFICULTY BREATHING: "Are you having difficulty breathing?" If Yes, ask: "How bad is it?" (e.g., mild, moderate, severe)    - MILD: No SOB at rest, mild SOB with walking, speaks normally in sentences, can lie down, no retractions, pulse < 100.    - MODERATE: SOB at rest, SOB with minimal exertion and prefers to sit, cannot lie down flat, speaks in phrases, mild retractions, audible wheezing, pulse 100-120.    - SEVERE: Very SOB at rest, speaks in single words, struggling to breathe, sitting hunched forward, retractions, pulse > 120      No shortness of breath or chest tightness 6. FEVER: "Do you have a fever?" If Yes, ask: "What is your temperature, how was it measured, and when did it start?"     *No Answer* 7. CARDIAC HISTORY: "Do you have any history of heart disease?" (e.g., heart attack, congestive heart failure)      *No Answer* 8. LUNG HISTORY: "Do you have any history of lung disease?"  (e.g., pulmonary embolus, asthma, emphysema)     *No Answer* 9. PE RISK FACTORS: "Do you have a  history of blood clots?" (or: recent major surgery, recent prolonged travel, bedridden)     *No Answer* 10. OTHER SYMPTOMS: "Do you have any other symptoms?" (e.g., runny nose, wheezing, chest pain)       *No Answer* 11. PREGNANCY: "Is there any chance you are pregnant?" "When was your last menstrual period?"       *No Answer* 12. TRAVEL: "Have you traveled out of the country in the last month?" (e.g., travel history, exposures)       *No Answer*  Protocols used: Cough - Acute Non-Productive-A-AH

## 2021-01-03 NOTE — Telephone Encounter (Signed)
Pt seen Kara Hall on 12/6. Will forward to provider

## 2021-01-03 NOTE — Telephone Encounter (Signed)
Patients husband called in for patient asking if brompheniramine-pseudoephedrine is on back order at her pharmacy and wants prescriptions sent to  [facility], [place], [place] [address]  where  medicine is located

## 2021-01-03 NOTE — Telephone Encounter (Signed)
Rx sent 

## 2021-01-03 NOTE — Telephone Encounter (Signed)
Called [facility] [phone] to clarify if cough syrup had been filled. Pharmacy tech sent request of  medication back through insurance and was verified. Pharmacy tech reports medication  brompheniramine- pseudoephedrine DM 30-2-10 mg/5mg  syrup prescription should be ready for pick up  in 1 1/2 hours. Called patient to notify her to contact pharmacy in 1 1/2 hours to assure Rx is ready for pick up.   [id number] [address], [place]  Phone: [phone]  Fax: [phone]

## 2021-01-03 NOTE — Telephone Encounter (Signed)
Pt calling in stating that brompheniramine-pseudoephedrine is on back order at her pharmacy and wants prescriptions sent to  [place], [place] [address] Phone: [phone]

## 2021-01-03 NOTE — Telephone Encounter (Signed)
°  Chief Complaint: cough medication Dr. Wynetta Emery ordered has not come in..   Requesting hydrocodone cough medicine in it's place Symptoms: dry hacking cough that is worse at night Frequency: constantly but worse at night Pertinent Negatives: Patient denies chest tightness or shortness of breath Disposition: [] ED /[] Urgent Care (no appt availability in office) / [] Appointment(In office/virtual)/ []  [facility]/ [] Home Care/ [] Refused Recommended Disposition  Additional Notes: I've sent a note to Dr. Karle Plumber at [facility] to see if she can order an alternative cough medication since the one she ordered has not come into the pharmacy yet.   Pt is requesting the hydrocodone.   That's worked in the past.  Notes sent to [employer]

## 2021-01-03 NOTE — Telephone Encounter (Signed)
PC received from on-call nurse stating that pt called requesting that brompheniramine-pseudoephedrine-DM cough syrup be sent to [facility] on [address] not [facility].  I told her I will send it to [facility].  After speaking with her, I looked in pt's chart and see that rxn was already routed to [facility] on [address].

## 2021-01-04 ENCOUNTER — Ambulatory Visit (INDEPENDENT_AMBULATORY_CARE_PROVIDER_SITE_OTHER): Payer: 59 | Admitting: Physical Medicine and Rehabilitation

## 2021-01-04 ENCOUNTER — Ambulatory Visit: Payer: Self-pay

## 2021-01-04 ENCOUNTER — Ambulatory Visit: Payer: 59

## 2021-01-04 ENCOUNTER — Other Ambulatory Visit: Payer: Self-pay

## 2021-01-04 ENCOUNTER — Encounter: Payer: Self-pay | Admitting: Physical Medicine and Rehabilitation

## 2021-01-04 VITALS — BP 125/85 | HR 78

## 2021-01-04 DIAGNOSIS — M5416 Radiculopathy, lumbar region: Secondary | ICD-10-CM

## 2021-01-04 MED ORDER — METHYLPREDNISOLONE ACETATE 80 MG/ML IJ SUSP
80.0000 mg | Freq: Once | INTRAMUSCULAR | Status: AC
  Administered 2021-01-04: 11:00:00 80 mg

## 2021-01-04 NOTE — Procedures (Signed)
Lumbosacral Transforaminal Epidural Steroid Injection - Sub-Pedicular Approach with Fluoroscopic Guidance  Patient: Kara Hall      Date of Birth: Oct 21, 1970 MRN: [id number] PCP: Ladell Pier, MD      Visit Date: 01/04/2021   Universal Protocol:    Date/Time: 01/04/2021  Consent Given By: the patient  Position: PRONE  Additional Comments: Vital signs were monitored before and after the procedure. Patient was prepped and draped in the usual sterile fashion. The correct patient, procedure, and site was verified.   Injection Procedure Details:   Procedure diagnoses: Lumbar radiculopathy [M54.16]    Meds Administered:  Meds ordered this encounter  Medications   methylPREDNISolone acetate (DEPO-MEDROL) injection 80 mg    Laterality: Left  Location/Site: L5  Needle:5.0 in., 22 ga.  Short bevel or Quincke spinal needle  Needle Placement: Transforaminal  Findings:    -Comments: Excellent flow of contrast along the nerve, nerve root and into the epidural space.  Procedure Details: After squaring off the end-plates to get a true AP view, the C-arm was positioned so that an oblique view of the foramen as noted above was visualized. The target area is just inferior to the "nose of the scotty dog" or sub pedicular. The soft tissues overlying this structure were infiltrated with 2-3 ml. of 1% Lidocaine without Epinephrine.  The spinal needle was inserted toward the target using a "trajectory" view along the fluoroscope beam.  Under AP and lateral visualization, the needle was advanced so it did not puncture dura and was located close the 6 O'Clock position of the pedical in AP tracterory. Biplanar projections were used to confirm position. Aspiration was confirmed to be negative for CSF and/or blood. A 1-2 ml. volume of Isovue-250 was injected and flow of contrast was noted at each level. Radiographs were obtained for documentation purposes.   After attaining the  desired flow of contrast documented above, a 0.5 to 1.0 ml test dose of 0.25% Marcaine was injected into each respective transforaminal space.  The patient was observed for 90 seconds post injection.  After no sensory deficits were reported, and normal lower extremity motor function was noted,   the above injectate was administered so that equal amounts of the injectate were placed at each foramen (level) into the transforaminal epidural space.   Additional Comments:  No complications occurred Dressing: 2 x 2 sterile gauze and Band-Aid    Post-procedure details: Patient was observed during the procedure. Post-procedure instructions were reviewed.  Patient left the clinic in stable condition.

## 2021-01-04 NOTE — Patient Instructions (Signed)

## 2021-01-04 NOTE — Progress Notes (Signed)
Pt state lower back pain that travels down her left leg to the foot. Pt state her left calf muscle his tingling and tightness. Pt state walking, standing and sitting makes the pain worse. Pt state she takes pain meds, heating and ice to help ease her pain.  Numeric Pain Rating Scale and Functional Assessment Average Pain 8   In the last MONTH (on 0-10 scale) has pain interfered with the following?  1. General activity like being  able to carry out your everyday physical activities such as walking, climbing stairs, carrying groceries, or moving a chair?  Rating(10)   +Driver, -BT, -Dye Allergies.

## 2021-01-04 NOTE — Progress Notes (Signed)
Kara Hall - 50 y.o. female MRN [id number]  Date of birth: 1970-03-28  Office Visit Note: Visit Date: 01/04/2021 PCP: Ladell Pier, MD Referred by: Ladell Pier, MD  Subjective: Chief Complaint  Patient presents with   Lower Back - Pain   Left Leg - Pain   HPI:  Kara Hall is a 50 y.o. female who comes in today for planned repeat Left L5-S1  Lumbar Transforaminal epidural steroid injection with fluoroscopic guidance.  The patient has failed conservative care including home exercise, medications, time and activity modification.  This injection will be diagnostic and hopefully therapeutic.  Please see requesting physician notes for further details and justification. Patient received more than 50% pain relief from prior injection.   Referring: Dr. Meridee Score  ROS Otherwise per HPI.  Assessment & Plan: Visit Diagnoses:    ICD-10-CM   1. Lumbar radiculopathy  M54.16 XR C-ARM NO REPORT    methylPREDNISolone acetate (DEPO-MEDROL) injection 80 mg    Epidural Steroid injection      Plan: No additional findings.   Meds & Orders:  Meds ordered this encounter  Medications   methylPREDNISolone acetate (DEPO-MEDROL) injection 80 mg    Orders Placed This Encounter  Procedures   XR C-ARM NO REPORT   Epidural Steroid injection    Follow-up: Return if symptoms worsen or fail to improve.   Procedures: No procedures performed  Lumbosacral Transforaminal Epidural Steroid Injection - Sub-Pedicular Approach with Fluoroscopic Guidance  Patient: Kara Hall      Date of Birth: [birth year] MRN: [id number] PCP: Ladell Pier, MD      Visit Date: 01/04/2021   Universal Protocol:    Date/Time: 01/04/2021  Consent Given By: the patient  Position: PRONE  Additional Comments: Vital signs were monitored before and after the procedure. Patient was prepped and draped in the usual sterile fashion. The correct patient, procedure, and site was  verified.   Injection Procedure Details:   Procedure diagnoses: Lumbar radiculopathy [M54.16]    Meds Administered:  Meds ordered this encounter  Medications   methylPREDNISolone acetate (DEPO-MEDROL) injection 80 mg    Laterality: Left  Location/Site: L5  Needle:5.0 in., 22 ga.  Short bevel or Quincke spinal needle  Needle Placement: Transforaminal  Findings:    -Comments: Excellent flow of contrast along the nerve, nerve root and into the epidural space.  Procedure Details: After squaring off the end-plates to get a true AP view, the C-arm was positioned so that an oblique view of the foramen as noted above was visualized. The target area is just inferior to the "nose of the scotty dog" or sub pedicular. The soft tissues overlying this structure were infiltrated with 2-3 ml. of 1% Lidocaine without Epinephrine.  The spinal needle was inserted toward the target using a "trajectory" view along the fluoroscope beam.  Under AP and lateral visualization, the needle was advanced so it did not puncture dura and was located close the 6 O'Clock position of the pedical in AP tracterory. Biplanar projections were used to confirm position. Aspiration was confirmed to be negative for CSF and/or blood. A 1-2 ml. volume of Isovue-250 was injected and flow of contrast was noted at each level. Radiographs were obtained for documentation purposes.   After attaining the desired flow of contrast documented above, a 0.5 to 1.0 ml test dose of 0.25% Marcaine was injected into each respective transforaminal space.  The patient was observed for 90 seconds post injection.  After no sensory  deficits were reported, and normal lower extremity motor function was noted,   the above injectate was administered so that equal amounts of the injectate were placed at each foramen (level) into the transforaminal epidural space.   Additional Comments:  No complications occurred Dressing: 2 x 2 sterile gauze and  Band-Aid    Post-procedure details: Patient was observed during the procedure. Post-procedure instructions were reviewed.  Patient left the clinic in stable condition.    Clinical History: MRI lumbar spine:   INDICATION: Low back pain which radiates down the left leg.   TECHNIQUE: Sagittal and axial T1 and T2-weighted sequences were performed. Additional sagittal STIR images were performed.   COMPARISON: None available   FINDINGS:  #  Osseous structures: No compression fracture. There is marrow edema in the left pedicle of L5 suggesting a stress reaction.  #  Alignment: Normal.  #  Modic changes: Mild type I Modic change in the endplates adjacent to the degenerated L4-5 disc.  #  Conus medullaris: Normal. Terminates at L1 with no evidence of tethering.  #  Lower thoracic segments: No significant abnormality.  #  Additional findings: Bilateral ovarian cystic lesions. The one on the left measures at least 3.5 cm.   #  L1-2: Unremarkable  #    #  L2-3: Unremarkable  #    #  L3-4: Mild facet joint arthritis. No spinal or foraminal stenosis.  #    #  L4-5: Central disc protrusion with impingement on the thecal sac. Moderate facet joint arthritis.  #    #  L5-S1: Mild degenerative disc disease. Mild facet joint arthritis on the right and moderate facet joint arthritis on the left.    IMPRESSION:  1.  Central disc protrusion L4-5 with impingement on the thecal sac.  2.  Facet joint arthritis lower lumbar spine.  3.  Marrow edema in the left pedicle of L5 suggesting a stress reaction.   Electronically Signed by: Ross Marcus on 06/29/2020 1:05 PM     Objective:  VS:  HT:     WT:    BMI:      BP:125/85   HR:78bpm   TEMP: ( )   RESP:  Physical Exam Vitals and nursing note reviewed.  Constitutional:      General: She is not in acute distress.    Appearance: Normal appearance. She is not ill-appearing.  HENT:     Head: Normocephalic and atraumatic.     Right Ear: External  ear normal.     Left Ear: External ear normal.  Eyes:     Extraocular Movements: Extraocular movements intact.  Cardiovascular:     Rate and Rhythm: Normal rate.     Pulses: Normal pulses.  Pulmonary:     Effort: Pulmonary effort is normal. No respiratory distress.  Abdominal:     General: There is no distension.     Palpations: Abdomen is soft.  Musculoskeletal:        General: Tenderness present.     Cervical back: Neck supple.     Right lower leg: No edema.     Left lower leg: No edema.     Comments: Patient has good distal strength with no pain over the greater trochanters.  No clonus or focal weakness.  Skin:    Findings: No erythema, lesion or rash.  Neurological:     General: No focal deficit present.     Mental Status: She is alert and oriented to person, place, and time.  Sensory: No sensory deficit.     Motor: No weakness or abnormal muscle tone.     Coordination: Coordination normal.  Psychiatric:        Mood and Affect: Mood normal.        Behavior: Behavior normal.     Imaging: No results found.

## 2021-01-11 ENCOUNTER — Ambulatory Visit: Payer: Self-pay

## 2021-01-11 NOTE — Telephone Encounter (Signed)
Patient called, left VM to return the call to the office to discuss symptoms with a nurse.  Summary: persistent cough   Patient called in with persistent cough.,no appt until Jan 3. She is asking for meds to help, because its effecting her at work,  to be sent to [place], [place].  Phone: [phone]  Fax: [phone]

## 2021-01-11 NOTE — Telephone Encounter (Signed)
2nd attempt. Pt called, left VM to call back to discuss symptoms.

## 2021-01-11 NOTE — Telephone Encounter (Signed)
Patient called, left VM to return the call to the office to discuss symptoms with a nurse. Unable to reach patient after 3 attempts by [facility] NT, routing to the provider for resolution per protocol.    Summary: persistent cough   Patient called in with persistent cough.,no appt until Jan 3. She is asking for meds to help, because its effecting her at work,  to be sent to [place], [place].  Phone: [phone]  Fax: [phone]

## 2021-01-12 ENCOUNTER — Other Ambulatory Visit: Payer: Self-pay | Admitting: Internal Medicine

## 2021-01-12 DIAGNOSIS — F4323 Adjustment disorder with mixed anxiety and depressed mood: Secondary | ICD-10-CM

## 2021-01-12 DIAGNOSIS — F419 Anxiety disorder, unspecified: Secondary | ICD-10-CM

## 2021-01-12 MED ORDER — BENZONATATE 100 MG PO CAPS: 100.0000 mg | ORAL_CAPSULE | Freq: Three times a day (TID) | ORAL | 0 refills | Status: DC | PRN

## 2021-01-12 NOTE — Addendum Note (Signed)
Addended by: Karle Plumber B on: 01/12/2021 07:29 AM   Modules accepted: Orders

## 2021-01-12 NOTE — Telephone Encounter (Signed)
Requested Prescriptions  Pending Prescriptions Disp Refills   sertraline (ZOLOFT) 100 MG tablet [Pharmacy Med Name: SERTRALINE 100MG  TABLETS] 90 tablet 0    Sig: TAKE 1 TABLET(100 MG) BY MOUTH DAILY     Psychiatry:  Antidepressants - SSRI Passed - 01/12/2021  6:20 AM      Passed - Valid encounter within last 6 months    Recent Outpatient Visits          3 weeks ago Strep throat   [place], [name], NP   3 months ago Essential hypertension   [place], [name], RPH-CPP   4 months ago Essential hypertension   [place], [name], RPH-CPP   4 months ago Essential hypertension   [place], MD   7 months ago Essential hypertension, benign   [place], [name], [name]      Future Appointments            In 1 month Wynetta Emery, Dalbert Batman, MD [place]

## 2021-01-12 NOTE — Telephone Encounter (Signed)
Sent pt a MyChart message

## 2021-01-20 ENCOUNTER — Ambulatory Visit: Payer: 59

## 2021-01-22 ENCOUNTER — Other Ambulatory Visit: Payer: Self-pay | Admitting: Internal Medicine

## 2021-01-22 DIAGNOSIS — E782 Mixed hyperlipidemia: Secondary | ICD-10-CM

## 2021-01-22 NOTE — Telephone Encounter (Signed)
Requested Prescriptions  Pending Prescriptions Disp Refills   pravastatin (PRAVACHOL) 10 MG tablet [Pharmacy Med Name: PRAVASTATIN 10MG  TABLETS] 12 tablet 2    Sig: TAKE 1 TABLET BY MOUTH EVERY MONDAY, WEDNESDAY AND FRIDAY     Cardiovascular:  Antilipid - Statins Failed - 01/22/2021  3:41 AM      Failed - Total Cholesterol in normal range and within 360 days    Cholesterol, Total  Date Value Ref Range Status  05/19/2020 274 (H) 100 - 199 mg/dL Final         Failed - LDL in normal range and within 360 days    LDL Chol Calc (NIH)  Date Value Ref Range Status  05/19/2020 195 (H) 0 - 99 mg/dL Final         Passed - HDL in normal range and within 360 days    HDL  Date Value Ref Range Status  05/19/2020 60 >39 mg/dL Final         Passed - Triglycerides in normal range and within 360 days    Triglycerides  Date Value Ref Range Status  05/19/2020 110 0 - 149 mg/dL Final         Passed - Patient is not pregnant      Passed - Valid encounter within last 12 months    Recent Outpatient Visits          1 month ago Strep throat   [place], [name], NP   3 months ago Essential hypertension   [place], [name], RPH-CPP   4 months ago Essential hypertension   [place], [name], RPH-CPP   5 months ago Essential hypertension   [place], MD   8 months ago Essential hypertension, benign   [place], [name], [name]      Future Appointments            In 3 weeks Ladell Pier, MD [place]

## 2021-02-11 ENCOUNTER — Other Ambulatory Visit: Payer: Self-pay | Admitting: Internal Medicine

## 2021-02-11 DIAGNOSIS — K219 Gastro-esophageal reflux disease without esophagitis: Secondary | ICD-10-CM

## 2021-02-11 NOTE — Telephone Encounter (Signed)
Requested Prescriptions  Pending Prescriptions Disp Refills   omeprazole (PRILOSEC) 20 MG capsule [Pharmacy Med Name: OMEPRAZOLE 20MG  CAPSULES] 90 capsule 0    Sig: TAKE 1 CAPSULE(20 MG) BY MOUTH DAILY     Gastroenterology: Proton Pump Inhibitors Passed - 02/11/2021  6:23 AM      Passed - Valid encounter within last 12 months    Recent Outpatient Visits          1 month ago Strep throat   [place], [name], NP   4 months ago Essential hypertension   [place], RPH-CPP   5 months ago Essential hypertension   [place], [name], RPH-CPP   5 months ago Essential hypertension   [place], MD   8 months ago Essential hypertension, benign   [place], [name], [name]      Future Appointments            In 5 days Ladell Pier, MD [place]

## 2021-02-12 ENCOUNTER — Other Ambulatory Visit: Payer: Self-pay | Admitting: Internal Medicine

## 2021-02-12 DIAGNOSIS — I10 Essential (primary) hypertension: Secondary | ICD-10-CM

## 2021-02-13 ENCOUNTER — Ambulatory Visit: Payer: Self-pay | Admitting: *Deleted

## 2021-02-13 NOTE — Telephone Encounter (Deleted)
Summary: cough and medicatiob adjustment   Pt's husband called saying her cough is hanging on and the medication Dr. Wynetta Emery gave her is not helping.  He said also that her antidepressant may need adjusting.  He wanted hr to be seen this Wednesday but there is not appts available.   CB# [phone]

## 2021-02-13 NOTE — Telephone Encounter (Signed)
Pt's husband called saying her cough is hanging on and the medication Dr. Wynetta Emery gave her is not helping.  He said also that her antidepressant may need adjusting.  He wanted hr to be seen this Wednesday but there is not appts available.     Chief Complaint: Non-productive cough Symptoms: Cough Frequency: Started 1 month ago Pertinent Negatives: Patient denies fever, SOB Disposition: [] ED /[] Urgent Care (no appt availability in office) / [] Appointment(In office/virtual)/ []  [facility]/ [] Home Care/ [] Refused Recommended Disposition /[] [facility]/ [x]  Follow-up with PCP Additional Notes: Pt. Is off work Wednesday. Asking to be seen that day. Please advise.   Answer Assessment - Initial Assessment Questions 1. ONSET: "When did the cough begin?"      1 month ago 2. SEVERITY: "How bad is the cough today?"      Moderate 3. SPUTUM: "Describe the color of your sputum" (none, dry cough; clear, white, yellow, green)     No 4. HEMOPTYSIS: "Are you coughing up any blood?" If so ask: "How much?" (flecks, streaks, tablespoons, etc.)     No 5. DIFFICULTY BREATHING: "Are you having difficulty breathing?" If Yes, ask: "How bad is it?" (e.g., mild, moderate, severe)    - MILD: No SOB at rest, mild SOB with walking, speaks normally in sentences, can lie down, no retractions, pulse < 100.    - MODERATE: SOB at rest, SOB with minimal exertion and prefers to sit, cannot lie down flat, speaks in phrases, mild retractions, audible wheezing, pulse 100-120.    - SEVERE: Very SOB at rest, speaks in single words, struggling to breathe, sitting hunched forward, retractions, pulse > 120      No 6. FEVER: "Do you have a fever?" If Yes, ask: "What is your temperature, how was it measured, and when did it start?"     No 7. CARDIAC HISTORY: "Do you have any history of heart disease?" (e.g., heart attack, congestive heart failure)      No 8. LUNG HISTORY: "Do you have any history of lung  disease?"  (e.g., pulmonary embolus, asthma, emphysema)     No 9. PE RISK FACTORS: "Do you have a history of blood clots?" (or: recent major surgery, recent prolonged travel, bedridden)     No 10. OTHER SYMPTOMS: "Do you have any other symptoms?" (e.g., runny nose, wheezing, chest pain)       No 11. PREGNANCY: "Is there any chance you are pregnant?" "When was your last menstrual period?"       No 12. TRAVEL: "Have you traveled out of the country in the last month?" (e.g., travel history, exposures)       No  Protocols used: Cough - Acute Non-Productive-A-AH

## 2021-02-13 NOTE — Telephone Encounter (Signed)
Requested Prescriptions  Pending Prescriptions Disp Refills   valsartan (DIOVAN) 40 MG tablet [Pharmacy Med Name: VALSARTAN 40MG  TABLETS] 90 tablet 0    Sig: TAKE 1 TABLET(40 MG) BY MOUTH DAILY     Cardiovascular:  Angiotensin Receptor Blockers Failed - 02/12/2021  2:32 PM      Failed - Cr in normal range and within 180 days    Creat  Date Value Ref Range Status  11/23/2014 0.48 (L) 0.50 - 1.10 mg/dL Final   Creatinine, Ser  Date Value Ref Range Status  08/31/2020 0.54 (L) 0.57 - 1.00 mg/dL Final         Passed - K in normal range and within 180 days    Potassium  Date Value Ref Range Status  08/31/2020 3.9 3.5 - 5.2 mmol/L Final         Passed - Patient is not pregnant      Passed - Last BP in normal range    BP Readings from Last 1 Encounters:  01/04/21 125/85         Passed - Valid encounter within last 6 months    Recent Outpatient Visits          1 month ago Strep throat   [name], [name], NP   4 months ago Essential hypertension   [name], RPH-CPP   5 months ago Essential hypertension   [name], [name], RPH-CPP   5 months ago Essential hypertension   [name], MD   9 months ago Essential hypertension, benign   [name], [name], [name]      Future Appointments            In 3 days Ladell Pier, MD [place]

## 2021-02-15 NOTE — Telephone Encounter (Signed)
Pt already has an appt scheduled for 2/2

## 2021-02-16 ENCOUNTER — Other Ambulatory Visit: Payer: Self-pay

## 2021-02-16 ENCOUNTER — Ambulatory Visit: Payer: Self-pay | Attending: Internal Medicine | Admitting: Internal Medicine

## 2021-02-16 ENCOUNTER — Encounter: Payer: Self-pay | Admitting: Internal Medicine

## 2021-02-16 VITALS — BP 118/85 | HR 85 | Resp 16 | Wt 197.8 lb

## 2021-02-16 DIAGNOSIS — G43009 Migraine without aura, not intractable, without status migrainosus: Secondary | ICD-10-CM

## 2021-02-16 DIAGNOSIS — Z6379 Other stressful life events affecting family and household: Secondary | ICD-10-CM

## 2021-02-16 DIAGNOSIS — R053 Chronic cough: Secondary | ICD-10-CM

## 2021-02-16 DIAGNOSIS — F321 Major depressive disorder, single episode, moderate: Secondary | ICD-10-CM

## 2021-02-16 DIAGNOSIS — R0982 Postnasal drip: Secondary | ICD-10-CM

## 2021-02-16 DIAGNOSIS — I10 Essential (primary) hypertension: Secondary | ICD-10-CM

## 2021-02-16 MED ORDER — PROPRANOLOL HCL 10 MG PO TABS: 10.0000 mg | ORAL_TABLET | Freq: Two times a day (BID) | ORAL | 3 refills | Status: DC

## 2021-02-16 MED ORDER — AMLODIPINE BESYLATE 5 MG PO TABS: 5.0000 mg | ORAL_TABLET | Freq: Every day | ORAL | 1 refills | Status: DC

## 2021-02-16 MED ORDER — BUTALBITAL-APAP-CAFFEINE 50-325-40 MG PO TABS: 1.0000 | ORAL_TABLET | Freq: Every day | ORAL | 1 refills | Status: DC | PRN

## 2021-02-16 MED ORDER — FLUTICASONE PROPIONATE 50 MCG/ACT NA SUSP: 2.0000 | Freq: Every day | NASAL | 1 refills | Status: DC

## 2021-02-16 MED ORDER — LORATADINE 10 MG PO TABS: 10.0000 mg | ORAL_TABLET | Freq: Every day | ORAL | 2 refills | Status: DC

## 2021-02-16 NOTE — Patient Instructions (Signed)
Migraine Headache A migraine headache is an intense, throbbing pain on one side or both sides of the head. Migraine headaches may also cause other symptoms, such as nausea, vomiting, and sensitivity to light and noise. A migraine headache can last from 4 hours to 3 days. Talk with your doctor about what things may bring on (trigger) your migraine headaches. What are the causes? The exact cause of this condition is not known. However, a migraine may be caused when nerves in the brain become irritated and release chemicals that cause inflammation of blood vessels. This inflammation causes pain. This condition may be triggered or caused by: Drinking alcohol. Smoking. Taking medicines, such as: Medicine used to treat chest pain (nitroglycerin). Birth control pills. Estrogen. Certain blood pressure medicines. Eating or drinking products that contain nitrates, glutamate, aspartame, or tyramine. Aged cheeses, chocolate, or caffeine may also be triggers. Doing physical activity. Other things that may trigger a migraine headache include: Menstruation. Pregnancy. Hunger. Stress. Lack of sleep or too much sleep. Weather changes. Fatigue. What increases the risk? The following factors may make you more likely to experience migraine headaches: Being a certain age. This condition is more common in people who are 25-55 years old. Being female. Having a family history of migraine headaches. Being Caucasian. Having a mental health condition, such as depression or anxiety. Being obese. What are the signs or symptoms? The main symptom of this condition is pulsating or throbbing pain. This pain may: Happen in any area of the head, such as on one side or both sides. Interfere with daily activities. Get worse with physical activity. Get worse with exposure to bright lights or loud noises. Other symptoms may include: Nausea. Vomiting. Dizziness. General sensitivity to bright lights, loud noises, or  smells. Before you get a migraine headache, you may get warning signs (an aura). An aura may include: Seeing flashing lights or having blind spots. Seeing bright spots, halos, or zigzag lines. Having tunnel vision or blurred vision. Having numbness or a tingling feeling. Having trouble talking. Having muscle weakness. Some people have symptoms after a migraine headache (postdromal phase), such as: Feeling tired. Difficulty concentrating. How is this diagnosed? A migraine headache can be diagnosed based on: Your symptoms. A physical exam. Tests, such as: CT scan or an MRI of the head. These imaging tests can help rule out other causes of headaches. Taking fluid from the spine (lumbar puncture) and analyzing it (cerebrospinal fluid analysis, or CSF analysis). How is this treated? This condition may be treated with medicines that: Relieve pain. Relieve nausea. Prevent migraine headaches. Treatment for this condition may also include: Acupuncture. Lifestyle changes like avoiding foods that trigger migraine headaches. Biofeedback. Cognitive behavioral therapy. Follow these instructions at home: Medicines Take over-the-counter and prescription medicines only as told by your health care provider. Ask your health care provider if the medicine prescribed to you: Requires you to avoid driving or using heavy machinery. Can cause constipation. You may need to take these actions to prevent or treat constipation: Drink enough fluid to keep your urine pale yellow. Take over-the-counter or prescription medicines. Eat foods that are high in fiber, such as beans, whole grains, and fresh fruits and vegetables. Limit foods that are high in fat and processed sugars, such as fried or sweet foods. Lifestyle Do not drink alcohol. Do not use any products that contain nicotine or tobacco, such as cigarettes, e-cigarettes, and chewing tobacco. If you need help quitting, ask your health care  provider. Get at least 8   hours of sleep every night. Find ways to manage stress, such as meditation, deep breathing, or yoga. General instructions   Keep a journal to find out what may trigger your migraine headaches. For example, write down: What you eat and drink. How much sleep you get. Any change to your diet or medicines. If you have a migraine headache: Avoid things that make your symptoms worse, such as bright lights. It may help to lie down in a dark, quiet room. Do not drive or use heavy machinery. Ask your health care provider what activities are safe for you while you are experiencing symptoms. Keep all follow-up visits as told by your health care provider. This is important. Contact a health care provider if: You develop symptoms that are different or more severe than your usual migraine headache symptoms. You have more than 15 headache days in one month. Get help right away if: Your migraine headache becomes severe. Your migraine headache lasts longer than 72 hours. You have a fever. You have a stiff neck. You have vision loss. Your muscles feel weak or like you cannot control them. You start to lose your balance often. You have trouble walking. You faint. You have a seizure. Summary A migraine headache is an intense, throbbing pain on one side or both sides of the head. Migraines may also cause other symptoms, such as nausea, vomiting, and sensitivity to light and noise. This condition may be treated with medicines and lifestyle changes. You may also need to avoid certain things that trigger a migraine headache. Keep a journal to find out what may trigger your migraine headaches. Contact your health care provider if you have more than 15 headache days in a month or you develop symptoms that are different or more severe than your usual migraine headache symptoms. This information is not intended to replace advice given to you by your health care provider. Make sure you  discuss any questions you have with your health care provider. Document Revised: 04/25/2018 Document Reviewed: 02/13/2018 [employer] Patient Education  2022 [employer]  

## 2021-02-16 NOTE — Progress Notes (Signed)
Patient ID: KENNADI ALBANY, female    DOB: Sep 29, 1970  MRN: [id number]  CC: Hypertension and Headache   Subjective: Kara Hall is a 51 y.o. female who presents with several concerns.  Her husband Kara Hall is with her. Her concerns today include:  Patient with history of HTN, thyroid cancer (dx age 4, s/p thyroidectomy), anxiety/dep, GERD, lumbar radiculopathy, HL.   HTN: Patient is on Norvasc and valsartan 40 mg daily.  Her last visit with me in August of last year, she had seen our clinical pharmacist for blood pressure check.  Blood pressure was found to be a little low so Norvasc was decreased from 10 mg to 5 mg daily.  However patient states she was still being prescribed the 10 mg tablets and she has been breaking them in half.  However, they do not split well so sometimes she thinks she may get a little more or less than 5 mg.  She continues to be stressed out and helping to care for her mother who is 1 years old.  She states that her mother wants to get takeouts every day and she has to be the one to do it.  Her mother makes her feel guilty when she is not able to make it over there. -She is also very stressed out at work and when she is stressed she feels her blood pressure runs higher.  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 She is on Zoloft 100 mg. Feels she would benefit from some counseling.  Complains of daily headaches for the past 1 month.  She used to have migraines as a child. -Headaches are usually right-sided over the eye or in both temple areas. Endorses photophobia and phenyl phobia, nausea and dizziness with the headaches. -Headaches are worse with coughing and stress. -She has been taking Tylenol 500 mg about 6 to 8 tablets a day on average.  Complains of persistent cough that started 2 weeks before Christmas.  She had a telephone visit with our nurse practitioner on [name] for her symptoms that included cough, sore throat, bilateral ear fullness,  nasal blockage, headache.  She was prescribed amoxicillin and brompheniramine-pseudoephedrine-DM syrup.  She had a hard time finding the syrup at pharmacies but was eventually able to find it at [facility].  She did not find the syrup helpful. The cough has persisted.  She describes it as a dry hacking cough that wakes her at night.  Cough is also disruptive to her during work hours.  She feels she may have allergies.  She clears her throat a lot.  She endorses a lot of drainage at the back of the throat.  She endorses sneezing a lot. -She has acid reflux but feels it is under good control with the PPI that she is on. -She does not smoke.    Patient Active Problem List   Diagnosis Date Noted   Mixed hyperlipidemia 08/19/2020   Obesity (BMI 30.0-34.9) 08/19/2020   Stressful life event affecting family 08/19/2020   Plantar fasciitis, bilateral 08/19/2020   Status post hysterectomy 12/18/2018   Prolonged Q-T interval on ECG 11/24/2018   Essential hypertension 11/24/2018   Dyspareunia in female 03/24/2018   Increased body mass index 03/24/2018   Carpal tunnel syndrome on both sides 12/10/2017   Achilles tendon contracture, left 10/04/2016   Displaced fracture of fifth metatarsal bone of left foot with delayed healing 05/07/2016   Bilateral wrist pain 11/28/2015   History of thyroid cancer 08/09/2013   Anxiety 01/12/2013  Current Outpatient Medications on File Prior to Visit  Medication Sig Dispense Refill   ALPRAZolam (XANAX) 0.5 MG tablet Take 0.5-1 tablets (0.25-0.5 mg total) by mouth daily as needed for anxiety. 15 tablet 0   brompheniramine-pseudoephedrine-DM 30-2-10 MG/5ML syrup Take 5 mLs by mouth 4 (four) times daily as needed. (Patient not taking: Reported on 02/16/2021) 240 mL 0   levothyroxine (SYNTHROID, LEVOTHROID) 125 MCG tablet Take 125 mcg by mouth daily before breakfast.      omeprazole (PRILOSEC) 20 MG capsule TAKE 1 CAPSULE(20 MG) BY MOUTH DAILY 90 capsule 0   pravastatin  (PRAVACHOL) 10 MG tablet TAKE 1 TABLET BY MOUTH EVERY MONDAY, WEDNESDAY AND FRIDAY 12 tablet 2   pregabalin (LYRICA) 75 MG capsule Take 1 capsule (75 mg total) by mouth 2 (two) times daily. 60 capsule 1   sertraline (ZOLOFT) 100 MG tablet TAKE 1 TABLET(100 MG) BY MOUTH DAILY 90 tablet 0   valsartan (DIOVAN) 40 MG tablet TAKE 1 TABLET(40 MG) BY MOUTH DAILY 90 tablet 0   Current Facility-Administered Medications on File Prior to Visit  Medication Dose Route Frequency Provider Last Rate Last Admin   triamcinolone acetonide (KENALOG) 10 MG/ML injection 10 mg  10 mg Other Once Landis Martins, DPM        Allergies  Allergen Reactions   Other     BLOOD REFUSAL    Sulfa Antibiotics Other (See Comments) and Nausea And Vomiting    Severe bladder, and kidney infection   Trazodone And Nefazodone Other (See Comments)    Headache every am     Social History   Socioeconomic History   Marital status: Married    Spouse name: Kara Hall   Number of children: 3   Years of education: 12+   Highest education level: Not on file  Occupational History   Occupation: [employer]   Occupation: Cosmetologist    Employer: [employer]  Tobacco Use   Smoking status: Never   Smokeless tobacco: Never  Vaping Use   Vaping Use: Never used  Substance and Sexual Activity   Alcohol use: Yes    Comment: occ   Drug use: No   Sexual activity: Yes    Partners: Male    Birth control/protection: Surgical    Comment: Tubal ligation  Other Topics Concern   Not on file  Social History Narrative   Divorce from previous husband in 06/2016.   Now remarried 07/2016.   Lives at home with her new husband and cat.   She has three grown children who live with her husband. She has a good relationship with her children.   Her father passed away 05/25/2016.   Social Determinants of Health   Financial Resource Strain: Not on file  Food Insecurity: Not on file  Transportation Needs: Not on file  Physical Activity: Not on  file  Stress: Not on file  Social Connections: Not on file  Intimate Partner Violence: Not on file    Family History  Problem Relation Age of Onset   Heart disease Mother    Hyperlipidemia Mother    Heart disease Father    Hyperlipidemia Father    Diabetes Father    Heart disease Brother    Hyperlipidemia Brother    Hypertension Brother    Heart disease Brother    Hyperlipidemia Brother     Past Surgical History:  Procedure Laterality Date   CYSTOSCOPY N/A 12/18/2018   Procedure: CYSTOSCOPY;  Surgeon: Jonelle Sidle, MD;  Location: [facility];  Service: Gynecology;  Laterality: N/A;   LAPAROSCOPIC LYSIS OF ADHESIONS N/A 12/18/2018   Procedure: LAPAROSCOPIC LYSIS OF ADHESIONS;  Surgeon: Jonelle Sidle, MD;  Location: [place];  Service: Gynecology;  Laterality: N/A;   ROBOTIC ASSISTED LAPAROSCOPIC HYSTERECTOMY AND SALPINGECTOMY Bilateral 12/18/2018   Procedure: XI ROBOTIC ASSISTED LAPAROSCOPIC HYSTERECTOMY AND BILATERAL SALPINGECTOMY;  Surgeon: Jonelle Sidle, MD;  Location: [place];  Service: Gynecology;  Laterality: Bilateral;   THROMBECTOMY Right 2001   leg; after 3rd child was born; for deep vein thrombosis   THYROIDECTOMY Right 08/28/2013   Procedure: RIGHT THYROID LOBECTOMY POSSIBLE TOTAL THYROIDECTOMY;  Surgeon: Jodi Marble, MD;  Location: [place];  Service: ENT;  Laterality: Right;   THYROIDECTOMY Left 09/10/2013   Procedure: COMPLETE LEFT THYROIDECTOMY;  Surgeon: Jodi Marble, MD;  Location: [place];  Service: ENT;  Laterality: Left;   TUBAL LIGATION  2001    ROS: Review of Systems Negative except as stated above  PHYSICAL EXAM: BP 118/85    Pulse 85    Resp 16    Wt 197 lb 12.8 oz (89.7 kg)    LMP 01/08/2019    SpO2 98%    BMI 31.93 kg/m   Physical Exam  General appearance - alert, well appearing, and in no distress Mental status -patient appears stressed.  She has pressured speech at  times. Nose - normal and patent, no erythema, discharge or polyps Mouth - mucous membranes moist, pharynx normal without lesions Neck - supple, no significant adenopathy Chest - clear to auscultation, no wheezes, rales or rhonchi, symmetric air entry Heart - normal rate, regular rhythm, normal S1, S2, no murmurs, rubs, clicks or gallops Neurological - cranial nerves II through XII intact, motor and sensory grossly normal bilaterally Extremities - peripheral pulses normal, no pedal edema, no clubbing or cyanosis   CMP Latest Ref Rng & Units 08/31/2020 05/19/2020 12/19/2018  Glucose 65 - 99 mg/dL 91 102(H) 122(H)  BUN 6 - 24 mg/dL 14 14 16   Creatinine 0.57 - 1.00 mg/dL 0.54(L) 0.66 0.73  Sodium 134 - 144 mmol/L 139 136 134(L)  Potassium 3.5 - 5.2 mmol/L 3.9 4.3 3.9  Chloride 96 - 106 mmol/L 103 100 100  CO2 20 - 29 mmol/L 20 20 22   Calcium 8.7 - 10.2 mg/dL 8.2(L) 8.7 7.8(L)  Total Protein 6.0 - 8.5 g/dL - 7.2 -  Total Bilirubin 0.0 - 1.2 mg/dL - 0.4 -  Alkaline Phos 44 - 121 IU/L - 96 -  AST 0 - 40 IU/L - 22 -  ALT 0 - 32 IU/L - 29 -   Lipid Panel     Component Value Date/Time   CHOL 274 (H) 05/19/2020 0926   TRIG 110 05/19/2020 0926   HDL 60 05/19/2020 0926   CHOLHDL 4.6 (H) 05/19/2020 0926   CHOLHDL 2.7 11/23/2014 1554   VLDL 11 11/23/2014 1554   LDLCALC 195 (H) 05/19/2020 0926    CBC    Component Value Date/Time   WBC 7.3 05/19/2020 0926   WBC 13.9 (H) 12/19/2018 0812   RBC 4.56 05/19/2020 0926   RBC 3.55 (L) 12/19/2018 0812   HGB 14.5 05/19/2020 0926   HCT 42.4 05/19/2020 0926   PLT 288 05/19/2020 0926   MCV 93 05/19/2020 0926   MCH 31.8 05/19/2020 0926   MCH 28.2 12/19/2018 0812   MCHC 34.2 05/19/2020 0926   MCHC 30.6 12/19/2018 0812   RDW 12.2 05/19/2020 0926   LYMPHSABS 1.5 05/19/2020 0926  MONOABS 0.5 11/23/2014 1554   EOSABS 0.2 05/19/2020 0926   BASOSABS 0.1 05/19/2020 0926    ASSESSMENT AND PLAN: 1. Essential hypertension Diastolic blood pressure not  at goal.  She will continue Diovan and Norvasc 5 mg.  I have sent the prescription for the 5 mg tablets to her pharmacy. - amLODipine (NORVASC) 5 MG tablet; Take 1 tablet (5 mg total) by mouth daily.  Dispense: 90 tablet; Refill: 1  2. Moderate major depression ([place]) 3. Stressful life event affecting family We discussed ways to help reduce stress.  Encouraged her to take at least 1 hour out of each day to pamper herself or do something just for her.  We discussed things like taking long baths, listening to music that she likes, meditation etc.  Discussed referring her to behavioral health to be taught some stress management techniques.  She is agreeable to this. She will continue the Zoloft. - Ambulatory referral to Psychiatry  4. Migraine without aura and without status migrainosus, not intractable History suggests migraine headaches plus medication overuse headaches.  Advised to stop Tylenol and all other over-the-counter medications that she may have been taking for headaches.  We discussed management of migraines.  We will use propranolol for prophylaxis which will also help with her blood pressure.  Ideally I would like to use Imitrex for abortive therapy but we will have to hold off on that until blood pressure is well controlled.  In the meantime I told her we will prescribe Fioricet for her to use as needed.  Advised that it is a controlled substance and as such can become habit-forming.  Advised that she take it only when needed and at the onset of the headache.  I told her that I give her a limited amount each month to prevent daily use she expressed understanding. -CT scan of the head ordered given the character of the headache - CT HEAD WO CONTRAST (5MM); Future - propranolol (INDERAL) 10 MG tablet; Take 1 tablet (10 mg total) by mouth 2 (two) times daily.  Dispense: 60 tablet; Refill: 3 - butalbital-acetaminophen-caffeine (FIORICET) 50-325-40 MG tablet; Take 1-2 tablets by mouth daily as  needed for headache (each prescription to last 1 mth).  Dispense: 15 tablet; Refill: 1  5. Cough, persistent 6. Postnasal drip Cough seems to be due to postnasal drip.  We discussed trying her with Flonase and Claritin.  Patient is agreeable. - fluticasone (FLONASE) 50 MCG/ACT nasal spray; Place 2 sprays into both nostrils daily.  Dispense: 16 g; Refill: 1 - loratadine (CLARITIN) 10 MG tablet; Take 1 tablet (10 mg total) by mouth at bedtime.  Dispense: 30 tablet; Refill: 2    Patient was given the opportunity to ask questions.  Patient verbalized understanding of the plan and was able to repeat key elements of the plan.   Orders Placed This Encounter  Procedures   CT HEAD WO CONTRAST (5MM)   Ambulatory referral to Psychiatry     Requested Prescriptions   Signed Prescriptions Disp Refills   amLODipine (NORVASC) 5 MG tablet 90 tablet 1    Sig: Take 1 tablet (5 mg total) by mouth daily.   fluticasone (FLONASE) 50 MCG/ACT nasal spray 16 g 1    Sig: Place 2 sprays into both nostrils daily.   loratadine (CLARITIN) 10 MG tablet 30 tablet 2    Sig: Take 1 tablet (10 mg total) by mouth at bedtime.   propranolol (INDERAL) 10 MG tablet 60 tablet 3    Sig:  Take 1 tablet (10 mg total) by mouth 2 (two) times daily.   butalbital-acetaminophen-caffeine (FIORICET) 50-325-40 MG tablet 15 tablet 1    Sig: Take 1-2 tablets by mouth daily as needed for headache (each prescription to last 1 mth).    Return in about 6 weeks (around 03/30/2021).  Karle Plumber, MD, FACP

## 2021-02-20 ENCOUNTER — Telehealth: Payer: Self-pay

## 2021-02-20 ENCOUNTER — Telehealth: Payer: Self-pay | Admitting: Internal Medicine

## 2021-02-20 NOTE — Telephone Encounter (Signed)
Pt doesn't have any insurance. Would you be able to follow up with pt please.

## 2021-02-20 NOTE — Telephone Encounter (Signed)
Copied from [place] [id number]. Topic: General - Other >> Feb 20, 2021  1:05 PM Leward Quan A wrote: Reason for CRM: Patient called in wanted Dr Wynetta Emery to know that she will still need the appointment with a Psychiatrist say that at this point its really important that she get this appointment scheduled. Please call patient at Ph# [phone]

## 2021-02-20 NOTE — Telephone Encounter (Signed)
Contacted pt to go over appointment information.   CT is scheduled for February 24, 2021 at 10:30am at [facility]. Pt will need to arrive at 10:15am.   Pt didn't answer and was unable to lvm due to vm being full

## 2021-02-22 ENCOUNTER — Ambulatory Visit
Admission: EM | Admit: 2021-02-22 | Discharge: 2021-02-22 | Disposition: A | Discharge status: Home / Self Care | Payer: 59 | Attending: Urgent Care | Admitting: Urgent Care

## 2021-02-22 ENCOUNTER — Other Ambulatory Visit: Payer: Self-pay

## 2021-02-22 ENCOUNTER — Ambulatory Visit (INDEPENDENT_AMBULATORY_CARE_PROVIDER_SITE_OTHER): Payer: 59

## 2021-02-22 DIAGNOSIS — M25562 Pain in left knee: Secondary | ICD-10-CM

## 2021-02-22 DIAGNOSIS — Z86718 Personal history of other venous thrombosis and embolism: Secondary | ICD-10-CM | POA: Diagnosis not present

## 2021-02-22 MED ORDER — PREDNISONE 10 MG PO TABS
30.0000 mg | ORAL_TABLET | Freq: Every day | ORAL | 0 refills | Status: DC
Start: 2021-02-22 — End: 2021-04-05

## 2021-02-22 NOTE — ED Provider Notes (Signed)
[place]   MRN: [id number] DOB: November 15, 1970  Subjective:   RATEEL BELDIN is a 51 y.o. female presenting for 1 day history of acute onset persistent and worsening severe left knee pain with decreased range of motion.  Symptoms started after she was working for an extended period of time kneeling.  There was no particular trauma but since then has noticed that she has had severe pain of the left knee with swelling.   Current Facility-Administered Medications:    triamcinolone acetonide (KENALOG) 10 MG/ML injection 10 mg, 10 mg, Other, Once, Landis Martins, DPM  Current Outpatient Medications:    ALPRAZolam (XANAX) 0.5 MG tablet, Take 0.5-1 tablets (0.25-0.5 mg total) by mouth daily as needed for anxiety., Disp: 15 tablet, Rfl: 0   amLODipine (NORVASC) 5 MG tablet, Take 1 tablet (5 mg total) by mouth daily., Disp: 90 tablet, Rfl: 1   brompheniramine-pseudoephedrine-DM 30-2-10 MG/5ML syrup, Take 5 mLs by mouth 4 (four) times daily as needed. (Patient not taking: Reported on 02/16/2021), Disp: 240 mL, Rfl: 0   butalbital-acetaminophen-caffeine (FIORICET) 50-325-40 MG tablet, Take 1-2 tablets by mouth daily as needed for headache (each prescription to last 1 mth)., Disp: 15 tablet, Rfl: 1   fluticasone (FLONASE) 50 MCG/ACT nasal spray, Place 2 sprays into both nostrils daily., Disp: 16 g, Rfl: 1   levothyroxine (SYNTHROID, LEVOTHROID) 125 MCG tablet, Take 125 mcg by mouth daily before breakfast. , Disp: , Rfl:    loratadine (CLARITIN) 10 MG tablet, Take 1 tablet (10 mg total) by mouth at bedtime., Disp: 30 tablet, Rfl: 2   omeprazole (PRILOSEC) 20 MG capsule, TAKE 1 CAPSULE(20 MG) BY MOUTH DAILY, Disp: 90 capsule, Rfl: 0   pravastatin (PRAVACHOL) 10 MG tablet, TAKE 1 TABLET BY MOUTH EVERY MONDAY, WEDNESDAY AND FRIDAY, Disp: 12 tablet, Rfl: 2   pregabalin (LYRICA) 75 MG capsule, Take 1 capsule (75 mg total) by mouth 2 (two) times daily., Disp: 60 capsule, Rfl: 1   propranolol  (INDERAL) 10 MG tablet, Take 1 tablet (10 mg total) by mouth 2 (two) times daily., Disp: 60 tablet, Rfl: 3   sertraline (ZOLOFT) 100 MG tablet, TAKE 1 TABLET(100 MG) BY MOUTH DAILY, Disp: 90 tablet, Rfl: 0   valsartan (DIOVAN) 40 MG tablet, TAKE 1 TABLET(40 MG) BY MOUTH DAILY, Disp: 90 tablet, Rfl: 0   Allergies  Allergen Reactions   Other     BLOOD REFUSAL    Sulfa Antibiotics Other (See Comments) and Nausea And Vomiting    Severe bladder, and kidney infection   Trazodone And Nefazodone Other (See Comments)    Headache every am     Past Medical History:  Diagnosis Date   Anemia    Anxiety    panic attack, also reports problems with depression, relative to her job & upcoming surgery , relative to life problems - with children & spouse     Back complaints 2013   has had steroid injection    Depression    DVT (deep venous thrombosis) ([place]) 2001   RLE   Fibroids    Foot fracture, left    HTN (hypertension)    Migraine    h/o migraines - as a teenager  & again now with family issues, states she has to lay down & rest in a dark room  (08/28/2013)   Muscle strain of hand    wearing home made splint today, L hand   Peripheral vascular disease ([facility])    DVT- per pt. in 2001- had  vein removed.    Refusal of blood transfusions as patient is Jehovah's Witness    Thyroid cancer ([place])    Thyroid nodule      Past Surgical History:  Procedure Laterality Date   CYSTOSCOPY N/A 12/18/2018   Procedure: CYSTOSCOPY;  Surgeon: Jonelle Sidle, MD;  Location: [facility];  Service: Gynecology;  Laterality: N/A;   LAPAROSCOPIC LYSIS OF ADHESIONS N/A 12/18/2018   Procedure: LAPAROSCOPIC LYSIS OF ADHESIONS;  Surgeon: Jonelle Sidle, MD;  Location: [facility];  Service: Gynecology;  Laterality: N/A;   ROBOTIC ASSISTED LAPAROSCOPIC HYSTERECTOMY AND SALPINGECTOMY Bilateral 12/18/2018   Procedure: XI ROBOTIC ASSISTED LAPAROSCOPIC HYSTERECTOMY AND BILATERAL  SALPINGECTOMY;  Surgeon: Jonelle Sidle, MD;  Location: [facility];  Service: Gynecology;  Laterality: Bilateral;   THROMBECTOMY Right 2001   leg; after 3rd child was born; for deep vein thrombosis   THYROIDECTOMY Right 08/28/2013   Procedure: RIGHT THYROID LOBECTOMY POSSIBLE TOTAL THYROIDECTOMY;  Surgeon: Jodi Marble, MD;  Location: [facility];  Service: ENT;  Laterality: Right;   THYROIDECTOMY Left 09/10/2013   Procedure: COMPLETE LEFT THYROIDECTOMY;  Surgeon: Jodi Marble, MD;  Location: [facility] OR;  Service: ENT;  Laterality: Left;   TUBAL LIGATION  2001    Family History  Problem Relation Age of Onset   Heart disease Mother    Hyperlipidemia Mother    Heart disease Father    Hyperlipidemia Father    Diabetes Father    Heart disease Brother    Hyperlipidemia Brother    Hypertension Brother    Heart disease Brother    Hyperlipidemia Brother     Social History   Tobacco Use   Smoking status: Never   Smokeless tobacco: Never  Vaping Use   Vaping Use: Never used  Substance Use Topics   Alcohol use: Yes    Comment: occ   Drug use: No    ROS   Objective:   Vitals: BP (!) 153/97 (BP Location: Left Arm)    Pulse 66    Temp 98.4 F (36.9 C) (Oral)    Resp 18    LMP 01/08/2019    SpO2 96%   Physical Exam Constitutional:      General: She is not in acute distress.    Appearance: Normal appearance. She is well-developed. She is not ill-appearing, toxic-appearing or diaphoretic.  HENT:     Head: Normocephalic and atraumatic.     Nose: Nose normal.     Mouth/Throat:     Mouth: Mucous membranes are moist.  Eyes:     General: No scleral icterus.       Right eye: No discharge.        Left eye: No discharge.     Extraocular Movements: Extraocular movements intact.  Cardiovascular:     Rate and Rhythm: Normal rate.  Pulmonary:     Effort: Pulmonary effort is normal.  Musculoskeletal:     Left knee: Bony tenderness present. No swelling, deformity,  effusion, erythema, ecchymosis, lacerations or crepitus. Decreased range of motion. Tenderness present over the medial joint line and patellar tendon. No lateral joint line tenderness. Normal alignment and normal patellar mobility.  Skin:    General: Skin is warm and dry.  Neurological:     General: No focal deficit present.     Mental Status: She is alert and oriented to person, place, and time.  Psychiatric:        Mood and Affect: Mood normal.  Behavior: Behavior normal.    DG Knee Complete 4 Views Left  Result Date: 02/22/2021 CLINICAL DATA:  Left knee pain and swelling EXAM: LEFT KNEE - COMPLETE 4+ VIEW COMPARISON:  None. FINDINGS: No evidence of fracture, dislocation, or joint effusion. No evidence of arthropathy or other focal bone abnormality. Soft tissues are unremarkable. IMPRESSION: Negative. Electronically Signed   By: Macy Mis M.D.   On: 02/22/2021 11:19     Assessment and Plan :   PDMP not reviewed this encounter.  1. Acute pain of left knee   2. History of DVT (deep vein thrombosis)     Suspect inflammatory knee pain related to her work activities from yesterday.  She has a history of a DVT, will avoid NSAIDs.  Recommended RICE method, will use an oral prednisone course.  Follow-up with [facility] for recheck. Counseled patient on potential for adverse effects with medications prescribed/recommended today, ER and return-to-clinic precautions discussed, patient verbalized understanding.    Jaynee Eagles, [name] 02/22/21 1127

## 2021-02-22 NOTE — ED Triage Notes (Signed)
Pt c/o lt knee pain after bending down on floor with knees on Tuesday at work. Denies any other know injury. States took arthritis OTC tabs yesterday with some relief.

## 2021-02-22 NOTE — Discharge Instructions (Signed)
Wear the Ace wrap over the next week.  Follow-up with [facility] for follow especially if your symptoms continue to bother you. Use prednisone for anti-inflammatory properties given your history of dvt.

## 2021-02-24 ENCOUNTER — Ambulatory Visit (HOSPITAL_COMMUNITY): Payer: 59

## 2021-03-03 ENCOUNTER — Ambulatory Visit: Payer: Self-pay | Admitting: *Deleted

## 2021-03-03 NOTE — Telephone Encounter (Signed)
Pt called in unsure of where she needs to go to fill out paperwork for this appt, pt requested a call back with more information.

## 2021-03-03 NOTE — Telephone Encounter (Signed)
°  Chief Complaint: migraine  Symptoms: pain from behind eyes to back of head Frequency: constant daily for 2 weeks Pertinent Negatives:  Disposition: [] ED /[x] Urgent Care (no appt availability in office) / [] Appointment(In office/virtual)/ []  [facility]/ [] Home Care/ [] Refused Recommended Disposition /[] [facility]/ []  Follow-up with PCP Additional Notes: Patient wants to know if provider will change migraine medication.

## 2021-03-03 NOTE — Telephone Encounter (Signed)
I spoke with pt. She still has insurance and has an upcoming psychiatry appt on 04/05/21 at 9:00am.

## 2021-03-03 NOTE — Telephone Encounter (Signed)
Reason for Disposition  [1] SEVERE headache (e.g., excruciating) AND [2] not improved after 2 hours of pain medicine  Answer Assessment - Initial Assessment Questions 1. LOCATION: "Where does it hurt?"      behind eye- to top of head to back of head 2. ONSET: "When did the headache start?" (Minutes, hours or days)      2 weeks ago- constant 3. PATTERN: "Does the pain come and go, or has it been constant since it started?"     Constant- patient is sleeping 4. SEVERITY: "How bad is the pain?" and "What does it keep you from doing?"  (e.g., Scale 1-10; mild, moderate, or severe)   - MILD (1-3): doesn't interfere with normal activities    - MODERATE (4-7): interferes with normal activities or awakens from sleep    - SEVERE (8-10): excruciating pain, unable to do any normal activities        severe 5. RECURRENT SYMPTOM: "Have you ever had headaches before?" If Yes, ask: "When was the last time?" and "What happened that time?"      Yes- as child- migraines 6. CAUSE: "What do you think is causing the headache?"     Possible stress 7. MIGRAINE: "Have you been diagnosed with migraine headaches?" If Yes, ask: "Is this headache similar?"      yes 8. HEAD INJURY: "Has there been any recent injury to the head?"      No 9. OTHER SYMPTOMS: "Do you have any other symptoms?" (fever, stiff neck, eye pain, sore throat, cold symptoms)     Eye pain 10. PREGNANCY: "Is there any chance you are pregnant?" "When was your last menstrual period?"  Protocols used: [facility]

## 2021-03-06 ENCOUNTER — Telehealth: Payer: Self-pay | Admitting: Orthopedic Surgery

## 2021-03-06 ENCOUNTER — Other Ambulatory Visit: Payer: Self-pay | Admitting: Physical Medicine and Rehabilitation

## 2021-03-06 DIAGNOSIS — M5416 Radiculopathy, lumbar region: Secondary | ICD-10-CM

## 2021-03-06 MED ORDER — TRAMADOL HCL 50 MG PO TABS: 50.0000 mg | ORAL_TABLET | Freq: Three times a day (TID) | ORAL | 0 refills | Status: DC | PRN

## 2021-03-06 MED ORDER — SUMATRIPTAN SUCCINATE 50 MG PO TABS: ORAL_TABLET | ORAL | 1 refills | Status: DC

## 2021-03-06 NOTE — Telephone Encounter (Signed)
Kara Hall states that she called the MD on call yesterday due to extreme pain, but she never received a return call.  She is concerned that her sciatica has gotten worse or she has a different problem. C/O hip pain that radiates down in to the back of her leg.  She states she usually has injections with Dr. Ernestina Patches. ? What can be done to help her. 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  Her call back # is [phone].

## 2021-03-06 NOTE — Telephone Encounter (Signed)
Will forward to provider  

## 2021-03-06 NOTE — Addendum Note (Signed)
Addended by: Karle Plumber B on: 03/06/2021 03:28 PM   Modules accepted: Orders

## 2021-03-06 NOTE — Telephone Encounter (Signed)
Tried contacting pt didn't answer and was unable to lvm due to vm being full

## 2021-03-06 NOTE — Progress Notes (Signed)
Spoke with patient via telephone this morning. Patient states left sided buttock pain radiating down leg, describes as same pain that she experienced before. Order placed for repeat left L5 transforaminal injection, I also prescribed a short course of Tramadol.

## 2021-03-15 NOTE — Telephone Encounter (Signed)
I spoke with pt and she mentioned that she completed her paperwork for this appt.

## 2021-03-18 ENCOUNTER — Other Ambulatory Visit: Payer: Self-pay | Admitting: Internal Medicine

## 2021-03-18 DIAGNOSIS — I10 Essential (primary) hypertension: Secondary | ICD-10-CM

## 2021-03-20 NOTE — Telephone Encounter (Signed)
Requesting early. ?Requested Prescriptions  ?Pending Prescriptions Disp Refills  ?? valsartan (DIOVAN) 40 MG tablet [Pharmacy Med Name: VALSARTAN '40MG'$  TABLETS] 90 tablet 0  ?  Sig: TAKE 1 TABLET(40 MG) BY MOUTH DAILY  ?  ? Cardiovascular:  Angiotensin Receptor Blockers Failed - 03/18/2021  6:19 AM  ?  ?  Failed - Cr in normal range and within 180 days  ?  Creat  ?Date Value Ref Range Status  ?11/23/2014 0.48 (L) 0.50 - 1.10 mg/dL Final  ? ?Creatinine, Ser  ?Date Value Ref Range Status  ?08/31/2020 0.54 (L) 0.57 - 1.00 mg/dL Final  ?   ?  ?  Failed - K in normal range and within 180 days  ?  Potassium  ?Date Value Ref Range Status  ?08/31/2020 3.9 3.5 - 5.2 mmol/L Final  ?   ?  ?  Failed - Last BP in normal range  ?  BP Readings from Last 1 Encounters:  ?02/22/21 (!) 153/97  ?   ?  ?  Passed - Patient is not pregnant  ?  ?  Passed - Valid encounter within last 6 months  ?  Recent Outpatient Visits   ?      ? 1 month ago Essential hypertension  ? [place] Karle Plumber B, MD  ? 3 months ago Strep throat  ? [place] W, NP  ? 5 months ago Essential hypertension  ? [place], RPH-CPP  ? 6 months ago Essential hypertension  ? [place], RPH-CPP  ? 7 months ago Essential hypertension  ? [place] Ladell Pier, MD  ?  ?  ? ?  ?  ?  ? ? ?

## 2021-03-22 ENCOUNTER — Other Ambulatory Visit: Payer: Self-pay | Admitting: Physical Medicine and Rehabilitation

## 2021-03-22 ENCOUNTER — Telehealth: Payer: Self-pay | Admitting: Physical Medicine and Rehabilitation

## 2021-03-22 MED ORDER — TRAMADOL HCL 50 MG PO TABS: 50.0000 mg | ORAL_TABLET | Freq: Three times a day (TID) | ORAL | 0 refills | Status: DC | PRN

## 2021-03-22 NOTE — Telephone Encounter (Signed)
Pt called requesting a call back. Pt is asking for a new prescription for tramadol. Pt states she is given a little at a time and her pharmacy informed her to call her Ortho doctor for a higher quantity. Pt is set and an injection in a week or so and need meds for pain. Please cal pt about this matter at  [phone]. ?

## 2021-04-03 ENCOUNTER — Ambulatory Visit: Payer: 59 | Admitting: Physical Medicine and Rehabilitation

## 2021-04-03 ENCOUNTER — Other Ambulatory Visit: Payer: Self-pay

## 2021-04-03 ENCOUNTER — Encounter: Payer: Self-pay | Admitting: Physical Medicine and Rehabilitation

## 2021-04-03 ENCOUNTER — Ambulatory Visit: Payer: Self-pay

## 2021-04-03 VITALS — BP 119/84 | HR 74

## 2021-04-03 DIAGNOSIS — M79662 Pain in left lower leg: Secondary | ICD-10-CM | POA: Diagnosis not present

## 2021-04-03 DIAGNOSIS — M5416 Radiculopathy, lumbar region: Secondary | ICD-10-CM | POA: Diagnosis not present

## 2021-04-03 DIAGNOSIS — M47816 Spondylosis without myelopathy or radiculopathy, lumbar region: Secondary | ICD-10-CM

## 2021-04-03 DIAGNOSIS — I83812 Varicose veins of left lower extremities with pain: Secondary | ICD-10-CM

## 2021-04-03 MED ORDER — PREGABALIN 75 MG PO CAPS: 75.0000 mg | ORAL_CAPSULE | Freq: Two times a day (BID) | ORAL | 1 refills | Status: DC

## 2021-04-03 MED ORDER — METHYLPREDNISOLONE ACETATE 80 MG/ML IJ SUSP
80.0000 mg | Freq: Once | INTRAMUSCULAR | Status: AC
  Administered 2021-04-03: 80 mg

## 2021-04-03 NOTE — Patient Instructions (Signed)

## 2021-04-03 NOTE — Progress Notes (Signed)
Pt state lower back pain that travels down her left leg to the foot. Pt state walking, standing and sitting makes the pain worse. Pt state she takes pain meds, heating and ice to help ease her pain.  Numeric Pain Rating Scale and Functional Assessment Average Pain 8   In the last MONTH (on 0-10 scale) has pain interfered with the following?  1. General activity like being  able to carry out your everyday physical activities such as walking, climbing stairs, carrying groceries, or moving a chair?  Rating(10)   +Driver, -BT, -Dye Allergies.  

## 2021-04-03 NOTE — Progress Notes (Signed)
? ?Kara Hall - 51 y.o. female MRN [id number]  Date of birth: Jan 29, 1970 ? ?Office Visit Note: ?Visit Date: 04/03/2021 ?PCP: Ladell Pier, MD ?Referred by: Ladell Pier, MD ? ?Subjective: ?Chief Complaint  ?Patient presents with  ? Lower Back - Pain  ? Left Leg - Pain  ? Left Foot - Pain  ? ?HPI:  Kara Hall is a 51 y.o. female who comes in today for what was thought to be essentially a repeat left L5 transforaminal epidural steroid injection.  The last time I saw the patient was in December and we completed an L5 transforaminal injection for which she said helped for about 3 weeks pretty significantly and then the symptoms returned abruptly.  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  Prior injections at least on 1 occasion an L5 transforaminal injection gave her significant relief.  It did last a few months.  S1 transforaminal injection did not help as much.  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  She has no symptoms on the right.  Symptoms worse with standing ambulating prolonged sitting.  She does have some arthritic change and at least stress reaction on MRI.  MRI reviewed again below.  MRI is fairly current from 2022.  She does not really endorse paresthesia.  She initially 2 years ago did have a course of physical therapy but none recently.  She reports not being good about any home exercises.  She has had medication management with tramadol and Vicodin and she feels like the Vicodin helps more.  We had a discussion today about the use of opioids chronically which is not something we endorse in our office and are unable to manage.  We have given her recent prescription for tramadol.  She was taking Lyrica but reports to me today that the prescription ran out and that she had called and we did not give her a refill.  I  did discuss with her that there are no notes concerning not wanting to give her a refill of Lyrica and that I would be happy to do that today.  She is somewhat tearful today overall and does seem stressed. ? ?Secondary issue with her is that she does have varicose veins on the left calf.  She is concerned that these veins are causing some of the calf pain.  She reports her calf pain is just as severe as the low back and buttock pain.  She has not had any evaluation of the lower leg in terms of vascular surgery options.  I did discuss with her that I felt like the varicose veins were probably not the cause of the symptoms but there can be a phlebitis.  There is no redness around the area.  Veins seem to be fairly superficial. ? ?Review of Systems  ?Musculoskeletal:  Positive for back pain and joint pain.  ?     Left calf pain  ?All other systems reviewed and are negative. Otherwise per HPI. ? ?Assessment & Plan: ?Visit Diagnoses:  ?  ICD-10-CM   ?1. Lumbar radiculopathy  M54.16 XR C-ARM NO REPORT  ?  Epidural Steroid injection  ?  methylPREDNISolone acetate (DEPO-MEDROL) injection 80 mg  ?  ?2. Spondylosis without myelopathy or radiculopathy, lumbar region  M47.816   ?  ?3. Pain of left calf  M79.662  Ambulatory referral to Vascular Surgery  ?  ?4. Varicose veins of left lower extremity with pain  I83.812 Ambulatory referral to Vascular Surgery  ?  ?  ?Plan: Findings:  ?1.  Chronic worsening severe left L5 radicular pain and radiculopathy.  Consistent with MRI findings and at least diagnostically has done well with transforaminal epidural steroid injection.  I am going to repeat this 1 time as we had scheduled this just to see if she gets good relief.  She did get diagnostic relief and December and prior to that had almost 3 months of relief.  We talked about restarting the Lyrica which I did today.  We talked about opioid management.  I also discussed surgical microdiscectomy.  I will have her follow-up in a few  weeks. ? ?2.  Varicose veins and left calf pain.  I believe this is still related to the radiculopathy.  She is concerned about the varicose veins and the potential for this to be causing symptoms in the leg.  She has not really noted any swelling or induration or rash etc.  She does feel a burning sensation right over the area of the varicose veins.  We will place a referral to [facility] vein and vascular for evaluation.  ? ?Meds & Orders:  ?Meds ordered this encounter  ?Medications  ? methylPREDNISolone acetate (DEPO-MEDROL) injection 80 mg  ? pregabalin (LYRICA) 75 MG capsule  ?  Sig: Take 1 capsule (75 mg total) by mouth 2 (two) times daily.  ?  Dispense:  60 capsule  ?  Refill:  1  ?  ?Orders Placed This Encounter  ?Procedures  ? XR C-ARM NO REPORT  ? Ambulatory referral to Vascular Surgery  ? Epidural Steroid injection  ?  ?Follow-up: Return in about 6 weeks (around 05/15/2021).  ? ?Procedures: ?No procedures performed  ?Lumbosacral Transforaminal Epidural Steroid Injection - Sub-Pedicular Approach with Fluoroscopic Guidance ? ?Patient: Kara Hall      ?Date of Birth: 1970-04-30 ?MRN: [id number] ?PCP: Ladell Pier, MD      ?Visit Date: 04/03/2021 ?  ?Universal Protocol:    ?Date/Time: 04/03/2021 ? ?Consent Given By: the patient ? ?Position: PRONE ? ?Additional Comments: ?Vital signs were monitored before and after the procedure. ?Patient was prepped and draped in the usual sterile fashion. ?The correct patient, procedure, and site was verified. ? ? ?Injection Procedure Details:  ? ?Procedure diagnoses: Lumbar radiculopathy [M54.16]   ? ?Meds Administered:  ?Meds ordered this encounter  ?Medications  ? methylPREDNISolone acetate (DEPO-MEDROL) injection 80 mg  ? pregabalin (LYRICA) 75 MG capsule  ?  Sig: Take 1 capsule (75 mg total) by mouth 2 (two) times daily.  ?  Dispense:  60 capsule  ?  Refill:  1  ? ? ?Laterality: Left ? ?Location/Site: L5 ? ?Needle:5.0 in., 22 ga.  Short bevel or Quincke  spinal needle ? ?Needle Placement: Transforaminal ? ?Findings: ?  ? -Comments: Excellent flow of contrast along the nerve, nerve root and into the epidural space. ? ?Procedure Details: ?After squaring off the end-plates to get a true AP view, the C-arm was positioned so that an oblique view of the foramen as noted above was visualized. The target area is just inferior to the "nose of the scotty dog" or sub pedicular. The soft tissues overlying this structure were infiltrated with 2-3 ml. of 1% Lidocaine without Epinephrine. ? ?The spinal needle was inserted toward the target using a "trajectory" view along the fluoroscope beam.  Under AP and  lateral visualization, the needle was advanced so it did not puncture dura and was located close the 6 O'Clock position of the pedical in AP tracterory. Biplanar projections were used to confirm position. Aspiration was confirmed to be negative for CSF and/or blood. A 1-2 ml. volume of Isovue-250 was injected and flow of contrast was noted at each level. Radiographs were obtained for documentation purposes.  ? ?After attaining the desired flow of contrast documented above, a 0.5 to 1.0 ml test dose of 0.25% Marcaine was injected into each respective transforaminal space.  The patient was observed for 90 seconds post injection.  After no sensory deficits were reported, and normal lower extremity motor function was noted,   the above injectate was administered so that equal amounts of the injectate were placed at each foramen (level) into the transforaminal epidural space. ? ? ?Additional Comments:  ?The patient tolerated the procedure well ?Dressing: 2 x 2 sterile gauze and Band-Aid ?  ? ?Post-procedure details: ?Patient was observed during the procedure. ?Post-procedure instructions were reviewed. ? ?Patient left the clinic in stable condition. ?   ? ?Clinical History: ?MRI lumbar spine:  ? ?INDICATION: Low back pain which radiates down the left leg.  ? ?TECHNIQUE: Sagittal and  axial T1 and T2-weighted sequences were performed. Additional sagittal STIR images were performed.  ? ?COMPARISON: None available  ? ?FINDINGS:  ?#  Osseous structures: No compression fracture. There is marrow

## 2021-04-04 NOTE — Procedures (Signed)
Lumbosacral Transforaminal Epidural Steroid Injection - Sub-Pedicular Approach with Fluoroscopic Guidance ? ?Patient: Kara Hall      ?Date of Birth: 07/14/1970 ?MRN: [id number] ?PCP: Ladell Pier, MD      ?Visit Date: 04/03/2021 ?  ?Universal Protocol:    ?Date/Time: 04/03/2021 ? ?Consent Given By: the patient ? ?Position: PRONE ? ?Additional Comments: ?Vital signs were monitored before and after the procedure. ?Patient was prepped and draped in the usual sterile fashion. ?The correct patient, procedure, and site was verified. ? ? ?Injection Procedure Details:  ? ?Procedure diagnoses: Lumbar radiculopathy [M54.16]   ? ?Meds Administered:  ?Meds ordered this encounter  ?Medications  ? methylPREDNISolone acetate (DEPO-MEDROL) injection 80 mg  ? pregabalin (LYRICA) 75 MG capsule  ?  Sig: Take 1 capsule (75 mg total) by mouth 2 (two) times daily.  ?  Dispense:  60 capsule  ?  Refill:  1  ? ? ?Laterality: Left ? ?Location/Site: L5 ? ?Needle:5.0 in., 22 ga.  Short bevel or Quincke spinal needle ? ?Needle Placement: Transforaminal ? ?Findings: ?  ? -Comments: Excellent flow of contrast along the nerve, nerve root and into the epidural space. ? ?Procedure Details: ?After squaring off the end-plates to get a true AP view, the C-arm was positioned so that an oblique view of the foramen as noted above was visualized. The target area is just inferior to the "nose of the scotty dog" or sub pedicular. The soft tissues overlying this structure were infiltrated with 2-3 ml. of 1% Lidocaine without Epinephrine. ? ?The spinal needle was inserted toward the target using a "trajectory" view along the fluoroscope beam.  Under AP and lateral visualization, the needle was advanced so it did not puncture dura and was located close the 6 O'Clock position of the pedical in AP tracterory. Biplanar projections were used to confirm position. Aspiration was confirmed to be negative for CSF and/or blood. A 1-2 ml. volume of  Isovue-250 was injected and flow of contrast was noted at each level. Radiographs were obtained for documentation purposes.  ? ?After attaining the desired flow of contrast documented above, a 0.5 to 1.0 ml test dose of 0.25% Marcaine was injected into each respective transforaminal space.  The patient was observed for 90 seconds post injection.  After no sensory deficits were reported, and normal lower extremity motor function was noted,   the above injectate was administered so that equal amounts of the injectate were placed at each foramen (level) into the transforaminal epidural space. ? ? ?Additional Comments:  ?The patient tolerated the procedure well ?Dressing: 2 x 2 sterile gauze and Band-Aid ?  ? ?Post-procedure details: ?Patient was observed during the procedure. ?Post-procedure instructions were reviewed. ? ?Patient left the clinic in stable condition. ?  ?

## 2021-04-05 ENCOUNTER — Telehealth (HOSPITAL_BASED_OUTPATIENT_CLINIC_OR_DEPARTMENT_OTHER): Payer: 59 | Admitting: Psychiatry

## 2021-04-05 ENCOUNTER — Other Ambulatory Visit: Payer: Self-pay

## 2021-04-05 ENCOUNTER — Encounter (HOSPITAL_COMMUNITY): Payer: Self-pay | Admitting: Psychiatry

## 2021-04-05 VITALS — Wt 197.0 lb

## 2021-04-05 DIAGNOSIS — F419 Anxiety disorder, unspecified: Secondary | ICD-10-CM

## 2021-04-05 DIAGNOSIS — F331 Major depressive disorder, recurrent, moderate: Secondary | ICD-10-CM | POA: Diagnosis not present

## 2021-04-05 MED ORDER — SERTRALINE HCL 100 MG PO TABS: ORAL_TABLET | ORAL | 0 refills | Status: DC

## 2021-04-05 MED ORDER — ARIPIPRAZOLE 2 MG PO TABS: 2.0000 mg | ORAL_TABLET | Freq: Every day | ORAL | 0 refills | Status: DC
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
## 2021-04-11 ENCOUNTER — Telehealth (HOSPITAL_COMMUNITY): Payer: Self-pay | Admitting: *Deleted

## 2021-04-11 NOTE — Telephone Encounter (Signed)
She can try Rexulti 0.5 mg instead of Abilify. If agree than please call her pharmacy. ?

## 2021-04-11 NOTE — Telephone Encounter (Signed)
Received t/c from pt who states that since she began the Abilify 2 mg, 04/05/21, she has been having extreme headaches. Pt says that she has been diagnosed with migraines and medication making it worse, hard to work. Pt next appointment scheduled for 04/27/21. Please review and advise. ?

## 2021-04-12 ENCOUNTER — Other Ambulatory Visit (HOSPITAL_COMMUNITY): Payer: Self-pay | Admitting: *Deleted

## 2021-04-12 ENCOUNTER — Telehealth (HOSPITAL_COMMUNITY): Payer: Self-pay | Admitting: *Deleted

## 2021-04-12 MED ORDER — REXULTI 0.5 MG PO TABS: 0.5000 mg | ORAL_TABLET | Freq: Every day | ORAL | 0 refills | Status: DC

## 2021-04-12 NOTE — Telephone Encounter (Signed)
Spoke with pt regarding medication change r/t side effects (headaches) from Abilify.This nurse discussed discontinuing the Abilify and starting Rexulri 0.5 mg daily. Pt verbalizes understanding. Pt to pick up samples, #14, this week. Pt will call us to update and wether we need to send an order for #30 to her pharmacy.  ?

## 2021-04-19 ENCOUNTER — Other Ambulatory Visit: Payer: Self-pay | Admitting: Internal Medicine

## 2021-04-19 NOTE — Telephone Encounter (Signed)
Medication Refill: butalbital-acetaminophen-caffeine (FIORICET) 50-325-40 MG tablet ? ? ?Pt called and stated that she would like this medication refilled because she is having lots of headaches at work due to getting her medication changed. She states that she would like this refilled one more time until her meed are right. Please advise.  ? ? ?[name] - [place], [address]  ?[place], [place] [address]  ?Phone:  [phone]  Fax:  [phone]  ?

## 2021-04-20 NOTE — Telephone Encounter (Signed)
Requested medication (s) are due for refill today: yes ? ?Requested medication (s) are on the active medication list: yes ? ?Last refill:  03/15/21 ? ?Future visit scheduled: no ? ?Notes to clinic:  Unable to refill per protocol, cannot delegate.  ? ? ?  ?Requested Prescriptions  ?Pending Prescriptions Disp Refills  ? butalbital-acetaminophen-caffeine (FIORICET) 50-325-40 MG tablet 14 tablet   ?  Sig: Take 1-2 tablets by mouth daily as needed.  ?  ? Not Delegated - Analgesics:  Non-Opioid Analgesic Combinations 2 Failed - 04/19/2021 12:28 PM  ?  ?  Failed - This refill cannot be delegated  ?  ?  Failed - Cr in normal range and within 360 days  ?  Creat  ?Date Value Ref Range Status  ?11/23/2014 0.48 (L) 0.50 - 1.10 mg/dL Final  ? ?Creatinine, Ser  ?Date Value Ref Range Status  ?08/31/2020 0.54 (L) 0.57 - 1.00 mg/dL Final  ?  ?  ?  ?  Passed - eGFR is 10 or above and within 360 days  ?  GFR calc Af Amer  ?Date Value Ref Range Status  ?12/19/2018 >60 >60 mL/min Final  ? ?GFR calc non Af Amer  ?Date Value Ref Range Status  ?12/19/2018 >60 >60 mL/min Final  ? ?eGFR  ?Date Value Ref Range Status  ?08/31/2020 113 >59 mL/min/1.73 Final  ?  ?  ?  ?  Passed - Patient is not pregnant  ?  ?  Passed - Valid encounter within last 12 months  ?  Recent Outpatient Visits   ? ?      ? 2 months ago Essential hypertension  ? [place] Karle Plumber B, MD  ? 4 months ago Strep throat  ? [place], [place] W, NP  ? 6 months ago Essential hypertension  ? [place], RPH-CPP  ? 7 months ago Essential hypertension  ? [place], RPH-CPP  ? 8 months ago Essential hypertension  ? [place] Ladell Pier, MD  ? ?  ?  ? ?  ?  ?  ? ? ?

## 2021-04-22 MED ORDER — BUTALBITAL-APAP-CAFFEINE 50-325-40 MG PO TABS: 1.0000 | ORAL_TABLET | Freq: Every day | ORAL | 0 refills | Status: DC | PRN

## 2021-04-23 ENCOUNTER — Other Ambulatory Visit: Payer: Self-pay | Admitting: Physician Assistant

## 2021-04-23 DIAGNOSIS — I10 Essential (primary) hypertension: Secondary | ICD-10-CM

## 2021-04-24 ENCOUNTER — Other Ambulatory Visit: Payer: Self-pay | Admitting: Internal Medicine

## 2021-04-24 DIAGNOSIS — E782 Mixed hyperlipidemia: Secondary | ICD-10-CM

## 2021-04-24 NOTE — Telephone Encounter (Signed)
Patient will need an office visit for further refills. ?Requested Prescriptions  ?Pending Prescriptions Disp Refills  ?? pravastatin (PRAVACHOL) 10 MG tablet [Pharmacy Med Name: PRAVASTATIN '10MG'$  TABLETS] 12 tablet 2  ?  Sig: TAKE 1 TABLET BY MOUTH EVERY MONDAY, WEDNESDAY, FRIDAY  ?  ? Cardiovascular:  Antilipid - Statins Failed - 04/24/2021  3:42 AM  ?  ?  Failed - Lipid Panel in normal range within the last 12 months  ?  Cholesterol, Total  ?Date Value Ref Range Status  ?05/19/2020 274 (H) 100 - 199 mg/dL Final  ? ?LDL Chol Calc (NIH)  ?Date Value Ref Range Status  ?05/19/2020 195 (H) 0 - 99 mg/dL Final  ? ?HDL  ?Date Value Ref Range Status  ?05/19/2020 60 >39 mg/dL Final  ? ?Triglycerides  ?Date Value Ref Range Status  ?05/19/2020 110 0 - 149 mg/dL Final  ? ?  ?  ?  Passed - Patient is not pregnant  ?  ?  Passed - Valid encounter within last 12 months  ?  Recent Outpatient Visits   ?      ? 2 months ago Essential hypertension  ? [place] Karle Plumber B, MD  ? 4 months ago Strep throat  ? [place], [place] W, NP  ? 6 months ago Essential hypertension  ? [place], RPH-CPP  ? 7 months ago Essential hypertension  ? [place], RPH-CPP  ? 8 months ago Essential hypertension  ? [place] Ladell Pier, MD  ?  ?  ? ?  ?  ?  ? ?

## 2021-04-25 ENCOUNTER — Other Ambulatory Visit: Payer: Self-pay | Admitting: Internal Medicine

## 2021-04-25 DIAGNOSIS — R0982 Postnasal drip: Secondary | ICD-10-CM

## 2021-04-25 NOTE — Telephone Encounter (Signed)
Medication Refill - Medication: fluticasone (FLONASE) 50 MCG/ACT nasal spray ? ?Has the patient contacted their pharmacy? Yes.   ? ?(Agent: If yes, when and what did the pharmacy advise?) Patient unable to connect with Pharmacy  ? ?Preferred Pharmacy (with phone number or street name):  ?[facility] #[id number] - [place], [address]  ?[place], [place] [address]  ?Phone:  [phone]  Fax:  [phone]  ? ? ?Has the patient been seen for an appointment in the last year OR does the patient have an upcoming appointment? Yes.   ? ?Agent: Please be advised that RX refills may take up to 3 business days. We ask that you follow-up with your pharmacy. ?

## 2021-04-25 NOTE — Telephone Encounter (Signed)
Requested medication (s) are due for refill today: yes ? ?Requested medication (s) are on the active medication list: yes ? ?Last refill:  02/16/21 16g with 1 RF ? ?Future visit scheduled: no ? ?Notes to clinic:  This medication can not be delegated, please assess.  ? ? ? ?  ? ?Requested Prescriptions  ?Pending Prescriptions Disp Refills  ? fluticasone (FLONASE) 50 MCG/ACT nasal spray 16 g 1  ?  Sig: Place 2 sprays into both nostrils daily.  ?  ? Not Delegated - Ear, Nose, and Throat: Nasal Preparations - Corticosteroids Failed - 04/25/2021  8:47 AM  ?  ?  Failed - This refill cannot be delegated  ?  ?  Passed - Valid encounter within last 12 months  ?  Recent Outpatient Visits   ? ?      ? 2 months ago Essential hypertension  ? [place] Karle Plumber B, MD  ? 4 months ago Strep throat  ? [place], [place] W, NP  ? 6 months ago Essential hypertension  ? [place], RPH-CPP  ? 7 months ago Essential hypertension  ? [place], RPH-CPP  ? 8 months ago Essential hypertension  ? [place] Ladell Pier, MD  ? ?  ?  ? ?  ?  ?  ? ? ?

## 2021-04-26 MED ORDER — FLUTICASONE PROPIONATE 50 MCG/ACT NA SUSP: 2.0000 | Freq: Every day | NASAL | 1 refills | Status: DC

## 2021-04-27 ENCOUNTER — Telehealth (HOSPITAL_BASED_OUTPATIENT_CLINIC_OR_DEPARTMENT_OTHER): Payer: 59 | Admitting: Psychiatry

## 2021-04-27 ENCOUNTER — Encounter (HOSPITAL_COMMUNITY): Payer: Self-pay | Admitting: Psychiatry

## 2021-04-27 VITALS — Wt 197.0 lb

## 2021-04-27 DIAGNOSIS — F419 Anxiety disorder, unspecified: Secondary | ICD-10-CM

## 2021-04-27 DIAGNOSIS — F331 Major depressive disorder, recurrent, moderate: Secondary | ICD-10-CM

## 2021-04-27 MED ORDER — BREXPIPRAZOLE 1 MG PO TABS: 1.0000 mg | ORAL_TABLET | Freq: Every day | ORAL | 1 refills | Status: DC

## 2021-04-27 MED ORDER — ALPRAZOLAM 0.5 MG PO TABS: 0.2500 mg | ORAL_TABLET | Freq: Every day | ORAL | 0 refills | Status: DC | PRN

## 2021-04-27 NOTE — Progress Notes (Signed)
Virtual Visit via Video Note ? ?I connected with Kara Hall on 04/27/21 at  8:20 AM EDT by a video enabled telemedicine application and verified that I am speaking with the correct person using two identifiers. ? ?Location: ?Patient: Home ?Provider: Home Office. ?  ?I discussed the limitations of evaluation and management by telemedicine and the availability of in person appointments. The patient expressed understanding and agreed to proceed. ? ?History of Present Illness: ?Patient is evaluated by video session.  She is a 51 year old female who was seen first time 3 weeks ago who presented with the symptoms of crying, depression, labile mood, irritability and severe anxiety.  She was referred from her primary care doctor.  We started her on Abilify however after a few days she called back reporting that having headaches from Abilify.  She was given REXULTI 0.5 mg samples.  She is doing better.  She denies any recent crying spells and she noticed it helps to calm her down but is still there are moments when she gets very tense and anxious.  She is not engaged in any impulsive decision or excessive spending.  She sleeps 6 hours but usually from 9 PM to 3 PM.  She continued to reported a lot of stress from job, taking care of elderly mother and husband sometimes does not understand.  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  She reported mother sometime very demanding and sometimes she is not sure what she will say but she has to help her.  On the Easter weekend her kids came and that was good.  She denies any suicidal thoughts.  She denies any paranoia, hallucination.  She is taking Zoloft 100 mg which was initially prescribed by PCP but now she like to keep from our office.  She reported no tremors, shakes, headaches from [place].  She is wondering if dose can be increased further.  She also like to get Xanax which was given by her PCP however new PCP did not like to renew.   She usually takes Xanax 1 to 2 tablet in 1 month.  Last prescription was given in May 2022.  She admitted there are times when she has to take half a tablet if she know her job will be very difficult that day.  Her appetite is okay.  Her weight is stable.  She takes thyroid medicine.  She denies any suicidal thoughts or any feeling of hopelessness.  She denies drinking or using any illegal substances.  She lives with her husband.  She has 3 children from her first marriage. ? ?Past Psychiatric History: ?Tried lexapro from psychiatrist made her hyper.  Allergic to Trazodone and nefazodone.  Headache from Abilify. H/O impulsive buying and excessive spending. No history of suicidal attempt, psychosis and inpatient treatment. ?  ?Psychiatric Specialty Exam: ?Physical Exam  ?Review of Systems  ?Weight 197 lb (89.4 kg), last menstrual period 01/08/2019.There is no height or weight on file to calculate BMI.  ?General Appearance: Casual  ?Eye Contact:  Hall  ?Speech:  Normal Rate  ?Volume:  Normal  ?Mood:  Anxious and emotional  ?Affect:  Congruent  ?Thought Process:  Goal Directed  ?Orientation:  Full (Time, Place, and Person)  ?Thought Content:  Rumination  ?Suicidal Thoughts:  No  ?Homicidal Thoughts:  No  ?Memory:  Immediate;   Good ?Recent;   Good ?Remote;   Good  ?Judgement:  Intact  ?Insight:  Hall  ?Psychomotor Activity:  Normal  ?  Concentration:  Concentration: Hall and Attention Span: Hall  ?Recall:  Good  ?Fund of Knowledge:  Good  ?Language:  Good  ?Akathisia:  No  ?Handed:  Right  ?AIMS (if indicated):     ?Assets:  Communication Skills ?Desire for Improvement ?Housing ?Social Support ?Talents/Skills ?Transportation  ?ADL's:  Intact  ?Cognition:  WNL  ?Sleep:   6 hrs  ? ? ? ? ?Assessment and Plan: ?Major depressive disorder, recurrent.  Anxiety. ? ?I reviewed current medication.  Patient liked REXULTI and do not have any side effects.  I recommend increased REXULTI 1 mg to help her residual symptoms.  She will  continue Zoloft 100 mg daily and we will restart Xanax 0.25 mg-5 mg as needed number 15 tablet will usually last for few months.  We talk about therapy but at this time patient like to see if medicine alone can help her symptoms.  Discussed safety concerns and anytime having active suicidal thoughts or homicidal thought that she need to call 911 or go to local emergency room.  Patient like to have this prescription sent to her [place].  Follow up in 6 weeks ? ?Follow Up Instructions: ? ?  ?I discussed the assessment and treatment plan with the patient. The patient was provided an opportunity to ask questions and all were answered. The patient agreed with the plan and demonstrated an understanding of the instructions. ?  ?The patient was advised to call back or seek an in-person evaluation if the symptoms worsen or if the condition fails to improve as anticipated. ?Collaboration of Care: Primary Care Provider AEB notes are available in epic to review ? ?Patient/Guardian was advised Release of Information must be obtained prior to any record release in order to collaborate their care with an outside provider. Patient/Guardian was advised if they have not already done so to contact the registration department to sign all necessary forms in order for [place] to release information regarding their care.  ? ?Consent: Patient/Guardian gives verbal consent for treatment and assignment of benefits for services provided during this visit. Patient/Guardian expressed understanding and agreed to proceed.   ? ?I provided 35 minutes of non-face-to-face time during this encounter. ? ? ?Kathlee Nations, MD  ?

## 2021-05-05 ENCOUNTER — Other Ambulatory Visit (HOSPITAL_COMMUNITY): Payer: Self-pay | Admitting: Psychiatry

## 2021-05-05 DIAGNOSIS — F419 Anxiety disorder, unspecified: Secondary | ICD-10-CM

## 2021-05-05 DIAGNOSIS — F331 Major depressive disorder, recurrent, moderate: Secondary | ICD-10-CM

## 2021-05-17 ENCOUNTER — Other Ambulatory Visit: Payer: Self-pay | Admitting: Internal Medicine

## 2021-05-17 DIAGNOSIS — I10 Essential (primary) hypertension: Secondary | ICD-10-CM

## 2021-05-18 ENCOUNTER — Other Ambulatory Visit: Payer: Self-pay | Admitting: Internal Medicine

## 2021-05-18 DIAGNOSIS — R0982 Postnasal drip: Secondary | ICD-10-CM

## 2021-05-26 ENCOUNTER — Telehealth: Payer: Self-pay | Admitting: Physical Medicine and Rehabilitation

## 2021-05-26 ENCOUNTER — Other Ambulatory Visit: Payer: Self-pay | Admitting: Physician Assistant

## 2021-05-26 NOTE — Telephone Encounter (Signed)
Pt called requesting a refill of Tramadol. Please send to pharmacy on file. Pt states she is in severe pain and hardly can work. Please call pt about this matter at [phone]. ?

## 2021-05-29 ENCOUNTER — Other Ambulatory Visit: Payer: Self-pay | Admitting: Physical Medicine and Rehabilitation

## 2021-05-29 MED ORDER — TRAMADOL HCL 50 MG PO TABS: 50.0000 mg | ORAL_TABLET | Freq: Three times a day (TID) | ORAL | 0 refills | Status: DC | PRN

## 2021-06-08 ENCOUNTER — Other Ambulatory Visit: Payer: Self-pay | Admitting: Internal Medicine

## 2021-06-08 DIAGNOSIS — K219 Gastro-esophageal reflux disease without esophagitis: Secondary | ICD-10-CM

## 2021-06-20 ENCOUNTER — Telehealth (HOSPITAL_COMMUNITY): Payer: 59 | Admitting: Psychiatry

## 2021-06-21 ENCOUNTER — Other Ambulatory Visit: Payer: Self-pay | Admitting: Internal Medicine

## 2021-06-21 ENCOUNTER — Telehealth (HOSPITAL_BASED_OUTPATIENT_CLINIC_OR_DEPARTMENT_OTHER): Payer: 59 | Admitting: Psychiatry

## 2021-06-21 ENCOUNTER — Encounter (HOSPITAL_COMMUNITY): Payer: Self-pay | Admitting: Psychiatry

## 2021-06-21 VITALS — Wt 200.0 lb

## 2021-06-21 DIAGNOSIS — F419 Anxiety disorder, unspecified: Secondary | ICD-10-CM

## 2021-06-21 DIAGNOSIS — F331 Major depressive disorder, recurrent, moderate: Secondary | ICD-10-CM | POA: Diagnosis not present

## 2021-06-21 DIAGNOSIS — R0982 Postnasal drip: Secondary | ICD-10-CM

## 2021-06-21 MED ORDER — BREXPIPRAZOLE 1 MG PO TABS: 1.0000 mg | ORAL_TABLET | Freq: Every day | ORAL | 1 refills | Status: DC

## 2021-06-21 MED ORDER — BUPROPION HCL ER (XL) 150 MG PO TB24: 150.0000 mg | ORAL_TABLET | Freq: Every day | ORAL | 0 refills | Status: DC

## 2021-06-21 NOTE — Telephone Encounter (Signed)
Requested Prescriptions  Pending Prescriptions Disp Refills  . loratadine (CLARITIN) 10 MG tablet [Pharmacy Med Name: Loratadine 10 MG Oral Tablet] 30 tablet 0    Sig: TAKE 1 TABLET BY MOUTH AT BEDTIME     Ear, Nose, and Throat:  Antihistamines 2 Failed - 06/21/2021  7:54 AM      Failed - Cr in normal range and within 360 days    Creat  Date Value Ref Range Status  11/23/2014 0.48 (L) 0.50 - 1.10 mg/dL Final   Creatinine, Ser  Date Value Ref Range Status  08/31/2020 0.54 (L) 0.57 - 1.00 mg/dL Final         Passed - Valid encounter within last 12 months    Recent Outpatient Visits          4 months ago Essential hypertension   [place], MD   6 months ago Strep throat   [place], [name], NP   8 months ago Essential hypertension   [place], [name], RPH-CPP   9 months ago Essential hypertension   [place], [name], RPH-CPP   10 months ago Essential hypertension   [place], MD

## 2021-06-21 NOTE — Progress Notes (Signed)
Virtual Visit via Video Note  I connected with Thurmon Fair on 06/21/21 at  4:00 PM EDT by a video enabled telemedicine application and verified that I am speaking with the correct person using two identifiers.  Location: Patient: Home Provider: Home Office   I discussed the limitations of evaluation and management by telemedicine and the availability of in person appointments. The patient expressed understanding and agreed to proceed.  History of Present Illness: Patient is evaluated by video session.  She had missed last appointment.  Patient told she forgot that she had appointment.  She is taking REXULTI 1 mg which is helping her mood irritability and anger but she noticed still struggle with attention, concentration and forgetfulness.  She sometimes feels very embarrassed talking to the people because she loses her train of thoughts.  She has these symptoms for a long time but now she feels it is getting out of control.  She has no tremor or shakes or any EPS.  She had to stop the REXULTI for a few days because she has dental work and she was given tramadol and he does not want to mix the REXULTI tramadol.  She is taking Zoloft.  She gained a few pounds since the last visit and she is not happy about it.  She has taken 2 times Xanax to calm her down her anxiety.  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  She does not like when her boss does micromanagement but she feels her job is manageable.  She lives with her husband.  She has 3 children from her first marriage.  Recently her daughter saw a psychiatrist and she was given Lamictal.  Patient denies drinking or using any illegal substances.  Past Psychiatric History: Tried lexapro from psychiatrist made her hyper.  Allergic to Trazodone and nefazodone.  Headache from Abilify. H/O impulsive buying and excessive spending. No history of suicidal attempt, psychosis and inpatient treatment.      Psychiatric  Specialty Exam: Physical Exam  Review of Systems  Weight 200 lb (90.7 kg), last menstrual period 01/08/2019.There is no height or weight on file to calculate BMI.  General Appearance: Casual  Eye Contact:  Fair  Speech:  Clear and Coherent  Volume:  Normal  Mood:  Anxious  Affect:  Congruent  Thought Process:  Descriptions of Associations: Intact  Orientation:  Full (Time, Place, and Person)  Thought Content:  Rumination  Suicidal Thoughts:  No  Homicidal Thoughts:  No  Memory:  Immediate;   Good Recent;   Fair Remote;   Fair  Judgement:  Intact  Insight:  Present  Psychomotor Activity:  Normal  Concentration:  Concentration: Fair and Attention Span: Fair  Recall:  [employer] of Knowledge:  Fair  Language:  Fair  Akathisia:  No  Handed:  Right  AIMS (if indicated):     Assets:  Communication Skills Desire for Improvement Housing Social Support Transportation  ADL's:  Intact  Cognition:  WNL  Sleep:         Assessment and Plan: Major depressive disorder, recurrent.  Anxiety.  Patient liked REXULTI and she feels her depression is doing better but she like to address her chronic symptoms of forgetfulness, attention, focus.  She recalled her grades in the school were not good.  She had never tested for ADHD.  I recommend try Wellbutrin instead of Zoloft to see if that helps her residual symptoms of focus, attention, motivation  and may help to avoid further weight gain.  She agreed to give a try.  She has taken few times Xanax but does not need a new refill at this time.  I recommend try Wellbutrin XL 150 mg in the morning.  Continue REXULTI 1 mg daily.  She will hold the Zoloft for now.  Discussed medication side effects and benefits.  I recommend to call us back if she has any question or any worsening of symptoms.  Follow up in 4 weeks.  Follow Up Instructions:    I discussed the assessment and treatment plan with the patient. The patient was provided an opportunity to  ask questions and all were answered. The patient agreed with the plan and demonstrated an understanding of the instructions.   The patient was advised to call back or seek an in-person evaluation if the symptoms worsen or if the condition fails to improve as anticipated.  Collaboration of Care: Primary Care Provider AEB notes are available in epic  Patient/Guardian was advised Release of Information must be obtained prior to any record release in order to collaborate their care with an outside provider. Patient/Guardian was advised if they have not already done so to contact the registration department to sign all necessary forms in order for [place] to release information regarding their care.   Consent: Patient/Guardian gives verbal consent for treatment and assignment of benefits for services provided during this visit. Patient/Guardian expressed understanding and agreed to proceed.    I provided 25 minutes of non-face-to-face time during this encounter.   Kathlee Nations, MD

## 2021-07-04 ENCOUNTER — Telehealth: Payer: Self-pay | Admitting: Physical Medicine and Rehabilitation

## 2021-07-04 ENCOUNTER — Other Ambulatory Visit: Payer: Self-pay | Admitting: Physical Medicine and Rehabilitation

## 2021-07-04 MED ORDER — TRAMADOL HCL 50 MG PO TABS: 50.0000 mg | ORAL_TABLET | Freq: Three times a day (TID) | ORAL | 0 refills | Status: DC | PRN

## 2021-07-04 NOTE — Telephone Encounter (Signed)
Pt called requesting a call back to set an injection appt. Please call pt at [phone].

## 2021-07-12 ENCOUNTER — Other Ambulatory Visit (HOSPITAL_COMMUNITY): Payer: Self-pay | Admitting: Psychiatry

## 2021-07-12 ENCOUNTER — Other Ambulatory Visit: Payer: Self-pay | Admitting: Internal Medicine

## 2021-07-12 DIAGNOSIS — E782 Mixed hyperlipidemia: Secondary | ICD-10-CM

## 2021-07-12 DIAGNOSIS — R0982 Postnasal drip: Secondary | ICD-10-CM

## 2021-07-12 DIAGNOSIS — F419 Anxiety disorder, unspecified: Secondary | ICD-10-CM

## 2021-07-12 DIAGNOSIS — F331 Major depressive disorder, recurrent, moderate: Secondary | ICD-10-CM

## 2021-07-12 NOTE — Telephone Encounter (Signed)
Requested Prescriptions  Pending Prescriptions Disp Refills  . loratadine (CLARITIN) 10 MG tablet [Pharmacy Med Name: Loratadine 10 MG Oral Tablet] 90 tablet 0    Sig: TAKE 1 TABLET BY MOUTH AT BEDTIME     Ear, Nose, and Throat:  Antihistamines 2 Failed - 07/12/2021  5:51 AM      Failed - Cr in normal range and within 360 days    Creat  Date Value Ref Range Status  11/23/2014 0.48 (L) 0.50 - 1.10 mg/dL Final   Creatinine, Ser  Date Value Ref Range Status  08/31/2020 0.54 (L) 0.57 - 1.00 mg/dL Final         Passed - Valid encounter within last 12 months    Recent Outpatient Visits          4 months ago Essential hypertension   [place], MD   6 months ago Strep throat   [place], [name], NP   9 months ago Essential hypertension   [place], [name], RPH-CPP   10 months ago Essential hypertension   [place], [name], RPH-CPP   10 months ago Essential hypertension   [place], MD      Future Appointments            In 3 months Wynetta Emery, Dalbert Batman, MD [place]

## 2021-07-12 NOTE — Telephone Encounter (Signed)
Pt. Has appointment. Requested Prescriptions  Pending Prescriptions Disp Refills  . pravastatin (PRAVACHOL) 10 MG tablet [Pharmacy Med Name: PRAVASTATIN '10MG'$  TABLETS] 12 tablet 2    Sig: TAKE 1 TABLET BY MOUTH EVERY MONDAY, [place], AND FRIDAY.     Cardiovascular:  Antilipid - Statins Failed - 07/12/2021  5:48 AM      Failed - Lipid Panel in normal range within the last 12 months    Cholesterol, Total  Date Value Ref Range Status  05/19/2020 274 (H) 100 - 199 mg/dL Final   LDL Chol Calc (NIH)  Date Value Ref Range Status  05/19/2020 195 (H) 0 - 99 mg/dL Final   HDL  Date Value Ref Range Status  05/19/2020 60 >39 mg/dL Final   Triglycerides  Date Value Ref Range Status  05/19/2020 110 0 - 149 mg/dL Final         Passed - Patient is not pregnant      Passed - Valid encounter within last 12 months    Recent Outpatient Visits          4 months ago Essential hypertension   [place], MD   6 months ago Strep throat   [place], [name], NP   9 months ago Essential hypertension   [place], [name], RPH-CPP   10 months ago Essential hypertension   [place], [name], RPH-CPP   10 months ago Essential hypertension   [place], MD      Future Appointments            In 3 months Wynetta Emery, Dalbert Batman, MD [place]

## 2021-07-20 ENCOUNTER — Other Ambulatory Visit (HOSPITAL_COMMUNITY): Payer: Self-pay | Admitting: *Deleted

## 2021-07-20 DIAGNOSIS — F419 Anxiety disorder, unspecified: Secondary | ICD-10-CM

## 2021-07-20 DIAGNOSIS — F331 Major depressive disorder, recurrent, moderate: Secondary | ICD-10-CM

## 2021-07-20 MED ORDER — BUPROPION HCL ER (XL) 150 MG PO TB24: 150.0000 mg | ORAL_TABLET | Freq: Every day | ORAL | 0 refills | Status: DC

## 2021-07-21 ENCOUNTER — Other Ambulatory Visit: Payer: Self-pay | Admitting: Physical Medicine and Rehabilitation

## 2021-07-24 ENCOUNTER — Telehealth (HOSPITAL_COMMUNITY): Payer: Self-pay

## 2021-07-24 ENCOUNTER — Encounter (HOSPITAL_COMMUNITY): Payer: Self-pay

## 2021-07-24 NOTE — Telephone Encounter (Signed)
Medication management - Telephone call with patient to follow up on messages she left that she was in need of a new Rexulti order. Informed pt a new order was sent to her [facility] on 06/21/21 with a refill. Pt will follow up with them for the new order and reminded pt of scheduled virtual appt with Dr. Adele Schilder on 07/26/21 as pt will need a new Bupropion order at that time.

## 2021-07-24 NOTE — Telephone Encounter (Signed)
Opened encounter as a duplicate this date so opened in error.

## 2021-07-26 ENCOUNTER — Encounter (HOSPITAL_COMMUNITY): Payer: Self-pay | Admitting: Psychiatry

## 2021-07-26 ENCOUNTER — Telehealth (HOSPITAL_BASED_OUTPATIENT_CLINIC_OR_DEPARTMENT_OTHER): Payer: 59 | Admitting: Psychiatry

## 2021-07-26 VITALS — Wt 200.0 lb

## 2021-07-26 DIAGNOSIS — F419 Anxiety disorder, unspecified: Secondary | ICD-10-CM | POA: Diagnosis not present

## 2021-07-26 DIAGNOSIS — F331 Major depressive disorder, recurrent, moderate: Secondary | ICD-10-CM

## 2021-07-26 DIAGNOSIS — F410 Panic disorder [episodic paroxysmal anxiety] without agoraphobia: Secondary | ICD-10-CM | POA: Diagnosis not present

## 2021-07-26 MED ORDER — ALPRAZOLAM 0.5 MG PO TABS: 0.2500 mg | ORAL_TABLET | Freq: Every day | ORAL | 0 refills | Status: DC | PRN

## 2021-07-26 MED ORDER — BREXPIPRAZOLE 1 MG PO TABS: 1.0000 mg | ORAL_TABLET | Freq: Every day | ORAL | 1 refills | Status: DC

## 2021-07-26 MED ORDER — BUPROPION HCL ER (XL) 150 MG PO TB24: 150.0000 mg | ORAL_TABLET | Freq: Every day | ORAL | 1 refills | Status: DC

## 2021-07-26 NOTE — Progress Notes (Signed)
Virtual Visit via Telephone Note  I connected with Kara Hall on 07/26/21 at  3:40 PM EDT by telephone and verified that I am speaking with the correct person using two identifiers.  Location: Patient: In Car Provider: Home Office   I discussed the limitations, risks, security and privacy concerns of performing an evaluation and management service by telephone and the availability of in person appointments. I also discussed with the patient that there may be a patient responsible charge related to this service. The patient expressed understanding and agreed to proceed.   History of Present Illness: Patient is evaluated by phone session.  She is in the car with the kids and cannot do the video.  However she was able to do telephone call.  Patient reported increased anxiety and nervousness because mother is in rehab and she is being helping her and going to visit her a lot.  She admitted panic attacks and crying spells and needed a new prescription of Xanax.  Patient reported Xanax helps her a lot to control these panic attacks.  She also reported not taking the [place] for past few days because she has difficulty picking up from pharmacy.  She liked her medicine to be sent at [facility].  She reported a lot of stress from the job.  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  She reported sleep is okay but she has to wake up early in the morning to take her thyroid medicine and that bothers her husband.  Patient told that thyroid medicine need to take at 5:00 because she go to work 8:00 and she need to take meds in the stomach.  She is also concerned about her back pain.  She has herniated disc and sometimes that flares up.  Patient lives with her husband.  She has 3 children from her first marriage.  So far she is tolerating medication and she really liked the Wellbutrin that helps her focus, attention concentration.  She is not taking Zoloft.  Her  appetite is fair.  She denies drinking or using any illegal substances.   Past Psychiatric History: Tried lexapro from psychiatrist made her hyper.  Allergic to Trazodone and nefazodone.  Headache from Abilify. H/O impulsive buying and excessive spending. No history of suicidal attempt, psychosis and inpatient treatment.   Psychiatric Specialty Exam: Physical Exam  Review of Systems  Weight 200 lb (90.7 kg), last menstrual period 01/08/2019.There is no height or weight on file to calculate BMI.  General Appearance: Casual  Eye Contact:  NA  Speech:  Clear and Coherent  Volume:  Increased  Mood:  Anxious and Depressed  Affect:  NA  Thought Process:  Descriptions of Associations: Intact  Orientation:  Full (Time, Place, and Person)  Thought Content:  Rumination  Suicidal Thoughts:  No  Homicidal Thoughts:  No  Memory:  Immediate;   Good Recent;   Good Remote;   Good  Judgement:  Intact  Insight:  Present  Psychomotor Activity:  NA  Concentration:  Concentration: Good and Attention Span: Fair  Recall:  Good  Fund of Knowledge:  Good  Language:  Good  Akathisia:  No  Handed:  Right  AIMS (if indicated):     Assets:  Communication Skills Desire for Improvement Housing Social Support Talents/Skills Transportation  ADL's:  Intact  Cognition:  WNL  Sleep:   fair      Assessment and Plan: Major depressive disorder, recurrent.  Anxiety.  Panic attack.  Discussed consistency of the medication and compliance that helps symptoms.  She really liked the Wellbutrin.  Recommend to keep the Wellbutrin XL 150 mg in the morning, we will also continue REXULTI 1 mg but she like to have these prescription sent to the [facility] where she can get without problem.  We will also provide Xanax 15 tablet that she can take only as needed for severe panic attack.  We talk about therapy and she agreed to get appointment for counseling.  I will refer her to see a therapist.  Discussed medication side  effects and benefits.  Recommended to call us back if is any question of any concern.  Follow-up in 6 weeks.  Follow Up Instructions:    I discussed the assessment and treatment plan with the patient. The patient was provided an opportunity to ask questions and all were answered. The patient agreed with the plan and demonstrated an understanding of the instructions.   The patient was advised to call back or seek an in-person evaluation if the symptoms worsen or if the condition fails to improve as anticipated.  Collaboration of Care: Primary Care Provider AEB notes are available in epic to review.  Patient/Guardian was advised Release of Information must be obtained prior to any record release in order to collaborate their care with an outside provider. Patient/Guardian was advised if they have not already done so to contact the registration department to sign all necessary forms in order for [place] to release information regarding their care.   Consent: Patient/Guardian gives verbal consent for treatment and assignment of benefits for services provided during this visit. Patient/Guardian expressed understanding and agreed to proceed.    I provided 24 minutes of non-face-to-face time during this encounter.   Kathlee Nations, MD

## 2021-07-27 ENCOUNTER — Telehealth (HOSPITAL_COMMUNITY): Payer: 59 | Admitting: Psychiatry

## 2021-07-30 ENCOUNTER — Encounter: Payer: Self-pay | Admitting: Emergency Medicine

## 2021-07-30 ENCOUNTER — Ambulatory Visit
Admission: EM | Admit: 2021-07-30 | Discharge: 2021-07-30 | Disposition: A | Discharge status: Home / Self Care | Payer: 59 | Attending: Physician Assistant | Admitting: Physician Assistant

## 2021-07-30 DIAGNOSIS — J029 Acute pharyngitis, unspecified: Secondary | ICD-10-CM | POA: Diagnosis present

## 2021-07-30 DIAGNOSIS — J069 Acute upper respiratory infection, unspecified: Secondary | ICD-10-CM | POA: Diagnosis present

## 2021-07-30 LAB — POCT RAPID STREP A (OFFICE): Rapid Strep A Screen: NEGATIVE

## 2021-07-30 MED ORDER — AMOXICILLIN 500 MG PO CAPS: 500.0000 mg | ORAL_CAPSULE | Freq: Three times a day (TID) | ORAL | 0 refills | Status: DC

## 2021-07-30 MED ORDER — LIDOCAINE VISCOUS HCL 2 % MT SOLN: 15.0000 mL | Freq: Four times a day (QID) | OROMUCOSAL | 0 refills | Status: DC | PRN

## 2021-07-30 NOTE — ED Provider Notes (Signed)
[facility]    CSN: [id number] Arrival date & time: 07/30/21  0949      History   Chief Complaint Chief Complaint  Patient presents with   Cough   Sore Throat   Nasal Congestion    HPI Kara Hall is a 51 y.o. female.   Patient presents today with a 5-day history of URI symptoms.  Reports sore throat, cough, nasal congestion.  Denies any fever, chest pain, shortness of breath.  She does have some GI symptoms but reports that these are chronic in nature and unchanged from baseline.  She denies any known sick contacts.  She has had COVID in the past with last episode last year.  She has been taking Tylenol ibuprofen without improvement of symptoms.  She is eating and drinking despite symptoms.  She is having difficulty with daily activities.  She has a history of allergies but has been taking allergy medication as prescribed.  Denies history of asthma or COPD.  She does smoke occasionally.  Denies any recent antibiotic or steroid use.  She is very concerned about a secondary infection and so is requesting an antibiotic today.    Past Medical History:  Diagnosis Date   Anemia    Anxiety    panic attack, also reports problems with depression, relative to her job & upcoming surgery , relative to life problems - with children & spouse     Back complaints 2013   has had steroid injection    Depression    DVT (deep venous thrombosis) ([place]) 2001   RLE   Fibroids    Foot fracture, left    HTN (hypertension)    Migraine    h/o migraines - as a teenager  & again now with family issues, states she has to lay down & rest in a dark room  (08/28/2013)   Muscle strain of hand    wearing home made splint today, L hand   Peripheral vascular disease ([place])    DVT- per pt. in 2001- had vein removed.    Refusal of blood transfusions as patient is Jehovah's Witness    Thyroid cancer ([place])    Thyroid nodule     Patient Active Problem List   Diagnosis Date Noted   Mixed  hyperlipidemia 08/19/2020   Obesity (BMI 30.0-34.9) 08/19/2020   Stressful life event affecting family 08/19/2020   Plantar fasciitis, bilateral 08/19/2020   Status post hysterectomy 12/18/2018   Prolonged Q-T interval on ECG 11/24/2018   Essential hypertension 11/24/2018   Dyspareunia in female 03/24/2018   Increased body mass index 03/24/2018   Carpal tunnel syndrome on both sides 12/10/2017   Achilles tendon contracture, left 10/04/2016   Displaced fracture of fifth metatarsal bone of left foot with delayed healing 05/07/2016   Bilateral wrist pain 11/28/2015   History of thyroid cancer 08/09/2013   Anxiety 01/12/2013    Past Surgical History:  Procedure Laterality Date   CYSTOSCOPY N/A 12/18/2018   Procedure: CYSTOSCOPY;  Surgeon: Jonelle Sidle, MD;  Location: [facility];  Service: Gynecology;  Laterality: N/A;   LAPAROSCOPIC LYSIS OF ADHESIONS N/A 12/18/2018   Procedure: LAPAROSCOPIC LYSIS OF ADHESIONS;  Surgeon: Jonelle Sidle, MD;  Location: [place];  Service: Gynecology;  Laterality: N/A;   ROBOTIC ASSISTED LAPAROSCOPIC HYSTERECTOMY AND SALPINGECTOMY Bilateral 12/18/2018   Procedure: XI ROBOTIC ASSISTED LAPAROSCOPIC HYSTERECTOMY AND BILATERAL SALPINGECTOMY;  Surgeon: Jonelle Sidle, MD;  Location: [place];  Service: Gynecology;  Laterality: Bilateral;   THROMBECTOMY Right 2001   leg; after 3rd child was born; for deep vein thrombosis   THYROIDECTOMY Right 08/28/2013   Procedure: RIGHT THYROID LOBECTOMY POSSIBLE TOTAL THYROIDECTOMY;  Surgeon: Jodi Marble, MD;  Location: [facility];  Service: ENT;  Laterality: Right;   THYROIDECTOMY Left 09/10/2013   Procedure: COMPLETE LEFT THYROIDECTOMY;  Surgeon: Jodi Marble, MD;  Location: [place];  Service: ENT;  Laterality: Left;   TUBAL LIGATION  2001    OB History   No obstetric history on file.      Home Medications    Prior to Admission medications    Medication Sig Start Date End Date Taking? Authorizing Provider  amoxicillin (AMOXIL) 500 MG capsule Take 1 capsule (500 mg total) by mouth 3 (three) times daily. 08/02/21  Yes Anessia [place] K, PA-C  lidocaine (XYLOCAINE) 2 % solution Use as directed 15 mLs in the mouth or throat every 6 (six) hours as needed for mouth pain. Swish and spit 07/30/21  Yes Arman Loy K, PA-C  ALPRAZolam (XANAX) 0.5 MG tablet Take 0.5-1 tablets (0.25-0.5 mg total) by mouth daily as needed for anxiety. 07/26/21   Arfeen, Arlyce Harman, MD  amLODipine (NORVASC) 5 MG tablet Take 1 tablet (5 mg total) by mouth daily. 02/16/21   Ladell Pier, MD  brexpiprazole (REXULTI) 1 MG TABS tablet Take 1 tablet (1 mg total) by mouth daily. 07/26/21   Arfeen, Arlyce Harman, MD  buPROPion (WELLBUTRIN XL) 150 MG 24 hr tablet Take 1 tablet (150 mg total) by mouth daily. 07/26/21   Arfeen, Arlyce Harman, MD  butalbital-acetaminophen-caffeine (FIORICET) [phone] MG tablet Take 1-2 tablets by mouth daily as needed. 04/22/21   Ladell Pier, MD  fluticasone (FLONASE) 50 MCG/ACT nasal spray Place 2 sprays into both nostrils daily. 04/26/21   Ladell Pier, MD  levothyroxine (SYNTHROID, LEVOTHROID) 125 MCG tablet Take 125 mcg by mouth daily before breakfast.     [provider]  loratadine (CLARITIN) 10 MG tablet TAKE 1 TABLET BY MOUTH AT BEDTIME 07/12/21   Ladell Pier, MD  omeprazole (PRILOSEC) 20 MG capsule TAKE 1 CAPSULE(20 MG) BY MOUTH DAILY 06/08/21   Ladell Pier, MD  pravastatin (PRAVACHOL) 10 MG tablet TAKE 1 TABLET BY MOUTH EVERY MONDAY, WEDNESDAY, AND FRIDAY. 07/12/21   Ladell Pier, MD  pregabalin (LYRICA) 75 MG capsule Take 1 capsule by mouth twice daily 07/24/21   Lorine Bears, NP  propranolol (INDERAL) 10 MG tablet Take 1 tablet (10 mg total) by mouth 2 (two) times daily. 02/16/21   Ladell Pier, MD  sertraline (ZOLOFT) 100 MG tablet TAKE 1 TABLET(100 MG) BY MOUTH DAILY Patient not taking: Reported on 06/21/2021  04/05/21   Arfeen, Arlyce Harman, MD  SUMAtriptan (IMITREX) 50 MG tablet 1 tab at the start of headache.  May repeat in 2 hours if no relief.  Max 2 tabs per 24-hour. 03/06/21   Ladell Pier, MD  traMADol (ULTRAM) 50 MG tablet Take 1 tablet (50 mg total) by mouth every 8 (eight) hours as needed for moderate pain or severe pain. 07/04/21 07/04/22  Lorine Bears, NP  valsartan (DIOVAN) 40 MG tablet TAKE 1 TABLET(40 MG) BY MOUTH DAILY 05/17/21   Ladell Pier, MD    Family History Family History  Problem Relation Age of Onset   Heart disease Mother    Hyperlipidemia Mother    Heart disease Father    Hyperlipidemia Father    Diabetes Father  Heart disease Brother    Hyperlipidemia Brother    Hypertension Brother    Heart disease Brother    Hyperlipidemia Brother     Social History Social History   Tobacco Use   Smoking status: Never   Smokeless tobacco: Never  Vaping Use   Vaping Use: Never used  Substance Use Topics   Alcohol use: Yes    Comment: occ   Drug use: No     Allergies   Other, Sulfa antibiotics, and Trazodone and nefazodone   Review of Systems Review of Systems  Constitutional:  Positive for activity change, chills and fatigue. Negative for appetite change and fever.  HENT:  Positive for congestion and sore throat. Negative for sinus pressure and sneezing.   Respiratory:  Positive for cough. Negative for shortness of breath.   Cardiovascular:  Negative for chest pain.  Gastrointestinal:  Positive for diarrhea (Chronic) and nausea (Chronic). Negative for abdominal pain and vomiting.  Neurological:  Negative for dizziness, light-headedness and headaches.     Physical Exam Triage Vital Signs ED Triage Vitals  Enc Vitals Group     BP 07/30/21 1309 125/86     Pulse Rate 07/30/21 1309 65     Resp 07/30/21 1309 18     Temp 07/30/21 1309 97.8 F (36.6 C)     Temp src --      SpO2 07/30/21 1309 96 %     Weight --      Height --      Head  Circumference --      Peak Flow --      Pain Score 07/30/21 1308 9     Pain Loc --      Pain Edu? --      Excl. in [place]? --    No data found.  Updated Vital Signs BP 125/86   Pulse 65   Temp 97.8 F (36.6 C)   Resp 18   LMP 01/08/2019   SpO2 96%   Visual Acuity Right Eye Distance:   Left Eye Distance:   Bilateral Distance:    Right Eye Near:   Left Eye Near:    Bilateral Near:     Physical Exam Vitals reviewed.  Constitutional:      General: She is awake. She is not in acute distress.    Appearance: Normal appearance. She is well-developed. She is not ill-appearing.     Comments: Very pleasant female appears stated age in no acute distress sitting comfortably in exam room  HENT:     Head: Normocephalic and atraumatic.     Right Ear: Tympanic membrane, ear canal and external ear normal. Tympanic membrane is not erythematous or bulging.     Left Ear: Tympanic membrane, ear canal and external ear normal. Tympanic membrane is not erythematous or bulging.     Nose:     Right Sinus: No maxillary sinus tenderness or frontal sinus tenderness.     Left Sinus: No maxillary sinus tenderness or frontal sinus tenderness.     Mouth/Throat:     Pharynx: Uvula midline. Posterior oropharyngeal erythema present. No oropharyngeal exudate.     Tonsils: No tonsillar exudate or tonsillar abscesses.  Cardiovascular:     Rate and Rhythm: Normal rate and regular rhythm.     Heart sounds: Normal heart sounds, S1 normal and S2 normal. No murmur heard. Pulmonary:     Effort: Pulmonary effort is normal.     Breath sounds: Normal breath sounds. No wheezing, rhonchi or rales.  Comments: Clear to auscultation bilaterally Lymphadenopathy:     Head:     Right side of head: No submental, submandibular or tonsillar adenopathy.     Left side of head: No submental, submandibular or tonsillar adenopathy.     Cervical: No cervical adenopathy.  Psychiatric:        Behavior: Behavior is cooperative.       UC Treatments / Results  Labs (all labs ordered are listed, but only abnormal results are displayed) Labs Reviewed  CULTURE, GROUP A STREP [facility])  POCT RAPID STREP A (OFFICE)    EKG   Radiology No results found.  Procedures Procedures (including critical care time)  Medications Ordered in UC Medications - No data to display  Initial Impression / Assessment and Plan / UC Course  I have reviewed the triage vital signs and the nursing notes.  Pertinent labs & imaging results that were available during my care of the patient were reviewed by me and considered in my medical decision making (see chart for details).     Patient is well-appearing, afebrile, nontoxic, nontachycardic.  Rapid strep was negative in clinic today.  Throat culture is pending.  Discussed with and utility of COVID testing but given this would not return results prior to when she is outside the window of effectiveness for antiviral medication this was deferred.  We will use viscous lidocaine to help manage sore throat symptoms.  Recommended gargling warm salt water and alternate Tylenol and ibuprofen.  She can use over-the-counter medication for additional symptom relief.  Patient is very concerned about developing a secondary bacterial infection.  Discussed that I do not see any signs of infection that would warrant initiation of antibiotics today.  She did request that a medication be sent to pharmacy and so prescription for high-dose amoxicillin was sent to pharmacy with instruction not to pick this up for several days and only to use it if symptoms persist/worsen.  Patient expressed understanding.  Discussed that if she develops any worsening symptoms including high fever, chest pain, shortness of breath, nausea/vomiting interfere with oral intake, weakness she needs to be seen immediately.  Strict return precautions given.  Work excuse note provided.  Final Clinical Impressions(s) / UC Diagnoses   Final  diagnoses:  Upper respiratory tract infection, unspecified type  Sore throat     Discharge Instructions      I do not see any evidence of infection.  I have called in a prescription for amoxicillin that you can start in a few days if your symptoms persist.  Use viscous lidocaine for sore throat pain relief.  Gargle with warm salt water and use Tylenol and ibuprofen.  He should rest and drink plenty of fluid.  If your symptoms worsen in any way you need to be reevaluated.  Follow-up with your PCP next week.     ED Prescriptions     Medication Sig Dispense Auth. Provider   amoxicillin (AMOXIL) 500 MG capsule Take 1 capsule (500 mg total) by mouth 3 (three) times daily. 21 capsule Deepti Gunawan K, PA-C   lidocaine (XYLOCAINE) 2 % solution Use as directed 15 mLs in the mouth or throat every 6 (six) hours as needed for mouth pain. Swish and spit 100 mL Ellee Wawrzyniak K, PA-C      PDMP not reviewed this encounter.   Terrilee Croak, PA-C 07/30/21 1343

## 2021-07-30 NOTE — ED Triage Notes (Signed)
Pt is present today with c/o cough, sore throat, and nasal congestion. Pt sx started x5 days ago

## 2021-07-30 NOTE — Discharge Instructions (Signed)
I do not see any evidence of infection.  I have called in a prescription for amoxicillin that you can start in a few days if your symptoms persist.  Use viscous lidocaine for sore throat pain relief.  Gargle with warm salt water and use Tylenol and ibuprofen.  He should rest and drink plenty of fluid.  If your symptoms worsen in any way you need to be reevaluated.  Follow-up with your PCP next week.

## 2021-07-31 ENCOUNTER — Telehealth: Payer: Self-pay | Admitting: Physical Medicine and Rehabilitation

## 2021-07-31 MED ORDER — MAGIC MOUTHWASH: 10.0000 mL | Freq: Three times a day (TID) | ORAL | Status: AC | PRN

## 2021-08-02 LAB — CULTURE, GROUP A STREP (THRC)

## 2021-08-09 ENCOUNTER — Encounter: Payer: Self-pay | Admitting: Physical Medicine and Rehabilitation

## 2021-08-09 ENCOUNTER — Ambulatory Visit: Payer: 59 | Admitting: Physical Medicine and Rehabilitation

## 2021-08-09 ENCOUNTER — Ambulatory Visit: Payer: Self-pay

## 2021-08-09 DIAGNOSIS — M5416 Radiculopathy, lumbar region: Secondary | ICD-10-CM

## 2021-08-09 MED ORDER — METHYLPREDNISOLONE ACETATE 80 MG/ML IJ SUSP
80.0000 mg | Freq: Once | INTRAMUSCULAR | Status: AC
  Administered 2021-08-09: 80 mg

## 2021-08-09 MED ORDER — PREGABALIN 75 MG PO CAPS
75.0000 mg | ORAL_CAPSULE | Freq: Two times a day (BID) | ORAL | 0 refills | Status: DC
Start: 2021-08-09 — End: 2021-08-28

## 2021-08-09 NOTE — Progress Notes (Signed)
Pt state lower back pain that travels down her left leg to the foot. Pt state walking, standing and sitting makes the pain worse. Pt state she takes pain meds, heating and ice to help ease her pain.  Numeric Pain Rating Scale and Functional Assessment Average Pain 8   In the last MONTH (on 0-10 scale) has pain interfered with the following?  1. General activity like being  able to carry out your everyday physical activities such as walking, climbing stairs, carrying groceries, or moving a chair?  Rating(10)   +Driver, -BT, -Dye Allergies.

## 2021-08-14 ENCOUNTER — Other Ambulatory Visit: Payer: Self-pay | Admitting: Internal Medicine

## 2021-08-14 DIAGNOSIS — I10 Essential (primary) hypertension: Secondary | ICD-10-CM

## 2021-08-14 NOTE — Progress Notes (Signed)
Kara Hall - 51 y.o. female MRN [id number]  Date of birth: [birth year]  Office Visit Note: Visit Date: 08/09/2021 PCP: Ladell Pier, MD Referred by: Ladell Pier, MD  Subjective: Chief Complaint  Patient presents with   Lower Back - Pain   Left Leg - Pain   Left Foot - Pain   HPI:  Kara Hall is a 51 y.o. female who comes in today for planned repeat Left L5-S1  Lumbar Transforaminal epidural steroid injection with fluoroscopic guidance.  The patient has failed conservative care including home exercise, medications, time and activity modification.  This injection will be diagnostic and hopefully therapeutic.  Please see requesting physician notes for further details and justification. Patient received more than 50% pain relief from prior injection.  There was some confusion on her use of Lyrica.  I am happy to provide a refill for that and restarted that prescription today.  Referring: Dr. Meridee Score and Bevely Palmer Persons, PA-C   ROS Otherwise per HPI.  Assessment & Plan: Visit Diagnoses:    ICD-10-CM   1. Lumbar radiculopathy  M54.16 XR C-ARM NO REPORT    Epidural Steroid injection    methylPREDNISolone acetate (DEPO-MEDROL) injection 80 mg      Plan: No additional findings.   Meds & Orders:  Meds ordered this encounter  Medications   methylPREDNISolone acetate (DEPO-MEDROL) injection 80 mg   pregabalin (LYRICA) 75 MG capsule    Sig: Take 1 capsule (75 mg total) by mouth 2 (two) times daily.    Dispense:  60 capsule    Refill:  0    Orders Placed This Encounter  Procedures   XR C-ARM NO REPORT   Epidural Steroid injection    Follow-up: Return for visit to requesting provider as needed.   Procedures: No procedures performed  Lumbosacral Transforaminal Epidural Steroid Injection - Sub-Pedicular Approach with Fluoroscopic Guidance  Patient: Kara Hall      Date of Birth: September 15, 1970 MRN: [id number] PCP: Ladell Pier,  MD      Visit Date: 08/09/2021   Universal Protocol:    Date/Time: 08/09/2021  Consent Given By: the patient  Position: PRONE  Additional Comments: Vital signs were monitored before and after the procedure. Patient was prepped and draped in the usual sterile fashion. The correct patient, procedure, and site was verified.   Injection Procedure Details:   Procedure diagnoses: Lumbar radiculopathy [M54.16]    Meds Administered:  Meds ordered this encounter  Medications   methylPREDNISolone acetate (DEPO-MEDROL) injection 80 mg   pregabalin (LYRICA) 75 MG capsule    Sig: Take 1 capsule (75 mg total) by mouth 2 (two) times daily.    Dispense:  60 capsule    Refill:  0    Laterality: Left  Location/Site: L5  Needle:5.0 in., 22 ga.  Short bevel or Quincke spinal needle  Needle Placement: Transforaminal  Findings:    -Comments: Excellent flow of contrast along the nerve, nerve root and into the epidural space.  Procedure Details: After squaring off the end-plates to get a true AP view, the C-arm was positioned so that an oblique view of the foramen as noted above was visualized. The target area is just inferior to the "nose of the scotty dog" or sub pedicular. The soft tissues overlying this structure were infiltrated with 2-3 ml. of 1% Lidocaine without Epinephrine.  The spinal needle was inserted toward the target using a "trajectory" view along the fluoroscope beam.  Under AP and  lateral visualization, the needle was advanced so it did not puncture dura and was located close the 6 O'Clock position of the pedical in AP tracterory. Biplanar projections were used to confirm position. Aspiration was confirmed to be negative for CSF and/or blood. A 1-2 ml. volume of Isovue-250 was injected and flow of contrast was noted at each level. Radiographs were obtained for documentation purposes.   After attaining the desired flow of contrast documented above, a 0.5 to 1.0 ml test dose of  0.25% Marcaine was injected into each respective transforaminal space.  The patient was observed for 90 seconds post injection.  After no sensory deficits were reported, and normal lower extremity motor function was noted,   the above injectate was administered so that equal amounts of the injectate were placed at each foramen (level) into the transforaminal epidural space.   Additional Comments:  The patient tolerated the procedure well Dressing: 2 x 2 sterile gauze and Band-Aid    Post-procedure details: Patient was observed during the procedure. Post-procedure instructions were reviewed.  Patient left the clinic in stable condition.    Clinical History: MRI lumbar spine:   INDICATION: Low back pain which radiates down the left leg.   TECHNIQUE: Sagittal and axial T1 and T2-weighted sequences were performed. Additional sagittal STIR images were performed.   COMPARISON: None available   FINDINGS:  #  Osseous structures: No compression fracture. There is marrow edema in the left pedicle of L5 suggesting a stress reaction.  #  Alignment: Normal.  #  Modic changes: Mild type I Modic change in the endplates adjacent to the degenerated L4-5 disc.  #  Conus medullaris: Normal. Terminates at L1 with no evidence of tethering.  #  Lower thoracic segments: No significant abnormality.  #  Additional findings: Bilateral ovarian cystic lesions. The one on the left measures at least 3.5 cm.   #  L1-2: Unremarkable  #    #  L2-3: Unremarkable  #    #  L3-4: Mild facet joint arthritis. No spinal or foraminal stenosis.  #    #  L4-5: Central disc protrusion with impingement on the thecal sac. Moderate facet joint arthritis.  #    #  L5-S1: Mild degenerative disc disease. Mild facet joint arthritis on the right and moderate facet joint arthritis on the left.    IMPRESSION:  1.  Central disc protrusion L4-5 with impingement on the thecal sac.  2.  Facet joint arthritis lower lumbar spine.   3.  Marrow edema in the left pedicle of L5 suggesting a stress reaction.   Electronically Signed by: Ross Marcus on 06/29/2020 1:05 PM     Objective:  VS:  HT:    WT:   BMI:     BP:   HR: bpm  TEMP: ( )  RESP:  Physical Exam Vitals and nursing note reviewed.  Constitutional:      General: She is not in acute distress.    Appearance: Normal appearance. She is not ill-appearing.  HENT:     Head: Normocephalic and atraumatic.     Right Ear: External ear normal.     Left Ear: External ear normal.  Eyes:     Extraocular Movements: Extraocular movements intact.  Cardiovascular:     Rate and Rhythm: Normal rate.     Pulses: Normal pulses.  Pulmonary:     Effort: Pulmonary effort is normal. No respiratory distress.  Abdominal:     General: There is no distension.  Palpations: Abdomen is soft.  Musculoskeletal:        General: Tenderness present.     Cervical back: Neck supple.     Right lower leg: No edema.     Left lower leg: No edema.     Comments: Patient has good distal strength with no pain over the greater trochanters.  No clonus or focal weakness.  Skin:    Findings: No erythema, lesion or rash.  Neurological:     General: No focal deficit present.     Mental Status: She is alert and oriented to person, place, and time.     Sensory: No sensory deficit.     Motor: No weakness or abnormal muscle tone.     Coordination: Coordination normal.  Psychiatric:        Mood and Affect: Mood normal.        Behavior: Behavior normal.      Imaging: No results found.

## 2021-08-14 NOTE — Procedures (Signed)
Lumbosacral Transforaminal Epidural Steroid Injection - Sub-Pedicular Approach with Fluoroscopic Guidance  Patient: Kara Hall      Date of Birth: [birth year] MRN: [id number] PCP: Ladell Pier, MD      Visit Date: 08/09/2021   Universal Protocol:    Date/Time: 08/09/2021  Consent Given By: the patient  Position: PRONE  Additional Comments: Vital signs were monitored before and after the procedure. Patient was prepped and draped in the usual sterile fashion. The correct patient, procedure, and site was verified.   Injection Procedure Details:   Procedure diagnoses: Lumbar radiculopathy [M54.16]    Meds Administered:  Meds ordered this encounter  Medications   methylPREDNISolone acetate (DEPO-MEDROL) injection 80 mg   pregabalin (LYRICA) 75 MG capsule    Sig: Take 1 capsule (75 mg total) by mouth 2 (two) times daily.    Dispense:  60 capsule    Refill:  0    Laterality: Left  Location/Site: L5  Needle:5.0 in., 22 ga.  Short bevel or Quincke spinal needle  Needle Placement: Transforaminal  Findings:    -Comments: Excellent flow of contrast along the nerve, nerve root and into the epidural space.  Procedure Details: After squaring off the end-plates to get a true AP view, the C-arm was positioned so that an oblique view of the foramen as noted above was visualized. The target area is just inferior to the "nose of the scotty dog" or sub pedicular. The soft tissues overlying this structure were infiltrated with 2-3 ml. of 1% Lidocaine without Epinephrine.  The spinal needle was inserted toward the target using a "trajectory" view along the fluoroscope beam.  Under AP and lateral visualization, the needle was advanced so it did not puncture dura and was located close the 6 O'Clock position of the pedical in AP tracterory. Biplanar projections were used to confirm position. Aspiration was confirmed to be negative for CSF and/or blood. A 1-2 ml. volume of  Isovue-250 was injected and flow of contrast was noted at each level. Radiographs were obtained for documentation purposes.   After attaining the desired flow of contrast documented above, a 0.5 to 1.0 ml test dose of 0.25% Marcaine was injected into each respective transforaminal space.  The patient was observed for 90 seconds post injection.  After no sensory deficits were reported, and normal lower extremity motor function was noted,   the above injectate was administered so that equal amounts of the injectate were placed at each foramen (level) into the transforaminal epidural space.   Additional Comments:  The patient tolerated the procedure well Dressing: 2 x 2 sterile gauze and Band-Aid    Post-procedure details: Patient was observed during the procedure. Post-procedure instructions were reviewed.  Patient left the clinic in stable condition.

## 2021-08-15 NOTE — Telephone Encounter (Signed)
Requested Prescriptions  Pending Prescriptions Disp Refills  . valsartan (DIOVAN) 40 MG tablet [Pharmacy Med Name: VALSARTAN '40MG'$  TABLETS] 30 tablet 1    Sig: TAKE 1 TABLET(40 MG) BY MOUTH DAILY     Cardiovascular:  Angiotensin Receptor Blockers Failed - 08/14/2021  7:16 AM      Failed - Cr in normal range and within 180 days    Creat  Date Value Ref Range Status  11/23/2014 0.48 (L) 0.50 - 1.10 mg/dL Final   Creatinine, Ser  Date Value Ref Range Status  08/31/2020 0.54 (L) 0.57 - 1.00 mg/dL Final         Failed - K in normal range and within 180 days    Potassium  Date Value Ref Range Status  08/31/2020 3.9 3.5 - 5.2 mmol/L Final         Passed - Patient is not pregnant      Passed - Last BP in normal range    BP Readings from Last 1 Encounters:  07/30/21 125/86         Passed - Valid encounter within last 6 months    Recent Outpatient Visits          6 months ago Essential hypertension   [place], MD   7 months ago Strep throat   [place], [name], NP   10 months ago Essential hypertension   [place], [name], RPH-CPP   11 months ago Essential hypertension   [place], [name], RPH-CPP   12 months ago Essential hypertension   [place], MD      Future Appointments            In 4 weeks Lorine Bears, NP [place]   In 2 months Ladell Pier, MD [place]

## 2021-08-16 ENCOUNTER — Telehealth (HOSPITAL_COMMUNITY): Payer: Self-pay | Admitting: *Deleted

## 2021-08-16 ENCOUNTER — Other Ambulatory Visit (HOSPITAL_COMMUNITY): Payer: Self-pay | Admitting: *Deleted

## 2021-08-16 DIAGNOSIS — F331 Major depressive disorder, recurrent, moderate: Secondary | ICD-10-CM

## 2021-08-16 DIAGNOSIS — F419 Anxiety disorder, unspecified: Secondary | ICD-10-CM

## 2021-08-16 MED ORDER — BUPROPION HCL ER (XL) 300 MG PO TB24: 300.0000 mg | ORAL_TABLET | Freq: Every day | ORAL | 0 refills | Status: DC

## 2021-08-16 NOTE — Telephone Encounter (Signed)
She can try Wellbutrin 300.  If she agree then please call prescription to her pharmacy.

## 2021-08-16 NOTE — Telephone Encounter (Signed)
Pt agrees. Rx sent to [facility] on [address].

## 2021-08-16 NOTE — Telephone Encounter (Signed)
Pt left message stating that she doesn't think the Wellbutrin is strong enough due to increased anxiety. Writer attempted to call pt back however was sent to VM. Pt did state that she's taking care of mother that is just out of rehab and is disoriented and not doing well. Pt stated she "just has a lot going on". Pt has an appointment scheduled for 09/06/21. Please review and advise.

## 2021-08-28 ENCOUNTER — Telehealth: Payer: Self-pay | Admitting: Physical Medicine and Rehabilitation

## 2021-08-28 ENCOUNTER — Other Ambulatory Visit: Payer: Self-pay | Admitting: Physical Medicine and Rehabilitation

## 2021-08-28 MED ORDER — TRAMADOL HCL 50 MG PO TABS: 50.0000 mg | ORAL_TABLET | Freq: Three times a day (TID) | ORAL | 0 refills | Status: DC | PRN

## 2021-08-28 MED ORDER — PREGABALIN 75 MG PO CAPS: 75.0000 mg | ORAL_CAPSULE | Freq: Two times a day (BID) | ORAL | 0 refills | Status: DC

## 2021-08-28 NOTE — Telephone Encounter (Signed)
Patient called needing Rx refilled Lyrica and Tramadol. The number to contact patient is [phone]

## 2021-09-01 ENCOUNTER — Other Ambulatory Visit: Payer: Self-pay | Admitting: Internal Medicine

## 2021-09-02 ENCOUNTER — Ambulatory Visit (HOSPITAL_COMMUNITY)
Admission: EM | Admit: 2021-09-02 | Discharge: 2021-09-02 | Disposition: A | Discharge status: Home / Self Care | Payer: Commercial Managed Care - HMO | Attending: Internal Medicine | Admitting: Internal Medicine

## 2021-09-02 ENCOUNTER — Encounter (HOSPITAL_COMMUNITY): Payer: Self-pay

## 2021-09-02 DIAGNOSIS — B349 Viral infection, unspecified: Secondary | ICD-10-CM | POA: Diagnosis not present

## 2021-09-02 DIAGNOSIS — J029 Acute pharyngitis, unspecified: Secondary | ICD-10-CM

## 2021-09-02 LAB — POCT RAPID STREP A, ED / UC: Streptococcus, Group A Screen (Direct): NEGATIVE

## 2021-09-02 MED ORDER — LIDOCAINE VISCOUS HCL 2 % MT SOLN: 15.0000 mL | OROMUCOSAL | 0 refills | Status: DC | PRN

## 2021-09-02 NOTE — Discharge Instructions (Addendum)
Patient with physical exam findings, I believe that you are suffering from a viral illness.  I think it is important to test yourself for COVID-19 however due to the duration of your symptoms, you would no longer benefit from antiviral therapy.  Please read the enclosed information regarding quarantine and isolation with COVID-19.  Your strep test today is negative.  Streptococcal throat culture will be performed per our protocol.  The result of your throat culture will be posted to your MyChart once it is complete, this typically takes 3 to 5 days.   If your streptococcal throat culture is positive, you will be contacted by phone and antibiotics will be prescribed for you.     Please see the list below for recommended medications, dosages and frequencies to provide relief of your current symptoms:    Advil, Motrin (ibuprofen): This is a good anti-inflammatory medication which addresses aches, pains and inflammation of the upper airways that causes sinus and nasal congestion as well as in the lower airways which makes your cough feel tight and sometimes burn.  I recommend that you take between 400 to 600 mg every 6-8 hours as needed.      Tylenol (acetaminophen): This is a good fever reducer.  If your body temperature rises above 101.5 as measured with a thermometer, it is recommended that you take 1,000 mg every 8 hours until your temperature falls below 101.5, please not take more than 3,000 mg of acetaminophen either as a separate medication or as in ingredient in an over-the-counter cold/flu preparation within a 24-hour period.      Xylocaine (lidocaine): This is a numbing medication that can be swished for 15 seconds and swallowed.  You can use this every 3 hours while awake to relieve pain in your mouth and throat.  I have sent a prescription for this medication to your pharmacy.   Please follow-up within the next 5-7 days either with your primary care provider or urgent care if your symptoms do  not resolve.  If you do not have a primary care provider, we will assist you in finding one.   Thank you for visiting urgent care today.  We appreciate the opportunity to participate in your care.

## 2021-09-02 NOTE — ED Provider Notes (Signed)
[facility]    CSN: [id number] Arrival date & time: 09/02/21  7867    HISTORY   Chief Complaint  Patient presents with   Sore Throat    Pt has been having a sore throat x1wk, headache pt denies fever   HPI Kara Hall is a pleasant, 51 y.o. female who presents to urgent care today. Pt is here for a sore throat x1wk with a headache. Pt denies fever, aches, chills, nausea, vomiting, diarrhea.  Patient endorses fatigue.  Patient has normal vital signs on arrival.  Rapid strep test today is negative.  The history is provided by the patient.   Past Medical History:  Diagnosis Date   Anemia    Anxiety    panic attack, also reports problems with depression, relative to her job & upcoming surgery , relative to life problems - with children & spouse     Back complaints 2013   has had steroid injection    Depression    DVT (deep venous thrombosis) ([place]) 2001   RLE   Fibroids    Foot fracture, left    HTN (hypertension)    Migraine    h/o migraines - as a teenager  & again now with family issues, states she has to lay down & rest in a dark room  (08/28/2013)   Muscle strain of hand    wearing home made splint today, L hand   Peripheral vascular disease ([place])    DVT- per pt. in 2001- had vein removed.    Refusal of blood transfusions as patient is Jehovah's Witness    Thyroid cancer ([place])    Thyroid nodule    Patient Active Problem List   Diagnosis Date Noted   Mixed hyperlipidemia 08/19/2020   Obesity (BMI 30.0-34.9) 08/19/2020   Stressful life event affecting family 08/19/2020   Plantar fasciitis, bilateral 08/19/2020   Status post hysterectomy 12/18/2018   Prolonged Q-T interval on ECG 11/24/2018   Essential hypertension 11/24/2018   Dyspareunia in female 03/24/2018   Increased body mass index 03/24/2018   Carpal tunnel syndrome on both sides 12/10/2017   Achilles tendon contracture, left 10/04/2016   Displaced fracture of fifth metatarsal bone of  left foot with delayed healing 05/07/2016   Bilateral wrist pain 11/28/2015   History of thyroid cancer 08/09/2013   Anxiety 01/12/2013   Past Surgical History:  Procedure Laterality Date   CYSTOSCOPY N/A 12/18/2018   Procedure: CYSTOSCOPY;  Surgeon: Jonelle Sidle, MD;  Location: [facility];  Service: Gynecology;  Laterality: N/A;   LAPAROSCOPIC LYSIS OF ADHESIONS N/A 12/18/2018   Procedure: LAPAROSCOPIC LYSIS OF ADHESIONS;  Surgeon: Jonelle Sidle, MD;  Location: [facility];  Service: Gynecology;  Laterality: N/A;   ROBOTIC ASSISTED LAPAROSCOPIC HYSTERECTOMY AND SALPINGECTOMY Bilateral 12/18/2018   Procedure: XI ROBOTIC ASSISTED LAPAROSCOPIC HYSTERECTOMY AND BILATERAL SALPINGECTOMY;  Surgeon: Jonelle Sidle, MD;  Location: [facility];  Service: Gynecology;  Laterality: Bilateral;   THROMBECTOMY Right 2001   leg; after 3rd child was born; for deep vein thrombosis   THYROIDECTOMY Right 08/28/2013   Procedure: RIGHT THYROID LOBECTOMY POSSIBLE TOTAL THYROIDECTOMY;  Surgeon: Jodi Marble, MD;  Location: [facility];  Service: ENT;  Laterality: Right;   THYROIDECTOMY Left 09/10/2013   Procedure: COMPLETE LEFT THYROIDECTOMY;  Surgeon: Jodi Marble, MD;  Location: [facility];  Service: ENT;  Laterality: Left;   TUBAL LIGATION  2001   OB History   No obstetric history on file.  Home Medications    Prior to Admission medications   Medication Sig Start Date End Date Taking? Authorizing Provider  ALPRAZolam [name]) 0.5 MG tablet Take 0.5-1 tablets (0.25-0.5 mg total) by mouth daily as needed for anxiety. 07/26/21   Arfeen, Arlyce [place], MD  amLODipine (NORVASC) 5 MG tablet Take 1 tablet (5 mg total) by mouth daily. 02/16/21   Ladell Pier, MD  amoxicillin (AMOXIL) 500 MG capsule Take 1 capsule (500 mg total) by mouth 3 (three) times daily. 08/02/21   Raspet, Derry Skill, PA-C  brexpiprazole (REXULTI) 1 MG TABS tablet Take 1 tablet (1 mg  total) by mouth daily. 07/26/21   Arfeen, Arlyce [place], MD  buPROPion (WELLBUTRIN XL) 300 MG 24 hr tablet Take 1 tablet (300 mg total) by mouth daily. 08/16/21   Arfeen, Arlyce [place], MD  butalbital-acetaminophen-caffeine (FIORICET) [phone] MG tablet TAKE 1 TO 2 TABLETS BY MOUTH DAILY AS NEEDED 09/02/21   Ladell Pier, MD  fluticasone [facility]) 50 MCG/ACT nasal spray Place 2 sprays into both nostrils daily. 04/26/21   Ladell Pier, MD  levothyroxine (SYNTHROID, LEVOTHROID) 125 MCG tablet Take 125 mcg by mouth daily before breakfast.     [provider]  lidocaine (XYLOCAINE) 2 % solution Use as directed 15 mLs in the mouth or throat every 6 (six) hours as needed for mouth pain. Swish and spit 07/30/21   Raspet, Junie Panning K, PA-C  loratadine (CLARITIN) 10 MG tablet TAKE 1 TABLET BY MOUTH AT BEDTIME 07/12/21   Ladell Pier, MD  omeprazole (PRILOSEC) 20 MG capsule TAKE 1 CAPSULE(20 MG) BY MOUTH DAILY 06/08/21   Ladell Pier, MD  pravastatin (PRAVACHOL) 10 MG tablet TAKE 1 TABLET BY MOUTH EVERY MONDAY, WEDNESDAY, AND FRIDAY. 07/12/21   Ladell Pier, MD  pregabalin (LYRICA) 75 MG capsule Take 1 capsule (75 mg total) by mouth 2 (two) times daily. 08/28/21   Lorine Bears, NP  propranolol (INDERAL) 10 MG tablet Take 1 tablet (10 mg total) by mouth 2 (two) times daily. 02/16/21   Ladell Pier, MD  sertraline (ZOLOFT) 100 MG tablet TAKE 1 TABLET(100 MG) BY MOUTH DAILY 04/05/21   Arfeen, Arlyce [place], MD  SUMAtriptan (IMITREX) 50 MG tablet 1 tab at the start of headache.  May repeat in 2 hours if no relief.  Max 2 tabs per 24-hour. 03/06/21   Ladell Pier, MD  traMADol (ULTRAM) 50 MG tablet Take 1 tablet (50 mg total) by mouth every 8 (eight) hours as needed for moderate pain or severe pain. 08/28/21 08/28/22  Lorine Bears, NP  valsartan (DIOVAN) 40 MG tablet TAKE 1 TABLET(40 MG) BY MOUTH DAILY 08/15/21   Ladell Pier, MD    Family History Family History  Problem Relation Age of  Onset   Heart disease Mother    Hyperlipidemia Mother    Heart disease Father    Hyperlipidemia Father    Diabetes Father    Heart disease Brother    Hyperlipidemia Brother    Hypertension Brother    Heart disease Brother    Hyperlipidemia Brother    Social History Social History   Tobacco Use   Smoking status: Never   Smokeless tobacco: Never  Vaping Use   Vaping Use: Never used  Substance Use Topics   Alcohol use: Yes    Comment: occ   Drug use: No   Allergies   Other, Sulfa antibiotics, and Trazodone and nefazodone  Review of Systems Review of Systems Pertinent  findings revealed after performing a 14 point review of systems has been noted in the history of present illness.  Physical Exam Triage Vital Signs ED Triage Vitals  Enc Vitals Group     BP 11/11/20 0827 (!) 147/82     Pulse Rate 11/11/20 0827 72     Resp 11/11/20 0827 18     Temp 11/11/20 0827 98.3 F (36.8 C)     Temp Source 11/11/20 0827 Oral     SpO2 11/11/20 0827 98 %     Weight --      Height --      Head Circumference --      Peak Flow --      Pain Score 11/11/20 0826 5     Pain Loc --      Pain Edu? --      Excl. in [place]? --   No data found.  Updated Vital Signs BP 100/68 (BP Location: Right Arm)   Pulse 60   Temp 98.2 F (36.8 C) (Oral)   Resp 12   Ht '5\' 6"'$  (1.676 m)   Wt 200 lb (90.7 kg)   LMP 01/08/2019   SpO2 96%   BMI 32.28 kg/m   Physical Exam Vitals and nursing note reviewed.  Constitutional:      General: She is not in acute distress.    Appearance: Normal appearance. She is not ill-appearing.  HENT:     Head: Normocephalic and atraumatic.     Salivary Glands: Right salivary gland is not diffusely enlarged or tender. Left salivary gland is not diffusely enlarged or tender.     Right Ear: Tympanic membrane, ear canal and external ear normal. No drainage. No middle ear effusion. There is no impacted cerumen. Tympanic membrane is not erythematous or bulging.     Left Ear:  Tympanic membrane, ear canal and external ear normal. No drainage.  No middle ear effusion. There is no impacted cerumen. Tympanic membrane is not erythematous or bulging.     Nose: Nose normal. No nasal deformity, septal deviation, mucosal edema, congestion or rhinorrhea.     Right Turbinates: Not enlarged, swollen or pale.     Left Turbinates: Not enlarged, swollen or pale.     Right Sinus: No maxillary sinus tenderness or frontal sinus tenderness.     Left Sinus: No maxillary sinus tenderness or frontal sinus tenderness.     Mouth/Throat:     Lips: Pink. No lesions.     Mouth: Mucous membranes are moist. No oral lesions.     Pharynx: Oropharynx is clear. Uvula midline. No posterior oropharyngeal erythema or uvula swelling.     Tonsils: No tonsillar exudate. 0 on the right. 0 on the left.  Eyes:     General: Lids are normal.        Right eye: No discharge.        Left eye: No discharge.     Extraocular Movements: Extraocular movements intact.     Conjunctiva/sclera: Conjunctivae normal.     Right eye: Right conjunctiva is not injected.     Left eye: Left conjunctiva is not injected.  Neck:     Trachea: Trachea and phonation normal.  Cardiovascular:     Rate and Rhythm: Normal rate and regular rhythm.     Pulses: Normal pulses.     Heart sounds: Normal heart sounds. No murmur heard.    No friction rub. No gallop.  Pulmonary:     Effort: Pulmonary effort is normal. No  accessory muscle usage, prolonged expiration or respiratory distress.     Breath sounds: Normal breath sounds. No stridor, decreased air movement or transmitted upper airway sounds. No decreased breath sounds, wheezing, rhonchi or rales.  Chest:     Chest wall: No tenderness.  Musculoskeletal:        General: Normal range of motion.     Cervical back: Normal range of motion and neck supple. Normal range of motion.  Lymphadenopathy:     Cervical: No cervical adenopathy.  Skin:    General: Skin is warm and dry.      Findings: No erythema or rash.  Neurological:     General: No focal deficit present.     Mental Status: She is alert and oriented to person, place, and time.  Psychiatric:        Mood and Affect: Mood normal.        Behavior: Behavior normal.     Visual Acuity Right Eye Distance:   Left Eye Distance:   Bilateral Distance:    Right Eye Near:   Left Eye Near:    Bilateral Near:     UC Couse / Diagnostics / Procedures:     Radiology No results found.  Procedures Procedures (including critical care time) EKG  Pending results:  Labs Reviewed  POCT RAPID STREP A, ED / UC    Medications Ordered in UC: Medications - No data to display  UC Diagnoses / Final Clinical Impressions(s)   I have reviewed the triage vital signs and the nursing notes.  Pertinent labs & imaging results that were available during my care of the patient were reviewed by me and considered in my medical decision making (see chart for details).    Final diagnoses:  Pharyngitis, unspecified etiology  Viral pharyngitis  Viral illness   Physical exam today is unremarkable.  Patient is requesting lidocaine solution for throat pain, I am happy to provide her with this prescription.  Given duration of symptoms, do not feel that COVID-19 testing is necessary patient advised that if she wishes to do so she can take a home COVID-19 test.  Patient verbalized agreement with plan.  ED Prescriptions     Medication Sig Dispense Auth. Provider   lidocaine (XYLOCAINE) 2 % solution Use as directed 15 mLs in the mouth or throat every 3 (three) hours as needed for mouth pain (Sore throat). 300 mL Lynden Oxford Scales, PA-C      PDMP not reviewed this encounter.  Disposition Upon Discharge:  Condition: stable for discharge home Home: take medications as prescribed; routine discharge instructions as discussed; follow up as advised.  Patient presented with an acute illness with associated systemic symptoms and  significant discomfort requiring urgent management. In my opinion, this is a condition that a prudent lay person (someone who possesses an average knowledge of health and medicine) may potentially expect to result in complications if not addressed urgently such as respiratory distress, impairment of bodily function or dysfunction of bodily organs.   Routine symptom specific, illness specific and/or disease specific instructions were discussed with the patient and/or caregiver at length.   As such, the patient has been evaluated and assessed, work-up was performed and treatment was provided in alignment with urgent care protocols and evidence based medicine.  Patient/parent/caregiver has been advised that the patient may require follow up for further testing and treatment if the symptoms continue in spite of treatment, as clinically indicated and appropriate.  If the patient was tested for COVID-19,  Influenza and/or RSV, then the patient/parent/guardian was advised to isolate at home pending the results of his/her diagnostic coronavirus test and potentially longer if they're positive. I have also advised pt that if his/her COVID-19 test returns positive, it's recommended to self-isolate for at least 10 days after symptoms first appeared AND until fever-free for 24 hours without fever reducer AND other symptoms have improved or resolved. Discussed self-isolation recommendations as well as instructions for household member/close contacts as per the [facility] and [place] DHHS, and also gave patient the [facility] packet with this information.  Patient/parent/caregiver has been advised to return to the [facility] or PCP in 3-5 days if no better; to PCP or the Emergency Department if new signs and symptoms develop, or if the current signs or symptoms continue to change or worsen for further workup, evaluation and treatment as clinically indicated and appropriate  The patient will follow up with their current PCP if and as advised. If  the patient does not currently have a PCP we will assist them in obtaining one.   The patient may need specialty follow up if the symptoms continue, in spite of conservative treatment and management, for further workup, evaluation, consultation and treatment as clinically indicated and appropriate.  Patient/parent/caregiver verbalized understanding and agreement of plan as discussed.  All questions were addressed during visit.  Please see discharge instructions below for further details of plan.  Discharge Instructions:   Discharge Instructions      Patient with physical exam findings, I believe that you are suffering from a viral illness.  I think it is important to test yourself for COVID-19 however due to the duration of your symptoms, you would no longer benefit from antiviral therapy.  Please read the enclosed information regarding quarantine and isolation with COVID-19.  Your strep test today is negative.  Streptococcal throat culture will be performed per our protocol.  The result of your throat culture will be posted to your MyChart once it is complete, this typically takes 3 to 5 days.   If your streptococcal throat culture is positive, you will be contacted by phone and antibiotics will be prescribed for you.     Please see the list below for recommended medications, dosages and frequencies to provide relief of your current symptoms:    Advil, Motrin (ibuprofen): This is a good anti-inflammatory medication which addresses aches, pains and inflammation of the upper airways that causes sinus and nasal congestion as well as in the lower airways which makes your cough feel tight and sometimes burn.  I recommend that you take between 400 to 600 mg every 6-8 hours as needed.      Tylenol (acetaminophen): This is a good fever reducer.  If your body temperature rises above 101.5 as measured with a thermometer, it is recommended that you take 1,000 mg every 8 hours until your temperature falls  below 101.5, please not take more than 3,000 mg of acetaminophen either as a separate medication or as in ingredient in an over-the-counter cold/flu preparation within a 24-hour period.      Xylocaine (lidocaine): This is a numbing medication that can be swished for 15 seconds and swallowed.  You can use this every 3 hours while awake to relieve pain in your mouth and throat.  I have sent a prescription for this medication to your pharmacy.   Please follow-up within the next 5-7 days either with your primary care provider or urgent care if your symptoms do not resolve.  If you do  not have a primary care provider, we will assist you in finding one.   Thank you for visiting urgent care today.  We appreciate the opportunity to participate in your care.        This office note has been dictated using Museum/gallery curator.  Unfortunately, this method of dictation can sometimes lead to typographical or grammatical errors.  I apologize for your inconvenience in advance if this occurs.  Please do not hesitate to reach out to me if clarification is needed.      Lynden Oxford Scales, PA-C 09/02/21 1925

## 2021-09-02 NOTE — ED Triage Notes (Signed)
Pt is here for a sore throat x1wk with a headache. Pt denies fever

## 2021-09-06 ENCOUNTER — Encounter (HOSPITAL_COMMUNITY): Payer: Self-pay | Admitting: Psychiatry

## 2021-09-06 ENCOUNTER — Telehealth (HOSPITAL_BASED_OUTPATIENT_CLINIC_OR_DEPARTMENT_OTHER): Payer: 59 | Admitting: Psychiatry

## 2021-09-06 VITALS — Wt 200.0 lb

## 2021-09-06 DIAGNOSIS — F331 Major depressive disorder, recurrent, moderate: Secondary | ICD-10-CM | POA: Diagnosis not present

## 2021-09-06 DIAGNOSIS — F419 Anxiety disorder, unspecified: Secondary | ICD-10-CM

## 2021-09-06 DIAGNOSIS — F410 Panic disorder [episodic paroxysmal anxiety] without agoraphobia: Secondary | ICD-10-CM

## 2021-09-06 MED ORDER — BUPROPION HCL ER (XL) 300 MG PO TB24: 300.0000 mg | ORAL_TABLET | Freq: Every day | ORAL | 1 refills | Status: DC

## 2021-09-06 MED ORDER — RISPERIDONE 0.5 MG PO TABS: 0.5000 mg | ORAL_TABLET | Freq: Every day | ORAL | 1 refills | Status: DC

## 2021-09-06 NOTE — Progress Notes (Signed)
Virtual Visit via Telephone Note  I connected with Kara Hall on 09/06/21 at  2:00 PM EDT by telephone and verified that I am speaking with the correct person using two identifiers.  Location: Patient: In Car Provider: Home Office   I discussed the limitations, risks, security and privacy concerns of performing an evaluation and management service by telephone and the availability of in person appointments. I also discussed with the patient that there may be a patient responsible charge related to this service. The patient expressed understanding and agreed to proceed.   History of Present Illness: Patient is evaluated by phone session.  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  Patient told lately 5 people were terminated because they were not doing the job.  One of them was her supervisor.  Patient reported stress from work but manageable.  Her biggest concern is her medication.  She is without REXULTI for past few days because her insurance does not approve that and she admitted lately more depressed, sad, anxious and having some crying spells.  She has to take few times Xanax.  She admitted the [place] was working very well and together with increased dose of Wellbutrin and REXULTI making her mood more stable.  Now she is concerned because her insurance may not approved REXULTI.  She is sleeping not good.  She is helping her mother who is currently in rehab.  Her husband is very supportive.  Patient denies any tremors, shakes or any EPS.  She is pleased that not gaining weight and she noticed improvement when she was taking combination of the medication.  Patient works at [employer].  Recently she was seen in urgent care for pharyngitis.  She is slowly and gradually getting better.  Past Psychiatric History: Tried lexapro from psychiatrist made her hyper.  Allergic to Trazodone and nefazodone.  Headache from Abilify. H/O impulsive buying and excessive  spending. No history of suicidal attempt, psychosis and inpatient treatment.  Psychiatric Specialty Exam: Physical Exam  Review of Systems  Weight 200 lb (90.7 kg), last menstrual period 01/08/2019.There is no height or weight on file to calculate BMI.  General Appearance: NA  Eye Contact:  NA  Speech:  Clear and Coherent and Normal Rate  Volume:  Normal  Mood:  Anxious, Depressed, and Dysphoric  Affect:  NA  Thought Process:  Goal Directed  Orientation:  Full (Time, Place, and Person)  Thought Content:  Rumination  Suicidal Thoughts:  No  Homicidal Thoughts:  No  Memory:  Immediate;   Good Recent;   Good Remote;   Good  Judgement:  Intact  Insight:  Present  Psychomotor Activity:  NA  Concentration:  Concentration: Good and Attention Span: Good  Recall:  Good  Fund of Knowledge:  Good  Language:  Good  Akathisia:  No  Handed:  Right  AIMS (if indicated):     Assets:  Communication Skills Desire for Improvement Housing Talents/Skills Transportation  ADL's:  Intact  Cognition:  WNL  Sleep:   not good, ran out of Rexulti      Assessment and Plan: Major depressive disorder, recurrent.  Anxiety.  Panic attacks.  Discuss her affordability for REXULTI.  She is not sure if the [place] will approve from her insurance.  I recommend to switch to Risperdal and try 0.5 mg to 1 mg.  She like increased dose of Wellbutrin 300 mg in the morning.  She has taken few times  Xanax but does not need a new refill at this time.  I recommend if Risperdal did not work then we may need to try getting prior authorization for REXULTI and in the meantime she can asked to get some samples.  Recommended to call us back if she has any question or any concern.  Follow-up in 2 months.  Follow Up Instructions:    I discussed the assessment and treatment plan with the patient. The patient was provided an opportunity to ask questions and all were answered. The patient agreed with the plan and  demonstrated an understanding of the instructions.   The patient was advised to call back or seek an in-person evaluation if the symptoms worsen or if the condition fails to improve as anticipated.  Collaboration of Care: Other provider involved in patient's care AEB notes are available in epic to review.  Patient/Guardian was advised Release of Information must be obtained prior to any record release in order to collaborate their care with an outside provider. Patient/Guardian was advised if they have not already done so to contact the registration department to sign all necessary forms in order for [place] to release information regarding their care.   Consent: Patient/Guardian gives verbal consent for treatment and assignment of benefits for services provided during this visit. Patient/Guardian expressed understanding and agreed to proceed.    I provided 23 minutes of non-face-to-face time during this encounter.   Kathlee Nations, MD

## 2021-09-13 ENCOUNTER — Ambulatory Visit: Payer: 59 | Admitting: Physical Medicine and Rehabilitation

## 2021-09-19 ENCOUNTER — Other Ambulatory Visit: Payer: Self-pay | Admitting: Internal Medicine

## 2021-09-19 DIAGNOSIS — K219 Gastro-esophageal reflux disease without esophagitis: Secondary | ICD-10-CM

## 2021-09-26 ENCOUNTER — Telehealth: Payer: Self-pay | Admitting: Physical Medicine and Rehabilitation

## 2021-09-26 ENCOUNTER — Ambulatory Visit (HOSPITAL_COMMUNITY)
Admission: EM | Admit: 2021-09-26 | Discharge: 2021-09-26 | Disposition: A | Discharge status: Home / Self Care | Payer: Commercial Managed Care - HMO | Attending: Family Medicine | Admitting: Family Medicine

## 2021-09-26 ENCOUNTER — Encounter (HOSPITAL_COMMUNITY): Payer: Self-pay | Admitting: *Deleted

## 2021-09-26 DIAGNOSIS — N3090 Cystitis, unspecified without hematuria: Secondary | ICD-10-CM | POA: Diagnosis present

## 2021-09-26 LAB — POCT URINALYSIS DIPSTICK, ED / UC
Bilirubin Urine: NEGATIVE
Glucose, UA: NEGATIVE mg/dL
Ketones, ur: NEGATIVE mg/dL
Nitrite: POSITIVE — AB
Protein, ur: NEGATIVE mg/dL
Specific Gravity, Urine: 1.025 (ref 1.005–1.030)
Urobilinogen, UA: 0.2 mg/dL (ref 0.0–1.0)
pH: 5.5 (ref 5.0–8.0)

## 2021-09-26 MED ORDER — NITROFURANTOIN MONOHYD MACRO 100 MG PO CAPS: 100.0000 mg | ORAL_CAPSULE | Freq: Two times a day (BID) | ORAL | 0 refills | Status: DC

## 2021-09-26 MED ORDER — PHENAZOPYRIDINE HCL 100 MG PO TABS: 100.0000 mg | ORAL_TABLET | Freq: Three times a day (TID) | ORAL | 0 refills | Status: DC | PRN

## 2021-09-26 NOTE — ED Triage Notes (Signed)
Pt states that she has had urine frequency since yesterday and extreme pressure lower abdominal. She has taken a pain med she has for her back and 2 tylenol and nothing has helped with the sx.

## 2021-09-26 NOTE — Discharge Instructions (Addendum)
The urinalysis did show red blood cells, white blood cells, and nitrites.  This most likely indicates that you have a bladder infection.  Take nitrofurantoin 100 mg--1 capsule 2 times daily for 5 days  Take Pyridium 100 mg--1 tablet 3 times daily as needed for urinary pain.  This medication usually makes the urine orange  Staff will notify you if the urine culture shows that you need a different antibiotic

## 2021-09-26 NOTE — Telephone Encounter (Signed)
Patient called needing to cancel and schedule her appointment at a later date. The number to contact patient if needed is [phone]

## 2021-09-26 NOTE — ED Provider Notes (Addendum)
[facility]    CSN: [id number] Arrival date & time: 09/26/21  1327      History   Chief Complaint Chief Complaint  Patient presents with   Urinary Frequency    HPI Kara Hall is a 51 y.o. female.    Urinary Frequency   Here for dysuria for 3 days.  No fever or chills and no nausea or vomiting.  Tylenol and other pain medication has not helped the dysuria.    Past Medical History:  Diagnosis Date   Anemia    Anxiety    panic attack, also reports problems with depression, relative to her job & upcoming surgery , relative to life problems - with children & spouse     Back complaints 2013   has had steroid injection    Depression    DVT (deep venous thrombosis) ([place]) 2001   RLE   Fibroids    Foot fracture, left    HTN (hypertension)    Migraine    h/o migraines - as a teenager  & again now with family issues, states she has to lay down & rest in a dark room  (08/28/2013)   Muscle strain of hand    wearing home made splint today, L hand   Peripheral vascular disease ([place])    DVT- per pt. in 2001- had vein removed.    Refusal of blood transfusions as patient is Jehovah's Witness    Thyroid cancer ([place])    Thyroid nodule     Patient Active Problem List   Diagnosis Date Noted   Mixed hyperlipidemia 08/19/2020   Obesity (BMI 30.0-34.9) 08/19/2020   Stressful life event affecting family 08/19/2020   Plantar fasciitis, bilateral 08/19/2020   Status post hysterectomy 12/18/2018   Prolonged Q-T interval on ECG 11/24/2018   Essential hypertension 11/24/2018   Dyspareunia in female 03/24/2018   Increased body mass index 03/24/2018   Carpal tunnel syndrome on both sides 12/10/2017   Achilles tendon contracture, left 10/04/2016   Displaced fracture of fifth metatarsal bone of left foot with delayed healing 05/07/2016   Bilateral wrist pain 11/28/2015   History of thyroid cancer 08/09/2013   Anxiety 01/12/2013    Past Surgical History:   Procedure Laterality Date   CYSTOSCOPY N/A 12/18/2018   Procedure: CYSTOSCOPY;  Surgeon: Jonelle Sidle, MD;  Location: [facility];  Service: Gynecology;  Laterality: N/A;   LAPAROSCOPIC LYSIS OF ADHESIONS N/A 12/18/2018   Procedure: LAPAROSCOPIC LYSIS OF ADHESIONS;  Surgeon: Jonelle Sidle, MD;  Location: [facility];  Service: Gynecology;  Laterality: N/A;   ROBOTIC ASSISTED LAPAROSCOPIC HYSTERECTOMY AND SALPINGECTOMY Bilateral 12/18/2018   Procedure: XI ROBOTIC ASSISTED LAPAROSCOPIC HYSTERECTOMY AND BILATERAL SALPINGECTOMY;  Surgeon: Jonelle Sidle, MD;  Location: [facility];  Service: Gynecology;  Laterality: Bilateral;   THROMBECTOMY Right 2001   leg; after 3rd child was born; for deep vein thrombosis   THYROIDECTOMY Right 08/28/2013   Procedure: RIGHT THYROID LOBECTOMY POSSIBLE TOTAL THYROIDECTOMY;  Surgeon: Jodi Marble, MD;  Location: [facility];  Service: ENT;  Laterality: Right;   THYROIDECTOMY Left 09/10/2013   Procedure: COMPLETE LEFT THYROIDECTOMY;  Surgeon: Jodi Marble, MD;  Location: [facility];  Service: ENT;  Laterality: Left;   TUBAL LIGATION  2001    OB History   No obstetric history on file.      Home Medications    Prior to Admission medications   Medication Sig Start Date End Date Taking? Authorizing Provider  ALPRAZolam (XANAX) 0.5 MG tablet Take 0.5-1 tablets (0.25-0.5 mg total) by mouth daily as needed for anxiety. 07/26/21  Yes Arfeen, Arlyce Harman, MD  amLODipine (NORVASC) 5 MG tablet Take 1 tablet (5 mg total) by mouth daily. 02/16/21  Yes Ladell Pier, MD  buPROPion (WELLBUTRIN XL) 300 MG 24 hr tablet Take 1 tablet (300 mg total) by mouth daily. 09/06/21  Yes Arfeen, Arlyce Harman, MD  butalbital-acetaminophen-caffeine (FIORICET) [phone] MG tablet TAKE 1 TO 2 TABLETS BY MOUTH DAILY AS NEEDED 09/02/21  Yes Ladell Pier, MD  fluticasone [facility]) 50 MCG/ACT nasal spray Place 2 sprays into both  nostrils daily. 04/26/21  Yes Ladell Pier, MD  levothyroxine (SYNTHROID, LEVOTHROID) 125 MCG tablet Take 125 mcg by mouth daily before breakfast.    Yes [provider]  lidocaine (XYLOCAINE) 2 % solution Use as directed 15 mLs in the mouth or throat every 3 (three) hours as needed for mouth pain (Sore throat). 09/02/21  Yes Lynden Oxford Scales, PA-C  loratadine (CLARITIN) 10 MG tablet TAKE 1 TABLET BY MOUTH AT BEDTIME 07/12/21  Yes Ladell Pier, MD  nitrofurantoin, macrocrystal-monohydrate, (MACROBID) 100 MG capsule Take 1 capsule (100 mg total) by mouth 2 (two) times daily. 09/26/21  Yes Barrett Henle, MD  phenazopyridine (PYRIDIUM) 100 MG tablet Take 1 tablet (100 mg total) by mouth 3 (three) times daily as needed (urinary pain). 09/26/21  Yes Barrett Henle, MD  pravastatin (PRAVACHOL) 10 MG tablet TAKE 1 TABLET BY MOUTH EVERY MONDAY, WEDNESDAY, AND FRIDAY. 07/12/21  Yes Ladell Pier, MD  pregabalin (LYRICA) 75 MG capsule Take 1 capsule (75 mg total) by mouth 2 (two) times daily. 08/28/21  Yes Lorine Bears, NP  propranolol (INDERAL) 10 MG tablet Take 1 tablet (10 mg total) by mouth 2 (two) times daily. 02/16/21  Yes Ladell Pier, MD  risperiDONE (RISPERDAL) 0.5 MG tablet Take 1-2 tablets (0.5-1 mg total) by mouth at bedtime. 09/06/21 09/06/22 Yes Arfeen, Arlyce Harman, MD  SUMAtriptan (IMITREX) 50 MG tablet 1 tab at the start of headache.  May repeat in 2 hours if no relief.  Max 2 tabs per 24-hour. 03/06/21  Yes Ladell Pier, MD  traMADol (ULTRAM) 50 MG tablet Take 1 tablet (50 mg total) by mouth every 8 (eight) hours as needed for moderate pain or severe pain. 08/28/21 08/28/22 Yes Lorine Bears, NP  valsartan (DIOVAN) 40 MG tablet TAKE 1 TABLET(40 MG) BY MOUTH DAILY 08/15/21  Yes Ladell Pier, MD  omeprazole (PRILOSEC) 20 MG capsule TAKE 1 CAPSULE(20 MG) BY MOUTH DAILY 09/20/21   Ladell Pier, MD    Family History Family History  Problem  Relation Age of Onset   Heart disease Mother    Hyperlipidemia Mother    Heart disease Father    Hyperlipidemia Father    Diabetes Father    Heart disease Brother    Hyperlipidemia Brother    Hypertension Brother    Heart disease Brother    Hyperlipidemia Brother     Social History Social History   Tobacco Use   Smoking status: Never   Smokeless tobacco: Never  Vaping Use   Vaping Use: Never used  Substance Use Topics   Alcohol use: Yes    Comment: occ   Drug use: No     Allergies   Other, Sulfa antibiotics, and Trazodone and nefazodone   Review of Systems Review of Systems  Genitourinary:  Positive for frequency.  Physical Exam Triage Vital Signs ED Triage Vitals  Enc Vitals Group     BP 09/26/21 1456 113/74     Pulse Rate 09/26/21 1456 73     Resp 09/26/21 1456 18     Temp 09/26/21 1456 98.3 F (36.8 C)     Temp Source 09/26/21 1456 Oral     SpO2 09/26/21 1456 95 %     Weight --      Height --      Head Circumference --      Peak Flow --      Pain Score 09/26/21 1452 9     Pain Loc --      Pain Edu? --      Excl. in [place]? --    No data found.  Updated Vital Signs BP 113/74 (BP Location: Left Arm)   Pulse 73   Temp 98.3 F (36.8 C) (Oral)   Resp 18   LMP 01/08/2019   SpO2 95%   Visual Acuity Right Eye Distance:   Left Eye Distance:   Bilateral Distance:    Right Eye Near:   Left Eye Near:    Bilateral Near:     Physical Exam Vitals reviewed.  Constitutional:      General: She is not in acute distress.    Appearance: She is not ill-appearing, toxic-appearing or diaphoretic.  HENT:     Mouth/Throat:     Mouth: Mucous membranes are moist.  Cardiovascular:     Rate and Rhythm: Normal rate and regular rhythm.     Heart sounds: No murmur heard. Pulmonary:     Effort: Pulmonary effort is normal.     Breath sounds: Normal breath sounds.  Abdominal:     Tenderness: There is no right CVA tenderness or left CVA tenderness.      Comments: Suprapubic tenderness  Skin:    Coloration: Skin is not pale.  Neurological:     Mental Status: She is alert and oriented to person, place, and time.  Psychiatric:        Behavior: Behavior normal.      UC Treatments / Results  Labs (all labs ordered are listed, but only abnormal results are displayed) Labs Reviewed  POCT URINALYSIS DIPSTICK, ED / UC - Abnormal; Notable for the following components:      Result Value   Hgb urine dipstick MODERATE (*)    Nitrite POSITIVE (*)    Leukocytes,Ua SMALL (*)    All other components within normal limits  URINE CULTURE    EKG   Radiology No results found.  Procedures Procedures (including critical care time)  Medications Ordered in UC Medications - No data to display  Initial Impression / Assessment and Plan / UC Course  I have reviewed the triage vital signs and the nursing notes.  Pertinent labs & imaging results that were available during my care of the patient were reviewed by me and considered in my medical decision making (see chart for details).          UA shows blood leukocytes and nitrites.  I will treat with nitrofurantoin and Pyridium  Final Clinical Impressions(s) / UC Diagnoses   Final diagnoses:  Cystitis     Discharge Instructions      The urinalysis did show red blood cells, white blood cells, and nitrites.  This most likely indicates that you have a bladder infection.  Take nitrofurantoin 100 mg--1 capsule 2 times daily for 5 days  Take Pyridium 100 mg--1  tablet 3 times daily as needed for urinary pain.  This medication usually makes the urine orange  Staff will notify you if the urine culture shows that you need a different antibiotic     ED Prescriptions     Medication Sig Dispense Auth. Provider   nitrofurantoin, macrocrystal-monohydrate, (MACROBID) 100 MG capsule Take 1 capsule (100 mg total) by mouth 2 (two) times daily. 10 capsule Barrett Henle, MD    phenazopyridine (PYRIDIUM) 100 MG tablet Take 1 tablet (100 mg total) by mouth 3 (three) times daily as needed (urinary pain). 10 tablet Windy Carina Gwenlyn Perking, MD      I have reviewed the PDMP during this encounter.   Barrett Henle, MD 09/26/21 1523    Barrett Henle, MD 09/26/21 1524

## 2021-09-27 NOTE — Telephone Encounter (Signed)
Pt called and states that she cant have any days off due to her company not allowing anyone off. Pt will call back when able to come back in.   CB [phone]

## 2021-09-27 NOTE — Telephone Encounter (Signed)
Tried calling. No answer. LMVM

## 2021-10-04 ENCOUNTER — Ambulatory Visit: Payer: 59 | Admitting: Physical Medicine and Rehabilitation

## 2021-10-04 LAB — URINE CULTURE: Culture: >=100000 — AB

## 2021-10-06 ENCOUNTER — Other Ambulatory Visit: Payer: Self-pay | Admitting: Internal Medicine

## 2021-10-06 DIAGNOSIS — I10 Essential (primary) hypertension: Secondary | ICD-10-CM

## 2021-10-06 NOTE — Telephone Encounter (Signed)
Pt must attend appt on 10/16/21 in order to receive refills. 2 courtesy refills have been given.  Requested Prescriptions  Refused Prescriptions Disp Refills  . valsartan (DIOVAN) 40 MG tablet [Pharmacy Med Name: VALSARTAN '40MG'$  TABLETS] 30 tablet 1    Sig: TAKE 1 TABLET(40 MG) BY MOUTH DAILY     Cardiovascular:  Angiotensin Receptor Blockers Failed - 10/06/2021 11:38 AM      Failed - Cr in normal range and within 180 days    Creat  Date Value Ref Range Status  11/23/2014 0.48 (L) 0.50 - 1.10 mg/dL Final   Creatinine, Ser  Date Value Ref Range Status  08/31/2020 0.54 (L) 0.57 - 1.00 mg/dL Final         Failed - K in normal range and within 180 days    Potassium  Date Value Ref Range Status  08/31/2020 3.9 3.5 - 5.2 mmol/L Final         Failed - Valid encounter within last 6 months    Recent Outpatient Visits          7 months ago Essential hypertension   [place], MD   9 months ago Strep throat   [place], Zelda W, NP   1 year ago Essential hypertension   [place], RPH-CPP   1 year ago Essential hypertension   [place], RPH-CPP   1 year ago Essential hypertension   [place], MD      Future Appointments            In 1 week Ladell Pier, MD [place] - Patient is not pregnant      Passed - Last BP in normal range    BP Readings from Last 1 Encounters:  09/26/21 113/74

## 2021-10-08 ENCOUNTER — Other Ambulatory Visit: Payer: Self-pay | Admitting: Internal Medicine

## 2021-10-08 DIAGNOSIS — E782 Mixed hyperlipidemia: Secondary | ICD-10-CM

## 2021-10-09 NOTE — Telephone Encounter (Signed)
Requested Prescriptions  Pending Prescriptions Disp Refills  . pravastatin (PRAVACHOL) 10 MG tablet [Pharmacy Med Name: PRAVASTATIN '10MG'$  TABLETS] 12 tablet 2    Sig: TAKE 1 TABLET BY MOUTH EVERY MONDAY, WEDNESDAY, [place]     Cardiovascular:  Antilipid - Statins Failed - 10/08/2021 12:56 PM      Failed - Lipid Panel in normal range within the last 12 months    Cholesterol, Total  Date Value Ref Range Status  05/19/2020 274 (H) 100 - 199 mg/dL Final   LDL Chol Calc (NIH)  Date Value Ref Range Status  05/19/2020 195 (H) 0 - 99 mg/dL Final   HDL  Date Value Ref Range Status  05/19/2020 60 >39 mg/dL Final   Triglycerides  Date Value Ref Range Status  05/19/2020 110 0 - 149 mg/dL Final         Passed - Patient is not pregnant      Passed - Valid encounter within last 12 months    Recent Outpatient Visits          7 months ago Essential hypertension   [place], MD   9 months ago Strep throat   [place], [name], NP   1 year ago Essential hypertension   [place], RPH-CPP   1 year ago Essential hypertension   [place], RPH-CPP   1 year ago Essential hypertension   [place], MD      Future Appointments            In 1 week Ladell Pier, MD [place]

## 2021-10-16 ENCOUNTER — Encounter: Payer: Self-pay | Admitting: Internal Medicine

## 2021-10-16 ENCOUNTER — Ambulatory Visit: Payer: Commercial Managed Care - HMO | Attending: Internal Medicine | Admitting: Internal Medicine

## 2021-10-16 VITALS — BP 105/73 | HR 64 | Temp 98.1°F | Ht 66.0 in | Wt 203.0 lb

## 2021-10-16 DIAGNOSIS — Z6832 Body mass index (BMI) 32.0-32.9, adult: Secondary | ICD-10-CM

## 2021-10-16 DIAGNOSIS — E782 Mixed hyperlipidemia: Secondary | ICD-10-CM | POA: Diagnosis not present

## 2021-10-16 DIAGNOSIS — Z23 Encounter for immunization: Secondary | ICD-10-CM

## 2021-10-16 DIAGNOSIS — Z1211 Encounter for screening for malignant neoplasm of colon: Secondary | ICD-10-CM

## 2021-10-16 DIAGNOSIS — R0982 Postnasal drip: Secondary | ICD-10-CM | POA: Diagnosis not present

## 2021-10-16 DIAGNOSIS — G43009 Migraine without aura, not intractable, without status migrainosus: Secondary | ICD-10-CM | POA: Diagnosis not present

## 2021-10-16 DIAGNOSIS — E669 Obesity, unspecified: Secondary | ICD-10-CM

## 2021-10-16 DIAGNOSIS — I10 Essential (primary) hypertension: Secondary | ICD-10-CM | POA: Diagnosis not present

## 2021-10-16 DIAGNOSIS — Z85850 Personal history of malignant neoplasm of thyroid: Secondary | ICD-10-CM

## 2021-10-16 MED ORDER — VALSARTAN 40 MG PO TABS: ORAL_TABLET | ORAL | 1 refills | Status: DC

## 2021-10-16 MED ORDER — BUTALBITAL-APAP-CAFFEINE 50-325-40 MG PO TABS: ORAL_TABLET | ORAL | 1 refills | Status: DC

## 2021-10-16 MED ORDER — LORATADINE 10 MG PO TABS: 10.0000 mg | ORAL_TABLET | Freq: Every day | ORAL | 1 refills | Status: DC

## 2021-10-16 MED ORDER — AMLODIPINE BESYLATE 5 MG PO TABS: 5.0000 mg | ORAL_TABLET | Freq: Every day | ORAL | 1 refills | Status: DC

## 2021-10-16 MED ORDER — PROPRANOLOL HCL 10 MG PO TABS: 10.0000 mg | ORAL_TABLET | Freq: Two times a day (BID) | ORAL | 1 refills | Status: DC

## 2021-10-16 NOTE — Progress Notes (Signed)
Patient ID: Kara Hall, female    DOB: 08-Jun-1970  MRN: [id number]  CC: Hypertension (HTN f/u. Refill on meds. )   Subjective: Kara Hall is a 51 y.o. female who presents for chronic ds management Her concerns today include:  Patient with history of HTN, thyroid cancer (dx age 89, s/p thyroidectomy), anxiety/dep, GERD, lumbar radiculopathy, HL.   HTN:  taking Diovan 40 mg and Norvasc 5 mg daily.   Request RF on both for 90 day supply No checking BP but does have a device.  She limits salt in foods as much as she can  Wgh:  up 6 lbs since last visit with me 03/2021.  Carries a sandwich and some chips for lunch with [employer] drink that is 60 calories.  Trying to drink more water.  She eats steam veggies at least 2x a wk.  Snacks late at nights when she gets off work - crackers with cream cheese.  -Does a lot of walking at work.  She has a fit bit on her wristwatch.  Getting in about 11,000 steps per day.  HL:  taking Pravachol 2xwk as prescribed and tolerating it.  Hx of Papillary thyroid:  followed by endo Dr. Buddy Duty last seen March.  Taking Levothyroxine consistently   Migraine: not occurring AS OFTEN  BUT GETS THEM NOW ONCE A WK DUE TO INCREASE STRESS ON JOB AND HAVING TO WORK MORE OVER TIME DUE TO STAFF SHORTAGE.  Requesting refill on propranolol and Fioricet to take as needed.  HM: Patient is due for flu vaccine, Tdap and colon cancer screening.  She is agreeable to receiving the vaccines today.  She is agreeable to referral for colonoscopy for colon cancer screening. Patient Active Problem List   Diagnosis Date Noted   Mixed hyperlipidemia 08/19/2020   Obesity (BMI 30.0-34.9) 08/19/2020   Stressful life event affecting family 08/19/2020   Plantar fasciitis, bilateral 08/19/2020   Status post hysterectomy 12/18/2018   Prolonged Q-T interval on ECG 11/24/2018   Essential hypertension 11/24/2018   Dyspareunia in female 03/24/2018   Increased body mass index  03/24/2018   Carpal tunnel syndrome on both sides 12/10/2017   Achilles tendon contracture, left 10/04/2016   Displaced fracture of fifth metatarsal bone of left foot with delayed healing 05/07/2016   Bilateral wrist pain 11/28/2015   History of thyroid cancer 08/09/2013   Anxiety 01/12/2013     Current Outpatient Medications on File Prior to Visit  Medication Sig Dispense Refill   ALPRAZolam (XANAX) 0.5 MG tablet Take 0.5-1 tablets (0.25-0.5 mg total) by mouth daily as needed for anxiety. 15 tablet 0   amLODipine (NORVASC) 5 MG tablet Take 1 tablet (5 mg total) by mouth daily. 90 tablet 1   buPROPion (WELLBUTRIN XL) 300 MG 24 hr tablet Take 1 tablet (300 mg total) by mouth daily. 30 tablet 1   butalbital-acetaminophen-caffeine (FIORICET) 50-325-40 MG tablet TAKE 1 TO 2 TABLETS BY MOUTH DAILY AS NEEDED 15 tablet 0   fluticasone (FLONASE) 50 MCG/ACT nasal spray Place 2 sprays into both nostrils daily. 16 g 1   levothyroxine (SYNTHROID, LEVOTHROID) 125 MCG tablet Take 125 mcg by mouth daily before breakfast.      nitrofurantoin, macrocrystal-monohydrate, (MACROBID) 100 MG capsule Take 1 capsule (100 mg total) by mouth 2 (two) times daily. 10 capsule 0   omeprazole (PRILOSEC) 20 MG capsule TAKE 1 CAPSULE(20 MG) BY MOUTH DAILY 30 capsule 0   phenazopyridine (PYRIDIUM) 100 MG tablet Take 1 tablet (100 mg  total) by mouth 3 (three) times daily as needed (urinary pain). 10 tablet 0   pravastatin (PRAVACHOL) 10 MG tablet TAKE 1 TABLET BY MOUTH EVERY MONDAY, WEDNESDAY, FRIDAY 12 tablet 2   pregabalin (LYRICA) 75 MG capsule Take 1 capsule (75 mg total) by mouth 2 (two) times daily. 60 capsule 0   propranolol (INDERAL) 10 MG tablet Take 1 tablet (10 mg total) by mouth 2 (two) times daily. 60 tablet 3   risperiDONE (RISPERDAL) 0.5 MG tablet Take 1-2 tablets (0.5-1 mg total) by mouth at bedtime. 30 tablet 1   SUMAtriptan (IMITREX) 50 MG tablet 1 tab at the start of headache.  May repeat in 2 hours if no  relief.  Max 2 tabs per 24-hour. 10 tablet 1   traMADol (ULTRAM) 50 MG tablet Take 1 tablet (50 mg total) by mouth every 8 (eight) hours as needed for moderate pain or severe pain. 25 tablet 0   valsartan (DIOVAN) 40 MG tablet TAKE 1 TABLET(40 MG) BY MOUTH DAILY 30 tablet 1   lidocaine (XYLOCAINE) 2 % solution Use as directed 15 mLs in the mouth or throat every 3 (three) hours as needed for mouth pain (Sore throat). (Patient not taking: Reported on 10/16/2021) 300 mL 0   loratadine (CLARITIN) 10 MG tablet TAKE 1 TABLET BY MOUTH AT BEDTIME (Patient not taking: Reported on 10/16/2021) 90 tablet 0   Current Facility-Administered Medications on File Prior to Visit  Medication Dose Route Frequency Provider Last Rate Last Admin   triamcinolone acetonide (KENALOG) 10 MG/ML injection 10 mg  10 mg Other Once Landis Martins, DPM        Allergies  Allergen Reactions   Other     BLOOD REFUSAL    Sulfa Antibiotics Other (See Comments) and Nausea And Vomiting    Severe bladder, and kidney infection   Trazodone And Nefazodone Other (See Comments)    Headache every am     Social History   Socioeconomic History   Marital status: Married    Spouse name: [name]   Number of children: 3   Years of education: 12+   Highest education level: Not on file  Occupational History   Occupation: [employer]   Occupation: Cosmetologist    Employer: [employer]  Tobacco Use   Smoking status: Never   Smokeless tobacco: Never  Vaping Use   Vaping Use: Never used  Substance and Sexual Activity   Alcohol use: Yes    Comment: occ   Drug use: No   Sexual activity: Yes    Partners: Male    Birth control/protection: Surgical    Comment: Tubal ligation  Other Topics Concern   Not on file  Social History Narrative   Divorce from previous husband in 06/2016.   Now remarried 07/2016.   Lives at home with her new husband and cat.   She has three grown children who live with her husband. She has a good  relationship with her children.   Her father passed away May 02, 2016.   Social Determinants of Health   Financial Resource Strain: Not on file  Food Insecurity: Not on file  Transportation Needs: Not on file  Physical Activity: Not on file  Stress: Not on file  Social Connections: Not on file  Intimate Partner Violence: Not on file    Family History  Problem Relation Age of Onset   Heart disease Mother    Hyperlipidemia Mother    Heart disease Father    Hyperlipidemia Father  Diabetes Father    Heart disease Brother    Hyperlipidemia Brother    Hypertension Brother    Heart disease Brother    Hyperlipidemia Brother     Past Surgical History:  Procedure Laterality Date   CYSTOSCOPY N/A 12/18/2018   Procedure: CYSTOSCOPY;  Surgeon: Jonelle Sidle, MD;  Location: [facility];  Service: Gynecology;  Laterality: N/A;   LAPAROSCOPIC LYSIS OF ADHESIONS N/A 12/18/2018   Procedure: LAPAROSCOPIC LYSIS OF ADHESIONS;  Surgeon: Jonelle Sidle, MD;  Location: [facility];  Service: Gynecology;  Laterality: N/A;   ROBOTIC ASSISTED LAPAROSCOPIC HYSTERECTOMY AND SALPINGECTOMY Bilateral 12/18/2018   Procedure: XI ROBOTIC ASSISTED LAPAROSCOPIC HYSTERECTOMY AND BILATERAL SALPINGECTOMY;  Surgeon: Jonelle Sidle, MD;  Location: [facility];  Service: Gynecology;  Laterality: Bilateral;   THROMBECTOMY Right 2001   leg; after 3rd child was born; for deep vein thrombosis   THYROIDECTOMY Right 08/28/2013   Procedure: RIGHT THYROID LOBECTOMY POSSIBLE TOTAL THYROIDECTOMY;  Surgeon: Jodi Marble, MD;  Location: [facility];  Service: ENT;  Laterality: Right;   THYROIDECTOMY Left 09/10/2013   Procedure: COMPLETE LEFT THYROIDECTOMY;  Surgeon: Jodi Marble, MD;  Location: [facility];  Service: ENT;  Laterality: Left;   TUBAL LIGATION  2001    ROS: Review of Systems Negative except as stated above  PHYSICAL EXAM: BP 105/73 (BP Location: Left  Arm, Patient Position: Sitting, Cuff Size: Normal)   Pulse 64   Temp 98.1 F (36.7 C) (Oral)   Ht '5\' 6"'$  (1.676 m)   Wt 203 lb (92.1 kg)   LMP 01/08/2019   SpO2 96%   BMI 32.77 kg/m   Wt Readings from Last 3 Encounters:  10/16/21 203 lb (92.1 kg)  09/06/21 200 lb (90.7 kg)  09/02/21 200 lb (90.7 kg)    Physical Exam  General appearance - alert, well appearing, middle-age Caucasian female and in no distress Mental status - normal mood, behavior, speech, dress, motor activity, and thought processes Neck - supple, no significant adenopathy Chest - clear to auscultation, no wheezes, rales or rhonchi, symmetric air entry Heart - normal rate, regular rhythm, normal S1, S2, no murmurs, rubs, clicks or gallops Extremities - peripheral pulses normal, no pedal edema, no clubbing or cyanosis     10/16/2021    1:42 PM 04/05/2021    9:46 AM 02/16/2021   11:41 AM  Depression screen PHQ 2/9  Decreased Interest 0  3  Down, Depressed, Hopeless 0 1 3  PHQ - 2 Score 0 1 6  Altered sleeping   3  Tired, decreased energy   3  Change in appetite   3  Feeling bad or failure about yourself    3  Trouble concentrating   2  Moving slowly or fidgety/restless   1  Suicidal thoughts   0  PHQ-9 Score   21       Latest Ref Rng & Units 08/31/2020    3:02 PM 05/19/2020    9:26 AM 12/19/2018    5:05 AM  CMP  Glucose 65 - 99 mg/dL 91  102  122   BUN 6 - 24 mg/dL '14  14  16   '$ Creatinine 0.57 - 1.00 mg/dL 0.54  0.66  0.73   Sodium 134 - 144 mmol/L 139  136  134   Potassium 3.5 - 5.2 mmol/L 3.9  4.3  3.9   Chloride 96 - 106 mmol/L 103  100  100   CO2 20 - 29  mmol/L '20  20  22   '$ Calcium 8.7 - 10.2 mg/dL 8.2  8.7  7.8   Total Protein 6.0 - 8.5 g/dL  7.2    Total Bilirubin 0.0 - 1.2 mg/dL  0.4    Alkaline Phos 44 - 121 IU/L  96    AST 0 - 40 IU/L  22    ALT 0 - 32 IU/L  29     Lipid Panel     Component Value Date/Time   CHOL 274 (H) 05/19/2020 0926   TRIG 110 05/19/2020 0926   HDL 60 05/19/2020  0926   CHOLHDL 4.6 (H) 05/19/2020 0926   CHOLHDL 2.7 11/23/2014 1554   VLDL 11 11/23/2014 1554   LDLCALC 195 (H) 05/19/2020 0926    CBC    Component Value Date/Time   WBC 7.3 05/19/2020 0926   WBC 13.9 (H) 12/19/2018 0812   RBC 4.56 05/19/2020 0926   RBC 3.55 (L) 12/19/2018 0812   HGB 14.5 05/19/2020 0926   HCT 42.4 05/19/2020 0926   PLT 288 05/19/2020 0926   MCV 93 05/19/2020 0926   MCH 31.8 05/19/2020 0926   MCH 28.2 12/19/2018 0812   MCHC 34.2 05/19/2020 0926   MCHC 30.6 12/19/2018 0812   RDW 12.2 05/19/2020 0926   LYMPHSABS 1.5 05/19/2020 0926   MONOABS 0.5 11/23/2014 1554   EOSABS 0.2 05/19/2020 0926   BASOSABS 0.1 05/19/2020 0926    ASSESSMENT AND PLAN:  1. Essential hypertension At goal.  Continue amlodipine and Diovan. - amLODipine (NORVASC) 5 MG tablet; Take 1 tablet (5 mg total) by mouth daily.  Dispense: 90 tablet; Refill: 1 - valsartan (DIOVAN) 40 MG tablet; TAKE 1 TABLET(40 MG) BY MOUTH DAILY  Dispense: 90 tablet; Refill: 1 - CBC - Comprehensive metabolic panel  2. Mixed hyperlipidemia Continue Pravachol twice a week.  She is due for lipid profile checked today. - Lipid panel  3. Migraine without aura and without status migrainosus, not intractable - propranolol (INDERAL) 10 MG tablet; Take 1 tablet (10 mg total) by mouth 2 (two) times daily.  Dispense: 180 tablet; Refill: 1 - butalbital-acetaminophen-caffeine (FIORICET) 50-325-40 MG tablet; TAKE 1 TO 2 TABLETS BY MOUTH DAILY AS NEEDED  Dispense: 15 tablet; Refill: 1  4. Postnasal drip Patient requested refill on Claritin which she uses for allergies and postnasal drip. - loratadine (CLARITIN) 10 MG tablet; Take 1 tablet (10 mg total) by mouth at bedtime.  Dispense: 90 tablet; Refill: 1  5. History of thyroid cancer Followed by endocrinology Dr. Buddy Duty  6. Obesity (BMI 30.0-34.9) Discussed and encourage healthy eating habits.  Advised patient not to drink her calories and to drink more water.  Try to  incorporate several servings of fresh fruits and vegetables into the diet daily.  7. Need for influenza vaccination Given today.  8. Need for Tdap vaccination Given today.  9. Screening for colon cancer - Ambulatory referral to Gastroenterology   Patient was given the opportunity to ask questions.  Patient verbalized understanding of the plan and was able to repeat key elements of the plan.   This documentation was completed using Radio producer.  Any transcriptional errors are unintentional.  No orders of the defined types were placed in this encounter.    Requested Prescriptions    No prescriptions requested or ordered in this encounter    No follow-ups on file.  Karle Plumber, MD, FACP

## 2021-10-17 ENCOUNTER — Encounter: Payer: Self-pay | Admitting: Gastroenterology

## 2021-10-19 ENCOUNTER — Other Ambulatory Visit: Payer: Self-pay | Admitting: Internal Medicine

## 2021-10-19 DIAGNOSIS — K219 Gastro-esophageal reflux disease without esophagitis: Secondary | ICD-10-CM

## 2021-10-19 NOTE — Telephone Encounter (Signed)
Requested Prescriptions  Pending Prescriptions Disp Refills  . omeprazole (PRILOSEC) 20 MG capsule [Pharmacy Med Name: OMEPRAZOLE '20MG'$  CAPSULES] 90 capsule 0    Sig: TAKE 1 CAPSULE(20 MG) BY MOUTH DAILY     Gastroenterology: Proton Pump Inhibitors Passed - 10/19/2021  6:22 AM      Passed - Valid encounter within last 12 months    Recent Outpatient Visits          3 days ago Essential hypertension   [place], MD   8 months ago Essential hypertension   [place], MD   10 months ago Strep throat   [place], [name], NP   1 year ago Essential hypertension   [place], RPH-CPP   1 year ago Essential hypertension   [place], RPH-CPP      Future Appointments            In 4 months Wynetta Emery, Dalbert Batman, MD [place]

## 2021-11-01 ENCOUNTER — Ambulatory Visit (AMBULATORY_SURGERY_CENTER): Payer: Self-pay

## 2021-11-01 VITALS — Ht 66.0 in | Wt 203.0 lb

## 2021-11-01 DIAGNOSIS — Z1211 Encounter for screening for malignant neoplasm of colon: Secondary | ICD-10-CM

## 2021-11-01 MED ORDER — NA SULFATE-K SULFATE-MG SULF 17.5-3.13-1.6 GM/177ML PO SOLN: 1.0000 | ORAL | 0 refills | Status: DC

## 2021-11-01 NOTE — Progress Notes (Signed)
No egg or soy allergy known to patient  No issues known to pt with past sedation with any surgeries or procedures Patient denies ever being told they had issues or difficulty with intubation  No FH of Malignant Hyperthermia Pt is not on diet pills Pt is not on  home 02  Pt is not on blood thinners  Pt denies issues with constipation  No A fib or A flutter Have any cardiac testing pending--denied Pt instructed to use Singlecare.[email or web address] or GoodRx for a price reduction on prep   

## 2021-11-07 ENCOUNTER — Other Ambulatory Visit: Payer: Self-pay | Admitting: Physical Medicine and Rehabilitation

## 2021-11-07 ENCOUNTER — Encounter (HOSPITAL_COMMUNITY): Payer: Self-pay | Admitting: Psychiatry

## 2021-11-07 ENCOUNTER — Telehealth: Payer: Self-pay | Admitting: Physical Medicine and Rehabilitation

## 2021-11-07 ENCOUNTER — Telehealth (HOSPITAL_BASED_OUTPATIENT_CLINIC_OR_DEPARTMENT_OTHER): Payer: 59 | Admitting: Psychiatry

## 2021-11-07 VITALS — Wt 203.0 lb

## 2021-11-07 DIAGNOSIS — F410 Panic disorder [episodic paroxysmal anxiety] without agoraphobia: Secondary | ICD-10-CM

## 2021-11-07 DIAGNOSIS — F419 Anxiety disorder, unspecified: Secondary | ICD-10-CM

## 2021-11-07 DIAGNOSIS — F331 Major depressive disorder, recurrent, moderate: Secondary | ICD-10-CM

## 2021-11-07 MED ORDER — HYDROXYZINE HCL 10 MG PO TABS: 10.0000 mg | ORAL_TABLET | Freq: Every evening | ORAL | 0 refills | Status: DC | PRN

## 2021-11-07 MED ORDER — RISPERIDONE 1 MG PO TABS: 1.0000 mg | ORAL_TABLET | Freq: Every day | ORAL | 1 refills | Status: DC

## 2021-11-07 MED ORDER — ALPRAZOLAM 0.5 MG PO TABS: 0.2500 mg | ORAL_TABLET | Freq: Every day | ORAL | 0 refills | Status: DC | PRN

## 2021-11-07 MED ORDER — BUPROPION HCL ER (XL) 300 MG PO TB24: 300.0000 mg | ORAL_TABLET | Freq: Every day | ORAL | 1 refills | Status: DC

## 2021-11-07 NOTE — Progress Notes (Signed)
Virtual Visit via Telephone Note  I connected with Kara Kara Hall on 11/07/21 at  4:20 PM EDT by telephone and verified that I am speaking with the correct person using two identifiers.  Location: Patient: In car Provider: Home Office   I discussed the limitations, risks, security and privacy concerns of performing an evaluation and management service by telephone and the availability of in person appointments. I also discussed with the patient that there may be a patient responsible charge related to this service. The patient expressed understanding and agreed to proceed.   History of Present Illness: Patient is evaluated by phone session.  Today she cannot log in for video because she was very busy at work.  She reported a lot of stress and anxiety because job is very challenging and she do not have time to think about other things.  She works at a [employer] and usually it is very busy around the holidays.  She also sad because despite helping her mother she do not like when mother complains that she is not around for her.  She is taking Risperdal after insurance did not approve REXULTI.  She is taking 0.5 mg and so far notices no side effects but wondering if she need to take a higher dose because she is not sleeping very well.  She reported sometimes feeling of hopelessness, decreased energy, racing thoughts.  Her husband is supportive.  She admitted 3 times panic attack and took Xanax.  She also having upcoming colonoscopy and she is very worried about the procedure.  She like to get some Xanax so she can have a better control on her anxiety.  She has no tremor or shakes or any EPS.  She is taking Wellbutrin 300 mg in the morning.  She tried over-the-counter sleep medicine but that make her very groggy and sleepy.  In the past she had tried melatonin and trazodone that did not work.  She apologized for not trying to see a therapist because her schedule is so busy that she do  not have time to see a therapist.  She denies drinking or using any illegal substances.  Past Psychiatric History: Tried lexapro from psychiatrist made her hyper.  Allergic to Trazodone and nefazodone.  Headache from Abilify. H/O impulsive buying and excessive spending. No history of suicidal attempt, psychosis and inpatient treatment.  Psychiatric Specialty Exam: Physical Exam  Review of Systems  Weight 203 lb (92.1 kg), last menstrual period 01/08/2019.There is no height or weight on file to calculate BMI.  General Appearance: NA  Eye Contact:  NA  Speech:  Slow  Volume:  Decreased  Mood:  Anxious, Depressed, and Dysphoric  Affect:  NA  Thought Process:  Goal Directed  Orientation:  Full (Time, Place, and Person)  Thought Content:  Rumination  Suicidal Thoughts:  No  Homicidal Thoughts:  No  Memory:  Immediate;   Good Recent;   Good Remote;   Good  Judgement:  Intact  Insight:  Shallow  Psychomotor Activity:  NA  Concentration:  Concentration: Kara Hall and Attention Span: Kara Hall  Recall:  Good  Fund of Knowledge:  Good  Language:  Good  Akathisia:  No  Handed:  Right  AIMS (if indicated):     Assets:  Communication Skills Desire for Improvement Housing Talents/Skills Transportation  ADL's:  Intact  Cognition:  WNL  Sleep:   poor      Assessment and Plan: Major depressive disorder, recurrent.  Anxiety.  Panic attacks.  I review psychosocial stressors.  She is on Risperdal 0.5 mg and I recommend to try 1 mg.  I will also add low-dose hydroxyzine 10-20 mg to take as needed for anxiety and insomnia.  Continue Wellbutrin XL 300 mg in the morning and Xanax 0.5 mg for severe panic attack.  We will provide 10 tablets.  Reassurance given about upcoming colonoscopy.  Discussed medication side effects and benefits.  Recommend to call us back if she has any question or any concern.  Follow-up in 4 to 5 weeks.  Patient like to have a follow-up before Christmas.  Follow Up  Instructions:    I discussed the assessment and treatment plan with the patient. The patient was provided an opportunity to ask questions and all were answered. The patient agreed with the plan and demonstrated an understanding of the instructions.   The patient was advised to call back or seek an in-person evaluation if the symptoms worsen or if the condition fails to improve as anticipated.  Collaboration of Care: Other provider involved in patient's care AEB notes are available in epic to review.  Patient/Guardian was advised Release of Information must be obtained prior to any record release in order to collaborate their care with an outside provider. Patient/Guardian was advised if they have not already done so to contact the registration department to sign all necessary forms in order for [place] to release information regarding their care.   Consent: Patient/Guardian gives verbal consent for treatment and assignment of benefits for services provided during this visit. Patient/Guardian expressed understanding and agreed to proceed.    I provided 22 minutes of non-face-to-face time during this encounter.   Kathlee Nations, MD

## 2021-11-07 NOTE — Telephone Encounter (Signed)
IC and discussed with patient. She will call us next week to schedule once she knows her work schedule.

## 2021-11-07 NOTE — Telephone Encounter (Signed)
Pt called requesting a refill of lyrica and tramadol. Please send to pharmacy on file. Pt phone number is [phone].

## 2021-11-08 ENCOUNTER — Other Ambulatory Visit: Payer: Self-pay | Admitting: Internal Medicine

## 2021-11-08 DIAGNOSIS — E782 Mixed hyperlipidemia: Secondary | ICD-10-CM

## 2021-11-22 ENCOUNTER — Encounter: Payer: Self-pay | Admitting: Gastroenterology

## 2021-11-29 ENCOUNTER — Encounter: Payer: Self-pay | Admitting: Gastroenterology

## 2021-11-29 ENCOUNTER — Ambulatory Visit (AMBULATORY_SURGERY_CENTER): Payer: Commercial Managed Care - HMO | Admitting: Gastroenterology

## 2021-11-29 VITALS — BP 115/77 | HR 59 | Temp 96.6°F | Resp 15 | Ht 66.0 in | Wt 203.0 lb

## 2021-11-29 DIAGNOSIS — D122 Benign neoplasm of ascending colon: Secondary | ICD-10-CM | POA: Diagnosis not present

## 2021-11-29 DIAGNOSIS — K635 Polyp of colon: Secondary | ICD-10-CM | POA: Diagnosis not present

## 2021-11-29 DIAGNOSIS — Z1211 Encounter for screening for malignant neoplasm of colon: Secondary | ICD-10-CM

## 2021-11-29 MED ORDER — SODIUM CHLORIDE 0.9 % IV SOLN: 500.0000 mL | Freq: Once | INTRAVENOUS | Status: DC

## 2021-11-29 NOTE — Progress Notes (Signed)
Called to room to assist during endoscopic procedure.  Patient ID and intended procedure confirmed with present staff. Received instructions for my participation in the procedure from the performing physician.  

## 2021-11-29 NOTE — Op Note (Signed)
[facility] Patient Name: Kara Hall Procedure Date: 11/29/2021 9:17 AM MRN: [id number] Endoscopist: Mauri Pole , MD, [id number] Age: 51 Referring MD:  Date of Birth: 1970-10-16 Gender: Female Account #: [id number] Procedure:                Colonoscopy Indications:              Screening for colorectal malignant neoplasm Medicines:                Monitored Anesthesia Care Procedure:                Pre-Anesthesia Assessment:                           - Prior to the procedure, a History and Physical                            was performed, and patient medications and                            allergies were reviewed. The patient's tolerance of                            previous anesthesia was also reviewed. The risks                            and benefits of the procedure and the sedation                            options and risks were discussed with the patient.                            All questions were answered, and informed consent                            was obtained. Prior Anticoagulants: The patient has                            taken no anticoagulant or antiplatelet agents. ASA                            Grade Assessment: II - A patient with mild systemic                            disease. After reviewing the risks and benefits,                            the patient was deemed in satisfactory condition to                            undergo the procedure.                           After obtaining informed consent, the colonoscope  was passed under direct vision. Throughout the                            procedure, the patient's blood pressure, pulse, and                            oxygen saturations were monitored continuously. The                            PCF-HQ190L Colonoscope was introduced through the                            anus and advanced to the the cecum, identified by                             appendiceal orifice and ileocecal valve. The                            colonoscopy was performed without difficulty. The                            patient tolerated the procedure well. The quality                            of the bowel preparation was excellent. The                            ileocecal valve, appendiceal orifice, and rectum                            were photographed. Scope In: 9:23:56 AM Scope Out: 9:42:41 AM Scope Withdrawal Time: 0 hours 12 minutes 48 seconds  Total Procedure Duration: 0 hours 18 minutes 45 seconds  Findings:                 The perianal and digital rectal examinations were                            normal.                           A 3 mm polyp was found in the ascending colon. The                            polyp was sessile. The polyp was removed with a                            piecemeal technique using a cold biopsy forceps.                            Resection and retrieval were complete.                           A few small-mouthed diverticula were found in the  sigmoid colon.                           Non-bleeding external and internal hemorrhoids were                            found during retroflexion. The hemorrhoids were                            small. Complications:            No immediate complications. Estimated Blood Loss:     Estimated blood loss was minimal. Impression:               - One 3 mm polyp in the ascending colon, removed                            piecemeal using a cold biopsy forceps. Resected and                            retrieved.                           - Diverticulosis in the sigmoid colon.                           - Non-bleeding external and internal hemorrhoids. Recommendation:           - Patient has a contact number available for                            emergencies. The signs and symptoms of potential                            delayed complications were discussed with  the                            patient. Return to normal activities tomorrow.                            Written discharge instructions were provided to the                            patient.                           - Resume previous diet.                           - Continue present medications.                           - Await pathology results.                           - Repeat colonoscopy in 5-10 years for surveillance  based on pathology results. Mauri Pole, MD 11/29/2021 9:49:48 AM This report has been signed electronically.

## 2021-11-29 NOTE — Progress Notes (Signed)
Report to PACU, RN, vss, BBS= Clear.  

## 2021-11-29 NOTE — Patient Instructions (Signed)
Handouts provided on polyps, diverticulosis and hemorrhoids.   Resume previous diet. Continue present medications.  Await pathology results.  Repeat colonoscopy in 5-10 years for surveillance based on pathology results.   YOU HAD AN ENDOSCOPIC PROCEDURE TODAY AT [facility]:   Refer to the procedure report that was given to you for any specific questions about what was found during the examination.  If the procedure report does not answer your questions, please call your gastroenterologist to clarify.  If you requested that your care partner not be given the details of your procedure findings, then the procedure report has been included in a sealed envelope for you to review at your convenience later.  YOU SHOULD EXPECT: Some feelings of bloating in the abdomen. Passage of more gas than usual.  Walking can help get rid of the air that was put into your GI tract during the procedure and reduce the bloating. If you had a lower endoscopy (such as a colonoscopy or flexible sigmoidoscopy) you may notice spotting of blood in your stool or on the toilet paper. If you underwent a bowel prep for your procedure, you may not have a normal bowel movement for a few days.  Please Note:  You might notice some irritation and congestion in your nose or some drainage.  This is from the oxygen used during your procedure.  There is no need for concern and it should clear up in a day or so.  SYMPTOMS TO REPORT IMMEDIATELY:  Following lower endoscopy (colonoscopy or flexible sigmoidoscopy):  Excessive amounts of blood in the stool  Significant tenderness or worsening of abdominal pains  Swelling of the abdomen that is new, acute  Fever of 100F or higher  For urgent or emergent issues, a gastroenterologist can be reached at any hour by calling [phone]. Do not use MyChart messaging for urgent concerns.    DIET:  We do recommend a small meal at first, but then you may proceed to your regular  diet.  Drink plenty of fluids but you should avoid alcoholic beverages for 24 hours.  ACTIVITY:  You should plan to take it easy for the rest of today and you should NOT DRIVE or use heavy machinery until tomorrow (because of the sedation medicines used during the test).    FOLLOW UP: Our staff will call the number listed on your records the next business day following your procedure.  We will call around 7:15- 8:00 am to check on you and address any questions or concerns that you may have regarding the information given to you following your procedure. If we do not reach you, we will leave a message.     If any biopsies were taken you will be contacted by phone or by letter within the next 1-3 weeks.  Please call us at [phone] if you have not heard about the biopsies in 3 weeks.    SIGNATURES/CONFIDENTIALITY: You and/or your care partner have signed paperwork which will be entered into your electronic medical record.  These signatures attest to the fact that that the information above on your After Visit Summary has been reviewed and is understood.  Full responsibility of the confidentiality of this discharge information lies with you and/or your care-partner.

## 2021-11-29 NOTE — Progress Notes (Signed)
[facility] History and Physical   Primary Care Physician:  Ladell Pier, MD   Reason for Procedure:  Colorectal cancer screening  Plan:    Screening colonoscopy with possible interventions as needed     HPI: Kara Hall is a very pleasant 51 y.o. female here for screening colonoscopy. Denies any nausea, vomiting, abdominal pain, melena or bright red blood per rectum  The risks and benefits as well as alternatives of endoscopic procedure(s) have been discussed and reviewed. All questions answered. The patient agrees to proceed.    Past Medical History:  Diagnosis Date   Anemia    Anxiety    panic attack, also reports problems with depression, relative to her job & upcoming surgery , relative to life problems - with children & spouse     Back complaints 2013   has had steroid injection    Depression    DVT (deep venous thrombosis) ([place]) 2001   RLE   Fibroids    Foot fracture, left    HTN (hypertension)    Migraine    h/o migraines - as a teenager  & again now with family issues, states she has to lay down & rest in a dark room  (08/28/2013)   Muscle strain of hand    wearing home made splint today, L hand   Peripheral vascular disease ([place])    DVT- per pt. in 2001- had vein removed.    Refusal of blood transfusions as patient is Jehovah's Witness    Thyroid cancer ([place])    Thyroid nodule     Past Surgical History:  Procedure Laterality Date   CYSTOSCOPY N/A 12/18/2018   Procedure: CYSTOSCOPY;  Surgeon: Jonelle Sidle, MD;  Location: [place];  Service: Gynecology;  Laterality: N/A;   LAPAROSCOPIC LYSIS OF ADHESIONS N/A 12/18/2018   Procedure: LAPAROSCOPIC LYSIS OF ADHESIONS;  Surgeon: Jonelle Sidle, MD;  Location: [place];  Service: Gynecology;  Laterality: N/A;   ROBOTIC ASSISTED LAPAROSCOPIC HYSTERECTOMY AND SALPINGECTOMY Bilateral 12/18/2018   Procedure: XI ROBOTIC ASSISTED LAPAROSCOPIC  HYSTERECTOMY AND BILATERAL SALPINGECTOMY;  Surgeon: Jonelle Sidle, MD;  Location: [place];  Service: Gynecology;  Laterality: Bilateral;   THROMBECTOMY Right 2001   leg; after 3rd child was born; for deep vein thrombosis   THYROIDECTOMY Right 08/28/2013   Procedure: RIGHT THYROID LOBECTOMY POSSIBLE TOTAL THYROIDECTOMY;  Surgeon: Jodi Marble, MD;  Location: [place];  Service: ENT;  Laterality: Right;   THYROIDECTOMY Left 09/10/2013   Procedure: COMPLETE LEFT THYROIDECTOMY;  Surgeon: Jodi Marble, MD;  Location: [place];  Service: ENT;  Laterality: Left;   TUBAL LIGATION  2001    Prior to Admission medications   Medication Sig Start Date End Date Taking? Authorizing Provider  ALPRAZolam [name]) 0.5 MG tablet Take 0.5-1 tablets (0.25-0.5 mg total) by mouth daily as needed for anxiety. 11/07/21  Yes Arfeen, Arlyce Harman, MD  amLODipine (NORVASC) 5 MG tablet Take 1 tablet (5 mg total) by mouth daily. 10/16/21  Yes Ladell Pier, MD  buPROPion (WELLBUTRIN XL) 300 MG 24 hr tablet Take 1 tablet (300 mg total) by mouth daily. 11/07/21  Yes Arfeen, Arlyce Harman, MD  levothyroxine (SYNTHROID, LEVOTHROID) 125 MCG tablet Take 125 mcg by mouth daily before breakfast.    Yes [provider]  loratadine (CLARITIN) 10 MG tablet Take 1 tablet (10 mg total) by mouth at bedtime. 10/16/21  Yes Ladell Pier, MD  omeprazole (PRILOSEC) 20 MG capsule  TAKE 1 CAPSULE(20 MG) BY MOUTH DAILY 10/19/21  Yes Ladell Pier, MD  pravastatin (PRAVACHOL) 10 MG tablet TAKE 1 TABLET BY MOUTH EVERY DAY EVERY MONDAY, [place], FRIDAY 11/08/21  Yes Ladell Pier, MD  propranolol (INDERAL) 10 MG tablet Take 1 tablet (10 mg total) by mouth 2 (two) times daily. 10/16/21  Yes Ladell Pier, MD  valsartan (DIOVAN) 40 MG tablet TAKE 1 TABLET(40 MG) BY MOUTH DAILY 10/16/21  Yes Ladell Pier, MD  butalbital-acetaminophen-caffeine (FIORICET) [phone] MG tablet TAKE 1 TO 2 TABLETS BY MOUTH DAILY  AS NEEDED 10/16/21   Ladell Pier, MD  fluticasone (FLONASE) 50 MCG/ACT nasal spray Place 2 sprays into both nostrils daily. Patient not taking: Reported on 11/01/2021 04/26/21   Ladell Pier, MD  hydrOXYzine (ATARAX) 10 MG tablet Take 1-2 tablets (10-20 mg total) by mouth at bedtime as needed and may repeat dose one time if needed for anxiety. 11/07/21   Arfeen, Arlyce Harman, MD  lidocaine (XYLOCAINE) 2 % solution Use as directed 15 mLs in the mouth or throat every 3 (three) hours as needed for mouth pain (Sore throat). Patient not taking: Reported on 10/16/2021 09/02/21   Lynden Oxford Scales, PA-C  risperiDONE (RISPERDAL) 1 MG tablet Take 1 tablet (1 mg total) by mouth at bedtime. 11/07/21 11/07/22  Arfeen, Arlyce Harman, MD  SUMAtriptan (IMITREX) 50 MG tablet 1 tab at the start of headache.  May repeat in 2 hours if no relief.  Max 2 tabs per 24-hour. 03/06/21   Ladell Pier, MD  traMADol (ULTRAM) 50 MG tablet Take 1 tablet (50 mg total) by mouth every 8 (eight) hours as needed for moderate pain or severe pain. 08/28/21 08/28/22  Lorine Bears, NP    Current Outpatient Medications  Medication Sig Dispense Refill   ALPRAZolam (XANAX) 0.5 MG tablet Take 0.5-1 tablets (0.25-0.5 mg total) by mouth daily as needed for anxiety. 10 tablet 0   amLODipine (NORVASC) 5 MG tablet Take 1 tablet (5 mg total) by mouth daily. 90 tablet 1   buPROPion (WELLBUTRIN XL) 300 MG 24 hr tablet Take 1 tablet (300 mg total) by mouth daily. 30 tablet 1   levothyroxine (SYNTHROID, LEVOTHROID) 125 MCG tablet Take 125 mcg by mouth daily before breakfast.      loratadine (CLARITIN) 10 MG tablet Take 1 tablet (10 mg total) by mouth at bedtime. 90 tablet 1   omeprazole (PRILOSEC) 20 MG capsule TAKE 1 CAPSULE(20 MG) BY MOUTH DAILY 90 capsule 0   pravastatin (PRAVACHOL) 10 MG tablet TAKE 1 TABLET BY MOUTH EVERY DAY EVERY MONDAY, WEDNESDAY, FRIDAY 38 tablet 1   propranolol (INDERAL) 10 MG tablet Take 1 tablet (10 mg total)  by mouth 2 (two) times daily. 180 tablet 1   valsartan (DIOVAN) 40 MG tablet TAKE 1 TABLET(40 MG) BY MOUTH DAILY 90 tablet 1   butalbital-acetaminophen-caffeine (FIORICET) 50-325-40 MG tablet TAKE 1 TO 2 TABLETS BY MOUTH DAILY AS NEEDED 15 tablet 1   fluticasone (FLONASE) 50 MCG/ACT nasal spray Place 2 sprays into both nostrils daily. (Patient not taking: Reported on 11/01/2021) 16 g 1   hydrOXYzine (ATARAX) 10 MG tablet Take 1-2 tablets (10-20 mg total) by mouth at bedtime as needed and may repeat dose one time if needed for anxiety. 30 tablet 0   lidocaine (XYLOCAINE) 2 % solution Use as directed 15 mLs in the mouth or throat every 3 (three) hours as needed for mouth pain (Sore throat). (Patient not taking: Reported  on 10/16/2021) 300 mL 0   risperiDONE (RISPERDAL) 1 MG tablet Take 1 tablet (1 mg total) by mouth at bedtime. 30 tablet 1   SUMAtriptan (IMITREX) 50 MG tablet 1 tab at the start of headache.  May repeat in 2 hours if no relief.  Max 2 tabs per 24-hour. 10 tablet 1   traMADol (ULTRAM) 50 MG tablet Take 1 tablet (50 mg total) by mouth every 8 (eight) hours as needed for moderate pain or severe pain. 25 tablet 0   Current Facility-Administered Medications  Medication Dose Route Frequency Provider Last Rate Last Admin   0.9 %  sodium chloride infusion  500 mL Intravenous Once Klaira Pesci V, MD       triamcinolone acetonide (KENALOG) 10 MG/ML injection 10 mg  10 mg Other Once Landis Martins, DPM        Allergies as of 11/29/2021 - Review Complete 11/29/2021  Allergen Reaction Noted   Other  12/09/2018   Sulfa antibiotics Other (See Comments) and Nausea And Vomiting 07/12/2010   Trazodone and nefazodone Other (See Comments) 05/17/2011    Family History  Problem Relation Age of Onset   Heart disease Mother    Hyperlipidemia Mother    Heart disease Father    Hyperlipidemia Father    Diabetes Father    Heart disease Brother    Hyperlipidemia Brother    Hypertension Brother     Heart disease Brother    Hyperlipidemia Brother    Colon cancer Maternal Grandfather    Esophageal cancer Neg Hx    Stomach cancer Neg Hx    Rectal cancer Neg Hx     Social History   Socioeconomic History   Marital status: Married    Spouse name: [name]   Number of children: 3   Years of education: 12+   Highest education level: Not on file  Occupational History   Occupation: [employer]   Occupation: Cosmetologist    Employer: [employer]  Tobacco Use   Smoking status: Never   Smokeless tobacco: Never  Vaping Use   Vaping Use: Never used  Substance and Sexual Activity   Alcohol use: Yes    Comment: occ   Drug use: No   Sexual activity: Yes    Partners: Male    Birth control/protection: Surgical    Comment: Tubal ligation  Other Topics Concern   Not on file  Social History Narrative   Divorce from previous husband in 06/2016.   Now remarried 07/2016.   Lives at home with her new husband and cat.   She has three grown children who live with her husband. She has a good relationship with her children.   Her father passed away May 05, 2016.   Social Determinants of Health   Financial Resource Strain: Not on file  Food Insecurity: Not on file  Transportation Needs: Not on file  Physical Activity: Not on file  Stress: Not on file  Social Connections: Not on file  Intimate Partner Violence: Not on file    Review of Systems:  All other review of systems negative except as mentioned in the HPI.  Physical Exam: Vital signs in last 24 hours: Blood Pressure 127/74   Pulse 76   Temperature (Abnormal) 96.6 F (35.9 C) (Temporal)   Height '5\' 6"'$  (1.676 m)   Weight 203 lb (92.1 kg)   Last Menstrual Period 01/08/2019   Oxygen Saturation 97%   Body Mass Index 32.77 kg/m  General:   Alert, NAD Lungs:  Clear .   Heart:  Regular rate and rhythm Abdomen:  Soft, nontender and nondistended. Neuro/Psych:  Alert and cooperative. Normal mood and affect. A and O x 3  Reviewed  labs, radiology imaging, old records and pertinent past GI work up  Patient is appropriate for planned procedure(s) and anesthesia in an ambulatory setting   K. Denzil Magnuson , MD [phone]

## 2021-11-30 ENCOUNTER — Telehealth: Payer: Self-pay

## 2021-11-30 NOTE — Telephone Encounter (Signed)
  Follow up Call-     11/29/2021    8:18 AM  Call back number  Post procedure Call Back phone  # [phone]  Permission to leave phone message Yes     Patient questions:  Do you have a fever, pain , or abdominal swelling? No. Pain Score  0 *  Have you tolerated food without any problems? Yes.    Have you been able to return to your normal activities? Yes.    Do you have any questions about your discharge instructions: Diet   No. Medications  No. Follow up visit  No.  Do you have questions or concerns about your Care? No.  Actions: * If pain score is 4 or above: No action needed, pain <4.

## 2021-12-13 ENCOUNTER — Telehealth (HOSPITAL_BASED_OUTPATIENT_CLINIC_OR_DEPARTMENT_OTHER): Payer: Commercial Managed Care - HMO | Admitting: Psychiatry

## 2021-12-13 ENCOUNTER — Encounter (HOSPITAL_COMMUNITY): Payer: Self-pay | Admitting: Psychiatry

## 2021-12-13 VITALS — Wt 203.0 lb

## 2021-12-13 DIAGNOSIS — F331 Major depressive disorder, recurrent, moderate: Secondary | ICD-10-CM | POA: Diagnosis not present

## 2021-12-13 DIAGNOSIS — F419 Anxiety disorder, unspecified: Secondary | ICD-10-CM

## 2021-12-13 MED ORDER — GABAPENTIN 100 MG PO CAPS: 100.0000 mg | ORAL_CAPSULE | Freq: Two times a day (BID) | ORAL | 2 refills | Status: DC

## 2021-12-13 MED ORDER — RISPERIDONE 1 MG PO TABS: 1.0000 mg | ORAL_TABLET | Freq: Every day | ORAL | 2 refills | Status: DC

## 2021-12-13 MED ORDER — BUPROPION HCL ER (XL) 300 MG PO TB24: 300.0000 mg | ORAL_TABLET | Freq: Every day | ORAL | 2 refills | Status: DC

## 2021-12-13 NOTE — Progress Notes (Signed)
Virtual Visit via Telephone Note  I connected with Kara Hall on 12/13/21 at 11:40 AM EST by telephone and verified that I am speaking with the correct person using two identifiers.  Location: Patient: In Car Provider: Home Office   I discussed the limitations, risks, security and privacy concerns of performing an evaluation and management service by telephone and the availability of in person appointments. I also discussed with the patient that there may be a patient responsible charge related to this service. The patient expressed understanding and agreed to proceed.   History of Present Illness: Patient is evaluated by phone session.  She admitted not taking the Risperdal regularly because when she come back from work she is tired and sometimes forgetful.  However she did remember it helps her depression and sleep.  She has one major panic attack that she required to take the Xanax.  She had recently colonoscopy and that went well and she took the Xanax before and she feels more relaxed.  Her biggest challenge is her job.  She works for a [employer] and it is very busy around holidays.  She is working full-time and there are not enough employees and she always feels overwhelmed and stressed.  Patient reported continued to have arguments with her husband and he does not understand her emotions.  Patient reported crying spells, irritability and sometimes hopelessness but denies any suicidal thoughts.  She reported a lot of things happening in coming weeks.  Her daughter is pregnant and due on Valentine's Day and she going to have a baby shower in January.  She also looking for a bigger place and so far it has been challenging.  She has to move in January.  She had thought about switching her job but she feels the time is not good and does not want to quit because she need money.  Due to her busy schedule not able to see a therapist.  During the session she was tearful.  She denies  any hallucination, paranoia.  She is looking forward to have a grandchild in February.  Her appetite is okay.  Her weight is unchanged from the past.  Occasionally she takes hydroxyzine that helps her sleep but next day she feels very tired.  She denies drinking or using any illegal substances.   Past Psychiatric History: Tried lexapro from psychiatrist made her hyper.  Allergic to Trazodone and nefazodone.  Headache from Abilify. H/O impulsive buying and excessive spending. No history of suicidal attempt, psychosis and inpatient treatment.  Psychiatric Specialty Exam: Physical Exam  Review of Systems  Weight 203 lb (92.1 kg), last menstrual period 01/08/2019.There is no height or weight on file to calculate BMI.  General Appearance: NA  Eye Contact:  NA  Speech:  Slow  Volume:  Decreased  Mood:  Dysphoric  Affect:  NA  Thought Process:  Descriptions of Associations: Intact  Orientation:  Full (Time, Place, and Person)  Thought Content:  Rumination  Suicidal Thoughts:  No  Homicidal Thoughts:  No  Memory:  Immediate;   Good Recent;   Good Remote;   Good  Judgement:  Hall  Insight:  Shallow  Psychomotor Activity:  NA  Concentration:  Concentration: Hall and Attention Span: Hall  Recall:  Good  Fund of Knowledge:  Good  Language:  Good  Akathisia:  No  Handed:  Right  AIMS (if indicated):     Assets:  Communication Skills Desire for Improvement Housing Transportation  ADL's:  Intact  Cognition:  WNL  Sleep:         Assessment and Plan: Major depressive disorder, recurrent.  Panic attacks.  I review psychosocial stressors.  Patient has chronic issues but her biggest challenge is work.  She feels that she cannot do anything because job is very busy.  She has to plan her daughter's baby shower who live in [place].  She also looking for a new place to live and so far has not find anything.  Patient reported financial strain because co-pays are higher while she is trying to  save money.  She also reported marital issues.  She admitted not taking the Risperdal regularly but taking Wellbutrin every day.  I encouraged her to take the Risperdal 1 mg every night since it had helped her but sometimes she forgets.  I encourage to have a alarm so she do not miss the medicine.  I recommended she take the Risperdal every night and then she may not need hydroxyzine.  Patient agreed with the plan.  She is still like something during the day so she can remain calm while working.  I recommend she can try gabapentin 100 mg up to 2 times a day and she can take with the Wellbutrin XL 300 mg in the morning.  She still had a few Xanax left and I recommend should take when she has major panic attack.  She understands the dependency issue and still have 9 pills left from the last prescription.  She like to have a follow-up in 3 months.  I discussed medication side effects and benefits and is strongly encouraged if symptoms do not improve then she can call us back.  Follow-up in 3 months.  Follow Up Instructions:    I discussed the assessment and treatment plan with the patient. The patient was provided an opportunity to ask questions and all were answered. The patient agreed with the plan and demonstrated an understanding of the instructions.   The patient was advised to call back or seek an in-person evaluation if the symptoms worsen or if the condition fails to improve as anticipated.  Collaboration of Care: Other provider involved in patient's care AEB notes are available in epic to review.  Patient/Guardian was advised Release of Information must be obtained prior to any record release in order to collaborate their care with an outside provider. Patient/Guardian was advised if they have not already done so to contact the registration department to sign all necessary forms in order for [place] to release information regarding their care.   Consent: Patient/Guardian gives verbal consent for  treatment and assignment of benefits for services provided during this visit. Patient/Guardian expressed understanding and agreed to proceed.    I provided 31 minutes of non-face-to-face time during this encounter.   Kathlee Nations, MD

## 2021-12-21 ENCOUNTER — Encounter: Payer: Self-pay | Admitting: Gastroenterology

## 2022-01-10 ENCOUNTER — Encounter (HOSPITAL_COMMUNITY): Payer: Self-pay

## 2022-01-10 ENCOUNTER — Ambulatory Visit (HOSPITAL_COMMUNITY)
Admission: EM | Admit: 2022-01-10 | Discharge: 2022-01-10 | Disposition: A | Discharge status: Home / Self Care | Payer: Commercial Managed Care - HMO | Attending: Internal Medicine | Admitting: Internal Medicine

## 2022-01-10 DIAGNOSIS — R051 Acute cough: Secondary | ICD-10-CM

## 2022-01-10 DIAGNOSIS — J40 Bronchitis, not specified as acute or chronic: Secondary | ICD-10-CM | POA: Diagnosis not present

## 2022-01-10 DIAGNOSIS — J329 Chronic sinusitis, unspecified: Secondary | ICD-10-CM | POA: Diagnosis not present

## 2022-01-10 DIAGNOSIS — R0981 Nasal congestion: Secondary | ICD-10-CM

## 2022-01-10 MED ORDER — BENZONATATE 100 MG PO CAPS: 100.0000 mg | ORAL_CAPSULE | Freq: Three times a day (TID) | ORAL | 0 refills | Status: DC

## 2022-01-10 MED ORDER — PREDNISONE 20 MG PO TABS: 40.0000 mg | ORAL_TABLET | Freq: Every day | ORAL | 0 refills | Status: AC

## 2022-01-10 MED ORDER — AMOXICILLIN-POT CLAVULANATE 875-125 MG PO TABS: 1.0000 | ORAL_TABLET | Freq: Two times a day (BID) | ORAL | 0 refills | Status: DC

## 2022-01-10 NOTE — ED Triage Notes (Signed)
Pt is here for cough, sneezing SOB, wheezing, NASAL congestion, runny nose, sore throat, back pain, low energy, loss of appetite ,fever, headache . Ibuprofen taken unknown what time.tylenol taken at 8am today . Pt also mentioned she had diarrhea this morning

## 2022-01-10 NOTE — Discharge Instructions (Addendum)
You have bronchitis which is inflammation of the upper airways in your lungs. You also have a sinus infection.  Take Augmentin antibiotic every 12 hours for the next 7 days to treat sinus infection.  Take prednisone once daily for the next 5 days with breakfast starting tomorrow morning.  Do not take any other NSAID containing medications such as ibuprofen or naproxen/Aleve while taking prednisone.  If you develop any new or worsening symptoms or do not improve in the next 2 to 3 days, please return.  If your symptoms are severe, please go to the emergency room.  Follow-up with your primary care provider for further evaluation and management of your symptoms as well as ongoing wellness visits.  I hope you feel better!

## 2022-01-10 NOTE — ED Provider Notes (Signed)
[employer]    CSN: [id number] Arrival date & time: 01/10/22  1144      History   Chief Complaint Chief Complaint  Patient presents with   Cough   Shortness of Breath   Back Pain    HPI Kara Hall is a 51 y.o. female.   Patient presents urgent care for evaluation of cough, nasal congestion, sore throat, fever/chills, and generalized fatigue for the last approximately 7 to 8 days.  Symptoms initially started with nasal congestion and productive cough with green/yellow sputum but then improved slightly with use of over-the-counter medicines.  Symptoms worsened 2 days ago when fever returned and nasal congestion/cough became more persistent.  She is not a smoker and denies drug use.  Denies history of chronic respiratory problems.  Denies body aches, headache, vision changes, nausea, vomiting, abdominal pain, flank pain, and shortness of breath/chest pain.  She did have 1 episode of diarrhea this morning without blood or mucus in the stools.  No known sick contacts with similar symptoms.  Patient works at [employer] and has been working long hours over the holiday weekend and has not been able to rest or recover from illness appropriately.  She is vaccinated against COVID-19 and influenza.   Cough Associated symptoms: shortness of breath   Shortness of Breath Associated symptoms: cough   Back Pain   Past Medical History:  Diagnosis Date   Anemia    Anxiety    panic attack, also reports problems with depression, relative to her job & upcoming surgery , relative to life problems - with children & spouse     Back complaints 2013   has had steroid injection    Depression    DVT (deep venous thrombosis) ([place]) 2001   RLE   Fibroids    Foot fracture, left    HTN (hypertension)    Migraine    h/o migraines - as a teenager  & again now with family issues, states she has to lay down & rest in a dark room  (08/28/2013)   Muscle strain of hand     wearing home made splint today, L hand   Peripheral vascular disease ([place])    DVT- per pt. in 2001- had vein removed.    Refusal of blood transfusions as patient is Jehovah's Witness    Thyroid cancer ([place])    Thyroid nodule     Patient Active Problem List   Diagnosis Date Noted   Mixed hyperlipidemia 08/19/2020   Obesity (BMI 30.0-34.9) 08/19/2020   Stressful life event affecting family 08/19/2020   Plantar fasciitis, bilateral 08/19/2020   Status post hysterectomy 12/18/2018   Prolonged Q-T interval on ECG 11/24/2018   Essential hypertension 11/24/2018   Dyspareunia in female 03/24/2018   Increased body mass index 03/24/2018   Carpal tunnel syndrome on both sides 12/10/2017   Achilles tendon contracture, left 10/04/2016   Displaced fracture of fifth metatarsal bone of left foot with delayed healing 05/07/2016   Bilateral wrist pain 11/28/2015   History of thyroid cancer 08/09/2013   Anxiety 01/12/2013    Past Surgical History:  Procedure Laterality Date   CYSTOSCOPY N/A 12/18/2018   Procedure: CYSTOSCOPY;  Surgeon: Jonelle Sidle, MD;  Location: [facility];  Service: Gynecology;  Laterality: N/A;   LAPAROSCOPIC LYSIS OF ADHESIONS N/A 12/18/2018   Procedure: LAPAROSCOPIC LYSIS OF ADHESIONS;  Surgeon: Jonelle Sidle, MD;  Location: [facility];  Service: Gynecology;  Laterality: N/A;  ROBOTIC ASSISTED LAPAROSCOPIC HYSTERECTOMY AND SALPINGECTOMY Bilateral 12/18/2018   Procedure: XI ROBOTIC ASSISTED LAPAROSCOPIC HYSTERECTOMY AND BILATERAL SALPINGECTOMY;  Surgeon: Jonelle Sidle, MD;  Location: [facility];  Service: Gynecology;  Laterality: Bilateral;   THROMBECTOMY Right 2001   leg; after 3rd child was born; for deep vein thrombosis   THYROIDECTOMY Right 08/28/2013   Procedure: RIGHT THYROID LOBECTOMY POSSIBLE TOTAL THYROIDECTOMY;  Surgeon: Jodi Marble, MD;  Location: [facility];  Service: ENT;  Laterality: Right;    THYROIDECTOMY Left 09/10/2013   Procedure: COMPLETE LEFT THYROIDECTOMY;  Surgeon: Jodi Marble, MD;  Location: [facility];  Service: ENT;  Laterality: Left;   TUBAL LIGATION  2001    OB History   No obstetric history on file.      Home Medications    Prior to Admission medications   Medication Sig Start Date End Date Taking? Authorizing Provider  amoxicillin-clavulanate (AUGMENTIN) 875-125 MG tablet Take 1 tablet by mouth every 12 (twelve) hours. 01/10/22  Yes Talbot Grumbling, FNP  benzonatate (TESSALON) 100 MG capsule Take 1 capsule (100 mg total) by mouth every 8 (eight) hours. 01/10/22  Yes Talbot Grumbling, FNP  predniSONE (DELTASONE) 20 MG tablet Take 2 tablets (40 mg total) by mouth daily for 7 days. 01/10/22 01/17/22 Yes Gilda Abboud, Stasia Cavalier, FNP  ALPRAZolam [name]) 0.5 MG tablet Take 0.5-1 tablets (0.25-0.5 mg total) by mouth daily as needed for anxiety. 11/07/21   Arfeen, Arlyce Harman, MD  amLODipine (NORVASC) 5 MG tablet Take 1 tablet (5 mg total) by mouth daily. 10/16/21   Ladell Pier, MD  buPROPion (WELLBUTRIN XL) 300 MG 24 hr tablet Take 1 tablet (300 mg total) by mouth daily. 12/13/21   Arfeen, Arlyce Harman, MD  butalbital-acetaminophen-caffeine (FIORICET) [phone] MG tablet TAKE 1 TO 2 TABLETS BY MOUTH DAILY AS NEEDED 10/16/21   Ladell Pier, MD  fluticasone [facility]) 50 MCG/ACT nasal spray Place 2 sprays into both nostrils daily. Patient not taking: Reported on 11/01/2021 04/26/21   Ladell Pier, MD  gabapentin (NEURONTIN) 100 MG capsule Take 1 capsule (100 mg total) by mouth 2 (two) times daily. 12/13/21 12/13/22  Arfeen, Arlyce Harman, MD  hydrOXYzine (ATARAX) 10 MG tablet Take 1-2 tablets (10-20 mg total) by mouth at bedtime as needed and may repeat dose one time if needed for anxiety. 11/07/21   Arfeen, Arlyce Harman, MD  levothyroxine (SYNTHROID, LEVOTHROID) 125 MCG tablet Take 125 mcg by mouth daily before breakfast.     [provider]  lidocaine (XYLOCAINE)  2 % solution Use as directed 15 mLs in the mouth or throat every 3 (three) hours as needed for mouth pain (Sore throat). Patient not taking: Reported on 10/16/2021 09/02/21   Lynden Oxford Scales, PA-C  loratadine (CLARITIN) 10 MG tablet Take 1 tablet (10 mg total) by mouth at bedtime. 10/16/21   Ladell Pier, MD  omeprazole (PRILOSEC) 20 MG capsule TAKE 1 CAPSULE(20 MG) BY MOUTH DAILY 10/19/21   Ladell Pier, MD  pravastatin (PRAVACHOL) 10 MG tablet TAKE 1 TABLET BY MOUTH EVERY DAY EVERY MONDAY, [place], [place] 11/08/21   Ladell Pier, MD  propranolol (INDERAL) 10 MG tablet Take 1 tablet (10 mg total) by mouth 2 (two) times daily. 10/16/21   Ladell Pier, MD  risperiDONE (RISPERDAL) 1 MG tablet Take 1 tablet (1 mg total) by mouth at bedtime. 12/13/21 12/13/22  Arfeen, Arlyce Harman, MD  SUMAtriptan (IMITREX) 50 MG tablet 1 tab at the start of headache.  May repeat in 2 hours if no relief.  Max 2 tabs per 24-hour. 03/06/21   Ladell Pier, MD  traMADol (ULTRAM) 50 MG tablet Take 1 tablet (50 mg total) by mouth every 8 (eight) hours as needed for moderate pain or severe pain. 08/28/21 08/28/22  Lorine Bears, NP  valsartan (DIOVAN) 40 MG tablet TAKE 1 TABLET(40 MG) BY MOUTH DAILY 10/16/21   Ladell Pier, MD    Family History Family History  Problem Relation Age of Onset   Heart disease Mother    Hyperlipidemia Mother    Heart disease Father    Hyperlipidemia Father    Diabetes Father    Heart disease Brother    Hyperlipidemia Brother    Hypertension Brother    Heart disease Brother    Hyperlipidemia Brother    Colon cancer Maternal Grandfather    Esophageal cancer Neg Hx    Stomach cancer Neg Hx    Rectal cancer Neg Hx     Social History Social History   Tobacco Use   Smoking status: Never   Smokeless tobacco: Never  Vaping Use   Vaping Use: Never used  Substance Use Topics   Alcohol use: Yes    Comment: occ   Drug use: No     Allergies    Other, Sulfa antibiotics, and Trazodone and nefazodone   Review of Systems Review of Systems  Respiratory:  Positive for cough and shortness of breath.   Musculoskeletal:  Positive for back pain.   Per HPI  Physical Exam Triage Vital Signs ED Triage Vitals  Enc Vitals Group     BP 01/10/22 1515 109/78     Pulse Rate 01/10/22 1515 81     Resp --      Temp 01/10/22 1515 98.1 F (36.7 C)     Temp Source 01/10/22 1515 Oral     SpO2 01/10/22 1515 96 %     Weight --      Height --      Head Circumference --      Peak Flow --      Pain Score 01/10/22 1514 10     Pain Loc --      Pain Edu? --      Excl. in [place]? --    No data found.  Updated Vital Signs BP 109/78 (BP Location: Right Arm)   Pulse 81   Temp 98.1 F (36.7 C) (Oral)   LMP 01/08/2019   SpO2 96%   Visual Acuity Right Eye Distance:   Left Eye Distance:   Bilateral Distance:    Right Eye Near:   Left Eye Near:    Bilateral Near:     Physical Exam Vitals and nursing note reviewed.  Constitutional:      Appearance: She is not ill-appearing or toxic-appearing.  HENT:     Head: Normocephalic and atraumatic.     Right Ear: Hearing, tympanic membrane, ear canal and external ear normal.     Left Ear: Hearing, tympanic membrane, ear canal and external ear normal.     Nose: Nose normal.     Mouth/Throat:     Lips: Pink.     Mouth: Mucous membranes are moist.     Pharynx: No pharyngeal swelling or oropharyngeal exudate.     Comments: Slight posterior oropharynx erythema without tonsillar exudate or swelling.  Evidence of purulent postnasal drainage to the posterior oropharynx. Eyes:     General: Lids are normal. Vision grossly intact. Gaze  aligned appropriately.     Extraocular Movements: Extraocular movements intact.     Conjunctiva/sclera: Conjunctivae normal.  Cardiovascular:     Rate and Rhythm: Normal rate and regular rhythm.     Heart sounds: Normal heart sounds, S1 normal and S2 normal.  Pulmonary:      Effort: Pulmonary effort is normal. No respiratory distress.     Breath sounds: Normal air entry. Examination of the left-upper field reveals wheezing. Wheezing present.     Comments: Faint expiratory wheeze heard to the left upper lung field.  All other lung fields are clear to auscultation without adventitious sounds.  No respiratory distress. Musculoskeletal:     Cervical back: Normal range of motion and neck supple.  Lymphadenopathy:     Cervical: Cervical adenopathy present.  Skin:    General: Skin is warm and dry.     Capillary Refill: Capillary refill takes less than 2 seconds.     Findings: No rash.  Neurological:     General: No focal deficit present.     Mental Status: She is alert and oriented to person, place, and time. Mental status is at baseline.     Cranial Nerves: No dysarthria or facial asymmetry.  Psychiatric:        Mood and Affect: Mood normal.        Speech: Speech normal.        Behavior: Behavior normal.        Thought Content: Thought content normal.        Judgment: Judgment normal.     UC Treatments / Results  Labs (all labs ordered are listed, but only abnormal results are displayed) Labs Reviewed - No data to display  EKG   Radiology No results found.  Procedures Procedures (including critical care time)  Medications Ordered in UC Medications - No data to display  Initial Impression / Assessment and Plan / UC Course  I have reviewed the triage vital signs and the nursing notes.  Pertinent labs & imaging results that were available during my care of the patient were reviewed by me and considered in my medical decision making (see chart for details).   1. Sinobronchitis Presentation is consistent with acute bacterial sinusitis and bronchitis with acute cough and nasal congestion. Will manage this with Augmentin antibiotic 2 times daily for 7 days based on timing of illness and symptoms for bacterial sinusitis, and prednisone burst once  daily '40mg'$  for 5 days. No other NSAIDs with prednisone due to risk for GI upset/bleeding. May use tylenol 1,'000mg'$  every 6 hours as needed for fever/chills while taking prednisone. Tessalon pearles every 8 hours as needed for cough. Deferred imaging based on stable cardiopulmonary exam and hemodynamically stable vitals in clinic. May use albuterol inhaler 1-2 puffs every 4-6 hours as needed for cough, shortness of breath, and wheeze.  Discussed physical exam and available lab work findings in clinic with patient.  Counseled patient regarding appropriate use of medications and potential side effects for all medications recommended or prescribed today. Discussed red flag signs and symptoms of worsening condition,when to call the PCP office, return to urgent care, and when to seek higher level of care in the emergency department. Patient verbalizes understanding and agreement with plan. All questions answered. Patient discharged in stable condition.    Final Clinical Impressions(s) / UC Diagnoses   Final diagnoses:  Sinobronchitis  Acute cough  Nasal congestion     Discharge Instructions      You have bronchitis which is  inflammation of the upper airways in your lungs. You also have a sinus infection.  Take Augmentin antibiotic every 12 hours for the next 7 days to treat sinus infection.  Take prednisone once daily for the next 5 days with breakfast starting tomorrow morning.  Do not take any other NSAID containing medications such as ibuprofen or naproxen/Aleve while taking prednisone.  If you develop any new or worsening symptoms or do not improve in the next 2 to 3 days, please return.  If your symptoms are severe, please go to the emergency room.  Follow-up with your primary care provider for further evaluation and management of your symptoms as well as ongoing wellness visits.  I hope you feel better!     ED Prescriptions     Medication Sig Dispense Auth. Provider    amoxicillin-clavulanate (AUGMENTIN) 875-125 MG tablet Take 1 tablet by mouth every 12 (twelve) hours. 14 tablet Joella Prince M, FNP   predniSONE (DELTASONE) 20 MG tablet Take 2 tablets (40 mg total) by mouth daily for 7 days. 14 tablet Joella Prince M, FNP   benzonatate (TESSALON) 100 MG capsule Take 1 capsule (100 mg total) by mouth every 8 (eight) hours. 21 capsule Talbot Grumbling, FNP      PDMP not reviewed this encounter.   Talbot Grumbling, [place] 01/13/22 2145

## 2022-01-17 ENCOUNTER — Other Ambulatory Visit: Payer: Self-pay | Admitting: Internal Medicine

## 2022-01-17 DIAGNOSIS — K219 Gastro-esophageal reflux disease without esophagitis: Secondary | ICD-10-CM

## 2022-01-18 ENCOUNTER — Telehealth (HOSPITAL_COMMUNITY): Payer: Self-pay

## 2022-01-18 NOTE — Telephone Encounter (Signed)
Patient's phone call.  She admitted a lot of stress and anxiety.  She also admitted not taking the rest of her dog because first time she opened that she has been drinking alcohol every night before she go to sleep.  She does not want to mix alcohol with the Risperdal.  She reported a lot of stress from work and now she reported her husband also drinks 18 beers at least in 1 week.  She admitted both are alcoholic.  She like to get some help but does not want to do any program or out of work because she cannot afford.  Patient has appointment tomorrow.  I agree to discuss more options on her appointment tomorrow and I also recommend if she feels having any suicidal thoughts and not feel safe then she should call 911 or go to emergency room.  Patient verbalized endocrinologist and will keep the appointment tomorrow.

## 2022-01-18 NOTE — Telephone Encounter (Signed)
Patient came into office upset stating that she is experiencing panic attacks, she feels hopeless she feels that her heart is about to explode  she feel her medication needs to be increased she also stated that she took a xanax earlier today and it made her feel a little more calm she did was able to get an appointment for tomorrow 01/19/22 please advise

## 2022-01-19 ENCOUNTER — Telehealth (HOSPITAL_BASED_OUTPATIENT_CLINIC_OR_DEPARTMENT_OTHER): Payer: Commercial Managed Care - HMO | Admitting: Psychiatry

## 2022-01-19 ENCOUNTER — Encounter (HOSPITAL_COMMUNITY): Payer: Self-pay | Admitting: Psychiatry

## 2022-01-19 VITALS — Wt 203.0 lb

## 2022-01-19 DIAGNOSIS — F1010 Alcohol abuse, uncomplicated: Secondary | ICD-10-CM | POA: Diagnosis not present

## 2022-01-19 DIAGNOSIS — F331 Major depressive disorder, recurrent, moderate: Secondary | ICD-10-CM

## 2022-01-19 DIAGNOSIS — F419 Anxiety disorder, unspecified: Secondary | ICD-10-CM | POA: Diagnosis not present

## 2022-01-19 MED ORDER — NALTREXONE HCL 50 MG PO TABS: 50.0000 mg | ORAL_TABLET | Freq: Every day | ORAL | 0 refills | Status: DC

## 2022-01-19 MED ORDER — BUPROPION HCL ER (XL) 150 MG PO TB24: 150.0000 mg | ORAL_TABLET | Freq: Every day | ORAL | 0 refills | Status: DC

## 2022-01-19 MED ORDER — BUSPIRONE HCL 5 MG PO TABS: 5.0000 mg | ORAL_TABLET | Freq: Two times a day (BID) | ORAL | 0 refills | Status: DC

## 2022-01-19 NOTE — Progress Notes (Signed)
Virtual Visit via Telephone Note  I connected with Kara Hall on 01/19/22 at 10:00 AM EST by telephone and verified that I am speaking with the correct person using two identifiers.  Location: Patient: Home Provider: Home Office   I discussed the limitations, risks, security and privacy concerns of performing an evaluation and management service by telephone and the availability of in person appointments. I also discussed with the patient that there may be a patient responsible charge related to this service. The patient expressed understanding and agreed to proceed.   History of Present Illness: Patient requested an earlier appointment.  She called earlier and reported a lot of anxiety and nervousness.  She mentioned that she is drinking alcohol for a while.  She reported while she was working at [employer] before her current job she had started drinking and when she quit that chart she was sober for a year until last year she picked up drinking.  She admitted that she enjoy drinking and does not want to give up.  Patient also feels that her husband wants some other medication that she can take and still drink alcohol.  She admitted noncompliant with Risperdal but last night she took the Risperdal and slept well.  She reported at least 3 glass of wine before she go to sleep.  She is not sure if she wants to give up the drinking but realizes that some of the psychotropic medications are not safe while she drinks.  She was very sick last month and seen in the emergency room because of cough.  She is not sure if the higher dose of Wellbutrin which was given on the last visit may have contributed to sickness.  We also provided gabapentin but sometimes she tends to forget to take and did not feel when she take it helped.  Patient reported job is very stressful.  She works for a [employer], [employer].  She admitted occasionally feeling of hopelessness and passive suicidal thoughts but no plan  or any intent.  She wants to enjoy life.  She is in a planning to move to a bigger place and also looking forward to become the grandmother.  Her daughter is pregnant and due on Valentine's Day.  She admitted marriage sometimes stressful because husband does not understand her emotions.  During the conversation she tends to get distracted and like [place] to help her but also do not understand that combination of alcohol and psychotropic medication may have a detrimental side effects.  She also prescribed Xanax, hydroxyzine which she not taking recently.  She like something to help her anxiety during the day because again reported job is very stressful.  We have offered therapy but patient not interested because she does not have a time for therapy.     Past Psychiatric History: Tried lexapro from psychiatrist made her hyper.  Allergic to Trazodone and nefazodone.  Headache from Abilify. H/O impulsive buying and excessive spending. No history of suicidal attempt, psychosis and inpatient treatment.   Psychiatric Specialty Exam: Physical Exam  Review of Systems  Weight 203 lb (92.1 kg), last menstrual period 01/08/2019.There is no height or weight on file to calculate BMI.  General Appearance: Fairly Groomed  Eye Contact:  Fair  Speech:  Normal Rate  Volume:  Decreased  Mood:  Anxious and emotional  Affect:  Constricted and Depressed  Thought Process:  Descriptions of Associations: Intact  Orientation:  Full (Time, Place, and Person)  Thought Content:  Rumination  Suicidal Thoughts:  No  Homicidal Thoughts:  No  Memory:  Immediate;   Good Recent;   Good Remote;   Good  Judgement:  Fair  Insight:  Shallow  Psychomotor Activity:  Increased  Concentration:  Concentration: Fair and Attention Span: Fair  Recall:  [place] of Knowledge:  Good  Language:  Good  Akathisia:  No  Handed:  Right  AIMS (if indicated):     Assets:  Communication Skills Desire for  Improvement Housing Talents/Skills Transportation  ADL's:  Intact  Cognition:  WNL  Sleep:   fair      Assessment and Plan: Major depressive disorder, recurrent.  Anxiety.  Alcohol abuse.  Today patient mentioned about her drinking issue but also does not want to quit and like something that she can take besides drinking at night.  She admitted to enjoy drinking with her husband at night.  We did talk about her medication, recent emergency room visit because of sickness.  She is not sure if the higher dose of Wellbutrin may have caused sickness which she described excessive cough and breathing.  Patient shows limited insight into her breathing issues and I explained none of the psychotropic medication is safe if she continues to drink.  She is not interested to get any therapy because job is stressful and busy.  After a long discussion patient agree to consider cutting down the drinking and agreed to consider a trial of naltrexone.  She liked the Risperdal that helps her sleep but afraid that it makes her groggy and may not able to go to work next day.  I recommend she can try cutting down to 0.5 mg.  She does not feel gabapentin helping and I will discontinue that.  We will start the BuSpar 5 mg 2 times a day to help with anxiety.  I will also reduce Wellbutrin 150 since she feels higher dose may have contributed to more anxiety.  We discussed long-term use of alcohol can cause worsening of depression, anxiety and sudden medication mix and that alcohol can contribute to seizures.  Discontinue Xanax, hydroxyzine, and gabapentin.  Discussed safety concern that anytime having active suicidal thoughts or homicidal thought then she need to call 911 or go to local emergency room.  Follow-up in 4 weeks.  Follow Up Instructions:    I discussed the assessment and treatment plan with the patient. The patient was provided an opportunity to ask questions and all were answered. The patient agreed with the  plan and demonstrated an understanding of the instructions.   The patient was advised to call back or seek an in-person evaluation if the symptoms worsen or if the condition fails to improve as anticipated.  Collaboration of Care: Other provider involved in patient's care AEB notes are available in epic to review.  Patient/Guardian was advised Release of Information must be obtained prior to any record release in order to collaborate their care with an outside provider. Patient/Guardian was advised if they have not already done so to contact the registration department to sign all necessary forms in order for [place] to release information regarding their care.   Consent: Patient/Guardian gives verbal consent for treatment and assignment of benefits for services provided during this visit. Patient/Guardian expressed understanding and agreed to proceed.    I provided 40 minutes of non-face-to-face time during this encounter.   Kathlee Nations, MD

## 2022-02-16 ENCOUNTER — Telehealth (HOSPITAL_BASED_OUTPATIENT_CLINIC_OR_DEPARTMENT_OTHER): Payer: Commercial Managed Care - HMO | Admitting: Psychiatry

## 2022-02-16 ENCOUNTER — Ambulatory Visit: Payer: Commercial Managed Care - HMO | Attending: Internal Medicine | Admitting: Internal Medicine

## 2022-02-16 ENCOUNTER — Encounter (HOSPITAL_COMMUNITY): Payer: Self-pay | Admitting: Psychiatry

## 2022-02-16 ENCOUNTER — Encounter: Payer: Self-pay | Admitting: Internal Medicine

## 2022-02-16 VITALS — BP 107/74 | HR 63 | Temp 97.6°F | Ht 66.0 in | Wt 195.0 lb

## 2022-02-16 VITALS — Wt 190.0 lb

## 2022-02-16 DIAGNOSIS — R0981 Nasal congestion: Secondary | ICD-10-CM | POA: Diagnosis not present

## 2022-02-16 DIAGNOSIS — F1010 Alcohol abuse, uncomplicated: Secondary | ICD-10-CM | POA: Diagnosis not present

## 2022-02-16 DIAGNOSIS — I10 Essential (primary) hypertension: Secondary | ICD-10-CM | POA: Diagnosis not present

## 2022-02-16 DIAGNOSIS — F331 Major depressive disorder, recurrent, moderate: Secondary | ICD-10-CM

## 2022-02-16 DIAGNOSIS — G43009 Migraine without aura, not intractable, without status migrainosus: Secondary | ICD-10-CM

## 2022-02-16 DIAGNOSIS — E782 Mixed hyperlipidemia: Secondary | ICD-10-CM | POA: Diagnosis not present

## 2022-02-16 DIAGNOSIS — F419 Anxiety disorder, unspecified: Secondary | ICD-10-CM | POA: Diagnosis not present

## 2022-02-16 DIAGNOSIS — F1011 Alcohol abuse, in remission: Secondary | ICD-10-CM

## 2022-02-16 DIAGNOSIS — Z1231 Encounter for screening mammogram for malignant neoplasm of breast: Secondary | ICD-10-CM

## 2022-02-16 DIAGNOSIS — Z23 Encounter for immunization: Secondary | ICD-10-CM

## 2022-02-16 MED ORDER — BUPROPION HCL ER (XL) 150 MG PO TB24: 150.0000 mg | ORAL_TABLET | Freq: Every day | ORAL | 1 refills | Status: DC

## 2022-02-16 MED ORDER — ZOSTER VAC RECOMB ADJUVANTED 50 MCG/0.5ML IM SUSR: 0.5000 mL | Freq: Once | INTRAMUSCULAR | 0 refills | Status: AC

## 2022-02-16 MED ORDER — BUTALBITAL-APAP-CAFFEINE 50-325-40 MG PO TABS: ORAL_TABLET | ORAL | 1 refills | Status: DC

## 2022-02-16 MED ORDER — FLUTICASONE PROPIONATE 50 MCG/ACT NA SUSP: 2.0000 | Freq: Every day | NASAL | 1 refills | Status: AC

## 2022-02-16 MED ORDER — RISPERIDONE 0.5 MG PO TABS: 0.5000 mg | ORAL_TABLET | Freq: Every day | ORAL | 0 refills | Status: DC

## 2022-02-16 MED ORDER — BUSPIRONE HCL 5 MG PO TABS: 5.0000 mg | ORAL_TABLET | Freq: Two times a day (BID) | ORAL | 1 refills | Status: DC

## 2022-02-16 NOTE — Progress Notes (Signed)
Patient ID: Kara Hall, female    DOB: 1970/11/06  MRN: [id number]  CC: Hypertension (Htn f/u. Med refill /Burning in nose, unable to sneeze, cough, headache pt feels that it's a sinus infection/Already received flu vax)   Subjective: Kara Hall is a 52 y.o. female who presents for chronic ds management Her concerns today include:  Patient with history of HTN, thyroid cancer (dx age 56, s/p thyroidectomy), anxiety/dep, GERD, lumbar radiculopathy, HL.    HTN:   taking Diovan 40 mg and Norvasc 5 mg daily.   No checking BP but does have a device.  She limits salt in foods as much as she can  Dep/Anx: She is followed by Dr. Adele Hall.  Saw him today and had medications adjusted.  She tells me she has had a hard winter.  Coworker fell in the shower at home and died several weeks ago when she was depressed about that.  She was drinking 3 glasses of wine at night.  Dr. Adele Hall counseled her about this and encouraged her to cut back or quit.  He had prescribed naltrexone for her on visit 01/19/2022.  Since then patient states she decided to quit completely and never filled the prescription for the naltrexone  Weight is down 8 pounds since last visit with me in October.  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  She gets sinus congestion and sinus headaches at times that are frontal and between the eyes.  She is needing refill on Flonase.  She continues to take Claritin.  Request refill on Fioricet to take as needed when she has flare of migraines.  They do not occur frequent but she finds the Fioricet helpful when she does have a migraine.  She has been taking propranolol 10 mg once a day instead of twice a day.  She did not realize that the instructions were for twice a day.  Her pulse rate today is 63.  Patient Active Problem List   Diagnosis Date Noted    Mixed hyperlipidemia 08/19/2020   Obesity (BMI 30.0-34.9) 08/19/2020   Stressful life event affecting family 08/19/2020   Plantar fasciitis, bilateral 08/19/2020   Status post hysterectomy 12/18/2018   Prolonged Q-T interval on ECG 11/24/2018   Essential hypertension 11/24/2018   Dyspareunia in female 03/24/2018   Increased body mass index 03/24/2018   Carpal tunnel syndrome on both sides 12/10/2017   Achilles tendon contracture, left 10/04/2016   Displaced fracture of fifth metatarsal bone of left foot with delayed healing 05/07/2016   Bilateral wrist pain 11/28/2015   History of thyroid cancer 08/09/2013   Anxiety 01/12/2013     Current Outpatient Medications on File Prior to Visit  Medication Sig Dispense Refill   ALPRAZolam (XANAX) 0.5 MG tablet Take 0.5-1 tablets (0.25-0.5 mg total) by mouth daily as needed for anxiety. 10 tablet 0   amLODipine (NORVASC) 5 MG tablet Take 1 tablet (5 mg total) by mouth daily. 90 tablet 1   benzonatate (TESSALON) 100 MG capsule Take 1 capsule (100 mg total) by mouth every 8 (eight) hours. 21 capsule 0   butalbital-acetaminophen-caffeine (FIORICET) 50-325-40 MG tablet TAKE 1 TO 2 TABLETS BY MOUTH DAILY AS NEEDED 15 tablet 1   fluticasone (FLONASE) 50 MCG/ACT nasal spray Place 2 sprays into both nostrils daily. 16 g 1   hydrOXYzine (ATARAX) 10 MG tablet Take 1-2  tablets (10-20 mg total) by mouth at bedtime as needed and may repeat dose one time if needed for anxiety. 30 tablet 0   levothyroxine (SYNTHROID, LEVOTHROID) 125 MCG tablet Take 125 mcg by mouth daily before breakfast.      loratadine (CLARITIN) 10 MG tablet Take 1 tablet (10 mg total) by mouth at bedtime. 90 tablet 1   naltrexone (DEPADE) 50 MG tablet Take 1 tablet (50 mg total) by mouth daily. 30 tablet 0   omeprazole (PRILOSEC) 20 MG capsule TAKE 1 CAPSULE(20 MG) BY MOUTH DAILY 90 capsule 0   pravastatin (PRAVACHOL) 10 MG tablet TAKE 1 TABLET BY MOUTH EVERY DAY EVERY MONDAY, WEDNESDAY,  FRIDAY 38 tablet 1   propranolol (INDERAL) 10 MG tablet Take 1 tablet (10 mg total) by mouth 2 (two) times daily. 180 tablet 1   valsartan (DIOVAN) 40 MG tablet TAKE 1 TABLET(40 MG) BY MOUTH DAILY 90 tablet 1   gabapentin (NEURONTIN) 100 MG capsule Take 1 capsule (100 mg total) by mouth 2 (two) times daily. (Patient not taking: Reported on 01/19/2022) 60 capsule 2   SUMAtriptan (IMITREX) 50 MG tablet 1 tab at the start of headache.  May repeat in 2 hours if no relief.  Max 2 tabs per 24-hour. (Patient not taking: Reported on 02/16/2022) 10 tablet 1   traMADol (ULTRAM) 50 MG tablet Take 1 tablet (50 mg total) by mouth every 8 (eight) hours as needed for moderate pain or severe pain. (Patient not taking: Reported on 01/19/2022) 25 tablet 0   Current Facility-Administered Medications on File Prior to Visit  Medication Dose Route Frequency Provider Last Rate Last Admin   triamcinolone acetonide (KENALOG) 10 MG/ML injection 10 mg  10 mg Other Once Landis Martins, DPM        Allergies  Allergen Reactions   Other     BLOOD REFUSAL    Sulfa Antibiotics Other (See Comments) and Nausea And Vomiting    Severe bladder, and kidney infection   Trazodone And Nefazodone Other (See Comments)    Headache every am     Social History   Socioeconomic History   Marital status: Married    Spouse name: [name]   Number of children: 3   Years of education: 12+   Highest education level: Not on file  Occupational History   Occupation: [employer]   Occupation: Cosmetologist    Employer: [employer]  Tobacco Use   Smoking status: Never   Smokeless tobacco: Never  Vaping Use   Vaping Use: Never used  Substance and Sexual Activity   Alcohol use: Yes    Comment: occ   Drug use: No   Sexual activity: Yes    Partners: Male    Birth control/protection: Surgical    Comment: Tubal ligation  Other Topics Concern   Not on file  Social History Narrative   Divorce from previous husband in 06/2016.   Now  remarried 07/2016.   Lives at home with her new husband and cat.   She has three grown children who live with her husband. She has a good relationship with her children.   Her father passed away 05/11/2016.   Social Determinants of Health   Financial Resource Strain: Not on file  Food Insecurity: Not on file  Transportation Needs: Not on file  Physical Activity: Not on file  Stress: Not on file  Social Connections: Not on file  Intimate Partner Violence: Not on file    Family History  Problem Relation Age  of Onset   Heart disease Mother    Hyperlipidemia Mother    Heart disease Father    Hyperlipidemia Father    Diabetes Father    Heart disease Brother    Hyperlipidemia Brother    Hypertension Brother    Heart disease Brother    Hyperlipidemia Brother    Colon cancer Maternal Grandfather    Esophageal cancer Neg Hx    Stomach cancer Neg Hx    Rectal cancer Neg Hx     Past Surgical History:  Procedure Laterality Date   CYSTOSCOPY N/A 12/18/2018   Procedure: CYSTOSCOPY;  Surgeon: Jonelle Sidle, MD;  Location: [place];  Service: Gynecology;  Laterality: N/A;   LAPAROSCOPIC LYSIS OF ADHESIONS N/A 12/18/2018   Procedure: LAPAROSCOPIC LYSIS OF ADHESIONS;  Surgeon: Jonelle Sidle, MD;  Location: [place];  Service: Gynecology;  Laterality: N/A;   ROBOTIC ASSISTED LAPAROSCOPIC HYSTERECTOMY AND SALPINGECTOMY Bilateral 12/18/2018   Procedure: XI ROBOTIC ASSISTED LAPAROSCOPIC HYSTERECTOMY AND BILATERAL SALPINGECTOMY;  Surgeon: Jonelle Sidle, MD;  Location: [place];  Service: Gynecology;  Laterality: Bilateral;   THROMBECTOMY Right 2001   leg; after 3rd child was born; for deep vein thrombosis   THYROIDECTOMY Right 08/28/2013   Procedure: RIGHT THYROID LOBECTOMY POSSIBLE TOTAL THYROIDECTOMY;  Surgeon: Jodi Marble, MD;  Location: [place];  Service: ENT;  Laterality: Right;   THYROIDECTOMY Left 09/10/2013    Procedure: COMPLETE LEFT THYROIDECTOMY;  Surgeon: Jodi Marble, MD;  Location: [place];  Service: ENT;  Laterality: Left;   TUBAL LIGATION  2001    ROS: Review of Systems Negative except as stated above  PHYSICAL EXAM: BP 107/74 (BP Location: Left Arm, Patient Position: Sitting, Cuff Size: Normal)   Pulse 63   Temp 97.6 F (36.4 C) (Oral)   Ht '5\' 6"'$  (1.676 m)   Wt 195 lb (88.5 kg)   LMP 01/08/2019   SpO2 97%   BMI 31.47 kg/m   Wt Readings from Last 3 Encounters:  02/16/22 195 lb (88.5 kg)  02/16/22 190 lb (86.2 kg)  01/19/22 203 lb (92.1 kg)    Physical Exam  General appearance - alert, well appearing, and in no distress Mental status - normal mood, behavior, speech, dress, motor activity, and thought processes Nose -mild enlargement of nasal turbinates Mouth - mucous membranes moist, pharynx normal without lesions Chest - clear to auscultation, no wheezes, rales or rhonchi, symmetric air entry Heart - normal rate, regular rhythm, normal S1, S2, no murmurs, rubs, clicks or gallops Extremities - peripheral pulses normal, no pedal edema, no clubbing or cyanosis     02/16/2022   12:04 PM 10/16/2021    1:42 PM 04/05/2021    9:46 AM  Depression screen PHQ 2/9  Decreased Interest 2 0   Down, Depressed, Hopeless 2 0 1  PHQ - 2 Score 4 0 1  Altered sleeping 2    Tired, decreased energy 2    Change in appetite 1    Feeling bad or failure about yourself  0    Trouble concentrating 0    Moving slowly or fidgety/restless 0    Suicidal thoughts 0    PHQ-9 Score 9         Latest Ref Rng & Units 08/31/2020    3:02 PM 05/19/2020    9:26 AM 12/19/2018    5:05 AM  CMP  Glucose 65 - 99 mg/dL 91  102  122   BUN 6 - 24  mg/dL '14  14  16   '$ Creatinine 0.57 - 1.00 mg/dL 0.54  0.66  0.73   Sodium 134 - 144 mmol/L 139  136  134   Potassium 3.5 - 5.2 mmol/L 3.9  4.3  3.9   Chloride 96 - 106 mmol/L 103  100  100   CO2 20 - 29 mmol/L '20  20  22   '$ Calcium 8.7 - 10.2 mg/dL 8.2  8.7  7.8    Total Protein 6.0 - 8.5 g/dL  7.2    Total Bilirubin 0.0 - 1.2 mg/dL  0.4    Alkaline Phos 44 - 121 IU/L  96    AST 0 - 40 IU/L  22    ALT 0 - 32 IU/L  29     Lipid Panel     Component Value Date/Time   CHOL 274 (H) 05/19/2020 0926   TRIG 110 05/19/2020 0926   HDL 60 05/19/2020 0926   CHOLHDL 4.6 (H) 05/19/2020 0926   CHOLHDL 2.7 11/23/2014 1554   VLDL 11 11/23/2014 1554   LDLCALC 195 (H) 05/19/2020 0926    CBC    Component Value Date/Time   WBC 7.3 05/19/2020 0926   WBC 13.9 (H) 12/19/2018 0812   RBC 4.56 05/19/2020 0926   RBC 3.55 (L) 12/19/2018 0812   HGB 14.5 05/19/2020 0926   HCT 42.4 05/19/2020 0926   PLT 288 05/19/2020 0926   MCV 93 05/19/2020 0926   MCH 31.8 05/19/2020 0926   MCH 28.2 12/19/2018 0812   MCHC 34.2 05/19/2020 0926   MCHC 30.6 12/19/2018 0812   RDW 12.2 05/19/2020 0926   LYMPHSABS 1.5 05/19/2020 0926   MONOABS 0.5 11/23/2014 1554   EOSABS 0.2 05/19/2020 0926   BASOSABS 0.1 05/19/2020 0926    ASSESSMENT AND PLAN: 1. Essential hypertension At goal.  Continue Diovan 40 mg daily and Norvasc 5 mg daily.  2. Mixed hyperlipidemia Continue pravastatin 10 mg 3 times a week.  3. Migraine without aura and without status migrainosus, not intractable [place] controlled substance reporting system reviewed.  Refill given on Fioricet. - butalbital-acetaminophen-caffeine (FIORICET) 50-325-40 MG tablet; TAKE 1 TO 2 TABLETS BY MOUTH DAILY AS NEEDED  Dispense: 15 tablet; Refill: 1  4. Sinus congestion - fluticasone (FLONASE) 50 MCG/ACT nasal spray; Place 2 sprays into both nostrils daily.  Dispense: 16 g; Refill: 1  5. Alcohol use disorder, mild, in early remission Commended her on quitting.  Discussed health risks associated with excessive alcohol use.  Advised that if she gets cravings, she can fill the prescription for the naltrexone that was prescribed to her by Dr. Adele Hall  6. Encounter for screening mammogram for malignant neoplasm of breast -  MM Digital Screening; Future  7. Need for shingles vaccine Printed prescription given to her to take to the pharmacy to get first Shingrix vaccine. - Zoster Vaccine Adjuvanted [name]) injection; Inject 0.5 mLs into the muscle once for 1 dose.  Dispense: 0.5 mL; Refill: 0     Patient was given the opportunity to ask questions.  Patient verbalized understanding of the plan and was able to repeat key elements of the plan.   This documentation was completed using Radio producer.  Any transcriptional errors are unintentional.  No orders of the defined types were placed in this encounter.    Requested Prescriptions    No prescriptions requested or ordered in this encounter    No follow-ups on file.  Karle Plumber, MD, FACP

## 2022-02-16 NOTE — Progress Notes (Signed)
.Virtual Visit via Telephone Note  I connected with Kara Hall on 02/16/22 at  8:20 AM EST by telephone and verified that I am speaking with the correct person using two identifiers.  Location: Patient: Home Provider: Home office   I discussed the limitations, risks, security and privacy concerns of performing an evaluation and management service by telephone and the availability of in person appointments. I also discussed with the patient that there may be a patient responsible charge related to this service. The patient expressed understanding and agreed to proceed.   History of Present Illness: Patient is evaluated by phone session.  She stopped drinking since the last visit.  She realized that she needed to focus on her anxiety and general health and does not want to go back to drinking.  She never picked up the naltrexone because it was expensive and also she was to cut down the drinking on her own.  She admitted struggle with sleep and anxiety.  We started her on BuSpar 5 mg 2 times a day.  She reported nausea and stomach upset but overall she feels it is helping the anxiety.  Patient told last week one of employee died after he had a fall and it was very traumatic.  Patient told she was very anxious, nervous but decided not to go to the funeral because she could not handle the funeral services.  Patient endorsed racing thoughts, anxiety at bedtime.  She is asking to go back on Xanax.  She is taking Wellbutrin but reduce amount.  In the past she had tried gabapentin, hydroxyzine, Risperdal but not consistent with the respite all and other medicine she felt it did not help.  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  She reported job is still very stressful and she is seriously looking for another job.  Patient is very happy as last Friday her daughter had a baby.  She reported general anxiety but denies any feeling of hopelessness or worthlessness.  She had lost weight but not sure if due  to the side effects of BuSpar but she also reported trying to lose weight.  She denies any suicidal thoughts.  Past Psychiatric History: Tried lexapro from psychiatrist made her hyper.  Allergic to Trazodone and nefazodone.  Headache from Abilify. H/O impulsive buying and excessive spending. No history of suicidal attempt, psychosis and inpatient treatment.  We tried gabapentin, hydroxyzine and Risperdal but stopped due to not consistent and poor compliance.   Psychiatric Specialty Exam: Physical Exam  Review of Systems  Weight 190 lb (86.2 kg), last menstrual period 01/08/2019.There is no height or weight on file to calculate BMI.  General Appearance: NA  Eye Contact:  NA  Speech:  Slow  Volume:  Decreased  Mood:  Anxious and Dysphoric  Affect:  NA  Thought Process:  Descriptions of Associations: Intact  Orientation:  Full (Time, Place, and Person)  Thought Content:  Rumination  Suicidal Thoughts:  No  Homicidal Thoughts:  No  Memory:  Immediate;   Good Recent;   Good Remote;   Good  Judgement:  Hall  Insight:  Shallow  Psychomotor Activity:  NA  Concentration:  Concentration: Good and Attention Span: Good  Recall:  Good  Fund of Knowledge:  Good  Language:  Good  Akathisia:  No  Handed:  Right  AIMS (if indicated):     Assets:  Communication Skills Desire for Improvement Housing Social Support Transportation  ADL's:  Intact  Cognition:  WNL  Sleep:   poor      Assessment and Plan: Major depressive disorder, recurrent.  Anxiety.  Alcohol abuse.  Patient not taking naltrexone because she did not pick up after found out that it is expensive.  In the past she is not consistent with gabapentin, hydroxyzine and Risperdal.  She is trying to be compliant with the current medicine which is BuSpar and low-dose Wellbutrin.  She became sick and she believed the higher dose of Wellbutrin made her sick and she started to have a cough.  We have reduced her Wellbutrin dose and she  feels it is helping.  She wanted to Xanax because she still have anxiety.  I explained her anxiety is not on occasions she have anxiety all the day and she needs a medicine that she can take every day.  I recommend should give more time to BuSpar to help with anxiety.  We talk about starting a low-dose Risperdal again since it did help but she stopped after not consistent with the medication.  She recall it did help and low-dose.  Patient agreed with the plan.  She will start the Risperdal 0.5 mg at bedtime to help sleep, anxiety.  Patient also promised to consistent with Wellbutrin XL 150 mg in the morning and BuSpar 5 mg 2 times a day.  If patient continues to have GI symptoms then we may have to switch to a different medication.  Recommended to call us back if she has any question or any concern.  Follow-up in 8 weeks.  Follow Up Instructions:    I discussed the assessment and treatment plan with the patient. The patient was provided an opportunity to ask questions and all were answered. The patient agreed with the plan and demonstrated an understanding of the instructions.   The patient was advised to call back or seek an in-person evaluation if the symptoms worsen or if the condition fails to improve as anticipated.  Collaboration of Care: Other provider involved in patient's care AEB notes are available in epic to review.  Patient/Guardian was advised Release of Information must be obtained prior to any record release in order to collaborate their care with an outside provider. Patient/Guardian was advised if they have not already done so to contact the registration department to sign all necessary forms in order for [place] to release information regarding their care.   Consent: Patient/Guardian gives verbal consent for treatment and assignment of benefits for services provided during this visit. Patient/Guardian expressed understanding and agreed to proceed.    I provided 17 minutes of  non-face-to-face time during this encounter.   Kathlee Nations, MD

## 2022-02-17 ENCOUNTER — Other Ambulatory Visit: Payer: Self-pay | Admitting: Internal Medicine

## 2022-02-17 ENCOUNTER — Encounter: Payer: Self-pay | Admitting: Internal Medicine

## 2022-02-17 DIAGNOSIS — E782 Mixed hyperlipidemia: Secondary | ICD-10-CM

## 2022-02-17 LAB — COMPREHENSIVE METABOLIC PANEL
ALT: 14 IU/L (ref 0–32)
AST: 13 IU/L (ref 0–40)
Albumin/Globulin Ratio: 1.8 (ref 1.2–2.2)
Albumin: 4.4 g/dL (ref 3.9–4.9)
Alkaline Phosphatase: 70 IU/L (ref 44–121)
BUN/Creatinine Ratio: 24 — ABNORMAL HIGH (ref 9–23)
BUN: 16 mg/dL (ref 6–24)
Bilirubin Total: 0.4 mg/dL (ref 0.0–1.2)
CO2: 23 mmol/L (ref 20–29)
Calcium: 8.7 mg/dL (ref 8.7–10.2)
Chloride: 102 mmol/L (ref 96–106)
Creatinine, Ser: 0.66 mg/dL (ref 0.57–1.00)
Globulin, Total: 2.5 g/dL (ref 1.5–4.5)
Glucose: 100 mg/dL — ABNORMAL HIGH (ref 70–99)
Potassium: 4.5 mmol/L (ref 3.5–5.2)
Sodium: 140 mmol/L (ref 134–144)
Total Protein: 6.9 g/dL (ref 6.0–8.5)
eGFR: 107 mL/min/{1.73_m2} (ref >59)

## 2022-02-17 LAB — LIPID PANEL
Chol/HDL Ratio: 4.3 ratio (ref 0.0–4.4)
Cholesterol, Total: 253 mg/dL — ABNORMAL HIGH (ref 100–199)
HDL: 59 mg/dL (ref >39)
LDL Chol Calc (NIH): 173 mg/dL — ABNORMAL HIGH (ref 0–99)
Triglycerides: 120 mg/dL (ref 0–149)
VLDL Cholesterol Cal: 21 mg/dL (ref 5–40)

## 2022-02-17 LAB — CBC
Hematocrit: 39.1 % (ref 34.0–46.6)
Hemoglobin: 13.6 g/dL (ref 11.1–15.9)
MCH: 32.5 pg (ref 26.6–33.0)
MCHC: 34.8 g/dL (ref 31.5–35.7)
MCV: 93 fL (ref 79–97)
Platelets: 295 10*3/uL (ref 150–450)
RBC: 4.19 x10E6/uL (ref 3.77–5.28)
RDW: 13.1 % (ref 11.7–15.4)
WBC: 6.9 10*3/uL (ref 3.4–10.8)

## 2022-02-17 MED ORDER — PRAVASTATIN SODIUM 10 MG PO TABS: ORAL_TABLET | ORAL | 1 refills | Status: DC

## 2022-02-26 ENCOUNTER — Ambulatory Visit: Payer: Self-pay | Admitting: *Deleted

## 2022-02-26 NOTE — Telephone Encounter (Signed)
  Summary: cut toe   Pt has a cut one the top of her toe that she got last night with a PC of glass.  She said she has had a tetanus shot but it hurts pretty bad and she does not know if she needs a stitch or not.  CB#  [id number]           Chief Complaint: cut left top great toe Symptoms: cut left great toe on top with glass that was on heel and tried to get glass off per husband. Patient in shower at time of call. Husband reports cut required "take a while to stop bleeding". Reports on blood thinner. None noted on current med list. Unknown tetanus shot last given. Reports small cut quarter on an inch Frequency: today  Pertinent Negatives: Patient denies bleeding no redness no drainage no fever Disposition: '[]'$ ED /'[x]'$ Urgent Care (no appt availability in office) / '[]'$ Appointment(In office/virtual)/ '[]'$  [facility]/ '[]'$ Home Care/ '[]'$ Refused Recommended Disposition /'[]'$ [facility]/ '[]'$  Follow-up with PCP Additional Notes:  Recommended UC. Husband reports patient in shower and will monitor cut and bleeding . If worsens will call back or go to UC. Please advise regarding tetanus shot.      Reason for Disposition  [1] Last tetanus shot > 10 years ago AND [2] CLEAN cut  Answer Assessment - Initial Assessment Questions 1. APPEARANCE of INJURY: "What does the injury look like?"      Patient in shower and husband reports small cut to top of left great toe 2. SIZE: "How large is the cut?"      Quarter of an inch 3. BLEEDING: "Is it bleeding now?" If Yes, ask: "Is it difficult to stop?"      Bleeding now stopped but did "take a while to stop" 4. LOCATION: "Where is the injury located?"      Left great toe top  5. ONSET: "How long ago did the injury occur?"      Today  6. MECHANISM: "Tell me how it happened."      Had piece of glass on bottom of heel and patient tried to wipe it against top of foot to remove it and cut left toe 7. TETANUS: "When was the last tetanus  booster?"     Did not see listed in chart  8. PREGNANCY: "Is there any chance you are pregnant?" "When was your last menstrual period?"     na  Protocols used: Cuts and Lacerations-A-AH

## 2022-02-26 NOTE — Telephone Encounter (Signed)
Call placed to patient unable to reach message left on VM.

## 2022-03-07 ENCOUNTER — Ambulatory Visit: Payer: Self-pay

## 2022-03-08 ENCOUNTER — Telehealth (HOSPITAL_COMMUNITY): Payer: Commercial Managed Care - HMO | Admitting: Psychiatry

## 2022-03-09 ENCOUNTER — Ambulatory Visit: Payer: Commercial Managed Care - HMO

## 2022-03-14 ENCOUNTER — Telehealth (HOSPITAL_COMMUNITY): Payer: Commercial Managed Care - HMO | Admitting: Psychiatry

## 2022-03-20 ENCOUNTER — Other Ambulatory Visit (HOSPITAL_COMMUNITY): Payer: Self-pay | Admitting: Psychiatry

## 2022-03-20 DIAGNOSIS — F331 Major depressive disorder, recurrent, moderate: Secondary | ICD-10-CM

## 2022-03-20 DIAGNOSIS — F419 Anxiety disorder, unspecified: Secondary | ICD-10-CM

## 2022-03-27 ENCOUNTER — Telehealth (HOSPITAL_COMMUNITY): Payer: Self-pay | Admitting: *Deleted

## 2022-03-27 DIAGNOSIS — F331 Major depressive disorder, recurrent, moderate: Secondary | ICD-10-CM

## 2022-03-27 DIAGNOSIS — F419 Anxiety disorder, unspecified: Secondary | ICD-10-CM

## 2022-03-27 MED ORDER — RISPERIDONE 0.5 MG PO TABS: 0.5000 mg | ORAL_TABLET | Freq: Every day | ORAL | 0 refills | Status: DC

## 2022-03-27 NOTE — Addendum Note (Signed)
Addended by: Berniece Andreas T on: 03/27/2022 12:34 PM   Modules accepted: Orders

## 2022-03-27 NOTE — Telephone Encounter (Signed)
Rx REFILL REQUEST--risperiDONE (RISPERDAL) 0.5 MG tablet   [place], [place].  LAST FILL DATE:: 02/19/22   LAST APPT::  02/16/22 NEXT APT::    04/13/22

## 2022-04-09 ENCOUNTER — Other Ambulatory Visit: Payer: Self-pay | Admitting: Internal Medicine

## 2022-04-09 DIAGNOSIS — I10 Essential (primary) hypertension: Secondary | ICD-10-CM

## 2022-04-12 ENCOUNTER — Telehealth (HOSPITAL_COMMUNITY): Payer: Commercial Managed Care - HMO | Admitting: Psychiatry

## 2022-04-13 ENCOUNTER — Telehealth (HOSPITAL_COMMUNITY): Payer: Commercial Managed Care - HMO | Admitting: Psychiatry

## 2022-04-16 ENCOUNTER — Encounter (HOSPITAL_COMMUNITY): Payer: Self-pay | Admitting: Psychiatry

## 2022-04-16 ENCOUNTER — Telehealth (HOSPITAL_BASED_OUTPATIENT_CLINIC_OR_DEPARTMENT_OTHER): Payer: Commercial Managed Care - HMO | Admitting: Psychiatry

## 2022-04-16 VITALS — Wt 190.0 lb

## 2022-04-16 DIAGNOSIS — F410 Panic disorder [episodic paroxysmal anxiety] without agoraphobia: Secondary | ICD-10-CM | POA: Diagnosis not present

## 2022-04-16 DIAGNOSIS — F411 Generalized anxiety disorder: Secondary | ICD-10-CM | POA: Diagnosis not present

## 2022-04-16 DIAGNOSIS — F331 Major depressive disorder, recurrent, moderate: Secondary | ICD-10-CM

## 2022-04-16 MED ORDER — ALPRAZOLAM 0.5 MG PO TABS: 0.2500 mg | ORAL_TABLET | Freq: Every day | ORAL | 0 refills | Status: DC | PRN

## 2022-04-16 MED ORDER — RISPERIDONE 0.5 MG PO TABS: 0.5000 mg | ORAL_TABLET | Freq: Two times a day (BID) | ORAL | 1 refills | Status: DC

## 2022-04-16 MED ORDER — HYDROXYZINE HCL 10 MG PO TABS: 10.0000 mg | ORAL_TABLET | Freq: Every evening | ORAL | 0 refills | Status: DC | PRN

## 2022-04-16 MED ORDER — BUPROPION HCL ER (XL) 150 MG PO TB24: 150.0000 mg | ORAL_TABLET | Freq: Every day | ORAL | 1 refills | Status: DC

## 2022-04-16 NOTE — Progress Notes (Signed)
[facility] MD Virtual Progress Note   Patient Location: Home Provider Location: Home Office  I connect with patient by video and verified that I am speaking with correct person by using two identifiers. I discussed the limitations of evaluation and management by telemedicine and the availability of in person appointments. I also discussed with the patient that there may be a patient responsible charge related to this service. The patient expressed understanding and agreed to proceed.  Kara Hall [id number] 52 y.o.  04/16/2022 8:25 AM  History of Present Illness:  Patient is evaluated by video session.  She admitted some confusion about taking the medication.  She is asking about gabapentin if she is supposed to take and also she ran out of BuSpar and not sure if she need to go back on it.  However she started taking hydroxyzine which was given in October and also she is taking risperidone 0.5 mg in the morning sometimes she takes risperidone 1 mg her old prescription.  She reported symptoms are somewhat better and manageable.  She is happy as she got promoted at work.  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  Her son recently had shoulder surgery and she was involved taking care of him who stayed at patient's daughter's house in [place] but she dropped him back to [place] after the surgery.  She is sleeping better.  She denies any recent alcohol use and admitted that she was embarrassed when her primary care asked about alcohol.  Her appetite is okay and she is maintaining her weight.  Her labs are okay, cholesterol is still high but better than before.  She has no tremors, shakes or any EPS.  She has panic attacks sometimes at work when she gets overwhelmed with the customer.  She denies any crying spells or any feeling of hopelessness or worthlessness.  She denies any suicidal thoughts.  She denies any excessive  spending, mania recently.  She again asking to go back to Xanax to help her panic attacks.  Past Psychiatric History: Tried lexapro from psychiatrist made hyper.  Allergic to Trazodone and nefazodone.  Headache from Abilify. H/O impulsive buying and excessive spending. No history of suicidal attempt, psychosis and inpatient treatment.  We tried gabapentin, hydroxyzine, Buspar but discontinued due to not consistent and poor compliance. Prescribed Naltrexone to help ETOH craving but could not afford.    Outpatient Encounter Medications as of 04/16/2022  Medication Sig   ALPRAZolam (XANAX) 0.5 MG tablet Take 0.5-1 tablets (0.25-0.5 mg total) by mouth daily as needed for anxiety.   amLODipine (NORVASC) 5 MG tablet Take 1 tablet by mouth once daily   benzonatate (TESSALON) 100 MG capsule Take 1 capsule (100 mg total) by mouth every 8 (eight) hours. (Patient not taking: Reported on 04/16/2022)   buPROPion (WELLBUTRIN XL) 150 MG 24 hr tablet Take 1 tablet (150 mg total) by mouth daily.   butalbital-acetaminophen-caffeine (FIORICET) 50-325-40 MG tablet TAKE 1 TO 2 TABLETS BY MOUTH DAILY AS NEEDED   fluticasone (FLONASE) 50 MCG/ACT nasal spray Place 2 sprays into both nostrils daily.   hydrOXYzine (ATARAX) 10 MG tablet Take 1-2 tablets (10-20 mg total) by mouth at bedtime as needed and may repeat dose one time if needed for anxiety.   levothyroxine (SYNTHROID, LEVOTHROID) 125 MCG tablet Take 125 mcg by mouth daily before breakfast.    loratadine (CLARITIN) 10 MG tablet Take 1 tablet (10 mg total) by mouth at bedtime.  omeprazole (PRILOSEC) 20 MG capsule TAKE 1 CAPSULE(20 MG) BY MOUTH DAILY   pravastatin (PRAVACHOL) 10 MG tablet TAKE 1 TABLET BY MOUTH five days a week.   propranolol (INDERAL) 10 MG tablet Take 1 tablet (10 mg total) by mouth 2 (two) times daily.   risperiDONE (RISPERDAL) 0.5 MG tablet Take 1 tablet (0.5 mg total) by mouth 2 (two) times daily.   valsartan (DIOVAN) 40 MG tablet Take 1 tablet  by mouth once daily   [DISCONTINUED] ALPRAZolam (XANAX) 0.5 MG tablet Take 0.5-1 tablets (0.25-0.5 mg total) by mouth daily as needed for anxiety.   [DISCONTINUED] buPROPion (WELLBUTRIN XL) 150 MG 24 hr tablet Take 1 tablet (150 mg total) by mouth daily.   [DISCONTINUED] busPIRone (BUSPAR) 5 MG tablet Take 1 tablet (5 mg total) by mouth in the morning and at bedtime. (Patient not taking: Reported on 04/16/2022)   [DISCONTINUED] gabapentin (NEURONTIN) 100 MG capsule Take 1 capsule (100 mg total) by mouth 2 (two) times daily. (Patient not taking: Reported on 01/19/2022)   [DISCONTINUED] hydrOXYzine (ATARAX) 10 MG tablet Take 1-2 tablets (10-20 mg total) by mouth at bedtime as needed and may repeat dose one time if needed for anxiety.   [DISCONTINUED] naltrexone (DEPADE) 50 MG tablet Take 1 tablet (50 mg total) by mouth daily. (Patient not taking: Reported on 04/16/2022)   [DISCONTINUED] risperiDONE (RISPERDAL) 0.5 MG tablet Take 1 tablet (0.5 mg total) by mouth at bedtime.   Facility-Administered Encounter Medications as of 04/16/2022  Medication   triamcinolone acetonide (KENALOG) 10 MG/ML injection 10 mg    Recent Results (from the past 2160 hour(s))  CBC     Status: None   Collection Time: 02/16/22 12:15 PM  Result Value Ref Range   WBC 6.9 3.4 - 10.8 x10E3/uL   RBC 4.19 3.77 - 5.28 x10E6/uL   Hemoglobin 13.6 11.1 - 15.9 g/dL   Hematocrit 39.1 34.0 - 46.6 %   MCV 93 79 - 97 fL   MCH 32.5 26.6 - 33.0 pg   MCHC 34.8 31.5 - 35.7 g/dL   RDW 13.1 11.7 - 15.4 %   Platelets 295 150 - 450 x10E3/uL  Comprehensive metabolic panel     Status: Abnormal   Collection Time: 02/16/22 12:15 PM  Result Value Ref Range   Glucose 100 (H) 70 - 99 mg/dL   BUN 16 6 - 24 mg/dL   Creatinine, Ser 0.66 0.57 - 1.00 mg/dL   eGFR 107 >59 mL/min/1.73   BUN/Creatinine Ratio 24 (H) 9 - 23   Sodium 140 134 - 144 mmol/L   Potassium 4.5 3.5 - 5.2 mmol/L   Chloride 102 96 - 106 mmol/L   CO2 23 20 - 29 mmol/L   Calcium  8.7 8.7 - 10.2 mg/dL   Total Protein 6.9 6.0 - 8.5 g/dL   Albumin 4.4 3.9 - 4.9 g/dL   Globulin, Total 2.5 1.5 - 4.5 g/dL   Albumin/Globulin Ratio 1.8 1.2 - 2.2   Bilirubin Total 0.4 0.0 - 1.2 mg/dL   Alkaline Phosphatase 70 44 - 121 IU/L   AST 13 0 - 40 IU/L   ALT 14 0 - 32 IU/L  Lipid panel     Status: Abnormal   Collection Time: 02/16/22 12:15 PM  Result Value Ref Range   Cholesterol, Total 253 (H) 100 - 199 mg/dL   Triglycerides 120 0 - 149 mg/dL   HDL 59 >39 mg/dL   VLDL Cholesterol Cal 21 5 - 40 mg/dL   LDL Chol  Calc (NIH) 173 (H) 0 - 99 mg/dL   Chol/HDL Ratio 4.3 0.0 - 4.4 ratio    Comment:                                   T. Chol/HDL Ratio                                             Men  Women                               1/2 Avg.Risk  3.4    3.3                                   Avg.Risk  5.0    4.4                                2X Avg.Risk  9.6    7.1                                3X Avg.Risk 23.4   11.0      Psychiatric Specialty Exam: Physical Exam  Review of Systems  Weight 190 lb (86.2 kg), last menstrual period 01/08/2019.There is no height or weight on file to calculate BMI.  General Appearance: Casual  Eye Contact:  Good  Speech:  Clear and Coherent  Volume:  Normal  Mood:  Anxious  Affect:  Congruent  Thought Process:  Goal Directed  Orientation:  Full (Time, Place, and Person)  Thought Content:  Rumination  Suicidal Thoughts:  No  Homicidal Thoughts:  No  Memory:  Immediate;   Fair Recent;   Good Remote;   Fair  Judgement:  Fair  Insight:  Present  Psychomotor Activity:  Decreased  Concentration:  Concentration: Fair and Attention Span: Fair  Recall:  [place] of Knowledge:  Good  Language:  Good  Akathisia:  No  Handed:  Right  AIMS (if indicated):     Assets:  Communication Skills Desire for Improvement Housing Social Support Transportation  ADL's:  Intact  Cognition:  WNL  Sleep:  better     Assessment/Plan: MDD (major  depressive disorder), recurrent episode, moderate - Plan: buPROPion (WELLBUTRIN XL) 150 MG 24 hr tablet, risperiDONE (RISPERDAL) 0.5 MG tablet, hydrOXYzine (ATARAX) 10 MG tablet  Panic attacks - Plan: ALPRAZolam (XANAX) 0.5 MG tablet  GAD (generalized anxiety disorder) - Plan: hydrOXYzine (ATARAX) 10 MG tablet  Review notes, blood work results and go over again with her medication.  Her cholesterol is still high but better than before.  She is not taking cholesterol medicine regularly.  Patient has history of not consistent with the medication but now like to take it on a regular basis because she feels it helps.  She admitted taking the risperidone 0.5 mg every morning but sometimes she also takes 1 mg but it make her sleepy.  I encourage to take the risperidone 0.5 mg in the morning as she has been taking the second dose of 0.5 mg in the evening to  help her anxiety and depression.  Discontinue gabapentin and BuSpar because she is not taking consistently and had tried in the past but stopped after poor compliance.  She is taking hydroxyzine which was given in October which helps anxiety but also make her sleepy.  I encouraged should continue on a regular basis she was seeking had a better tolerance with the side effects and she admitted to help with anxiety.  She still like to have some Xanax for severe panic attack.  The last prescription was given in October for 10 tablets.  We will provide one more prescription but reminded that she need to take it for severe panic attack that did not help with hydroxyzine.  Continue Wellbutrin XL 150 mg daily.  Patient not interested in therapy due to her busy schedule at work.  Encourage walking, watching her calorie intake and exercise.  Her plan is to lose weight and she is working on it.  Recommended to call us back if she has any question, concern or any issue with the medication dosage or directions how to take it.  Follow-up in 2 months.  Follow Up  Instructions:     I discussed the assessment and treatment plan with the patient. The patient was provided an opportunity to ask questions and all were answered. The patient agreed with the plan and demonstrated an understanding of the instructions.   The patient was advised to call back or seek an in-person evaluation if the symptoms worsen or if the condition fails to improve as anticipated.    Collaboration of Care: Other provider involved in patient's care AEB notes are available in epic to review.  Patient/Guardian was advised Release of Information must be obtained prior to any record release in order to collaborate their care with an outside provider. Patient/Guardian was advised if they have not already done so to contact the registration department to sign all necessary forms in order for [place] to release information regarding their care.   Consent: Patient/Guardian gives verbal consent for treatment and assignment of benefits for services provided during this visit. Patient/Guardian expressed understanding and agreed to proceed.     I provided 30 minutes of non face to face time during this encounter.  Kathlee Nations, MD 04/16/2022

## 2022-04-19 IMAGING — CT CT RENAL STONE PROTOCOL
2 of 4 series · 16 of 46 positions shown, 18 images · non-contrast
Comparison: No priors.

CLINICAL DATA: 48-year-old female with history of flank pain.
Suspected kidney stone.

EXAM:
CT ABDOMEN AND PELVIS WITHOUT CONTRAST
TECHNIQUE: Multidetector CT imaging of the abdomen and pelvis was performed
following the standard protocol without IV contrast.

[Series 2: axial st · axial · 0.77mm/px · z∈[+632,+1058]mm · 13 of 93 slices shown, 15 images]
[im 4/93  soft-tissue]
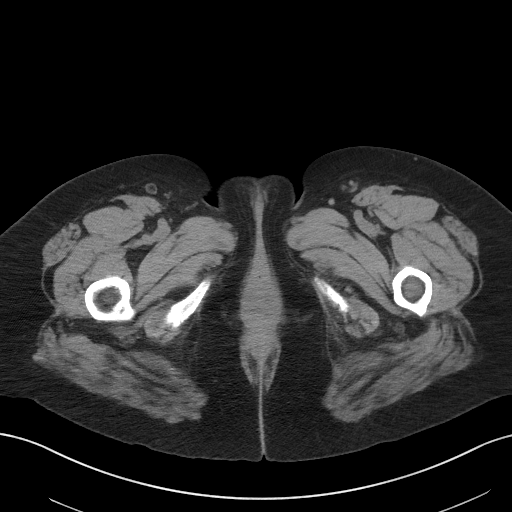
[im 4/93  bone]
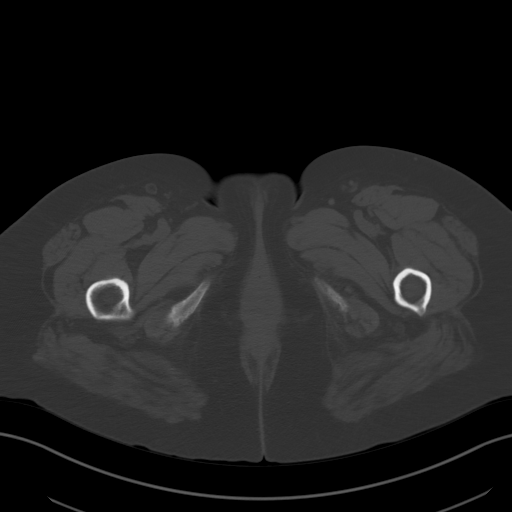
[im 12/93  soft-tissue]
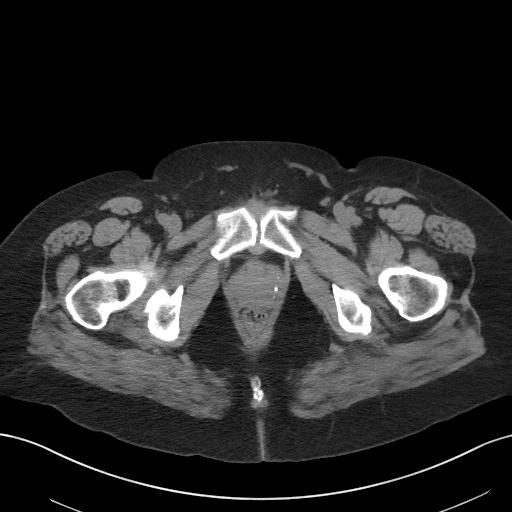
[im 19/93  soft-tissue]
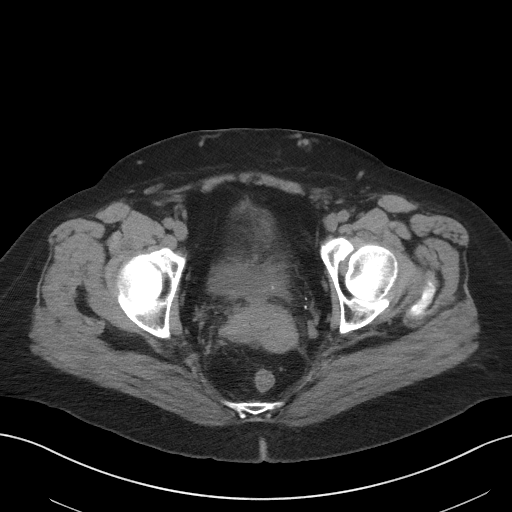
[im 26/93  soft-tissue]
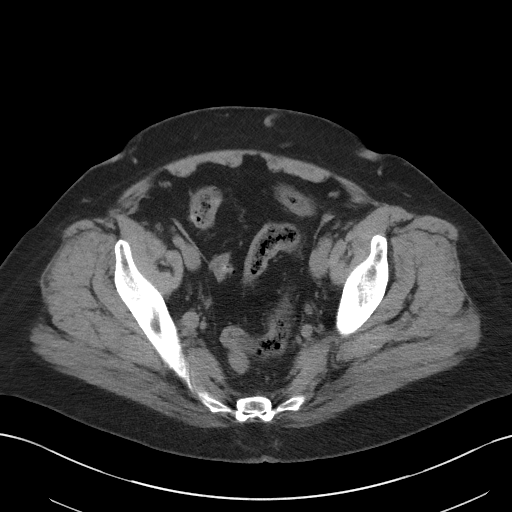
[im 34/93  soft-tissue]
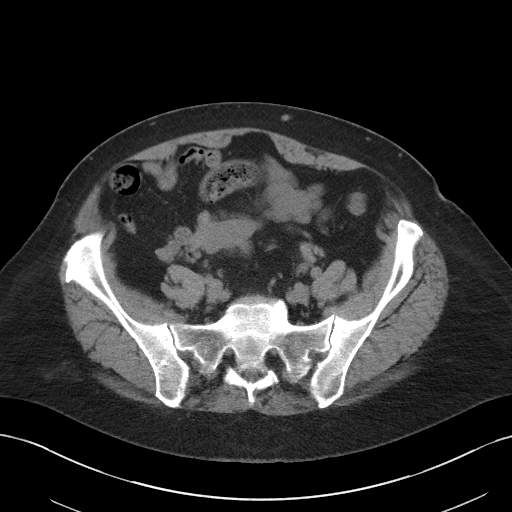
[im 41/93  soft-tissue]
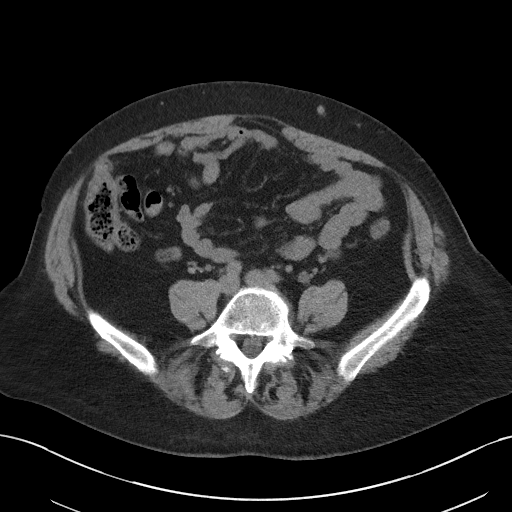
[im 48/93  soft-tissue]
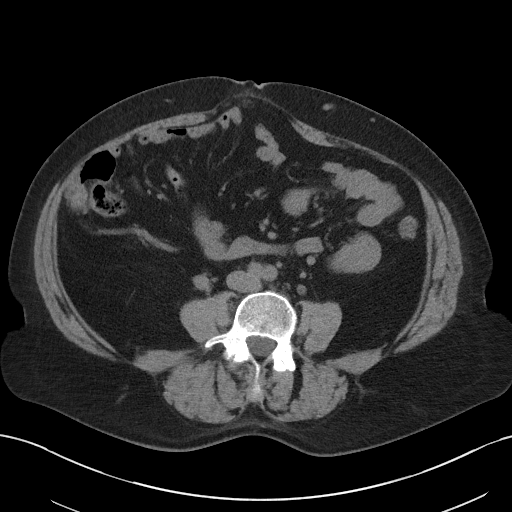
[im 52/93  soft-tissue]
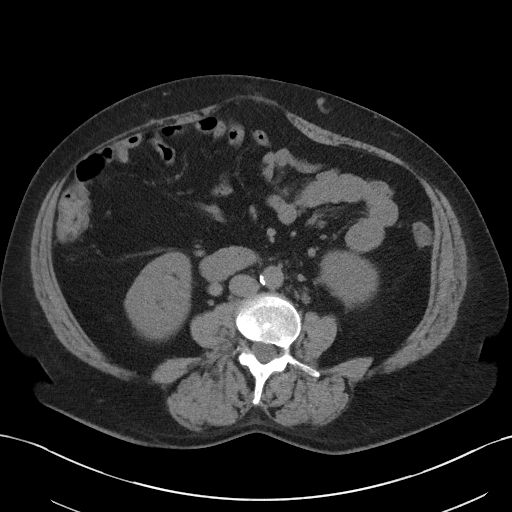
[im 59/93  soft-tissue]
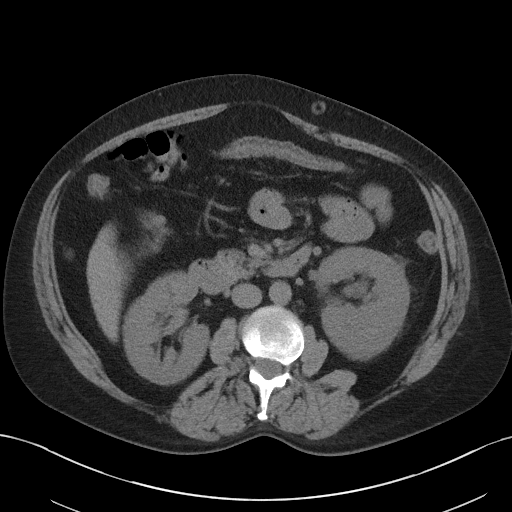
[im 59/93  bone]
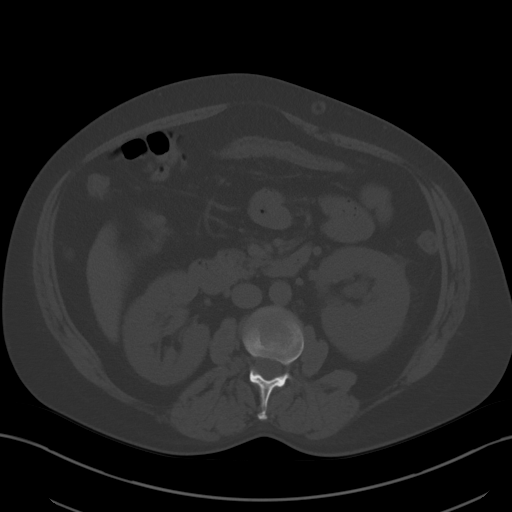
[im 67/93  soft-tissue]
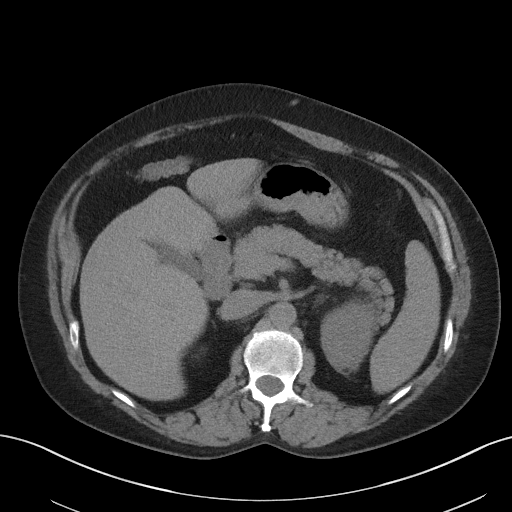
[im 74/93  soft-tissue]
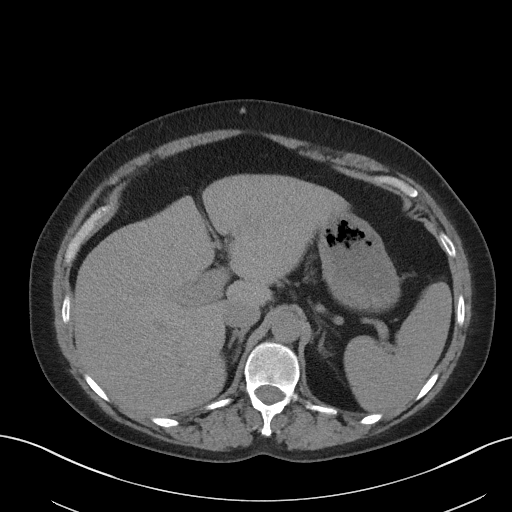
[im 81/93  soft-tissue]
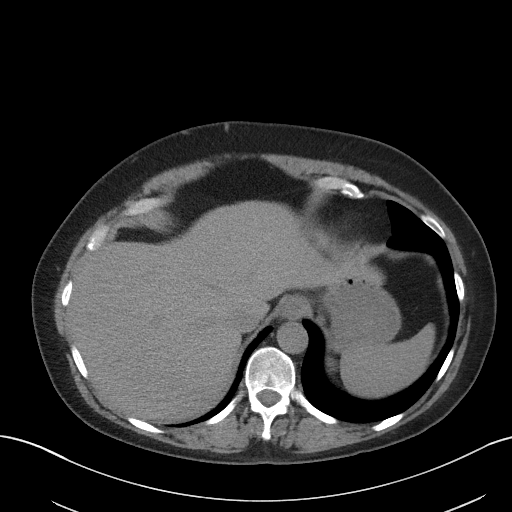
[im 89/93  soft-tissue]
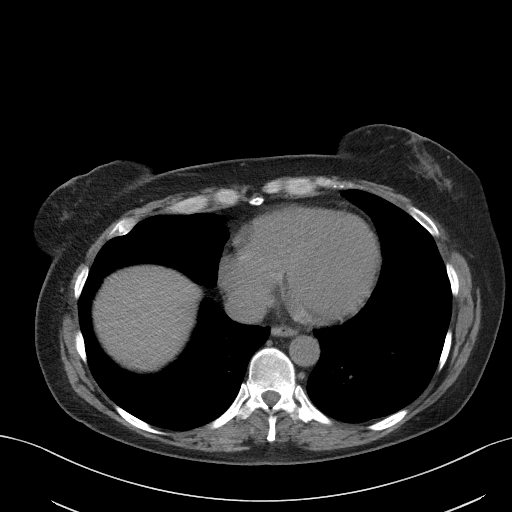

[Series 5: coronal st · coronal · 0.83mm/px · 3 of 96 slices shown]
[im 32/96  soft-tissue]
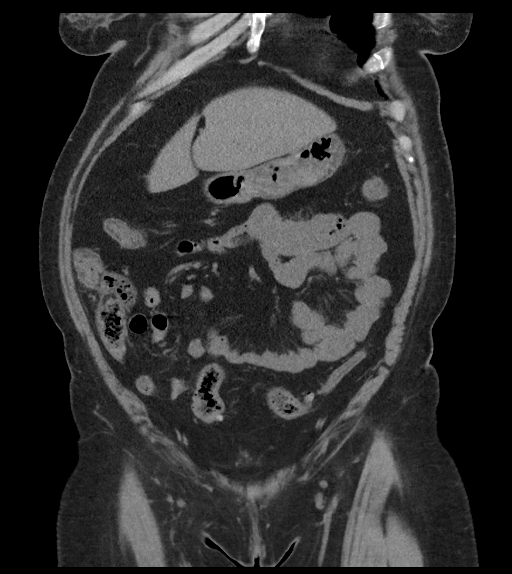
[im 43/96  soft-tissue]
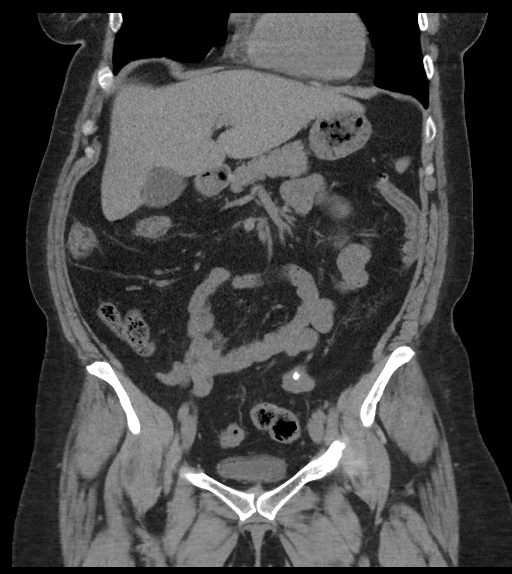
[im 53/96  soft-tissue]
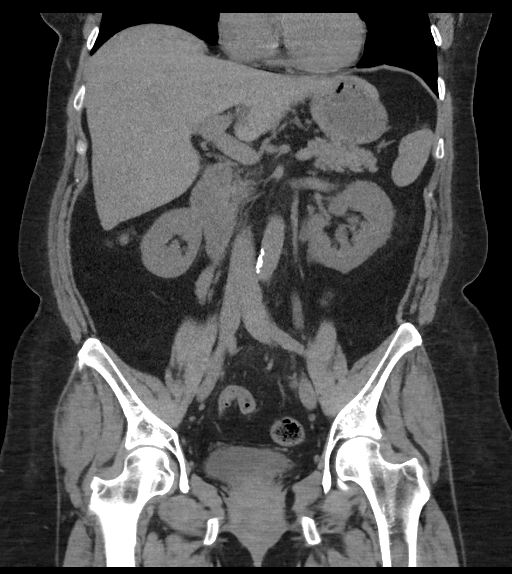

[16 of 46 positions shown; findings below may reference images not displayed]

FINDINGS: Lower chest: Unremarkable.

Hepatobiliary: No suspicious cystic or solid hepatic lesions are
confidently identified on today's noncontrast CT examination.
Unenhanced appearance of the gallbladder is normal.

Pancreas: No definite pancreatic mass or peripancreatic fluid
collections or inflammatory changes on today's noncontrast CT
examination.

Spleen: Unremarkable.

Adrenals/Urinary Tract: At the left ureterovesicular junction (axial
image 76 of series 2) there is a 2 mm calculus which is associated
with mild proximal left hydroureteronephrosis. No additional calculi
are noted within the collecting system of either kidney, along the
course of either ureter or elsewhere in the urinary bladder.
Unenhanced appearance of the kidneys and bilateral adrenal glands is
otherwise normal. Urinary bladder is otherwise normal in appearance.

Stomach/Bowel: Unenhanced appearance of the stomach is normal. No
pathologic dilatation of small bowel or colon. A few scattered
colonic diverticulae are noted, without surrounding inflammatory
changes to suggest an acute diverticulitis at this time. Normal
appendix.

Vascular/Lymphatic: Aortic atherosclerosis. Numerous venous
collaterals are noted in the anterior abdominal wall. No
lymphadenopathy noted in the abdomen or pelvis.

Reproductive: Status post hysterectomy. Ovaries are unremarkable in
appearance.

Other: Small periumbilical ventral hernia containing a small amount
of omental fat. No significant volume of ascites. No
pneumoperitoneum.

Musculoskeletal: There are no aggressive appearing lytic or blastic
lesions noted in the visualized portions of the skeleton.
IMPRESSION: 1. 2 mm calculus at the left ureterovesicular junction with mild
proximal left hydroureteronephrosis indicative of obstruction.
2. Mild colonic diverticulosis without evidence of acute
diverticulitis at this time.
3. Small periumbilical ventral hernia containing a small amount of
omental fat.
4. Aortic atherosclerosis.

## 2022-04-20 ENCOUNTER — Ambulatory Visit: Payer: Commercial Managed Care - HMO

## 2022-04-24 ENCOUNTER — Other Ambulatory Visit: Payer: Self-pay | Admitting: Internal Medicine

## 2022-04-24 DIAGNOSIS — R0982 Postnasal drip: Secondary | ICD-10-CM

## 2022-04-24 NOTE — Telephone Encounter (Unsigned)
Copied from CRM [id number]. Topic: General - Other >> Apr 24, 2022 10:19 AM Kara Hall wrote: Reason for CRM: Medication Refill - Medication: loratadine (CLARITIN) 10 MG tablet [[id number]]  Has the patient contacted their pharmacy? Yes.   (Agent: If no, request that the patient contact the pharmacy for the refill. If patient does not wish to contact the pharmacy document the reason why and proceed with request.) (Agent: If yes, when and what did the pharmacy advise?)  Preferred Pharmacy (with phone number or street name): [facility] [address], [place] - [address] [place] [address] Phone: [phone] Fax: [phone] Hours: Not open 24 hours   Has the patient been seen for an appointment in the last year OR does the patient have an upcoming appointment? Yes.    Agent: Please be advised that RX refills may take up to 3 business days. We ask that you follow-up with your pharmacy.

## 2022-04-25 MED ORDER — LORATADINE 10 MG PO TABS: 10.0000 mg | ORAL_TABLET | Freq: Every day | ORAL | 0 refills | Status: DC

## 2022-04-25 NOTE — Telephone Encounter (Signed)
Requested Prescriptions  Pending Prescriptions Disp Refills   loratadine (CLARITIN) 10 MG tablet 90 tablet 0    Sig: Take 1 tablet (10 mg total) by mouth at bedtime.     Ear, Nose, and Throat:  Antihistamines 2 Passed - 04/24/2022 10:42 AM      Passed - Cr in normal range and within 360 days    Creat  Date Value Ref Range Status  11/23/2014 0.48 (L) 0.50 - 1.10 mg/dL Final   Creatinine, Ser  Date Value Ref Range Status  02/16/2022 0.66 0.57 - 1.00 mg/dL Final         Passed - Valid encounter within last 12 months    Recent Outpatient Visits           2 months ago Essential hypertension   [facility] Marcine Matar, MD   6 months ago Essential hypertension   [facility] Marcine Matar, MD   1 year ago Essential hypertension   [place] Marcine Matar, MD   1 year ago Strep throat   [facility] [place], Shea Stakes, NP   1 year ago Essential hypertension   [facility] Drucilla Chalet, RPH-CPP       Future Appointments             In 1 month Laural Benes, Binnie Rail, MD [facility]

## 2022-05-03 ENCOUNTER — Other Ambulatory Visit: Payer: Self-pay | Admitting: Internal Medicine

## 2022-05-03 DIAGNOSIS — K219 Gastro-esophageal reflux disease without esophagitis: Secondary | ICD-10-CM

## 2022-05-25 ENCOUNTER — Other Ambulatory Visit: Payer: Self-pay | Admitting: Internal Medicine

## 2022-05-25 DIAGNOSIS — E782 Mixed hyperlipidemia: Secondary | ICD-10-CM

## 2022-06-08 ENCOUNTER — Other Ambulatory Visit (HOSPITAL_COMMUNITY): Payer: Self-pay | Admitting: Psychiatry

## 2022-06-08 ENCOUNTER — Ambulatory Visit (INDEPENDENT_AMBULATORY_CARE_PROVIDER_SITE_OTHER): Payer: Commercial Managed Care - HMO

## 2022-06-08 ENCOUNTER — Ambulatory Visit: Payer: Commercial Managed Care - HMO | Admitting: Podiatry

## 2022-06-08 ENCOUNTER — Encounter: Payer: Self-pay | Admitting: Podiatry

## 2022-06-08 DIAGNOSIS — M722 Plantar fascial fibromatosis: Secondary | ICD-10-CM

## 2022-06-08 DIAGNOSIS — F411 Generalized anxiety disorder: Secondary | ICD-10-CM

## 2022-06-08 DIAGNOSIS — F331 Major depressive disorder, recurrent, moderate: Secondary | ICD-10-CM

## 2022-06-08 MED ORDER — PREDNISONE 10 MG PO TABS: ORAL_TABLET | ORAL | 0 refills | Status: DC

## 2022-06-08 MED ORDER — TRIAMCINOLONE ACETONIDE 10 MG/ML IJ SUSP
20.0000 mg | Freq: Once | INTRAMUSCULAR | Status: AC
Start: 2022-06-08 — End: 2022-06-08
  Administered 2022-06-08: 20 mg

## 2022-06-12 ENCOUNTER — Telehealth (HOSPITAL_COMMUNITY): Payer: Self-pay

## 2022-06-12 DIAGNOSIS — F331 Major depressive disorder, recurrent, moderate: Secondary | ICD-10-CM

## 2022-06-12 DIAGNOSIS — F411 Generalized anxiety disorder: Secondary | ICD-10-CM

## 2022-06-12 MED ORDER — HYDROXYZINE HCL 10 MG PO TABS
10.0000 mg | ORAL_TABLET | Freq: Every evening | ORAL | 0 refills | Status: DC | PRN
Start: 2022-06-12 — End: 2022-06-18

## 2022-06-12 NOTE — Progress Notes (Signed)
Subjective:   Patient ID: Kara Hall, female   DOB: 52 y.o.   MRN: [id number]   HPI Patient states that she has had chronic pain in both her feet but right now it seems to be the worst in the heels and she had orthotics made several years ago but has not been able to fit them chronic.  States the pain has really started again in the last few months   ROS      Objective:  Physical Exam  Neurovascular status intact exquisite discomfort medial fascial band bilateral heel with fluid buildup and pain into the mid arch and dorsal foot but I am hoping that is secondary to change in gait     Assessment:  Exquisite fascial inflammation bilateral with hopeful compensatory pain     Plan:  H&P reviewed sterile prep injected the plantar fascia bilateral 3 mg Kenalog 5 mg Xylocaine and instructed on supportive shoes physical therapy and reappoint to recheck and bring her orthotics with her  X-rays indicate small spur no indication stress fracture

## 2022-06-12 NOTE — Telephone Encounter (Signed)
Parmacy request for refill of Hydroxyzine 10 mg 1-2 tabs at bedtime, may repeat dose one time if needed for anxiety #30 0 refills. Please review and advise, thank you

## 2022-06-12 NOTE — Telephone Encounter (Signed)
Done

## 2022-06-15 ENCOUNTER — Telehealth (HOSPITAL_COMMUNITY): Payer: Commercial Managed Care - HMO | Admitting: Psychiatry

## 2022-06-18 ENCOUNTER — Telehealth (HOSPITAL_COMMUNITY): Payer: Commercial Managed Care - HMO | Admitting: Psychiatry

## 2022-06-18 ENCOUNTER — Encounter (HOSPITAL_COMMUNITY): Payer: Self-pay | Admitting: Psychiatry

## 2022-06-18 ENCOUNTER — Telehealth (HOSPITAL_BASED_OUTPATIENT_CLINIC_OR_DEPARTMENT_OTHER): Payer: Commercial Managed Care - HMO | Admitting: Psychiatry

## 2022-06-18 VITALS — Wt 190.0 lb

## 2022-06-18 DIAGNOSIS — F411 Generalized anxiety disorder: Secondary | ICD-10-CM

## 2022-06-18 DIAGNOSIS — F331 Major depressive disorder, recurrent, moderate: Secondary | ICD-10-CM | POA: Diagnosis not present

## 2022-06-18 MED ORDER — RISPERIDONE 0.5 MG PO TABS
0.5000 mg | ORAL_TABLET | Freq: Two times a day (BID) | ORAL | 1 refills | Status: DC
Start: 2022-06-18 — End: 2022-09-20

## 2022-06-18 MED ORDER — HYDROXYZINE HCL 10 MG PO TABS
10.0000 mg | ORAL_TABLET | Freq: Every evening | ORAL | 3 refills | Status: DC | PRN
Start: 2022-06-18 — End: 2022-09-20

## 2022-06-18 MED ORDER — BUPROPION HCL ER (XL) 150 MG PO TB24
150.0000 mg | ORAL_TABLET | Freq: Every day | ORAL | 3 refills | Status: DC
Start: 2022-06-18 — End: 2022-09-20

## 2022-06-18 NOTE — Progress Notes (Signed)
[facility] MD Virtual Progress Note   Patient Location: Home Provider Location: Home Office  I connect with patient by telephone and verified that I am speaking with correct person by using two identifiers. I discussed the limitations of evaluation and management by telemedicine and the availability of in person appointments. I also discussed with the patient that there may be a patient responsible charge related to this service. The patient expressed understanding and agreed to proceed.  Kara Hall [id number] 52 y.o.  06/18/2022 3:21 PM  History of Present Illness:  Patient is evaluated by phone session.  She recently got a new phone and her camera is not working.  She has been doing better since medicine dose adjusted.  She takes the hydroxyzine every night and Wellbutrin XL in the morning.  She also takes the risperidone at bedtime but sometimes she does not take the risperidone during the day.  She feels overall things are much better but she still have a lot of family issues.  Her husband not working because the car broke down.  Her younger son is now kicked out from her daughter's mother because he was not doing anything and she got upset.  Now he is staying with his brother.  Patient reported her job is somewhat slow down and she is not as stressed.  She reported for pain and recently seen the podiatrist.  She like to keep the current medicine since it is working well.  She denies any crying spells, feeling of hopelessness or worthlessness.  Her appetite is okay.  Energy level is good.  She has no tremor or shakes or any EPS.  She like to follow-up in 4 months.  Past Psychiatric History: Tried lexapro from psychiatrist made hyper.  Allergic to Trazodone and nefazodone.  Headache from Abilify. H/O impulsive buying and excessive spending. No history of suicidal attempt, psychosis and inpatient treatment.  We tried gabapentin, hydroxyzine, Buspar but discontinued due to  not consistent and poor compliance. Prescribed Naltrexone to help ETOH craving but could not afford.    Outpatient Encounter Medications as of 06/18/2022  Medication Sig   ALPRAZolam (XANAX) 0.5 MG tablet Take 0.5-1 tablets (0.25-0.5 mg total) by mouth daily as needed for anxiety.   amLODipine (NORVASC) 5 MG tablet Take 1 tablet by mouth once daily   buPROPion (WELLBUTRIN XL) 150 MG 24 hr tablet Take 1 tablet (150 mg total) by mouth daily.   butalbital-acetaminophen-caffeine (FIORICET) 50-325-40 MG tablet TAKE 1 TO 2 TABLETS BY MOUTH DAILY AS NEEDED   fluticasone (FLONASE) 50 MCG/ACT nasal spray Place 2 sprays into both nostrils daily.   hydrOXYzine (ATARAX) 10 MG tablet Take 1-2 tablets (10-20 mg total) by mouth at bedtime as needed and may repeat dose one time if needed for anxiety.   levothyroxine (SYNTHROID, LEVOTHROID) 125 MCG tablet Take 125 mcg by mouth daily before breakfast.    loratadine (CLARITIN) 10 MG tablet Take 1 tablet (10 mg total) by mouth at bedtime.   omeprazole (PRILOSEC) 20 MG capsule TAKE 1 CAPSULE(20 MG) BY MOUTH DAILY   pravastatin (PRAVACHOL) 10 MG tablet TAKE 1 TABLET BY MOUTH EVERY MONDAY, WEDNESDAY, AND FRIDAY.   predniSONE (DELTASONE) 10 MG tablet 12 day tapering dose   propranolol (INDERAL) 10 MG tablet Take 1 tablet (10 mg total) by mouth 2 (two) times daily.   risperiDONE (RISPERDAL) 0.5 MG tablet Take 1 tablet (0.5 mg total) by mouth 2 (two) times daily.   valsartan (DIOVAN) 40 MG tablet Take  1 tablet by mouth once daily   Facility-Administered Encounter Medications as of 06/18/2022  Medication   triamcinolone acetonide (KENALOG) 10 MG/ML injection 10 mg    No results found for this or any previous visit (from the past 2160 hour(s)).   Psychiatric Specialty Exam: Physical Exam  Review of Systems  Weight 190 lb (86.2 kg), last menstrual period 01/08/2019.There is no height or weight on file to calculate BMI.  General Appearance: NA  Eye Contact:  NA   Speech:  Normal Rate  Volume:  Normal  Mood:  Euthymic  Affect:  NA  Thought Process:  Goal Directed  Orientation:  Full (Time, Place, and Person)  Thought Content:  WDL  Suicidal Thoughts:  No  Homicidal Thoughts:  No  Memory:  Immediate;   Good Recent;   Fair Remote;   Fair  Judgement:  Fair  Insight:  Shallow  Psychomotor Activity:  NA  Concentration:  Concentration: Fair and Attention Span: Fair  Recall:  Fair  Fund of Knowledge:  Good  Language:  Good  Akathisia:  No  Handed:  Right  AIMS (if indicated):     Assets:  Communication Skills Desire for Improvement Housing Social Support Talents/Skills Transportation  ADL's:  Intact  Cognition:  WNL  Sleep:  better     Assessment/Plan: MDD (major depressive disorder), recurrent episode, moderate (HCC) - Plan: buPROPion (WELLBUTRIN XL) 150 MG 24 hr tablet, risperiDONE (RISPERDAL) 0.5 MG tablet, hydrOXYzine (ATARAX) 10 MG tablet  GAD (generalized anxiety disorder) - Plan: hydrOXYzine (ATARAX) 10 MG tablet  Patient is stable on current medication.  Encouraged to take the medicine as prescribed and not to skip the dose.  Continue risperidone 0.5 mg twice a day as usually patient takes only 1 a day.  Continue hydroxyzine 10 mg at bedtime and Wellbutrin XL 150 mg in the morning.  She is not interested in therapy.  Recommend to call us back if she has any question or any concern.  Follow-up in 4 months.   Follow Up Instructions:     I discussed the assessment and treatment plan with the patient. The patient was provided an opportunity to ask questions and all were answered. The patient agreed with the plan and demonstrated an understanding of the instructions.   The patient was advised to call back or seek an in-person evaluation if the symptoms worsen or if the condition fails to improve as anticipated.    Collaboration of Care: Other provider involved in patient's care AEB notes are present in the epic to  review.  Patient/Guardian was advised Release of Information must be obtained prior to any record release in order to collaborate their care with an outside provider. Patient/Guardian was advised if they have not already done so to contact the registration department to sign all necessary forms in order for [place] to release information regarding their care.   Consent: Patient/Guardian gives verbal consent for treatment and assignment of benefits for services provided during this visit. Patient/Guardian expressed understanding and agreed to proceed.     I provided 20 minutes of non face to face time during this encounter.  Note: This document was prepared by [employer] voice dictation technology and any errors that results from this process are unintentional.    Cleotis Nipper, MD 06/18/2022

## 2022-06-20 ENCOUNTER — Telehealth: Payer: Self-pay | Admitting: Podiatry

## 2022-06-20 ENCOUNTER — Other Ambulatory Visit: Payer: Self-pay | Admitting: Podiatry

## 2022-06-20 MED ORDER — DICLOFENAC SODIUM 75 MG PO TBEC: 75.0000 mg | DELAYED_RELEASE_TABLET | Freq: Two times a day (BID) | ORAL | 2 refills | Status: DC

## 2022-06-20 NOTE — Telephone Encounter (Signed)
Pt stated that she wanted to give the shot a little more time to work but she wanted to know if you could go ahead & send the Rx for the Diclofenac. Please advise.

## 2022-06-20 NOTE — Telephone Encounter (Signed)
Sent in

## 2022-06-22 ENCOUNTER — Ambulatory Visit: Payer: Commercial Managed Care - HMO | Admitting: Podiatry

## 2022-06-22 ENCOUNTER — Ambulatory Visit: Payer: Commercial Managed Care - HMO | Attending: Internal Medicine | Admitting: Internal Medicine

## 2022-06-22 ENCOUNTER — Encounter: Payer: Self-pay | Admitting: Internal Medicine

## 2022-06-22 VITALS — BP 110/70 | HR 70 | Temp 97.9°F | Ht 66.0 in | Wt 196.0 lb

## 2022-06-22 DIAGNOSIS — J069 Acute upper respiratory infection, unspecified: Secondary | ICD-10-CM

## 2022-06-22 DIAGNOSIS — I10 Essential (primary) hypertension: Secondary | ICD-10-CM | POA: Diagnosis not present

## 2022-06-22 DIAGNOSIS — E890 Postprocedural hypothyroidism: Secondary | ICD-10-CM

## 2022-06-22 DIAGNOSIS — F321 Major depressive disorder, single episode, moderate: Secondary | ICD-10-CM | POA: Insufficient documentation

## 2022-06-22 DIAGNOSIS — C73 Malignant neoplasm of thyroid gland: Secondary | ICD-10-CM | POA: Insufficient documentation

## 2022-06-22 DIAGNOSIS — F331 Major depressive disorder, recurrent, moderate: Secondary | ICD-10-CM | POA: Diagnosis not present

## 2022-06-22 DIAGNOSIS — E782 Mixed hyperlipidemia: Secondary | ICD-10-CM

## 2022-06-22 MED ORDER — BENZONATATE 100 MG PO CAPS
100.0000 mg | ORAL_CAPSULE | Freq: Two times a day (BID) | ORAL | 0 refills | Status: DC | PRN
Start: 2022-06-22 — End: 2022-10-26

## 2022-06-22 MED ORDER — PRAVASTATIN SODIUM 10 MG PO TABS
ORAL_TABLET | ORAL | 1 refills | Status: DC
Start: 2022-06-22 — End: 2022-10-30

## 2022-06-22 MED ORDER — AMLODIPINE BESYLATE 5 MG PO TABS: 5.0000 mg | ORAL_TABLET | Freq: Every day | ORAL | 1 refills | Status: DC

## 2022-06-22 MED ORDER — VALSARTAN 40 MG PO TABS
ORAL_TABLET | ORAL | 1 refills | Status: DC
Start: 2022-06-22 — End: 2022-10-26

## 2022-06-22 NOTE — Progress Notes (Signed)
Patient ID: Kara Hall, female    DOB: May 31, 1970  MRN: [id number]  CC: Hypertension (HTN f/u. /Cough, sore throat, hoarse voice, green phlegm X1 week)   Subjective: Kara Hall is a 52 y.o. female who presents for chronic ds management Her concerns today include:  Patient with history of HTN, thyroid cancer (dx age 37, s/p thyroidectomy), anxiety/dep, GERD, lumbar radiculopathy, HL.   HTN:  compliant with meds which are amlodipine 5 mg daily and Diovan 40 mg daily. Not checking BP but has device.  She limits salt in the foods. She is taking and tolerating pravastatin 5 days a week for hyperlipidemia.  We had increased the frequency of dosing from 3 days a week to 5 days a week on last visit because her levels were not at goal.  C/o cough productive of green phlegm, sore throat and hoarseness x 1 wk No fever, no SOB  Hx of thyroid CA/hypothyroid: taking levothyroxine 125 mcg daily. Followed by endo, Dr. Debara Pickett.  Had recent level check and reports being told that her levels were good.  Dep/Anx: Plugged into behavioral health services with Dr. Lolly Mustache.  She feels depression is stable on her current medications. Patient Active Problem List   Diagnosis Date Noted   Moderate major depression (HCC) 06/22/2022   Papillary thyroid carcinoma (HCC) 06/22/2022   Mixed hyperlipidemia 08/19/2020   Obesity (BMI 30.0-34.9) 08/19/2020   Stressful life event affecting family 08/19/2020   Plantar fasciitis, bilateral 08/19/2020   Status post hysterectomy 12/18/2018   Prolonged Q-T interval on ECG 11/24/2018   Essential hypertension 11/24/2018   Dyspareunia in female 03/24/2018   Increased body mass index 03/24/2018   Carpal tunnel syndrome on both sides 12/10/2017   Achilles tendon contracture, left 10/04/2016   Displaced fracture of fifth metatarsal bone of left foot with delayed healing 05/07/2016   Bilateral wrist pain 11/28/2015   History of thyroid cancer 08/09/2013    Anxiety 01/12/2013     Current Outpatient Medications on File Prior to Visit  Medication Sig Dispense Refill   ALPRAZolam (XANAX) 0.5 MG tablet Take 0.5-1 tablets (0.25-0.5 mg total) by mouth daily as needed for anxiety. 10 tablet 0   buPROPion (WELLBUTRIN XL) 150 MG 24 hr tablet Take 1 tablet (150 mg total) by mouth daily. 30 tablet 3   butalbital-acetaminophen-caffeine (FIORICET) 50-325-40 MG tablet TAKE 1 TO 2 TABLETS BY MOUTH DAILY AS NEEDED 15 tablet 1   diclofenac (VOLTAREN) 75 MG EC tablet Take 1 tablet (75 mg total) by mouth 2 (two) times daily. 50 tablet 2   fluticasone (FLONASE) 50 MCG/ACT nasal spray Place 2 sprays into both nostrils daily. 16 g 1   hydrOXYzine (ATARAX) 10 MG tablet Take 1 tablet (10 mg total) by mouth at bedtime as needed and may repeat dose one time if needed for anxiety. 30 tablet 3   levothyroxine (SYNTHROID, LEVOTHROID) 125 MCG tablet Take 125 mcg by mouth daily before breakfast.      loratadine (CLARITIN) 10 MG tablet Take 1 tablet (10 mg total) by mouth at bedtime. 90 tablet 0   omeprazole (PRILOSEC) 20 MG capsule TAKE 1 CAPSULE(20 MG) BY MOUTH DAILY 90 capsule 0   predniSONE (DELTASONE) 10 MG tablet 12 day tapering dose 48 tablet 0   propranolol (INDERAL) 10 MG tablet Take 1 tablet (10 mg total) by mouth 2 (two) times daily. 180 tablet 1   risperiDONE (RISPERDAL) 0.5 MG tablet Take 1 tablet (0.5 mg total) by mouth 2 (two)  times daily. 60 tablet 1   Current Facility-Administered Medications on File Prior to Visit  Medication Dose Route Frequency Provider Last Rate Last Admin   triamcinolone acetonide (KENALOG) 10 MG/ML injection 10 mg  10 mg Other Once Asencion Islam, DPM        Allergies  Allergen Reactions   Other     BLOOD REFUSAL    Lipitor [Atorvastatin]     Cramps and headaches   Sulfa Antibiotics Other (See Comments) and Nausea And Vomiting    Severe bladder, and kidney infection   Trazodone And Nefazodone Other (See Comments)    Headache  every am     Social History   Socioeconomic History   Marital status: Married    Spouse name: Arlys John   Number of children: 3   Years of education: 12+   Highest education level: Not on file  Occupational History   Occupation: [employer]   Occupation: Cosmetologist    Employer: [employer]  Tobacco Use   Smoking status: Never   Smokeless tobacco: Never  Vaping Use   Vaping Use: Never used  Substance and Sexual Activity   Alcohol use: Yes    Comment: occ   Drug use: No   Sexual activity: Yes    Partners: Male    Birth control/protection: Surgical    Comment: Tubal ligation  Other Topics Concern   Not on file  Social History Narrative   Divorce from previous husband in 06/2016.   Now remarried 07/2016.   Lives at home with her new husband and cat.   She has three grown children who live with her husband. She has a good relationship with her children.   Her father passed away 08-Jun-2016.   Social Determinants of Health   Financial Resource Strain: Not on file  Food Insecurity: Not on file  Transportation Needs: Not on file  Physical Activity: Not on file  Stress: Not on file  Social Connections: Not on file  Intimate Partner Violence: Not on file    Family History  Problem Relation Age of Onset   Heart disease Mother    Hyperlipidemia Mother    Heart disease Father    Hyperlipidemia Father    Diabetes Father    Heart disease Brother    Hyperlipidemia Brother    Hypertension Brother    Heart disease Brother    Hyperlipidemia Brother    Colon cancer Maternal Grandfather    Esophageal cancer Neg Hx    Stomach cancer Neg Hx    Rectal cancer Neg Hx     Past Surgical History:  Procedure Laterality Date   CYSTOSCOPY N/A 12/18/2018   Procedure: CYSTOSCOPY;  Surgeon: Rande Brunt, MD;  Location: [facility];  Service: Gynecology;  Laterality: N/A;   LAPAROSCOPIC LYSIS OF ADHESIONS N/A 12/18/2018   Procedure: LAPAROSCOPIC LYSIS OF  ADHESIONS;  Surgeon: Rande Brunt, MD;  Location: [facility];  Service: Gynecology;  Laterality: N/A;   ROBOTIC ASSISTED LAPAROSCOPIC HYSTERECTOMY AND SALPINGECTOMY Bilateral 12/18/2018   Procedure: XI ROBOTIC ASSISTED LAPAROSCOPIC HYSTERECTOMY AND BILATERAL SALPINGECTOMY;  Surgeon: Rande Brunt, MD;  Location:  SURGERY CENTER;  Service: Gynecology;  Laterality: Bilateral;   THROMBECTOMY Right 2001   leg; after 3rd child was born; for deep vein thrombosis   THYROIDECTOMY Right 08/28/2013   Procedure: RIGHT THYROID LOBECTOMY POSSIBLE TOTAL THYROIDECTOMY;  Surgeon: Flo Shanks, MD;  Location: [facility] OR;  Service: ENT;  Laterality: Right;   THYROIDECTOMY Left 09/10/2013  Procedure: COMPLETE LEFT THYROIDECTOMY;  Surgeon: Flo Shanks, MD;  Location: [facility] OR;  Service: ENT;  Laterality: Left;   TUBAL LIGATION  2001    ROS: Review of Systems Negative except as stated above  PHYSICAL EXAM: BP 110/70   Pulse 70   Temp 97.9 F (36.6 C) (Oral)   Ht 5\' 6"  (1.676 m)   Wt 196 lb (88.9 kg)   LMP 01/08/2019   SpO2 97%   BMI 31.64 kg/m   Wt Readings from Last 3 Encounters:  06/22/22 196 lb (88.9 kg)  06/18/22 190 lb (86.2 kg)  04/16/22 190 lb (86.2 kg)    Physical Exam  General appearance - alert, well appearing, middle-age Caucasian female and in no distress.  She has slight audible hoarseness. Mental status - normal mood, behavior, speech, dress, motor activity, and thought processes Neck - supple, no significant adenopathy Mouth: Throat is without erythema or exudates. Chest - clear to auscultation, no wheezes, rales or rhonchi, symmetric air entry Heart - normal rate, regular rhythm, normal S1, S2, no murmurs, rubs, clicks or gallops Extremities - peripheral pulses normal, no pedal edema, no clubbing or cyanosis     02/16/2022   12:04 PM 10/16/2021    1:42 PM 04/05/2021    9:46 AM  Depression screen PHQ 2/9  Decreased Interest 2 0   Down,  Depressed, Hopeless 2 0 1  PHQ - 2 Score 4 0 1  Altered sleeping 2    Tired, decreased energy 2    Change in appetite 1    Feeling bad or failure about yourself  0    Trouble concentrating 0    Moving slowly or fidgety/restless 0    Suicidal thoughts 0    PHQ-9 Score 9         Latest Ref Rng & Units 02/16/2022   12:15 PM 08/31/2020    3:02 PM 05/19/2020    9:26 AM  CMP  Glucose 70 - 99 mg/dL 191  91  478   BUN 6 - 24 mg/dL 16  14  14    Creatinine 0.57 - 1.00 mg/dL 2.95  6.21  3.08   Sodium 134 - 144 mmol/L 140  139  136   Potassium 3.5 - 5.2 mmol/L 4.5  3.9  4.3   Chloride 96 - 106 mmol/L 102  103  100   CO2 20 - 29 mmol/L 23  20  20    Calcium 8.7 - 10.2 mg/dL 8.7  8.2  8.7   Total Protein 6.0 - 8.5 g/dL 6.9   7.2   Total Bilirubin 0.0 - 1.2 mg/dL 0.4   0.4   Alkaline Phos 44 - 121 IU/L 70   96   AST 0 - 40 IU/L 13   22   ALT 0 - 32 IU/L 14   29    Lipid Panel     Component Value Date/Time   CHOL 253 (H) 02/16/2022 1215   TRIG 120 02/16/2022 1215   HDL 59 02/16/2022 1215   CHOLHDL 4.3 02/16/2022 1215   CHOLHDL 2.7 11/23/2014 1554   VLDL 11 11/23/2014 1554   LDLCALC 173 (H) 02/16/2022 1215    CBC    Component Value Date/Time   WBC 6.9 02/16/2022 1215   WBC 13.9 (H) 12/19/2018 0812   RBC 4.19 02/16/2022 1215   RBC 3.55 (L) 12/19/2018 0812   HGB 13.6 02/16/2022 1215   HCT 39.1 02/16/2022 1215   PLT 295 02/16/2022 1215   MCV 93  02/16/2022 1215   MCH 32.5 02/16/2022 1215   MCH 28.2 12/19/2018 0812   MCHC 34.8 02/16/2022 1215   MCHC 30.6 12/19/2018 0812   RDW 13.1 02/16/2022 1215   LYMPHSABS 1.5 05/19/2020 0926   MONOABS 0.5 11/23/2014 1554   EOSABS 0.2 05/19/2020 0926   BASOSABS 0.1 05/19/2020 0926    ASSESSMENT AND PLAN:  1. Essential hypertension At goal.  Continue amlodipine and Diovan. - amLODipine (NORVASC) 5 MG tablet; Take 1 tablet (5 mg total) by mouth daily.  Dispense: 90 tablet; Refill: 1 - valsartan (DIOVAN) 40 MG tablet; Take 1 tablet by mouth  once daily  Dispense: 90 tablet; Refill: 1  2. Viral upper respiratory tract infection Recommend gargling with some warm water or drinking warm tea to help with the hoarseness. - benzonatate (TESSALON) 100 MG capsule; Take 1 capsule (100 mg total) by mouth 2 (two) times daily as needed for cough.  Dispense: 20 capsule; Refill: 0  3. Mixed hyperlipidemia Continue pravastatin. - pravastatin (PRAVACHOL) 10 MG tablet; TAKE 1 TABLET BY MOUTH EVERY MONDAY, WEDNESDAY, AND FRIDAY.  Dispense: 38 tablet; Refill: 1  4. Moderate recurrent major depression (HCC) Plugged in with behavioral health.  5.  Postsurgical hypothyroidism. On levothyroxine and followed by endocrinology.   Patient was given the opportunity to ask questions.  Patient verbalized understanding of the plan and was able to repeat key elements of the plan.   This documentation was completed using Paediatric nurse.  Any transcriptional errors are unintentional.  No orders of the defined types were placed in this encounter.    Requested Prescriptions   Signed Prescriptions Disp Refills   benzonatate (TESSALON) 100 MG capsule 20 capsule 0    Sig: Take 1 capsule (100 mg total) by mouth 2 (two) times daily as needed for cough.   amLODipine (NORVASC) 5 MG tablet 90 tablet 1    Sig: Take 1 tablet (5 mg total) by mouth daily.   pravastatin (PRAVACHOL) 10 MG tablet 38 tablet 1    Sig: TAKE 1 TABLET BY MOUTH EVERY MONDAY, WEDNESDAY, AND FRIDAY.   valsartan (DIOVAN) 40 MG tablet 90 tablet 1    Sig: Take 1 tablet by mouth once daily    Return in about 4 months (around 10/22/2022).  Jonah Blue, MD, FACP

## 2022-07-02 ENCOUNTER — Ambulatory Visit: Payer: Self-pay | Admitting: *Deleted

## 2022-07-02 NOTE — Telephone Encounter (Signed)
Chief Complaint: Cough Symptoms: Right ear ache, stuffy nose Frequency: Ongoing since 06/15/22 Pertinent Negatives: Patient denies SOB, CP Disposition: [] ED /[] Urgent Care (no appt availability in office) / [x] Appointment(In office/virtual)/ []  [facility]/ [] Home Care/ [x] Refused Recommended Disposition /[] [facility]/ []  Follow-up with PCP Additional Notes: Advised evaluation by a provider, no availability in the office. She says she feels too badk to come in the office. I offered virtual UC, she declines saying she just wants Dr. Laural Benes to call in something stronger than tessalon pearles as they have not worked for the cough. She says the sputum coughed up is green and green when blow nose. She says the coughing is really bad, OTC is not helping. Advised I will send this to Dr. Laural Benes and someone will call with her recommendation.    Summary: constant cough, sore throat   No fever, 2 weeks constantly coughing, sometimes choking cough, sore throat,  benzonatate (TESSALON) 100 MG do not work .Marland KitchenPlease advise         Reason for Disposition  [1] Continuous (nonstop) coughing interferes with work or school AND [2] no improvement using cough treatment per Care Advice  Answer Assessment - Initial Assessment Questions 1. ONSET: "When did the cough begin?"      06/15/22 2. SEVERITY: "How bad is the cough today?"      Bad 3. SPUTUM: "Describe the color of your sputum" (none, dry cough; clear, white, yellow, green)     Green coughing up 4. HEMOPTYSIS: "Are you coughing up any blood?" If so ask: "How much?" (flecks, streaks, tablespoons, etc.)     No 5. DIFFICULTY BREATHING: "Are you having difficulty breathing?" If Yes, ask: "How bad is it?" (e.g., mild, moderate, severe)    - MILD: No SOB at rest, mild SOB with walking, speaks normally in sentences, can lie down, no retractions, pulse < 100.    - MODERATE: SOB at rest, SOB with minimal exertion and prefers to sit, cannot  lie down flat, speaks in phrases, mild retractions, audible wheezing, pulse 100-120.    - SEVERE: Very SOB at rest, speaks in single words, struggling to breathe, sitting hunched forward, retractions, pulse > 120      No 6. FEVER: "Do you have a fever?" If Yes, ask: "What is your temperature, how was it measured, and when did it start?"     No 7. CARDIAC HISTORY: "Do you have any history of heart disease?" (e.g., heart attack, congestive heart failure)      No 8. LUNG HISTORY: "Do you have any history of lung disease?"  (e.g., pulmonary embolus, asthma, emphysema)     No 9. PE RISK FACTORS: "Do you have a history of blood clots?" (or: recent major surgery, recent prolonged travel, bedridden)     No 10. OTHER SYMPTOMS: "Do you have any other symptoms?" (e.g., runny nose, wheezing, chest pain)       Stuffy nose, right ear aches at times  Protocols used: Cough - Acute Non-Productive-A-AH

## 2022-07-02 NOTE — Telephone Encounter (Signed)
Message from Cory Munch sent at 07/02/2022  2:51 PM EDT  Summary: constant cough, sore throat   No fever, 2 weeks constantly coughing, sometimes choking cough, sore throat,  benzonatate (TESSALON) 100 MG do not work .Marland KitchenPlease advise          Call History   Type Contact Phone/Fax User  07/02/2022 02:47 PM EDT Phone (Incoming) Kara Hall, Kara Hall (Self) [phone] Judie Petit) Cory Munch

## 2022-07-02 NOTE — Telephone Encounter (Signed)
Call placed to patient unable to reach message left on Vm

## 2022-07-02 NOTE — Telephone Encounter (Signed)
Attempted to return her call.   Left a voicemail to call back to discuss symptoms with a nurse. 

## 2022-07-13 ENCOUNTER — Ambulatory Visit: Payer: Commercial Managed Care - HMO | Admitting: Podiatry

## 2022-07-19 ENCOUNTER — Other Ambulatory Visit: Payer: Self-pay | Admitting: Internal Medicine

## 2022-07-19 DIAGNOSIS — R0982 Postnasal drip: Secondary | ICD-10-CM

## 2022-08-01 ENCOUNTER — Other Ambulatory Visit: Payer: Self-pay | Admitting: Internal Medicine

## 2022-08-01 DIAGNOSIS — K219 Gastro-esophageal reflux disease without esophagitis: Secondary | ICD-10-CM

## 2022-08-02 ENCOUNTER — Other Ambulatory Visit (HOSPITAL_COMMUNITY): Payer: Self-pay | Admitting: Psychiatry

## 2022-08-02 DIAGNOSIS — F410 Panic disorder [episodic paroxysmal anxiety] without agoraphobia: Secondary | ICD-10-CM

## 2022-08-02 NOTE — Telephone Encounter (Signed)
I send 10 tab tablets.

## 2022-08-02 NOTE — Telephone Encounter (Signed)
VM received form pt requesting a refill of Xanax 0.5 mg, last prescribed on 04/16/22 for #10 1/2 to 1 tab every day prn. Pt stated that she has been going through some "rough patches" and feels my need change in medication. Writer returned pt call but got VM. LVM to please return call. Pt last visit was on 06/18/22 and f/u scheduled for 10/18/22.

## 2022-09-20 ENCOUNTER — Encounter (HOSPITAL_COMMUNITY): Payer: Self-pay | Admitting: Psychiatry

## 2022-09-20 ENCOUNTER — Telehealth (HOSPITAL_BASED_OUTPATIENT_CLINIC_OR_DEPARTMENT_OTHER): Payer: Commercial Managed Care - HMO | Admitting: Psychiatry

## 2022-09-20 VITALS — Wt 200.0 lb

## 2022-09-20 DIAGNOSIS — F331 Major depressive disorder, recurrent, moderate: Secondary | ICD-10-CM

## 2022-09-20 DIAGNOSIS — F411 Generalized anxiety disorder: Secondary | ICD-10-CM | POA: Diagnosis not present

## 2022-09-20 MED ORDER — BUPROPION HCL ER (XL) 150 MG PO TB24
150.0000 mg | ORAL_TABLET | Freq: Every day | ORAL | 2 refills | Status: DC
Start: 2022-09-20 — End: 2023-02-05

## 2022-09-20 MED ORDER — GABAPENTIN 100 MG PO CAPS
100.0000 mg | ORAL_CAPSULE | Freq: Three times a day (TID) | ORAL | 2 refills | Status: DC
Start: 2022-09-20 — End: 2023-02-05

## 2022-09-20 MED ORDER — RISPERIDONE 0.5 MG PO TABS
0.5000 mg | ORAL_TABLET | Freq: Two times a day (BID) | ORAL | 2 refills | Status: DC
Start: 2022-09-20 — End: 2023-02-05

## 2022-09-20 NOTE — Progress Notes (Signed)
[facility] MD Virtual Progress Note   Patient Location: [facility] Provider Location: Office  I connect with patient by video and verified that I am speaking with correct person by using two identifiers. I discussed the limitations of evaluation and management by telemedicine and the availability of in person appointments. I also discussed with the patient that there may be a patient responsible charge related to this service. The patient expressed understanding and agreed to proceed.  Kara Hall [phone] 52 y.o.  09/20/2022 2:26 PM  History of Present Illness:  Patient is evaluated by video session.  She is at her mother's place because she reported mother needs some help.  Lately her daughter is coming to help her with her 51-month-old baby.  Patient also tried to stop by as mother required some help in ADLs.  She admitted some anxiety and nervousness.  She had a beach trip in the summer and had a good time but her car broke down.  She is relieved that husband is back to work.  Her job is going okay.  She had some challenges but able to manage.  Lately she noticed having back pain and she used to get gabapentin last year that had helped her anxiety sleep and back pain.  She wants to get the gabapentin since it helped a lot but we have recommended to see medical doctor but patient does not want to add expense.  For the same reason she does not want to do therapy.  She denies any suicidal thoughts or homicidal thoughts.  She denies any crying spells.  Since taking the risperidone twice a day she has been doing much better.  Occasionally she takes the Xanax and hydroxyzine.  She is taking Wellbutrin that has been very helpful.  She does go for walking with the dog and her job also requires a lot of walking.  She admitted few pounds weight gain as not watching her calorie intake.  She denies any mania, psychosis or any hallucination.  Past Psychiatric History: Tried lexapro  from psychiatrist made hyper.  Allergic to Trazodone and nefazodone.  Headache from Abilify. H/O impulsive buying and excessive spending. No history of suicidal attempt, psychosis and inpatient treatment.  We tried gabapentin, hydroxyzine, Buspar but discontinued due to not consistent and poor compliance. Prescribed Naltrexone to help ETOH craving but could not afford.  Used to take Xanax and hydroxyzine but it was discontinued due to polypharmacy    Outpatient Encounter Medications as of 09/20/2022  Medication Sig   ALPRAZolam (XANAX) 0.5 MG tablet TAKE 1/2 TO 1 (ONE-HALF TO ONE) TABLET BY MOUTH ONCE DAILY AS NEEDED FOR ANXIETY   amLODipine (NORVASC) 5 MG tablet Take 1 tablet (5 mg total) by mouth daily.   benzonatate (TESSALON) 100 MG capsule Take 1 capsule (100 mg total) by mouth 2 (two) times daily as needed for cough.   buPROPion (WELLBUTRIN XL) 150 MG 24 hr tablet Take 1 tablet (150 mg total) by mouth daily.   butalbital-acetaminophen-caffeine (FIORICET) 50-325-40 MG tablet TAKE 1 TO 2 TABLETS BY MOUTH DAILY AS NEEDED   diclofenac (VOLTAREN) 75 MG EC tablet Take 1 tablet (75 mg total) by mouth 2 (two) times daily.   fluticasone (FLONASE) 50 MCG/ACT nasal spray Place 2 sprays into both nostrils daily.   hydrOXYzine (ATARAX) 10 MG tablet Take 1 tablet (10 mg total) by mouth at bedtime as needed and may repeat dose one time if needed for anxiety.   levothyroxine (SYNTHROID, LEVOTHROID) 125  MCG tablet Take 125 mcg by mouth daily before breakfast.    loratadine (CLARITIN) 10 MG tablet TAKE 1 TABLET BY MOUTH AT BEDTIME   omeprazole (PRILOSEC) 20 MG capsule TAKE 1 CAPSULE(20 MG) BY MOUTH DAILY   pravastatin (PRAVACHOL) 10 MG tablet TAKE 1 TABLET BY MOUTH EVERY MONDAY, WEDNESDAY, AND FRIDAY.   predniSONE (DELTASONE) 10 MG tablet 12 day tapering dose   propranolol (INDERAL) 10 MG tablet Take 1 tablet (10 mg total) by mouth 2 (two) times daily.   risperiDONE (RISPERDAL) 0.5 MG tablet Take 1 tablet (0.5  mg total) by mouth 2 (two) times daily.   valsartan (DIOVAN) 40 MG tablet Take 1 tablet by mouth once daily   Facility-Administered Encounter Medications as of 09/20/2022  Medication   triamcinolone acetonide (KENALOG) 10 MG/ML injection 10 mg    No results found for this or any previous visit (from the past 2160 hour(s)).   Psychiatric Specialty Exam: Physical Exam  Review of Systems  Musculoskeletal:  Positive for back pain.  Neurological:  Positive for numbness.    Weight 200 lb (90.7 kg), last menstrual period 01/08/2019.There is no height or weight on file to calculate BMI.  General Appearance: Casual  Eye Contact:  Fair  Speech:  Slow  Volume:  Decreased  Mood:  Anxious  Affect:  Constricted  Thought Process:  Descriptions of Associations: Intact  Orientation:  Full (Time, Place, and Person)  Thought Content:  WDL  Suicidal Thoughts:  No  Homicidal Thoughts:  No  Memory:  Immediate;   Good Recent;   Good Remote;   Good  Judgement:  Intact  Insight:  Present  Psychomotor Activity:  Decreased  Concentration:  Concentration: Fair and Attention Span: Fair  Recall:  Good  Fund of Knowledge:  Good  Language:  Good  Akathisia:  No  Handed:  Right  AIMS (if indicated):     Assets:  Communication Skills Desire for Improvement Housing Social Support Transportation  ADL's:  Intact  Cognition:  WNL  Sleep:  ok     Assessment/Plan: MDD (major depressive disorder), recurrent episode, moderate (HCC) - Plan: risperiDONE (RISPERDAL) 0.5 MG tablet, buPROPion (WELLBUTRIN XL) 150 MG 24 hr tablet, gabapentin (NEURONTIN) 100 MG capsule  GAD (generalized anxiety disorder) - Plan: gabapentin (NEURONTIN) 100 MG capsule  I review previous records.  She used to take gabapentin 100 mg 3 times a day to help her anxiety and that also helped her back pain.  I recommended to see her primary care but patient does not want to go to specialist for gabapentin.  She preferred her getting  gabapentin from our office.  In the past we have prescribed to help her anxiety.  I recommend to discontinue hydroxyzine and do not take Xanax and we can try gabapentin 100 mg 3 times a day along risperidone 0.5 mg twice a day and Wellbutrin XL 150 mg daily.  So far patient has no side effects including tremor or shakes or any EPS.  I discussed psychosocial stressors and again reminded for therapy that can help her coping skills but patient refused due to financial reason.  Discussed medication side effects and benefits.  Recommend to call us back if she is any question or any concern.  Follow-up in 3 months   Follow Up Instructions:     I discussed the assessment and treatment plan with the patient. The patient was provided an opportunity to ask questions and all were answered. The patient agreed with the plan and  demonstrated an understanding of the instructions.   The patient was advised to call back or seek an in-person evaluation if the symptoms worsen or if the condition fails to improve as anticipated.    Collaboration of Care: Other provider involved in patient's care AEB notes are available in epic to review.  Patient/Guardian was advised Release of Information must be obtained prior to any record release in order to collaborate their care with an outside provider. Patient/Guardian was advised if they have not already done so to contact the registration department to sign all necessary forms in order for [place] to release information regarding their care.   Consent: Patient/Guardian gives verbal consent for treatment and assignment of benefits for services provided during this visit. Patient/Guardian expressed understanding and agreed to proceed.     I provided 26 minutes of non face to face time during this encounter.  Note: This document was prepared by [employer] voice dictation technology and any errors that results from this process are unintentional.    Cleotis Nipper, MD 09/20/2022

## 2022-09-22 ENCOUNTER — Other Ambulatory Visit: Payer: Self-pay | Admitting: Podiatry

## 2022-10-02 ENCOUNTER — Other Ambulatory Visit: Payer: Self-pay | Admitting: Internal Medicine

## 2022-10-02 DIAGNOSIS — K219 Gastro-esophageal reflux disease without esophagitis: Secondary | ICD-10-CM

## 2022-10-03 NOTE — Telephone Encounter (Signed)
Last RF 08/01/22 #90 1 RF  Requested Prescriptions  Refused Prescriptions Disp Refills   omeprazole (PRILOSEC) 20 MG capsule [Pharmacy Med Name: OMEPRAZOLE 20MG  CAPSULES] 30 capsule     Sig: TAKE 1 CAPSULE(20 MG) BY MOUTH DAILY     Gastroenterology: Proton Pump Inhibitors Passed - 10/02/2022  9:40 AM      Passed - Valid encounter within last 12 months    Recent Outpatient Visits           3 months ago Essential hypertension   [place] [facility] Marcine Matar, MD   7 months ago Essential hypertension   [place] [facility] Marcine Matar, MD   11 months ago Essential hypertension   [place] [facility] Marcine Matar, MD   1 year ago Essential hypertension   [place] [facility] Marcine Matar, MD   1 year ago Strep throat   [place] [facility] Claiborne Rigg, NP       Future Appointments             In 3 weeks Marcine Matar, MD [facility]

## 2022-10-15 ENCOUNTER — Other Ambulatory Visit: Payer: Self-pay | Admitting: Internal Medicine

## 2022-10-15 DIAGNOSIS — R0982 Postnasal drip: Secondary | ICD-10-CM

## 2022-10-15 DIAGNOSIS — G43009 Migraine without aura, not intractable, without status migrainosus: Secondary | ICD-10-CM

## 2022-10-16 NOTE — Telephone Encounter (Signed)
Requested Prescriptions  Pending Prescriptions Disp Refills   loratadine (CLARITIN) 10 MG tablet [Pharmacy Med Name: Loratadine 10 MG Oral Tablet] 90 tablet 2    Sig: TAKE 1 TABLET BY MOUTH AT BEDTIME     Ear, Nose, and Throat:  Antihistamines 2 Passed - 10/15/2022  8:41 AM      Passed - Cr in normal range and within 360 days    Creat  Date Value Ref Range Status  11/23/2014 0.48 (L) 0.50 - 1.10 mg/dL Final   Creatinine, Ser  Date Value Ref Range Status  02/16/2022 0.66 0.57 - 1.00 mg/dL Final         Passed - Valid encounter within last 12 months    Recent Outpatient Visits           3 months ago Essential hypertension   [place] [facility] Marcine Matar, MD   8 months ago Essential hypertension   [place] [facility] Marcine Matar, MD   1 year ago Essential hypertension   [place] [facility] Marcine Matar, MD   1 year ago Essential hypertension   [place] [facility] Marcine Matar, MD   1 year ago Strep throat   [place] [facility] [place], Shea Stakes, NP       Future Appointments             In 1 week Marcine Matar, MD [facility]             propranolol (INDERAL) 10 MG tablet [Pharmacy Med Name: Propranolol HCl 10 MG Oral Tablet] 180 tablet 0    Sig: Take 1 tablet by mouth twice daily     Cardiovascular:  Beta Blockers Passed - 10/15/2022  8:41 AM      Passed - Last BP in normal range    BP Readings from Last 1 Encounters:  06/22/22 110/70         Passed - Last Heart Rate in normal range    Pulse Readings from Last 1 Encounters:  06/22/22 70         Passed - Valid encounter within last 6 months    Recent Outpatient Visits           3 months ago Essential hypertension   [facility] Marcine Matar, MD   8 months ago Essential  hypertension   [place] [facility] Marcine Matar, MD   1 year ago Essential hypertension   [place] [facility] Marcine Matar, MD   1 year ago Essential hypertension   [place] [facility] Marcine Matar, MD   1 year ago Strep throat    [facility] [place], Shea Stakes, NP       Future Appointments             In 1 week Marcine Matar, MD [facility]

## 2022-10-18 ENCOUNTER — Telehealth (HOSPITAL_COMMUNITY): Payer: Commercial Managed Care - HMO | Admitting: Psychiatry

## 2022-10-26 ENCOUNTER — Encounter: Payer: Self-pay | Admitting: Internal Medicine

## 2022-10-26 ENCOUNTER — Ambulatory Visit: Payer: Managed Care, Other (non HMO) | Attending: Internal Medicine | Admitting: Internal Medicine

## 2022-10-26 VITALS — BP 128/86 | HR 64 | Temp 97.9°F | Ht 66.0 in | Wt 205.0 lb

## 2022-10-26 DIAGNOSIS — F331 Major depressive disorder, recurrent, moderate: Secondary | ICD-10-CM

## 2022-10-26 DIAGNOSIS — E6609 Other obesity due to excess calories: Secondary | ICD-10-CM

## 2022-10-26 DIAGNOSIS — G43009 Migraine without aura, not intractable, without status migrainosus: Secondary | ICD-10-CM

## 2022-10-26 DIAGNOSIS — E66811 Obesity, class 1: Secondary | ICD-10-CM | POA: Diagnosis not present

## 2022-10-26 DIAGNOSIS — Z23 Encounter for immunization: Secondary | ICD-10-CM | POA: Diagnosis not present

## 2022-10-26 DIAGNOSIS — Z1231 Encounter for screening mammogram for malignant neoplasm of breast: Secondary | ICD-10-CM

## 2022-10-26 DIAGNOSIS — I10 Essential (primary) hypertension: Secondary | ICD-10-CM

## 2022-10-26 DIAGNOSIS — Z6833 Body mass index (BMI) 33.0-33.9, adult: Secondary | ICD-10-CM

## 2022-10-26 MED ORDER — VALSARTAN 40 MG PO TABS
ORAL_TABLET | ORAL | 2 refills | Status: DC
Start: 2022-10-26 — End: 2023-04-12

## 2022-10-26 MED ORDER — ZOSTER VAC RECOMB ADJUVANTED 50 MCG/0.5ML IM SUSR
0.5000 mL | Freq: Once | INTRAMUSCULAR | 0 refills | Status: AC
Start: 2022-10-26 — End: 2022-10-26

## 2022-10-26 MED ORDER — AMLODIPINE BESYLATE 10 MG PO TABS
10.0000 mg | ORAL_TABLET | Freq: Every day | ORAL | 2 refills | Status: DC
Start: 2022-10-26 — End: 2023-04-12

## 2022-10-26 MED ORDER — PROPRANOLOL HCL 10 MG PO TABS
10.0000 mg | ORAL_TABLET | Freq: Two times a day (BID) | ORAL | 2 refills | Status: DC
Start: 2022-10-26 — End: 2023-04-12

## 2022-10-26 NOTE — Patient Instructions (Signed)
Increase amlodipine to 10 mg daily.  I have resubmitted the referral for your mammogram.  Please try to make time to get this done.  Try to focus on meal planning and taking lunch from home so that you are not having to eat fast food.

## 2022-10-26 NOTE — Progress Notes (Signed)
Patient ID: Kara Hall, female    DOB: April 16, 1970  MRN: [id number]  CC: Hypertension (HTN f/u. Med refills /No questions / concerns/Yes to flu vax. )   Subjective: Kara Hall is a 52 y.o. female who presents for chronic ds management. Her concerns today include:  Patient with history of HTN, thyroid cancer (dx age 25, s/p thyroidectomy), anxiety/dep, GERD, lumbar radiculopathy, HL.   HTN:  compliant with meds which are amlodipine 5 mg daily and Diovan 40 mg daily. Also on Inderal 10 mg 2 x day more so for migraines.  Took already for today Not checking BP but has device.   She limits salt in the foods.  Obesity:  does very physical work at [employer].  Does a lot of walking, unload boxes.  Also helping to take care of her mom who has dementia.  Mom wants to eat fast foods every day which pt has to pick up for her.  So pt ends up eating the same.  Gained 10 lbs since last visit 06/2022.  Pt and husband do try to cook sometimes.   Dep/Anx: Plugged into behavioral health services with Dr. Lolly Mustache.  She feels depression is stable on her current medications which are risperidone, gabapentin and Wellbutrin.Marland Kitchen   HM:  yes to flu vaccine, wants Zoster vaccine but not today.  Referred for MMG 02/2022 but reports she has not find the time.  Patient Active Problem List   Diagnosis Date Noted   Moderate major depression (HCC) 06/22/2022   Mixed hyperlipidemia 08/19/2020   Obesity (BMI 30.0-34.9) 08/19/2020   Stressful life event affecting family 08/19/2020   Plantar fasciitis, bilateral 08/19/2020   Status post hysterectomy 12/18/2018   Prolonged Q-T interval on ECG 11/24/2018   Essential hypertension 11/24/2018   Dyspareunia in female 03/24/2018   Increased body mass index 03/24/2018   Carpal tunnel syndrome on both sides 12/10/2017   Achilles tendon contracture, left 10/04/2016   Displaced fracture of fifth metatarsal bone of left foot with delayed healing 05/07/2016    Bilateral wrist pain 11/28/2015   History of thyroid cancer 08/09/2013   Anxiety 01/12/2013     Current Outpatient Medications on File Prior to Visit  Medication Sig Dispense Refill   buPROPion (WELLBUTRIN XL) 150 MG 24 hr tablet Take 1 tablet (150 mg total) by mouth daily. 30 tablet 2   butalbital-acetaminophen-caffeine (FIORICET) 50-325-40 MG tablet TAKE 1 TO 2 TABLETS BY MOUTH DAILY AS NEEDED 15 tablet 1   diclofenac (VOLTAREN) 75 MG EC tablet Take 1 tablet by mouth twice daily 50 tablet 0   fluticasone (FLONASE) 50 MCG/ACT nasal spray Place 2 sprays into both nostrils daily. 16 g 1   gabapentin (NEURONTIN) 100 MG capsule Take 1 capsule (100 mg total) by mouth 3 (three) times daily. 90 capsule 2   levothyroxine (SYNTHROID, LEVOTHROID) 125 MCG tablet Take 125 mcg by mouth daily before breakfast.      loratadine (CLARITIN) 10 MG tablet TAKE 1 TABLET BY MOUTH AT BEDTIME 90 tablet 2   omeprazole (PRILOSEC) 20 MG capsule TAKE 1 CAPSULE(20 MG) BY MOUTH DAILY 90 capsule 1   pravastatin (PRAVACHOL) 10 MG tablet TAKE 1 TABLET BY MOUTH EVERY MONDAY, WEDNESDAY, AND FRIDAY. 38 tablet 1   risperiDONE (RISPERDAL) 0.5 MG tablet Take 1 tablet (0.5 mg total) by mouth 2 (two) times daily. 60 tablet 2   Current Facility-Administered Medications on File Prior to Visit  Medication Dose Route Frequency Provider Last Rate Last Admin  triamcinolone acetonide (KENALOG) 10 MG/ML injection 10 mg  10 mg Other Once Asencion Islam, DPM        Allergies  Allergen Reactions   Other     BLOOD REFUSAL    Lipitor [Atorvastatin]     Cramps and headaches   Sulfa Antibiotics Other (See Comments) and Nausea And Vomiting    Severe bladder, and kidney infection   Trazodone And Nefazodone Other (See Comments)    Headache every am     Social History   Socioeconomic History   Marital status: Married    Spouse name: Arlys John   Number of children: 3   Years of education: 12+   Highest education level: Not on file   Occupational History   Occupation: [employer]   Occupation: Cosmetologist    Employer: [employer]  Tobacco Use   Smoking status: Never   Smokeless tobacco: Never  Vaping Use   Vaping status: Never Used  Substance and Sexual Activity   Alcohol use: Yes    Comment: occ   Drug use: No   Sexual activity: Yes    Partners: Male    Birth control/protection: Surgical    Comment: Tubal ligation  Other Topics Concern   Not on file  Social History Narrative   Divorce from previous husband in 06/2016.   Now remarried 07/2016.   Lives at home with her new husband and cat.   She has three grown children who live with her husband. She has a good relationship with her children.   Her father passed away 2016/05/05.   Social Determinants of Health   Financial Resource Strain: Medium Risk (10/26/2022)   Overall Financial Resource Strain (CARDIA)    Difficulty of Paying Living Expenses: Somewhat hard  Food Insecurity: No Food Insecurity (10/26/2022)   Hunger Vital Sign    Worried About Running Out of Food in the Last Year: Never true    Ran Out of Food in the Last Year: Never true  Transportation Needs: No Transportation Needs (10/26/2022)   PRAPARE - Administrator, Civil Service (Medical): No    Lack of Transportation (Non-Medical): No  Physical Activity: Sufficiently Active (10/26/2022)   Exercise Vital Sign    Days of Exercise per Week: 5 days    Minutes of Exercise per Session: 30 min  Stress: No Stress Concern Present (10/26/2022)   [employer] of Occupational Health - Occupational Stress Questionnaire    Feeling of Stress : Not at all  Social Connections: Moderately Isolated (10/26/2022)   Social Connection and Isolation Panel [NHANES]    Frequency of Communication with Friends and Family: More than three times a week    Frequency of Social Gatherings with Friends and Family: More than three times a week    Attends Religious Services: Never    Doctor, general practice or Organizations: No    Attends Banker Meetings: Never    Marital Status: Married  Catering manager Violence: Not At Risk (10/26/2022)   Humiliation, Afraid, Rape, and Kick questionnaire    Fear of Current or Ex-Partner: No    Emotionally Abused: No    Physically Abused: No    Sexually Abused: No    Family History  Problem Relation Age of Onset   Heart disease Mother    Hyperlipidemia Mother    Heart disease Father    Hyperlipidemia Father    Diabetes Father    Heart disease Brother    Hyperlipidemia Brother  Hypertension Brother    Heart disease Brother    Hyperlipidemia Brother    Colon cancer Maternal Grandfather    Esophageal cancer Neg Hx    Stomach cancer Neg Hx    Rectal cancer Neg Hx     Past Surgical History:  Procedure Laterality Date   CYSTOSCOPY N/A 12/18/2018   Procedure: CYSTOSCOPY;  Surgeon: Rande Brunt, MD;  Location: [facility];  Service: Gynecology;  Laterality: N/A;   LAPAROSCOPIC LYSIS OF ADHESIONS N/A 12/18/2018   Procedure: LAPAROSCOPIC LYSIS OF ADHESIONS;  Surgeon: Rande Brunt, MD;  Location: [facility];  Service: Gynecology;  Laterality: N/A;   ROBOTIC ASSISTED LAPAROSCOPIC HYSTERECTOMY AND SALPINGECTOMY Bilateral 12/18/2018   Procedure: XI ROBOTIC ASSISTED LAPAROSCOPIC HYSTERECTOMY AND BILATERAL SALPINGECTOMY;  Surgeon: Rande Brunt, MD;  Location:  SURGERY CENTER;  Service: Gynecology;  Laterality: Bilateral;   THROMBECTOMY Right 2001   leg; after 3rd child was born; for deep vein thrombosis   THYROIDECTOMY Right 08/28/2013   Procedure: RIGHT THYROID LOBECTOMY POSSIBLE TOTAL THYROIDECTOMY;  Surgeon: Flo Shanks, MD;  Location: [facility];  Service: ENT;  Laterality: Right;   THYROIDECTOMY Left 09/10/2013   Procedure: COMPLETE LEFT THYROIDECTOMY;  Surgeon: Flo Shanks, MD;  Location: [facility];  Service: ENT;  Laterality: Left;   TUBAL LIGATION  2001     ROS: Review of Systems Negative except as stated above  PHYSICAL EXAM: BP 128/86   Pulse 64   Temp 97.9 F (36.6 C) (Oral)   Ht 5\' 6"  (1.676 m)   Wt 205 lb (93 kg)   LMP 01/08/2019   SpO2 96%   BMI 33.09 kg/m   Wt Readings from Last 3 Encounters:  10/26/22 205 lb (93 kg)  06/22/22 196 lb (88.9 kg)  02/16/22 195 lb (88.5 kg)    Physical Exam  General appearance - alert, well appearing, middle-age Caucasian female and in no distress Mental status - normal mood, behavior, speech, dress, motor activity, and thought processes Neck - supple, no significant adenopathy Chest - clear to auscultation, no wheezes, rales or rhonchi, symmetric air entry Heart - normal rate, regular rhythm, normal S1, S2, no murmurs, rubs, clicks or gallops Extremities - peripheral pulses normal, no pedal edema, no clubbing or cyanosis     02/16/2022   12:04 PM 10/16/2021    1:42 PM 04/05/2021    9:46 AM  Depression screen PHQ 2/9  Decreased Interest 2 0   Down, Depressed, Hopeless 2 0   PHQ - 2 Score 4 0   Altered sleeping 2    Tired, decreased energy 2    Change in appetite 1    Feeling bad or failure about yourself  0    Trouble concentrating 0    Moving slowly or fidgety/restless 0    Suicidal thoughts 0    PHQ-9 Score 9       Information is confidential and restricted. Go to Review Flowsheets to unlock data.       Latest Ref Rng & Units 02/16/2022   12:15 PM 08/31/2020    3:02 PM 05/19/2020    9:26 AM  CMP  Glucose 70 - 99 mg/dL 161  91  096   BUN 6 - 24 mg/dL 16  14  14    Creatinine 0.57 - 1.00 mg/dL 0.45  4.09  8.11   Sodium 134 - 144 mmol/L 140  139  136   Potassium 3.5 - 5.2 mmol/L 4.5  3.9  4.3  Chloride 96 - 106 mmol/L 102  103  100   CO2 20 - 29 mmol/L 23  20  20    Calcium 8.7 - 10.2 mg/dL 8.7  8.2  8.7   Total Protein 6.0 - 8.5 g/dL 6.9   7.2   Total Bilirubin 0.0 - 1.2 mg/dL 0.4   0.4   Alkaline Phos 44 - 121 IU/L 70   96   AST 0 - 40 IU/L 13   22   ALT 0 - 32 IU/L  14   29    Lipid Panel     Component Value Date/Time   CHOL 253 (H) 02/16/2022 1215   TRIG 120 02/16/2022 1215   HDL 59 02/16/2022 1215   CHOLHDL 4.3 02/16/2022 1215   CHOLHDL 2.7 11/23/2014 1554   VLDL 11 11/23/2014 1554   LDLCALC 173 (H) 02/16/2022 1215    CBC    Component Value Date/Time   WBC 6.9 02/16/2022 1215   WBC 13.9 (H) 12/19/2018 0812   RBC 4.19 02/16/2022 1215   RBC 3.55 (L) 12/19/2018 0812   HGB 13.6 02/16/2022 1215   HCT 39.1 02/16/2022 1215   PLT 295 02/16/2022 1215   MCV 93 02/16/2022 1215   MCH 32.5 02/16/2022 1215   MCH 28.2 12/19/2018 0812   MCHC 34.8 02/16/2022 1215   MCHC 30.6 12/19/2018 0812   RDW 13.1 02/16/2022 1215   LYMPHSABS 1.5 05/19/2020 0926   MONOABS 0.5 11/23/2014 1554   EOSABS 0.2 05/19/2020 0926   BASOSABS 0.1 05/19/2020 0926    ASSESSMENT AND PLAN: 1. Essential hypertension Diastolic blood pressure above goal. Increase amlodipine to 10 mg daily. Continue Diovan 40 mg daily and Inderal - amLODipine (NORVASC) 10 MG tablet; Take 1 tablet (10 mg total) by mouth daily.  Dispense: 90 tablet; Refill: 2 - valsartan (DIOVAN) 40 MG tablet; Take 1 tablet by mouth once daily  Dispense: 90 tablet; Refill: 2  2. Class 1 obesity due to excess calories with serious comorbidity and body mass index (BMI) of 33.0 to 33.9 in adult Encouraged her to continue to move as much as she can. Encourage better meal planning and to cut back on fast foods.  3. Moderate recurrent major depression (HCC) Plugged in with behavioral health.  Feels she is doing okay on her current medications listed above.  4. Migraine without aura and without status migrainosus, not intractable Needing refill on Inderal. - propranolol (INDERAL) 10 MG tablet; Take 1 tablet (10 mg total) by mouth 2 (two) times daily.  Dispense: 180 tablet; Refill: 2  5. Encounter for immunization - Flu vaccine trivalent PF, 6mos and older(Flulaval,Afluria,Fluarix,Fluzone)  6. Encounter for  screening mammogram for malignant neoplasm of breast - MM Digital Screening; Future  7. Need for shingles vaccine Printed prescription given to patient for her to take to her pharmacy to get the Shingrix vaccine when she is ready. - Zoster Vaccine Adjuvanted [facility]) injection; Inject 0.5 mLs into the muscle once for 1 dose.  Dispense: 0.5 mL; Refill: 0     Patient was given the opportunity to ask questions.  Patient verbalized understanding of the plan and was able to repeat key elements of the plan.   This documentation was completed using Paediatric nurse.  Any transcriptional errors are unintentional.  Orders Placed This Encounter  Procedures   MM Digital Screening   Flu vaccine trivalent PF, 6mos and older(Flulaval,Afluria,Fluarix,Fluzone)     Requested Prescriptions   Signed Prescriptions Disp Refills   Zoster Vaccine  Adjuvanted [facility]) injection 0.5 mL 0    Sig: Inject 0.5 mLs into the muscle once for 1 dose.   amLODipine (NORVASC) 10 MG tablet 90 tablet 2    Sig: Take 1 tablet (10 mg total) by mouth daily.   valsartan (DIOVAN) 40 MG tablet 90 tablet 2    Sig: Take 1 tablet by mouth once daily   propranolol (INDERAL) 10 MG tablet 180 tablet 2    Sig: Take 1 tablet (10 mg total) by mouth 2 (two) times daily.    Return in about 4 months (around 02/26/2023).  Jonah Blue, MD, FACP

## 2022-10-30 ENCOUNTER — Other Ambulatory Visit: Payer: Self-pay | Admitting: Internal Medicine

## 2022-10-30 DIAGNOSIS — E782 Mixed hyperlipidemia: Secondary | ICD-10-CM

## 2022-10-30 NOTE — Telephone Encounter (Signed)
Requested Prescriptions  Pending Prescriptions Disp Refills   pravastatin (PRAVACHOL) 10 MG tablet [Pharmacy Med Name: Pravastatin Sodium 10 MG Oral Tablet] 60 tablet 0    Sig: TAKE ONE TABLET BY MOUTH FIVE DAYS  A WEEK     Cardiovascular:  Antilipid - Statins Failed - 10/30/2022  6:37 AM      Failed - Lipid Panel in normal range within the last 12 months    Cholesterol, Total  Date Value Ref Range Status  02/16/2022 253 (H) 100 - 199 mg/dL Final   LDL Chol Calc (NIH)  Date Value Ref Range Status  02/16/2022 173 (H) 0 - 99 mg/dL Final   HDL  Date Value Ref Range Status  02/16/2022 59 >39 mg/dL Final   Triglycerides  Date Value Ref Range Status  02/16/2022 120 0 - 149 mg/dL Final         Passed - Patient is not pregnant      Passed - Valid encounter within last 12 months    Recent Outpatient Visits           4 days ago Essential hypertension   [place] [facility] Marcine Matar, MD   4 months ago Essential hypertension   [place] [facility] Marcine Matar, MD   8 months ago Essential hypertension   [place] [facility] Marcine Matar, MD   1 year ago Essential hypertension   [place] [facility] Marcine Matar, MD   1 year ago Essential hypertension   [place] [facility] Marcine Matar, MD       Future Appointments             In 4 months Laural Benes, Binnie Rail, MD [facility]

## 2022-11-28 ENCOUNTER — Ambulatory Visit
Admission: RE | Admit: 2022-11-28 | Discharge: 2022-11-28 | Disposition: A | Discharge status: Home / Self Care | Payer: Managed Care, Other (non HMO) | Source: Ambulatory Visit | Attending: Internal Medicine | Admitting: Internal Medicine

## 2022-11-28 DIAGNOSIS — Z1231 Encounter for screening mammogram for malignant neoplasm of breast: Secondary | ICD-10-CM

## 2023-01-08 ENCOUNTER — Other Ambulatory Visit: Payer: Self-pay | Admitting: Internal Medicine

## 2023-01-08 DIAGNOSIS — E782 Mixed hyperlipidemia: Secondary | ICD-10-CM

## 2023-01-29 ENCOUNTER — Other Ambulatory Visit (HOSPITAL_COMMUNITY): Payer: Self-pay | Admitting: Psychiatry

## 2023-01-29 DIAGNOSIS — F411 Generalized anxiety disorder: Secondary | ICD-10-CM

## 2023-01-29 DIAGNOSIS — F331 Major depressive disorder, recurrent, moderate: Secondary | ICD-10-CM

## 2023-02-01 ENCOUNTER — Other Ambulatory Visit (HOSPITAL_COMMUNITY): Payer: Self-pay | Admitting: Psychiatry

## 2023-02-01 DIAGNOSIS — F411 Generalized anxiety disorder: Secondary | ICD-10-CM

## 2023-02-01 DIAGNOSIS — F331 Major depressive disorder, recurrent, moderate: Secondary | ICD-10-CM

## 2023-02-04 ENCOUNTER — Telehealth (HOSPITAL_COMMUNITY): Payer: Self-pay | Admitting: *Deleted

## 2023-02-04 NOTE — Telephone Encounter (Signed)
Needs to be seen

## 2023-02-04 NOTE — Telephone Encounter (Signed)
Pt has an appointment tomorrow.

## 2023-02-04 NOTE — Telephone Encounter (Signed)
Pt called stating that she needed extra Xanax and a refill of the Neurontin. Pt does not have a f/u appointment scheduled. Writer returned pt call having to LVM advising needs an appointment for refill as per office policy and that the Xanax had been discontinued by provider on previous visit.

## 2023-02-05 ENCOUNTER — Telehealth (HOSPITAL_BASED_OUTPATIENT_CLINIC_OR_DEPARTMENT_OTHER): Payer: Self-pay | Admitting: Psychiatry

## 2023-02-05 ENCOUNTER — Encounter (HOSPITAL_COMMUNITY): Payer: Self-pay | Admitting: Psychiatry

## 2023-02-05 VITALS — Wt 200.0 lb

## 2023-02-05 DIAGNOSIS — F411 Generalized anxiety disorder: Secondary | ICD-10-CM | POA: Diagnosis not present

## 2023-02-05 DIAGNOSIS — F331 Major depressive disorder, recurrent, moderate: Secondary | ICD-10-CM | POA: Diagnosis not present

## 2023-02-05 MED ORDER — GABAPENTIN 100 MG PO CAPS
100.0000 mg | ORAL_CAPSULE | Freq: Four times a day (QID) | ORAL | 2 refills | Status: DC
Start: 2023-02-05 — End: 2023-05-09

## 2023-02-05 MED ORDER — RISPERIDONE 0.5 MG PO TABS
0.5000 mg | ORAL_TABLET | Freq: Two times a day (BID) | ORAL | 2 refills | Status: DC
Start: 2023-02-05 — End: 2023-05-09

## 2023-02-05 MED ORDER — BUPROPION HCL ER (XL) 150 MG PO TB24: 150.0000 mg | ORAL_TABLET | Freq: Every day | ORAL | 2 refills | Status: DC

## 2023-02-05 NOTE — Progress Notes (Signed)
[facility] MD Virtual Progress Note   Patient Location: In Car Provider Location: Home Office  I connect with patient by video and verified that I am speaking with correct person by using two identifiers. I discussed the limitations of evaluation and management by telemedicine and the availability of in person appointments. I also discussed with the patient that there may be a patient responsible charge related to this service. The patient expressed understanding and agreed to proceed.  Kara Hall [id number] 53 y.o.  02/05/2023 11:04 AM  History of Present Illness:  Patient is evaluated by video session.  She was last seen in September.  She admitted ran out of the medication and struggle with anxiety, nervousness.  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  She also take care of her 27 year old mother who is not doing very well and only have 80 pounds weight.  Patient's daughter started going to patient's mother on a regular basis with her 29-year-old child because patient do not have time but she is still trying to see the mother frequently.  She admitted sleep is not good as she have racing thoughts.  She also have back pain.  She noticed that it gabapentin was helping her back pain, sleep and anxiety.  Since she is out of the medication she is not sleeping well.  She denies any crying spells or any feeling of hopelessness or suicidal thoughts.  She reported other stress is they have only 1 car and it is difficult to do the errands.  Recently she had a visit with her primary care and found to have high blood pressure.  Her blood pressure medication does increased.  She has not taken Xanax or hydroxyzine in a while.  She is pleased that weight is stable.  She had Christmas off but it was very brief that she has time off.  Her job requires a lot of hours and she has been very busy at work.  She reported not have drink for a while  because she realized she cannot mix the medication and alcohol together.  She has no tremors, shakes or any EPS.  She denies any mania or psychosis.  She is taking Risperdal and that is helping her sleep and thinking.  She denies any panic attacks.  Past Psychiatric History: Tried lexapro from psychiatrist made hyper.  Allergic to Trazodone and nefazodone.  Headache from Abilify. H/O impulsive buying and excessive spending. No history of suicidal attempt, psychosis and inpatient treatment.  We tried gabapentin, hydroxyzine, Buspar but discontinued due to not consistent and poor compliance. Prescribed Naltrexone to help ETOH craving but could not afford.  Used to take Xanax and hydroxyzine but it was discontinued due to polypharmacy    Outpatient Encounter Medications as of 02/05/2023  Medication Sig   amLODipine (NORVASC) 10 MG tablet Take 1 tablet (10 mg total) by mouth daily.   buPROPion (WELLBUTRIN XL) 150 MG 24 hr tablet Take 1 tablet (150 mg total) by mouth daily.   butalbital-acetaminophen-caffeine (FIORICET) 50-325-40 MG tablet TAKE 1 TO 2 TABLETS BY MOUTH DAILY AS NEEDED   diclofenac (VOLTAREN) 75 MG EC tablet Take 1 tablet by mouth twice daily   fluticasone (FLONASE) 50 MCG/ACT nasal spray Place 2 sprays into both nostrils daily.   gabapentin (NEURONTIN) 100 MG capsule Take 1 capsule (100 mg total) by mouth 3 (three) times daily.   levothyroxine (SYNTHROID, LEVOTHROID) 125 MCG tablet Take 125 mcg by mouth daily  before breakfast.    loratadine (CLARITIN) 10 MG tablet TAKE 1 TABLET BY MOUTH AT BEDTIME   omeprazole (PRILOSEC) 20 MG capsule TAKE 1 CAPSULE(20 MG) BY MOUTH DAILY   pravastatin (PRAVACHOL) 10 MG tablet TAKE 1 TABLET BY MOUTH 5 DAYS A WEEK   propranolol (INDERAL) 10 MG tablet Take 1 tablet (10 mg total) by mouth 2 (two) times daily.   risperiDONE (RISPERDAL) 0.5 MG tablet Take 1 tablet (0.5 mg total) by mouth 2 (two) times daily.   valsartan (DIOVAN) 40 MG tablet Take 1 tablet by  mouth once daily   Facility-Administered Encounter Medications as of 02/05/2023  Medication   triamcinolone acetonide (KENALOG) 10 MG/ML injection 10 mg    No results found for this or any previous visit (from the past 2160 hours).   Psychiatric Specialty Exam: Physical Exam  Review of Systems  Weight 200 lb (90.7 kg), last menstrual period 01/08/2019.There is no height or weight on file to calculate BMI.  General Appearance: Casual  Eye Contact:  Fair  Speech:  Normal Rate  Volume:  Normal  Mood:  Anxious and Dysphoric  Affect:  Congruent  Thought Process:  Goal Directed  Orientation:  Full (Time, Place, and Person)  Thought Content:  Rumination  Suicidal Thoughts:  No  Homicidal Thoughts:  No  Memory:  Immediate;   Good Recent;   Good Remote;   Good  Judgement:  Intact  Insight:  Present  Psychomotor Activity:  Normal  Concentration:  Concentration: Good and Attention Span: Good  Recall:  Good  Fund of Knowledge:  Good  Language:  Good  Akathisia:  No  Handed:  Right  AIMS (if indicated):     Assets:  Communication Skills Desire for Improvement Financial Resources/Insurance Housing Transportation  ADL's:  Intact  Cognition:  WNL  Sleep:  fair     Assessment/Plan: MDD (major depressive disorder), recurrent episode, moderate (HCC) - Plan: buPROPion (WELLBUTRIN XL) 150 MG 24 hr tablet, gabapentin (NEURONTIN) 100 MG capsule, risperiDONE (RISPERDAL) 0.5 MG tablet  GAD (generalized anxiety disorder) - Plan: gabapentin (NEURONTIN) 100 MG capsule  I reviewed notes from primary care.  She is taking higher dose of amlodipine to help her blood pressure.  We discussed risk of noncompliance with medication can cause worsening of symptoms and relapse.  She realized and she will make sure that she will keep the appointment in the future.  We discussed optimizing the gabapentin to take 4 times a day to help with anxiety and nervousness.  She agreed with the plan because it is  also helps her back pain.  We will try gabapentin 100 mg 4 times a day, continue Risperdal 0.5 mg twice a day and Wellbutrin XL 150 mg daily.  So far she has no major concern including tremors, shakes or any EPS.  Discussed medication side effects and benefits.  Recommended to call us back if she is any question or any concern.  Follow-up in 3 months   Follow Up Instructions:     I discussed the assessment and treatment plan with the patient. The patient was provided an opportunity to ask questions and all were answered. The patient agreed with the plan and demonstrated an understanding of the instructions.   The patient was advised to call back or seek an in-person evaluation if the symptoms worsen or if the condition fails to improve as anticipated.    Collaboration of Care: Other provider involved in patient's care AEB notes are available in epic to  review  Patient/Guardian was advised Release of Information must be obtained prior to any record release in order to collaborate their care with an outside provider. Patient/Guardian was advised if they have not already done so to contact the registration department to sign all necessary forms in order for [place] to release information regarding their care.   Consent: Patient/Guardian gives verbal consent for treatment and assignment of benefits for services provided during this visit. Patient/Guardian expressed understanding and agreed to proceed.     I provided 23 minutes of non face to face time during this encounter.  Note: This document was prepared by [employer] voice dictation technology and any errors that results from this process are unintentional.    Cleotis Nipper, MD 02/05/2023

## 2023-03-01 ENCOUNTER — Ambulatory Visit: Payer: Managed Care, Other (non HMO) | Admitting: Internal Medicine

## 2023-03-22 ENCOUNTER — Other Ambulatory Visit: Payer: Self-pay | Admitting: Internal Medicine

## 2023-03-22 DIAGNOSIS — K219 Gastro-esophageal reflux disease without esophagitis: Secondary | ICD-10-CM

## 2023-04-01 ENCOUNTER — Other Ambulatory Visit: Payer: Self-pay | Admitting: Internal Medicine

## 2023-04-01 DIAGNOSIS — E782 Mixed hyperlipidemia: Secondary | ICD-10-CM

## 2023-04-12 ENCOUNTER — Ambulatory Visit: Payer: Managed Care, Other (non HMO) | Attending: Internal Medicine | Admitting: Internal Medicine

## 2023-04-12 ENCOUNTER — Encounter: Payer: Self-pay | Admitting: Internal Medicine

## 2023-04-12 DIAGNOSIS — E782 Mixed hyperlipidemia: Secondary | ICD-10-CM

## 2023-04-12 DIAGNOSIS — G43009 Migraine without aura, not intractable, without status migrainosus: Secondary | ICD-10-CM

## 2023-04-12 DIAGNOSIS — I10 Essential (primary) hypertension: Secondary | ICD-10-CM

## 2023-04-12 MED ORDER — VALSARTAN 40 MG PO TABS: ORAL_TABLET | ORAL | 2 refills | Status: DC

## 2023-04-12 MED ORDER — AMLODIPINE BESYLATE 10 MG PO TABS: 10.0000 mg | ORAL_TABLET | Freq: Every day | ORAL | 2 refills | Status: DC

## 2023-04-12 MED ORDER — PROPRANOLOL HCL 10 MG PO TABS: 10.0000 mg | ORAL_TABLET | Freq: Two times a day (BID) | ORAL | 2 refills | Status: AC

## 2023-04-12 MED ORDER — BUTALBITAL-APAP-CAFFEINE 50-325-40 MG PO TABS: ORAL_TABLET | ORAL | 1 refills | Status: AC

## 2023-04-12 MED ORDER — PRAVASTATIN SODIUM 10 MG PO TABS
ORAL_TABLET | ORAL | 1 refills | Status: DC
Start: 2023-04-12 — End: 2023-04-13

## 2023-04-12 NOTE — Progress Notes (Signed)
 Patient ID: Kara Hall, female    DOB: February 15, 1970  MRN: [id number]  CC: Hypertension (HTN f/u. Med refill. /No questions / concerns /)   Subjective: Kara Hall is a 53 y.o. female who presents for chronic ds management. Her concerns today include:  Patient with history of HTN, thyroid cancer (dx age 25, s/p thyroidectomy), anxiety/dep, GERD, lumbar radiculopathy, HL.    Upset that her insurance will not pay for her appt today. Has a high deductible.  HTN: compliant with meds which are amlodipine 10 mg daily and Diovan 40 mg daily.  -limits salt Obesity: wgh stable.  Can do better laying off sauces.  Staying away from sugary drinks.  Still walks her  dog for 5-7 mins daily.  Does a lot standing and walking at work HL;  taking and tolerating Pravachol  Anx/Dep: Still followed by Dr. Lolly Mustache for dep/anx.  Feels mood stablized on meds but "still gets upset when life happens."  Migraines: She has not had any frequent migraines lately.  However request refills on Fioricet to keep on hand to use when needed.  She is also on propranolol. Patient Active Problem List   Diagnosis Date Noted   Moderate major depression (HCC) 06/22/2022   Mixed hyperlipidemia 08/19/2020   Obesity (BMI 30.0-34.9) 08/19/2020   Stressful life event affecting family 08/19/2020   Plantar fasciitis, bilateral 08/19/2020   Status post hysterectomy 12/18/2018   Prolonged Q-T interval on ECG 11/24/2018   Essential hypertension 11/24/2018   Dyspareunia in female 03/24/2018   Increased body mass index 03/24/2018   Carpal tunnel syndrome on both sides 12/10/2017   Achilles tendon contracture, left 10/04/2016   Displaced fracture of fifth metatarsal bone of left foot with delayed healing 05/07/2016   Bilateral wrist pain 11/28/2015   History of thyroid cancer 08/09/2013   Anxiety 01/12/2013     Current Outpatient Medications on File Prior to Visit  Medication Sig Dispense Refill   buPROPion  (WELLBUTRIN XL) 150 MG 24 hr tablet Take 1 tablet (150 mg total) by mouth daily. 30 tablet 2   diclofenac (VOLTAREN) 75 MG EC tablet Take 1 tablet by mouth twice daily 50 tablet 0   fluticasone (FLONASE) 50 MCG/ACT nasal spray Place 2 sprays into both nostrils daily. 16 g 1   gabapentin (NEURONTIN) 100 MG capsule Take 1 capsule (100 mg total) by mouth 4 (four) times daily. 120 capsule 2   levothyroxine (SYNTHROID, LEVOTHROID) 125 MCG tablet Take 125 mcg by mouth daily before breakfast.      loratadine (CLARITIN) 10 MG tablet TAKE 1 TABLET BY MOUTH AT BEDTIME 90 tablet 2   omeprazole (PRILOSEC) 20 MG capsule TAKE 1 CAPSULE(20 MG) BY MOUTH DAILY 90 capsule 1   risperiDONE (RISPERDAL) 0.5 MG tablet Take 1 tablet (0.5 mg total) by mouth 2 (two) times daily. 60 tablet 2   Current Facility-Administered Medications on File Prior to Visit  Medication Dose Route Frequency Provider Last Rate Last Admin   triamcinolone acetonide (KENALOG) 10 MG/ML injection 10 mg  10 mg Other Once Asencion Islam, DPM        Allergies  Allergen Reactions   Other     BLOOD REFUSAL    Lipitor [Atorvastatin]     Cramps and headaches   Sulfa Antibiotics Other (See Comments) and Nausea And Vomiting    Severe bladder, and kidney infection   Trazodone And Nefazodone Other (See Comments)    Headache every am     Social History  Socioeconomic History   Marital status: Married    Spouse name: Arlys John   Number of children: 3   Years of education: 12+   Highest education level: Not on file  Occupational History   Occupation:  [employer]   Occupation: Cosmetologist    Employer: [employer]  Tobacco Use   Smoking status: Never   Smokeless tobacco: Never  Vaping Use   Vaping status: Never Used  Substance and Sexual Activity   Alcohol use: Yes    Comment: occ   Drug use: No   Sexual activity: Yes    Partners: Male    Birth control/protection: Surgical    Comment: Tubal ligation  Other Topics Concern   Not  on file  Social History Narrative   Divorce from previous husband in 06/2016.   Now remarried 07/2016.   Lives at home with her new husband and cat.   She has three grown children who live with her husband. She has a good relationship with her children.   Her father passed away 31-May-2016.   Social Drivers of Health   Financial Resource Strain: Medium Risk (10/26/2022)   Overall Financial Resource Strain (CARDIA)    Difficulty of Paying Living Expenses: Somewhat hard  Food Insecurity: No Food Insecurity (10/26/2022)   Hunger Vital Sign    Worried About Running Out of Food in the Last Year: Never true    Ran Out of Food in the Last Year: Never true  Transportation Needs: No Transportation Needs (10/26/2022)   PRAPARE - Administrator, Civil Service (Medical): No    Lack of Transportation (Non-Medical): No  Physical Activity: Sufficiently Active (10/26/2022)   Exercise Vital Sign    Days of Exercise per Week: 5 days    Minutes of Exercise per Session: 30 min  Stress: No Stress Concern Present (10/26/2022)   [employer] of Occupational Health - Occupational Stress Questionnaire    Feeling of Stress : Not at all  Social Connections: Moderately Isolated (10/26/2022)   Social Connection and Isolation Panel [NHANES]    Frequency of Communication with Friends and Family: More than three times a week    Frequency of Social Gatherings with Friends and Family: More than three times a week    Attends Religious Services: Never    Database administrator or Organizations: No    Attends Banker Meetings: Never    Marital Status: Married  Catering manager Violence: Not At Risk (10/26/2022)   Humiliation, Afraid, Rape, and Kick questionnaire    Fear of Current or Ex-Partner: No    Emotionally Abused: No    Physically Abused: No    Sexually Abused: No    Family History  Problem Relation Age of Onset   Heart disease Mother    Hyperlipidemia Mother    Heart disease  Father    Hyperlipidemia Father    Diabetes Father    Colon cancer Maternal Grandfather    Breast cancer Cousin 7 - 24   Ovarian cancer Cousin 20 - 29   Heart disease Brother    Hyperlipidemia Brother    Hypertension Brother    Heart disease Brother    Hyperlipidemia Brother    Esophageal cancer Neg Hx    Stomach cancer Neg Hx    Rectal cancer Neg Hx     Past Surgical History:  Procedure Laterality Date   CYSTOSCOPY N/A 12/18/2018   Procedure: CYSTOSCOPY;  Surgeon: Rande Brunt, MD;  Location: [facility];  Service: Gynecology;  Laterality: N/A;   LAPAROSCOPIC LYSIS OF ADHESIONS N/A 12/18/2018   Procedure: LAPAROSCOPIC LYSIS OF ADHESIONS;  Surgeon: Rande Brunt, MD;  Location: [facility];  Service: Gynecology;  Laterality: N/A;   ROBOTIC ASSISTED LAPAROSCOPIC HYSTERECTOMY AND SALPINGECTOMY Bilateral 12/18/2018   Procedure: XI ROBOTIC ASSISTED LAPAROSCOPIC HYSTERECTOMY AND BILATERAL SALPINGECTOMY;  Surgeon: Rande Brunt, MD;  Location: [facility];  Service: Gynecology;  Laterality: Bilateral;   THROMBECTOMY Right 2001   leg; after 3rd child was born; for deep vein thrombosis   THYROIDECTOMY Right 08/28/2013   Procedure: RIGHT THYROID LOBECTOMY POSSIBLE TOTAL THYROIDECTOMY;  Surgeon: Flo Shanks, MD;  Location: [facility] OR;  Service: ENT;  Laterality: Right;   THYROIDECTOMY Left 09/10/2013   Procedure: COMPLETE LEFT THYROIDECTOMY;  Surgeon: Flo Shanks, MD;  Location: [facility] OR;  Service: ENT;  Laterality: Left;   TUBAL LIGATION  2001    ROS: Review of Systems Negative except as stated above  PHYSICAL EXAM: BP 101/70 (BP Location: Left Arm, Patient Position: Sitting, Cuff Size: Large)   Pulse 71   Temp 97.7 F (36.5 C) (Oral)   Ht 5\' 6"  (1.676 m)   Wt 205 lb (93 kg)   LMP 01/08/2019   SpO2 98%   BMI 33.09 kg/m   Wt Readings from Last 3 Encounters:  04/12/23 205 lb (93 kg)  10/26/22 205 lb (93 kg)   06/22/22 196 lb (88.9 kg)    Physical Exam  General appearance - alert, well appearing, and in no distress Mental status -a little tearful when talking about her insurance issue that upset at her today. Neck - supple, no significant adenopathy Chest - clear to auscultation, no wheezes, rales or rhonchi, symmetric air entry Heart - normal rate, regular rhythm, normal S1, S2, no murmurs, rubs, clicks or gallops Extremities - peripheral pulses normal, no pedal edema, no clubbing or cyanosis      Latest Ref Rng & Units 02/16/2022   12:15 PM 08/31/2020    3:02 PM 05/19/2020    9:26 AM  CMP  Glucose 70 - 99 mg/dL 132  91  440   BUN 6 - 24 mg/dL 16  14  14    Creatinine 0.57 - 1.00 mg/dL 1.02  7.25  3.66   Sodium 134 - 144 mmol/L 140  139  136   Potassium 3.5 - 5.2 mmol/L 4.5  3.9  4.3   Chloride 96 - 106 mmol/L 102  103  100   CO2 20 - 29 mmol/L 23  20  20    Calcium 8.7 - 10.2 mg/dL 8.7  8.2  8.7   Total Protein 6.0 - 8.5 g/dL 6.9   7.2   Total Bilirubin 0.0 - 1.2 mg/dL 0.4   0.4   Alkaline Phos 44 - 121 IU/L 70   96   AST 0 - 40 IU/L 13   22   ALT 0 - 32 IU/L 14   29    Lipid Panel     Component Value Date/Time   CHOL 253 (H) 02/16/2022 1215   TRIG 120 02/16/2022 1215   HDL 59 02/16/2022 1215   CHOLHDL 4.3 02/16/2022 1215   CHOLHDL 2.7 11/23/2014 1554   VLDL 11 11/23/2014 1554   LDLCALC 173 (H) 02/16/2022 1215    CBC    Component Value Date/Time   WBC 6.9 02/16/2022 1215   WBC 13.9 (H) 12/19/2018 0812   RBC 4.19 02/16/2022 1215   RBC 3.55 (L)  12/19/2018 0812   HGB 13.6 02/16/2022 1215   HCT 39.1 02/16/2022 1215   PLT 295 02/16/2022 1215   MCV 93 02/16/2022 1215   MCH 32.5 02/16/2022 1215   MCH 28.2 12/19/2018 0812   MCHC 34.8 02/16/2022 1215   MCHC 30.6 12/19/2018 0812   RDW 13.1 02/16/2022 1215   LYMPHSABS 1.5 05/19/2020 0926   MONOABS 0.5 11/23/2014 1554   EOSABS 0.2 05/19/2020 0926   BASOSABS 0.1 05/19/2020 0926    ASSESSMENT AND PLAN: 1. Essential  hypertension At goal.  Continue amlodipine and Diovan - amLODipine (NORVASC) 10 MG tablet; Take 1 tablet (10 mg total) by mouth daily.  Dispense: 90 tablet; Refill: 2 - valsartan (DIOVAN) 40 MG tablet; Take 1 tablet by mouth once daily  Dispense: 90 tablet; Refill: 2 - CBC - Comprehensive metabolic panel with GFR  2. Mixed hyperlipidemia Continue Pravachol. - pravastatin (PRAVACHOL) 10 MG tablet; TAKE 1 TABLET BY MOUTH 5 DAYS A WEEK  Dispense: 60 tablet; Refill: 1 - Lipid panel  3. Migraine without aura and without status migrainosus, not intractable Stable. - propranolol (INDERAL) 10 MG tablet; Take 1 tablet (10 mg total) by mouth 2 (two) times daily.  Dispense: 180 tablet; Refill: 2 - butalbital-acetaminophen-caffeine (FIORICET) 50-325-40 MG tablet; TAKE 1 TO 2 TABLETS BY MOUTH DAILY AS NEEDED  Dispense: 15 tablet; Refill: 1     Patient was given the opportunity to ask questions.  Patient verbalized understanding of the plan and was able to repeat key elements of the plan.   This documentation was completed using Paediatric nurse.  Any transcriptional errors are unintentional.  Orders Placed This Encounter  Procedures   CBC   Comprehensive metabolic panel with GFR   Lipid panel     Requested Prescriptions   Signed Prescriptions Disp Refills   amLODipine (NORVASC) 10 MG tablet 90 tablet 2    Sig: Take 1 tablet (10 mg total) by mouth daily.   pravastatin (PRAVACHOL) 10 MG tablet 60 tablet 1    Sig: TAKE 1 TABLET BY MOUTH 5 DAYS A WEEK   propranolol (INDERAL) 10 MG tablet 180 tablet 2    Sig: Take 1 tablet (10 mg total) by mouth 2 (two) times daily.   valsartan (DIOVAN) 40 MG tablet 90 tablet 2    Sig: Take 1 tablet by mouth once daily   butalbital-acetaminophen-caffeine (FIORICET) 50-325-40 MG tablet 15 tablet 1    Sig: TAKE 1 TO 2 TABLETS BY MOUTH DAILY AS NEEDED    Return in about 4 months (around 08/12/2023).  Jonah Blue, MD, FACP

## 2023-04-12 NOTE — Patient Instructions (Signed)
 Please fax your immunization record to our clinic from [facility]. Our fax number is [phone].

## 2023-04-13 ENCOUNTER — Other Ambulatory Visit: Payer: Self-pay | Admitting: Internal Medicine

## 2023-04-13 DIAGNOSIS — E782 Mixed hyperlipidemia: Secondary | ICD-10-CM

## 2023-04-13 LAB — COMPREHENSIVE METABOLIC PANEL WITH GFR
ALT: 36 IU/L — ABNORMAL HIGH (ref 0–32)
AST: 24 IU/L (ref 0–40)
Albumin: 4.5 g/dL (ref 3.8–4.9)
Alkaline Phosphatase: 83 IU/L (ref 44–121)
BUN/Creatinine Ratio: 20 (ref 9–23)
BUN: 12 mg/dL (ref 6–24)
Bilirubin Total: 0.5 mg/dL (ref 0.0–1.2)
CO2: 22 mmol/L (ref 20–29)
Calcium: 8.9 mg/dL (ref 8.7–10.2)
Chloride: 101 mmol/L (ref 96–106)
Creatinine, Ser: 0.59 mg/dL (ref 0.57–1.00)
Globulin, Total: 2.7 g/dL (ref 1.5–4.5)
Glucose: 101 mg/dL — ABNORMAL HIGH (ref 70–99)
Potassium: 4.1 mmol/L (ref 3.5–5.2)
Sodium: 139 mmol/L (ref 134–144)
Total Protein: 7.2 g/dL (ref 6.0–8.5)
eGFR: 109 mL/min/{1.73_m2} (ref >59)

## 2023-04-13 LAB — CBC
Hematocrit: 40.2 % (ref 34.0–46.6)
Hemoglobin: 13.9 g/dL (ref 11.1–15.9)
MCH: 32.1 pg (ref 26.6–33.0)
MCHC: 34.6 g/dL (ref 31.5–35.7)
MCV: 93 fL (ref 79–97)
Platelets: 291 10*3/uL (ref 150–450)
RBC: 4.33 x10E6/uL (ref 3.77–5.28)
RDW: 12.4 % (ref 11.7–15.4)
WBC: 6.5 10*3/uL (ref 3.4–10.8)

## 2023-04-13 LAB — LIPID PANEL
Chol/HDL Ratio: 4.3 ratio (ref 0.0–4.4)
Cholesterol, Total: 272 mg/dL — ABNORMAL HIGH (ref 100–199)
HDL: 64 mg/dL (ref >39)
LDL Chol Calc (NIH): 191 mg/dL — ABNORMAL HIGH (ref 0–99)
Triglycerides: 101 mg/dL (ref 0–149)
VLDL Cholesterol Cal: 17 mg/dL (ref 5–40)

## 2023-04-13 MED ORDER — PRAVASTATIN SODIUM 20 MG PO TABS: ORAL_TABLET | ORAL | 1 refills | Status: DC

## 2023-04-13 NOTE — Progress Notes (Signed)
 Kidney and liver function tests are good. Cholesterol level quite elevated at 193 with goal being less than 100.  I recommend increasing pravastatin from 10 mg 5 days a week to 20 mg 5 days a week.  An updated prescription has been sent to the pharmacy.  Blood cell counts are normal.

## 2023-04-25 ENCOUNTER — Telehealth: Payer: Self-pay | Admitting: Internal Medicine

## 2023-04-25 NOTE — Telephone Encounter (Signed)
 Copied from CRM [id number]. Topic: Clinical - Medication Question >> Apr 25, 2023  1:57 PM Yolanda T wrote: Reason for CRM: patient called stated her insurance no longer covers loratadine (CLARITIN) 10 MG tablet and she needs to see if provider will prescribe a med that is equivalent to what she was taking. Please f/u with patient.

## 2023-04-25 NOTE — Telephone Encounter (Signed)
 Routing to PCP for review.

## 2023-04-25 NOTE — Telephone Encounter (Signed)
 Looks like her insurance does not cover Zyrtec or Allegra either likely because all of them can be purchased OTC. Unfortunately, she would need to purchase the Loratadine or one of the others mentioned from OTC.

## 2023-04-26 NOTE — Telephone Encounter (Signed)
 Patient has been informed.

## 2023-05-09 ENCOUNTER — Encounter (HOSPITAL_COMMUNITY): Payer: Self-pay | Admitting: Psychiatry

## 2023-05-09 ENCOUNTER — Telehealth (HOSPITAL_COMMUNITY): Payer: Self-pay | Admitting: Psychiatry

## 2023-05-09 DIAGNOSIS — F411 Generalized anxiety disorder: Secondary | ICD-10-CM

## 2023-05-09 DIAGNOSIS — F331 Major depressive disorder, recurrent, moderate: Secondary | ICD-10-CM | POA: Diagnosis not present

## 2023-05-09 MED ORDER — GABAPENTIN 100 MG PO CAPS: 100.0000 mg | ORAL_CAPSULE | Freq: Four times a day (QID) | ORAL | 2 refills | Status: DC

## 2023-05-09 MED ORDER — BUPROPION HCL ER (XL) 150 MG PO TB24
150.0000 mg | ORAL_TABLET | Freq: Every day | ORAL | 2 refills | Status: DC
Start: 2023-05-09 — End: 2023-08-27

## 2023-05-09 MED ORDER — RISPERIDONE 0.5 MG PO TABS
0.5000 mg | ORAL_TABLET | Freq: Two times a day (BID) | ORAL | 2 refills | Status: DC
Start: 2023-05-09 — End: 2023-08-27

## 2023-05-09 NOTE — Progress Notes (Signed)
 [facility] MD Virtual Progress Note   Patient Location: Home Provider Location: Office  I connect with patient by video and verified that I am speaking with correct person by using two identifiers. I discussed the limitations of evaluation and management by telemedicine and the availability of in person appointments. I also discussed with the patient that there may be a patient responsible charge related to this service. The patient expressed understanding and agreed to proceed.  Kara Hall [id number] 53 y.o.  05/09/2023 10:43 AM  History of Present Illness:  Patient is evaluated by video session.  Today she took a day off because she has a lot of back pain.  She reported sleep is fair and sometimes he does not remember taking the gabapentin  during the day.  She reported a lot of back pain and neuropathy especially at night as she cannot sleep all night.  She is concerned about her 65 year old mother who recently had a fall again.  She is taking care of her mother.  She reported her job is very stressful and very busy but denies any recent crying spells, feeling of hopelessness or worthlessness.  Since taking the medication regularly she reported symptoms are much better despite stress level remain the same.  She denies any hallucination, paranoia or any suicidal thoughts.  She took Xanax  when she has a dental work.  She has a leftover Xanax .  She has no tremors shakes or any EPS.  She is not interested in therapy due to busy at work.  She denies any major panic attack.  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  Past Psychiatric History: Tried lexapro from psychiatrist made hyper.  Allergic to Trazodone and nefazodone.  Headache from Abilify . H/O impulsive buying and excessive spending. No history of suicidal attempt, psychosis and inpatient treatment.  We tried gabapentin , hydroxyzine , Buspar  but discontinued due to not consistent and poor compliance. Prescribed  Naltrexone  to help ETOH craving but could not afford.  Used to take Xanax  and hydroxyzine  but it was discontinued due to polypharmacy    Outpatient Encounter Medications as of 05/09/2023  Medication Sig   amLODipine  (NORVASC ) 10 MG tablet Take 1 tablet (10 mg total) by mouth daily.   buPROPion  (WELLBUTRIN  XL) 150 MG 24 hr tablet Take 1 tablet (150 mg total) by mouth daily.   butalbital -acetaminophen -caffeine  (FIORICET) 50-325-40 MG tablet TAKE 1 TO 2 TABLETS BY MOUTH DAILY AS NEEDED   diclofenac  (VOLTAREN ) 75 MG EC tablet Take 1 tablet by mouth twice daily   fluticasone  (FLONASE ) 50 MCG/ACT nasal spray Place 2 sprays into both nostrils daily.   gabapentin  (NEURONTIN ) 100 MG capsule Take 1 capsule (100 mg total) by mouth 4 (four) times daily.   levothyroxine  (SYNTHROID , LEVOTHROID) 125 MCG tablet Take 125 mcg by mouth daily before breakfast.    loratadine  (CLARITIN ) 10 MG tablet TAKE 1 TABLET BY MOUTH AT BEDTIME   omeprazole  (PRILOSEC) 20 MG capsule TAKE 1 CAPSULE(20 MG) BY MOUTH DAILY   pravastatin  (PRAVACHOL ) 20 MG tablet TAKE 1 TABLET BY MOUTH 5 DAYS A WEEK   propranolol  (INDERAL ) 10 MG tablet Take 1 tablet (10 mg total) by mouth 2 (two) times daily.   risperiDONE  (RISPERDAL ) 0.5 MG tablet Take 1 tablet (0.5 mg total) by mouth 2 (two) times daily.   valsartan  (DIOVAN ) 40 MG tablet Take 1 tablet by mouth once daily   Facility-Administered Encounter Medications as of 05/09/2023  Medication   triamcinolone  acetonide (KENALOG ) 10 MG/ML injection 10  mg    Recent Results (from the past 2160 hours)  CBC     Status: None   Collection Time: 04/12/23  3:45 PM  Result Value Ref Range   WBC 6.5 3.4 - 10.8 x10E3/uL   RBC 4.33 3.77 - 5.28 x10E6/uL   Hemoglobin 13.9 11.1 - 15.9 g/dL   Hematocrit 19.1 47.8 - 46.6 %   MCV 93 79 - 97 fL   MCH 32.1 26.6 - 33.0 pg   MCHC 34.6 31.5 - 35.7 g/dL   RDW 29.5 62.1 - 30.8 %   Platelets 291 150 - 450 x10E3/uL  Comprehensive metabolic panel with GFR      Status: Abnormal   Collection Time: 04/12/23  3:45 PM  Result Value Ref Range   Glucose 101 (H) 70 - 99 mg/dL   BUN 12 6 - 24 mg/dL   Creatinine, Ser 6.57 0.57 - 1.00 mg/dL   eGFR 846 >96 EX/BMW/4.13   BUN/Creatinine Ratio 20 9 - 23   Sodium 139 134 - 144 mmol/L   Potassium 4.1 3.5 - 5.2 mmol/L   Chloride 101 96 - 106 mmol/L   CO2 22 20 - 29 mmol/L   Calcium  8.9 8.7 - 10.2 mg/dL   Total Protein 7.2 6.0 - 8.5 g/dL   Albumin 4.5 3.8 - 4.9 g/dL   Globulin, Total 2.7 1.5 - 4.5 g/dL   Bilirubin Total 0.5 0.0 - 1.2 mg/dL   Alkaline Phosphatase 83 44 - 121 IU/L   AST 24 0 - 40 IU/L   ALT 36 (H) 0 - 32 IU/L  Lipid panel     Status: Abnormal   Collection Time: 04/12/23  3:45 PM  Result Value Ref Range   Cholesterol, Total 272 (H) 100 - 199 mg/dL   Triglycerides 244 0 - 149 mg/dL   HDL 64 >01 mg/dL   VLDL Cholesterol Cal 17 5 - 40 mg/dL   LDL Chol Calc (NIH) 027 (H) 0 - 99 mg/dL   LDL CALC COMMENT: Comment     Comment: Consider evaluating for Familial Hypercholesterolemia(FH), if clinically indicated.    Chol/HDL Ratio 4.3 0.0 - 4.4 ratio    Comment:                                   T. Chol/HDL Ratio                                             Men  Women                               1/2 Avg.Risk  3.4    3.3                                   Avg.Risk  5.0    4.4                                2X Avg.Risk  9.6    7.1  3X Avg.Risk 23.4   11.0      Psychiatric Specialty Exam: Physical Exam  Review of Systems  Weight 205 lb (93 kg), last menstrual period 01/08/2019.There is no height or weight on file to calculate BMI.  General Appearance: Casual  Eye Contact:  Good  Speech:  Normal Rate  Volume:  Normal  Mood:  Anxious  Affect:  Congruent  Thought Process:  Goal Directed  Orientation:  Full (Time, Place, and Person)  Thought Content:  Rumination  Suicidal Thoughts:  No  Homicidal Thoughts:  No  Memory:  Immediate;   Good Recent;    Good Remote;   Good  Judgement:  Intact  Insight:  Present  Psychomotor Activity:  Decreased  Concentration:  Concentration: Good and Attention Span: Good  Recall:  Good  Fund of Knowledge:  Good  Language:  Good  Akathisia:  No  Handed:  Right  AIMS (if indicated):     Assets:  Communication Skills Desire for Improvement Housing Talents/Skills Transportation  ADL's:  Intact  Cognition:  WNL  Sleep:  fair       04/12/2023    3:44 PM 02/16/2022   12:04 PM 10/16/2021    1:42 PM 04/05/2021    9:46 AM 02/16/2021   11:41 AM  Depression screen PHQ 2/9  Decreased Interest 0 2 0  3  Down, Depressed, Hopeless 0 2 0 1 3  PHQ - 2 Score 0 4 0 1 6  Altered sleeping 0 2   3  Tired, decreased energy 0 2   3  Change in appetite 0 1   3  Feeling bad or failure about yourself  0 0   3  Trouble concentrating 0 0   2  Moving slowly or fidgety/restless 0 0   1  Suicidal thoughts 0 0   0  PHQ-9 Score 0 9   21  Difficult doing work/chores Not difficult at all        Assessment/Plan: MDD (major depressive disorder), recurrent episode, moderate (HCC) - Plan: buPROPion  (WELLBUTRIN  XL) 150 MG 24 hr tablet, risperiDONE  (RISPERDAL ) 0.5 MG tablet, gabapentin  (NEURONTIN ) 100 MG capsule  GAD (generalized anxiety disorder) - Plan: gabapentin  (NEURONTIN ) 100 MG capsule  Discussed chronic stress related to her mother and job.  Patient feels since taking the medication more frequently it is helping her anxiety.  I encourage to take the gabapentin  200 mg at bedtime since sometimes she forgets to take 100 mg gabapentin  during the day at work.  Continue Wellbutrin  XL 150 mg daily and risperidone  0.5 mg at bedtime.  She is no longer drinking.  I encourage if she has dental work should take gabapentin  that can help her pain and anxiety rather than Xanax .  Patient acknowledged and agreed.  Recommend to call us  back if she is any question or any concern.  Follow-up in 3 months.   Follow Up Instructions:     I  discussed the assessment and treatment plan with the patient. The patient was provided an opportunity to ask questions and all were answered. The patient agreed with the plan and demonstrated an understanding of the instructions.   The patient was advised to call back or seek an in-person evaluation if the symptoms worsen or if the condition fails to improve as anticipated.    Collaboration of Care: Other provider involved in patient's care AEB notes are available in epic to review  Patient/Guardian was advised Release of Information must be obtained prior to any record  release in order to collaborate their care with an outside provider. Patient/Guardian was advised if they have not already done so to contact the registration department to sign all necessary forms in order for us  to release information regarding their care.   Consent: Patient/Guardian gives verbal consent for treatment and assignment of benefits for services provided during this visit. Patient/Guardian expressed understanding and agreed to proceed.     Total encounter time 21 minutes which includes face-to-face time, chart reviewed, care coordination, order entry and documentation during this encounter.   Note: This document was prepared by [employer] voice dictation technology and any errors that results from this process are unintentional.    Arturo Late, MD 05/09/2023

## 2023-07-20 ENCOUNTER — Other Ambulatory Visit: Payer: Self-pay | Admitting: Internal Medicine

## 2023-07-20 DIAGNOSIS — R0982 Postnasal drip: Secondary | ICD-10-CM

## 2023-08-05 ENCOUNTER — Ambulatory Visit: Admitting: Physical Medicine and Rehabilitation

## 2023-08-14 ENCOUNTER — Telehealth: Payer: Self-pay | Admitting: Internal Medicine

## 2023-08-14 ENCOUNTER — Ambulatory Visit: Admitting: Physical Medicine and Rehabilitation

## 2023-08-14 NOTE — Telephone Encounter (Signed)
 Confirmed appt for 7*/31

## 2023-08-15 ENCOUNTER — Encounter: Payer: Self-pay | Admitting: Internal Medicine

## 2023-08-15 ENCOUNTER — Ambulatory Visit: Attending: Internal Medicine | Admitting: Internal Medicine

## 2023-08-15 VITALS — BP 115/75 | HR 66 | Temp 97.7°F | Ht 66.0 in | Wt 203.0 lb

## 2023-08-15 DIAGNOSIS — F411 Generalized anxiety disorder: Secondary | ICD-10-CM

## 2023-08-15 DIAGNOSIS — Z7985 Long-term (current) use of injectable non-insulin antidiabetic drugs: Secondary | ICD-10-CM

## 2023-08-15 DIAGNOSIS — E782 Mixed hyperlipidemia: Secondary | ICD-10-CM

## 2023-08-15 DIAGNOSIS — I10 Essential (primary) hypertension: Secondary | ICD-10-CM

## 2023-08-15 DIAGNOSIS — R0982 Postnasal drip: Secondary | ICD-10-CM

## 2023-08-15 DIAGNOSIS — E66811 Obesity, class 1: Secondary | ICD-10-CM

## 2023-08-15 DIAGNOSIS — E6609 Other obesity due to excess calories: Secondary | ICD-10-CM

## 2023-08-15 DIAGNOSIS — Z6833 Body mass index (BMI) 33.0-33.9, adult: Secondary | ICD-10-CM

## 2023-08-15 MED ORDER — VALSARTAN 40 MG PO TABS: ORAL_TABLET | ORAL | 2 refills | Status: AC

## 2023-08-15 MED ORDER — LORATADINE 10 MG PO TABS: 10.0000 mg | ORAL_TABLET | Freq: Every day | ORAL | 0 refills | Status: DC

## 2023-08-15 MED ORDER — PRAVASTATIN SODIUM 20 MG PO TABS
ORAL_TABLET | ORAL | 1 refills | Status: DC
Start: 2023-08-15 — End: 2023-09-30

## 2023-08-15 MED ORDER — AMLODIPINE BESYLATE 10 MG PO TABS: 10.0000 mg | ORAL_TABLET | Freq: Every day | ORAL | 2 refills | Status: AC

## 2023-08-15 MED ORDER — WEGOVY 0.25 MG/0.5ML ~~LOC~~ SOAJ: 0.2500 mg | SUBCUTANEOUS | 0 refills | Status: DC

## 2023-08-15 NOTE — Patient Instructions (Signed)
 VISIT SUMMARY:  Today, you had a follow-up visit to manage your hypertension, hyperlipidemia, and discuss weight loss options. Your blood pressure is well-controlled, and you are making good progress with your cholesterol levels and dietary changes. We also discussed potential medication to assist with weight loss.  YOUR PLAN:  -HYPERTENSION: Hypertension means high blood pressure. Your blood pressure is well-controlled at 115/75 mmHg with your current medications. Continue taking amlodipine  10 mg daily and valsartan  40 mg daily. Please check your blood pressure twice a month at home.  -HYPERLIPIDEMIA: Hyperlipidemia means high levels of fats (like cholesterol) in your blood. Your LDL cholesterol was previously high at 191 mg/dL, and our goal is to get it below 100 mg/dL. Continue taking pravastatin  20 mg five days a week and keep up with your dietary changes, especially increasing fresh fruits and vegetables and reducing canned options.  -OBESITY: Obesity means having excess body weight. We discussed using Ozempic to help with weight loss. We will submit a request to your insurance for approval. If approved, the pharmacist will show you how to use it. Watch for side effects like nausea, vomiting, abdominal pain, severe diarrhea, or bowel obstruction. If you tolerate it well, we will increase the dose from 0.25 mg to 0.5 mg after one month.  INSTRUCTIONS:  Please follow up with us  in four months to monitor your progress. Continue to see Dr. Afine for your depression and anxiety management. Make sure to check your blood pressure twice a month and keep up with your dietary and exercise routines.

## 2023-08-15 NOTE — Progress Notes (Signed)
 Patient ID: Kara Hall, female    DOB: [birth year]  MRN: [id number]  CC: Hypertension (HTN f/u. Med refills. /Discuss weight loss options/Increased anxiety in the p.m. - requesting med for relaxation/)   Subjective: Kara Hall is a 53 y.o. female who presents for chronic ds management. Her concerns today include:  Patient with history of HTN, thyroid  cancer (dx age 64, s/p thyroidectomy), anxiety/dep, GERD, lumbar radiculopathy, HL.   Discussed the use of AI scribe software for clinical note transcription with the patient, who gave verbal consent to proceed.  History of Present Illness Kara Hall is a 53 year old female with hypertension and hyperlipidemia who presents for a four-month follow-up visit.  HTN: She manages her hypertension with amlodipine  10 mg daily and valsartan  40 mg daily. She does not regularly monitor her blood pressure at home despite having a monitor. She is making efforts to limit her salt intake.  HL/Obesity: Her hyperlipidemia was last evaluated in March, showing an LDL cholesterol level of 191 mg/dL. We increased her pravastatin  from 10 mg to 20 mg five days a week. She is trying to avoid fried and greasy foods, has reduced fast food intake, and incorporated protein shakes into her diet. She consumes fresh vegetables and fruits regularly, though sometimes uses canned vegetables and fruit cups.  She is interested in medication to assist with weight loss and has recently lost two pounds. She walks over 10,000 steps daily at work, which involves physical activity such as moving heavy boxes. She does not engage in additional exercise outside of work.  Hypothyroidism: She follows with endocrinology for this and reports being seen recently.  MDD/GAD: She is currently seeing Dr. Arfeen for depression and anxiety and takes gabapentin  four times a day. She experiences side effects such as dizziness, feeling 'cloudy headed,' and memory issues.   Reports he had placed her on this to help with anxiety.  She is wanting to know if I can prescribe something for her for the anxiety and to help her relax in the evenings.  She is also on Risperdal  and Wellbutrin .  Request refill on Claritin  which she takes for postnasal drip.    Patient Active Problem List   Diagnosis Date Noted   Moderate major depression (HCC) 06/22/2022   Mixed hyperlipidemia 08/19/2020   Obesity (BMI 30.0-34.9) 08/19/2020   Stressful life event affecting family 08/19/2020   Plantar fasciitis, bilateral 08/19/2020   Status post hysterectomy 12/18/2018   Prolonged Q-T interval on ECG 11/24/2018   Essential hypertension 11/24/2018   Dyspareunia in female 03/24/2018   Increased body mass index 03/24/2018   Carpal tunnel syndrome on both sides 12/10/2017   Achilles tendon contracture, left 10/04/2016   Displaced fracture of fifth metatarsal bone of left foot with delayed healing 05/07/2016   Bilateral wrist pain 11/28/2015   History of thyroid  cancer 08/09/2013   Anxiety 01/12/2013     Current Outpatient Medications on File Prior to Visit  Medication Sig Dispense Refill   amLODipine  (NORVASC ) 10 MG tablet Take 1 tablet (10 mg total) by mouth daily. 90 tablet 2   buPROPion  (WELLBUTRIN  XL) 150 MG 24 hr tablet Take 1 tablet (150 mg total) by mouth daily. 30 tablet 2   butalbital -acetaminophen -caffeine  (FIORICET) 50-325-40 MG tablet TAKE 1 TO 2 TABLETS BY MOUTH DAILY AS NEEDED 15 tablet 1   fluticasone  (FLONASE ) 50 MCG/ACT nasal spray Place 2 sprays into both nostrils daily. 16 g 1   gabapentin  (NEURONTIN ) 100 MG capsule Take  1 capsule (100 mg total) by mouth 4 (four) times daily. 120 capsule 2   levothyroxine  (SYNTHROID , LEVOTHROID) 125 MCG tablet Take 125 mcg by mouth daily before breakfast.      loratadine  (CLARITIN ) 10 MG tablet TAKE 1 TABLET BY MOUTH AT BEDTIME 90 tablet 0   pravastatin  (PRAVACHOL ) 20 MG tablet TAKE 1 TABLET BY MOUTH 5 DAYS A WEEK 60 tablet 1    propranolol  (INDERAL ) 10 MG tablet Take 1 tablet (10 mg total) by mouth 2 (two) times daily. 180 tablet 2   risperiDONE  (RISPERDAL ) 0.5 MG tablet Take 1 tablet (0.5 mg total) by mouth 2 (two) times daily. 60 tablet 2   valsartan  (DIOVAN ) 40 MG tablet Take 1 tablet by mouth once daily 90 tablet 2   diclofenac  (VOLTAREN ) 75 MG EC tablet Take 1 tablet by mouth twice daily (Patient not taking: Reported on 08/15/2023) 50 tablet 0   omeprazole  (PRILOSEC) 20 MG capsule TAKE 1 CAPSULE(20 MG) BY MOUTH DAILY (Patient not taking: Reported on 08/15/2023) 90 capsule 1   Current Facility-Administered Medications on File Prior to Visit  Medication Dose Route Frequency Provider Last Rate Last Admin   triamcinolone  acetonide (KENALOG ) 10 MG/ML injection 10 mg  10 mg Other Once Stover, Titorya, DPM        Allergies  Allergen Reactions   Other     BLOOD REFUSAL    Lipitor [Atorvastatin ]     Cramps and headaches   Sulfa Antibiotics Other (See Comments) and Nausea And Vomiting    Severe bladder, and kidney infection   Trazodone And Nefazodone Other (See Comments)    Headache every am     Social History   Socioeconomic History   Marital status: Married    Spouse name: Redell   Number of children: 3   Years of education: 12+   Highest education level: Not on file  Occupational History   Occupation: [employer]   Occupation: Cosmetologist    Employer: [employer]  Tobacco Use   Smoking status: Never   Smokeless tobacco: Never  Vaping Use   Vaping status: Never Used  Substance and Sexual Activity   Alcohol use: Yes    Comment: occ   Drug use: No   Sexual activity: Yes    Partners: Male    Birth control/protection: Surgical    Comment: Tubal ligation  Other Topics Concern   Not on file  Social History Narrative   Divorce from previous husband in 06/2016.   Now remarried 07/2016.   Lives at home with her new husband and cat.   She has three grown children who live with her husband. She has a  good relationship with her children.   Her father passed away May 01, 2016.   Social Drivers of Health   Financial Resource Strain: Medium Risk (10/26/2022)   Overall Financial Resource Strain (CARDIA)    Difficulty of Paying Living Expenses: Somewhat hard  Food Insecurity: No Food Insecurity (10/26/2022)   Hunger Vital Sign    Worried About Running Out of Food in the Last Year: Never true    Ran Out of Food in the Last Year: Never true  Transportation Needs: No Transportation Needs (10/26/2022)   PRAPARE - Administrator, Civil Service (Medical): No    Lack of Transportation (Non-Medical): No  Physical Activity: Sufficiently Active (10/26/2022)   Exercise Vital Sign    Days of Exercise per Week: 5 days    Minutes of Exercise per Session: 30 min  Stress: No Stress Concern Present (10/26/2022)   [employer] of Occupational Health - Occupational Stress Questionnaire    Feeling of Stress : Not at all  Social Connections: Moderately Isolated (10/26/2022)   Social Connection and Isolation Panel    Frequency of Communication with Friends and Family: More than three times a week    Frequency of Social Gatherings with Friends and Family: More than three times a week    Attends Religious Services: Never    Database administrator or Organizations: No    Attends Banker Meetings: Never    Marital Status: Married  Catering manager Violence: Not At Risk (10/26/2022)   Humiliation, Afraid, Rape, and Kick questionnaire    Fear of Current or Ex-Partner: No    Emotionally Abused: No    Physically Abused: No    Sexually Abused: No    Family History  Problem Relation Age of Onset   Heart disease Mother    Hyperlipidemia Mother    Heart disease Father    Hyperlipidemia Father    Diabetes Father    Colon cancer Maternal Grandfather    Breast cancer Cousin 88 - 51   Ovarian cancer Cousin 20 - 29   Heart disease Brother    Hyperlipidemia Brother    Hypertension  Brother    Heart disease Brother    Hyperlipidemia Brother    Esophageal cancer Neg Hx    Stomach cancer Neg Hx    Rectal cancer Neg Hx     Past Surgical History:  Procedure Laterality Date   CYSTOSCOPY N/A 12/18/2018   Procedure: CYSTOSCOPY;  Surgeon: Taam-Akelman, Rosalea K, MD;  Location: [facility];  Service: Gynecology;  Laterality: N/A;   LAPAROSCOPIC LYSIS OF ADHESIONS N/A 12/18/2018   Procedure: LAPAROSCOPIC LYSIS OF ADHESIONS;  Surgeon: Bonnielee Rayleen POUR, MD;  Location: [facility];  Service: Gynecology;  Laterality: N/A;   ROBOTIC ASSISTED LAPAROSCOPIC HYSTERECTOMY AND SALPINGECTOMY Bilateral 12/18/2018   Procedure: XI ROBOTIC ASSISTED LAPAROSCOPIC HYSTERECTOMY AND BILATERAL SALPINGECTOMY;  Surgeon: Bonnielee Rayleen POUR, MD;  Location: [facility];  Service: Gynecology;  Laterality: Bilateral;   THROMBECTOMY Right 2001   leg; after 3rd child was born; for deep vein thrombosis   THYROIDECTOMY Right 08/28/2013   Procedure: RIGHT THYROID  LOBECTOMY POSSIBLE TOTAL THYROIDECTOMY;  Surgeon: Marlyce Finer, MD;  Location: [facility] OR;  Service: ENT;  Laterality: Right;   THYROIDECTOMY Left 09/10/2013   Procedure: COMPLETE LEFT THYROIDECTOMY;  Surgeon: Marlyce Finer, MD;  Location: [facility] OR;  Service: ENT;  Laterality: Left;   TUBAL LIGATION  2001    ROS: Review of Systems Negative except as stated above  PHYSICAL EXAM: BP 115/75 (BP Location: Left Arm, Patient Position: Sitting, Cuff Size: Normal)   Pulse 66   Temp 97.7 F (36.5 C) (Oral)   Ht 5' 6 (1.676 m)   Wt 203 lb (92.1 kg)   LMP 01/08/2019   SpO2 97%   BMI 32.77 kg/m   Wt Readings from Last 3 Encounters:  08/15/23 203 lb (92.1 kg)  04/12/23 205 lb (93 kg)  10/26/22 205 lb (93 kg)    Physical Exam  General appearance - alert, well appearing, middle-age Caucasian female and in no distress Mental status - normal mood, behavior, speech, dress, motor activity, and thought  processes Chest - clear to auscultation, no wheezes, rales or rhonchi, symmetric air entry Heart - normal rate, regular rhythm, normal S1, S2, no murmurs, rubs, clicks or gallops Extremities -  peripheral pulses normal, no pedal edema, no clubbing or cyanosis  The 10-year ASCVD risk score (Arnett DK, et al., 2019) is: 1.8%   Values used to calculate the score:     Age: 52 years     Clincally relevant sex: Female     Is Non-Hispanic African American: No     Diabetic: No     Tobacco smoker: No     Systolic Blood Pressure: 115 mmHg     Is BP treated: Yes     HDL Cholesterol: 64 mg/dL     Total Cholesterol: 272 mg/dL     2/68/7974   88:60 AM 04/12/2023    3:44 PM 02/16/2022   12:04 PM  Depression screen PHQ 2/9  Decreased Interest 0 0 2  Down, Depressed, Hopeless 0 0 2  PHQ - 2 Score 0 0 4  Altered sleeping 0 0 2  Tired, decreased energy 1 0 2  Change in appetite 0 0 1  Feeling bad or failure about yourself  0 0 0  Trouble concentrating 0 0 0  Moving slowly or fidgety/restless 0 0 0  Suicidal thoughts 0 0 0  PHQ-9 Score 1 0 9  Difficult doing work/chores Not difficult at all Not difficult at all       08/15/2023   11:39 AM 04/12/2023    3:45 PM 02/16/2022   12:04 PM 02/16/2021   11:41 AM  GAD 7 : Generalized Anxiety Score  Nervous, Anxious, on Edge 1 0 2 3  Control/stop worrying 1 0 1 3  Worry too much - different things 0 0 2 3  Trouble relaxing 0 0 1 2  Restless 0 0 1 1  Easily annoyed or irritable 0 0 1 1  Afraid - awful might happen 0 0 1 1  Total GAD 7 Score 2 0 9 14  Anxiety Difficulty Not difficult at all Not difficult at all          Latest Ref Rng & Units 04/12/2023    3:45 PM 02/16/2022   12:15 PM 08/31/2020    3:02 PM  CMP  Glucose 70 - 99 mg/dL 898  899  91   BUN 6 - 24 mg/dL 12  16  14    Creatinine 0.57 - 1.00 mg/dL 9.40  9.33  9.45   Sodium 134 - 144 mmol/L 139  140  139   Potassium 3.5 - 5.2 mmol/L 4.1  4.5  3.9   Chloride 96 - 106 mmol/L 101  102  103    CO2 20 - 29 mmol/L 22  23  20    Calcium  8.7 - 10.2 mg/dL 8.9  8.7  8.2   Total Protein 6.0 - 8.5 g/dL 7.2  6.9    Total Bilirubin 0.0 - 1.2 mg/dL 0.5  0.4    Alkaline Phos 44 - 121 IU/L 83  70    AST 0 - 40 IU/L 24  13    ALT 0 - 32 IU/L 36  14     Lipid Panel     Component Value Date/Time   CHOL 272 (H) 04/12/2023 1545   TRIG 101 04/12/2023 1545   HDL 64 04/12/2023 1545   CHOLHDL 4.3 04/12/2023 1545   CHOLHDL 2.7 11/23/2014 1554   VLDL 11 11/23/2014 1554   LDLCALC 191 (H) 04/12/2023 1545    CBC    Component Value Date/Time   WBC 6.5 04/12/2023 1545   WBC 13.9 (H) 12/19/2018 0812   RBC 4.33 04/12/2023  1545   RBC 3.55 (L) 12/19/2018 0812   HGB 13.9 04/12/2023 1545   HCT 40.2 04/12/2023 1545   PLT 291 04/12/2023 1545   MCV 93 04/12/2023 1545   MCH 32.1 04/12/2023 1545   MCH 28.2 12/19/2018 0812   MCHC 34.6 04/12/2023 1545   MCHC 30.6 12/19/2018 0812   RDW 12.4 04/12/2023 1545   LYMPHSABS 1.5 05/19/2020 0926   MONOABS 0.5 11/23/2014 1554   EOSABS 0.2 05/19/2020 0926   BASOSABS 0.1 05/19/2020 0926    ASSESSMENT AND PLAN: 1. Essential hypertension (Primary) At goal.  Continue amlodipine  and Diovan  - amLODipine  (NORVASC ) 10 MG tablet; Take 1 tablet (10 mg total) by mouth daily.  Dispense: 90 tablet; Refill: 2 - valsartan  (DIOVAN ) 40 MG tablet; Take 1 tablet by mouth once daily  Dispense: 90 tablet; Refill: 2  2. Mixed hyperlipidemia LDL was not at goal on last visit.  We subsequently increased pravastatin  to 20 mg which he is taking consistently - pravastatin  (PRAVACHOL ) 20 MG tablet; TAKE 1 TABLET BY MOUTH 5 DAYS A WEEK  Dispense: 60 tablet; Refill: 1  3. Class 1 obesity due to excess calories with serious comorbidity and body mass index (BMI) of 33.0 to 33.9 in adult Commended her on changing her eating habits.  Encourage daily fresh fruits and vegetables.  She takes a fruit cocktail in her lunch box every day.  I recommend fresh fruits instead of fruit cocktail.   She is wanting to be tried with Ozempic or Mounjaro.  Advised that we can prescribe Wegovy  but it may not be covered by her insurance.  We will have to get prior approval.  I went over with pt how the medication works and potential side effects including nausea, vomiting, diarrhea/constipation, bowel blockage, palpitations and pancreatitis.  Advised to stop the medicine and be seen if pt develops any abdominal pain, vomiting, severe diarrhea/constipation or palpitations. -She is willing to try the Wegovy .  We will start at the 0.25 mg once a week.  If it is covered by her insurance and she takes it for a month without significant side effects, advised that she calls to let me know so that we can subsequently increase the dose - Semaglutide -Weight Management (WEGOVY ) 0.25 MG/0.5ML SOAJ; Inject 0.25 mg into the skin once a week. Pharmacist to show administration  Dispense: 2 mL; Refill: 0  4. GAD (generalized anxiety disorder) Recommend decreasing the gabapentin  to twice a day instead of 4 times a day due to side effects that she is experiencing.  Recommend that she touch base with her psychiatrist to inform him of the side effects as well.  5. Postnasal drip - loratadine  (CLARITIN ) 10 MG tablet; Take 1 tablet (10 mg total) by mouth at bedtime.  Dispense: 90 tablet; Refill: 0   Patient was given the opportunity to ask questions.  Patient verbalized understanding of the plan and was able to repeat key elements of the plan.   This documentation was completed using Paediatric nurse.  Any transcriptional errors are unintentional.  No orders of the defined types were placed in this encounter.    Requested Prescriptions   Pending Prescriptions Disp Refills   amLODipine  (NORVASC ) 10 MG tablet 90 tablet 2    Sig: Take 1 tablet (10 mg total) by mouth daily.   pravastatin  (PRAVACHOL ) 20 MG tablet 60 tablet 1    Sig: TAKE 1 TABLET BY MOUTH 5 DAYS A WEEK   valsartan  (DIOVAN ) 40 MG  tablet 90 tablet  2    Sig: Take 1 tablet by mouth once daily   loratadine  (CLARITIN ) 10 MG tablet 90 tablet 0    Sig: Take 1 tablet (10 mg total) by mouth at bedtime.   Semaglutide -Weight Management (WEGOVY ) 0.25 MG/0.5ML SOAJ 2 mL 0    Sig: Inject 0.25 mg into the skin once a week. Pharmacist to show administration    No follow-ups on file.  Barnie Louder, MD, FACP

## 2023-08-20 ENCOUNTER — Other Ambulatory Visit (HOSPITAL_COMMUNITY): Payer: Self-pay | Admitting: Psychiatry

## 2023-08-20 DIAGNOSIS — F331 Major depressive disorder, recurrent, moderate: Secondary | ICD-10-CM

## 2023-08-27 ENCOUNTER — Other Ambulatory Visit (HOSPITAL_COMMUNITY): Payer: Self-pay | Admitting: *Deleted

## 2023-08-27 DIAGNOSIS — F411 Generalized anxiety disorder: Secondary | ICD-10-CM

## 2023-08-27 DIAGNOSIS — F331 Major depressive disorder, recurrent, moderate: Secondary | ICD-10-CM

## 2023-08-27 MED ORDER — GABAPENTIN 100 MG PO CAPS: 100.0000 mg | ORAL_CAPSULE | Freq: Four times a day (QID) | ORAL | 0 refills | Status: DC

## 2023-08-27 MED ORDER — BUPROPION HCL ER (XL) 150 MG PO TB24
150.0000 mg | ORAL_TABLET | Freq: Every day | ORAL | 0 refills | Status: DC
Start: 2023-08-27 — End: 2023-08-30

## 2023-08-27 MED ORDER — RISPERIDONE 0.5 MG PO TABS: 0.5000 mg | ORAL_TABLET | Freq: Two times a day (BID) | ORAL | 0 refills | Status: DC

## 2023-08-30 ENCOUNTER — Encounter (HOSPITAL_COMMUNITY): Payer: Self-pay | Admitting: Psychiatry

## 2023-08-30 ENCOUNTER — Telehealth (HOSPITAL_BASED_OUTPATIENT_CLINIC_OR_DEPARTMENT_OTHER): Admitting: Psychiatry

## 2023-08-30 DIAGNOSIS — F411 Generalized anxiety disorder: Secondary | ICD-10-CM | POA: Diagnosis not present

## 2023-08-30 DIAGNOSIS — F331 Major depressive disorder, recurrent, moderate: Secondary | ICD-10-CM | POA: Diagnosis not present

## 2023-08-30 MED ORDER — RISPERIDONE 0.5 MG PO TABS: 0.5000 mg | ORAL_TABLET | Freq: Two times a day (BID) | ORAL | 2 refills | Status: DC

## 2023-08-30 MED ORDER — BUPROPION HCL ER (XL) 150 MG PO TB24: 150.0000 mg | ORAL_TABLET | Freq: Every day | ORAL | 2 refills | Status: DC

## 2023-08-30 MED ORDER — GABAPENTIN 100 MG PO CAPS: ORAL_CAPSULE | ORAL | 2 refills | Status: DC

## 2023-08-30 NOTE — Progress Notes (Signed)
  Health MD Virtual Progress Note   Patient Location: Home Provider Location: Home Office  I connect with patient by video and verified that I am speaking with correct person by using two identifiers. I discussed the limitations of evaluation and management by telemedicine and the availability of in person appointments. I also discussed with the patient that there may be a patient responsible charge related to this service. The patient expressed understanding and agreed to proceed.  Kara Hall [id number] 53 y.o.  08/30/2023 9:56 AM  History of Present Illness:  Patient was evaluated by video session.  She reported some anxiety, back pain and is stressed about her 49 year old mother who is not doing very well.  Today she has to take her mother to nail salon because she has an ingrowing nail and she is in a lot of pain.  She reported lately not sleeping well and have a lot of nervousness.  She also reported a lot of back pain and have difficulty lifting the boxes.  Her job is stressful which requires a lot of heavy lifting.  She denies any impulsive behavior, mania, psychosis, hallucination.  She denies any hopelessness or suicidal thoughts.  She denies any major panic attack.  She is trying to lose weight and she lost few pounds since the last visit but also requested weight loss medication from her primary care.  She was prescribed Wegovy  but her insurance did not cover and she is frustrated.  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  She is taking respite all, gabapentin  and Wellbutrin .  However when asked about how she is taking she told that she is taking respite all only in the morning and not taking the afternoon gabapentin .  She is not sure why she is missing the dose of risperidone .  She apologized but promised to take the medication as written.   She denies any tremors, shakes or any EPS.  Past Psychiatric History: History of anxiety, impulsive buying and excessive spending.  Lexapro made hyper, headaches from Abilify , allergies with trazodone and nefazodone.  History of not consistent with medication.  Tried BuSpar , hydroxyzine , gabapentin .  No history of suicidal attempt, psychosis and inpatient treatment. Prescribed Naltrexone  to help ETOH craving but could not afford.  Used to take Xanax  and hydroxyzine  but it was discontinued due to polypharmacy   Past Medical History:  Diagnosis Date   Anemia    Anxiety    panic attack, also reports problems with depression, relative to her job & upcoming surgery , relative to life problems - with children & spouse     Back complaints 2013   has had steroid injection    Depression    DVT (deep venous thrombosis) (HCC) 2001   RLE   Fibroids    Foot fracture, left    HTN (hypertension)    Migraine    h/o migraines - as a teenager  & again now with family issues, states she has to lay down & rest in a dark room  (08/28/2013)   Muscle strain of hand    wearing home made splint today, L hand   Peripheral vascular disease (HCC)    DVT- per pt. in 2001- had vein removed.    Refusal of blood transfusions as patient is Jehovah's Witness    Thyroid  cancer (HCC)    Thyroid  nodule  Outpatient Encounter Medications as of 08/30/2023  Medication Sig   amLODipine  (NORVASC ) 10 MG tablet Take 1 tablet (10 mg total) by mouth daily.   buPROPion  (WELLBUTRIN  XL) 150 MG 24 hr tablet Take 1 tablet (150 mg total) by mouth daily.   butalbital -acetaminophen -caffeine  (FIORICET) 50-325-40 MG tablet TAKE 1 TO 2 TABLETS BY MOUTH DAILY AS NEEDED   diclofenac  (VOLTAREN ) 75 MG EC tablet Take 1 tablet by mouth twice daily (Patient not taking: Reported on 08/15/2023)   fluticasone  (FLONASE ) 50 MCG/ACT nasal spray Place 2 sprays into both nostrils daily.   gabapentin  (NEURONTIN ) 100 MG capsule Take 1 capsule (100 mg  total) by mouth 4 (four) times daily.   levothyroxine  (SYNTHROID , LEVOTHROID) 125 MCG tablet Take 125 mcg by mouth daily before breakfast.    loratadine  (CLARITIN ) 10 MG tablet Take 1 tablet (10 mg total) by mouth at bedtime.   omeprazole  (PRILOSEC) 20 MG capsule TAKE 1 CAPSULE(20 MG) BY MOUTH DAILY (Patient not taking: Reported on 08/15/2023)   pravastatin  (PRAVACHOL ) 20 MG tablet TAKE 1 TABLET BY MOUTH 5 DAYS A WEEK   propranolol  (INDERAL ) 10 MG tablet Take 1 tablet (10 mg total) by mouth 2 (two) times daily.   risperiDONE  (RISPERDAL ) 0.5 MG tablet Take 1 tablet (0.5 mg total) by mouth 2 (two) times daily.   Semaglutide -Weight Management (WEGOVY ) 0.25 MG/0.5ML SOAJ Inject 0.25 mg into the skin once a week. Pharmacist to show administration   valsartan  (DIOVAN ) 40 MG tablet Take 1 tablet by mouth once daily   Facility-Administered Encounter Medications as of 08/30/2023  Medication   triamcinolone  acetonide (KENALOG ) 10 MG/ML injection 10 mg    No results found for this or any previous visit (from the past 2160 hours).   Psychiatric Specialty Exam: Physical Exam  Review of Systems  Musculoskeletal:  Positive for back pain.  Psychiatric/Behavioral:  Positive for sleep disturbance. The patient is nervous/anxious.     Last menstrual period 01/08/2019.There is no height or weight on file to calculate BMI.  General Appearance: Casual  Eye Contact:  Fair  Speech:  Normal Rate  Volume:  Normal  Mood:  Anxious  Affect:  Appropriate  Thought Process:  Goal Directed  Orientation:  Full (Time, Place, and Person)  Thought Content:  Rumination  Suicidal Thoughts:  No  Homicidal Thoughts:  No  Memory:  Immediate;   Good Recent;   Good Remote;   Good  Judgement:  Intact  Insight:  Present  Psychomotor Activity:  Decreased  Concentration:  Concentration: Fair and Attention Span: Fair  Recall:  Good  Fund of Knowledge:  Good  Language:  Good  Akathisia:  No  Handed:  Right  AIMS (if  indicated):     Assets:  Communication Skills Desire for Improvement Financial Resources/Insurance Intimacy Transportation  ADL's:  Intact  Cognition:  WNL  Sleep:  fair       08/15/2023   11:39 AM 04/12/2023    3:44 PM 02/16/2022   12:04 PM 10/16/2021    1:42 PM 04/05/2021    9:46 AM  Depression screen PHQ 2/9  Decreased Interest 0 0 2 0   Down, Depressed, Hopeless 0 0 2 0 1  PHQ - 2 Score 0 0 4 0 1  Altered sleeping 0 0 2    Tired, decreased energy 1 0 2    Change in appetite 0 0 1    Feeling bad or failure about yourself  0 0 0    Trouble concentrating 0 0  0    Moving slowly or fidgety/restless 0 0 0    Suicidal thoughts 0 0 0    PHQ-9 Score 1 0 9    Difficult doing work/chores Not difficult at all Not difficult at all       Assessment/Plan: MDD (major depressive disorder), recurrent episode, moderate (HCC) - Plan: gabapentin  (NEURONTIN ) 100 MG capsule, buPROPion  (WELLBUTRIN  XL) 150 MG 24 hr tablet, risperiDONE  (RISPERDAL ) 0.5 MG tablet  GAD (generalized anxiety disorder) - Plan: gabapentin  (NEURONTIN ) 100 MG capsule  Reviewed notes from other provider.  Patient is a 53 year old female with history of hypertension, chronic back pain major depressive disorder, generalized anxiety disorder and currently taking multiple psychotropic medication.  Discussed with the patient about to take the medicine as prescribed as patient not taking the risperidone  twice a day and taking gabapentin  only 3 pills a day.  Patient promised that she will take the medicine as prescribed and realize that could be the reason she is not sleeping well and having more anxiety.  Decided not to add or change the medication for now.  Recommend to continue risperidone  0.5 mg twice a day, gabapentin  100 mg in the morning when she wakes up and take 3 capsules at bedtime to help her sleep and anxiety and she will continue Wellbutrin  XL 150 mg in the morning.  I encouraged her to contact her primary care to discuss  alternative for weight loss since her insurance does not cover Wegovy .  Recommended to call back if you have any question or any concern.  She is not interested in therapy.  Follow-up in 3 months   Follow Up Instructions:     I discussed the assessment and treatment plan with the patient. The patient was provided an opportunity to ask questions and all were answered. The patient agreed with the plan and demonstrated an understanding of the instructions.   The patient was advised to call back or seek an in-person evaluation if the symptoms worsen or if the condition fails to improve as anticipated.    Collaboration of Care: Other provider involved in patient's care AEB notes are available in epic to review  Patient/Guardian was advised Release of Information must be obtained prior to any record release in order to collaborate their care with an outside provider. Patient/Guardian was advised if they have not already done so to contact the registration department to sign all necessary forms in order for us  to release information regarding their care.   Consent: Patient/Guardian gives verbal consent for treatment and assignment of benefits for services provided during this visit. Patient/Guardian expressed understanding and agreed to proceed.     Total encounter time 24 minutes which includes face-to-face time, chart reviewed, care coordination, order entry and documentation during this encounter.   Note: This document was prepared by [employer] voice dictation technology and any errors that results from this process are unintentional.    Leni ONEIDA Client, MD 08/30/2023

## 2023-09-28 ENCOUNTER — Other Ambulatory Visit: Payer: Self-pay | Admitting: Internal Medicine

## 2023-09-28 DIAGNOSIS — E782 Mixed hyperlipidemia: Secondary | ICD-10-CM

## 2023-11-18 ENCOUNTER — Encounter: Payer: Self-pay | Admitting: Radiology

## 2023-11-21 ENCOUNTER — Other Ambulatory Visit (HOSPITAL_COMMUNITY): Payer: Self-pay | Admitting: Psychiatry

## 2023-11-21 DIAGNOSIS — F331 Major depressive disorder, recurrent, moderate: Secondary | ICD-10-CM

## 2023-11-25 IMAGING — DX DG KNEE COMPLETE 4+V*L*
4 series · 4 of 4 positions shown · non-contrast
Comparison: None.

CLINICAL DATA: Left knee pain and swelling

EXAM:
LEFT KNEE - COMPLETE 4+ VIEW

[knee ap]
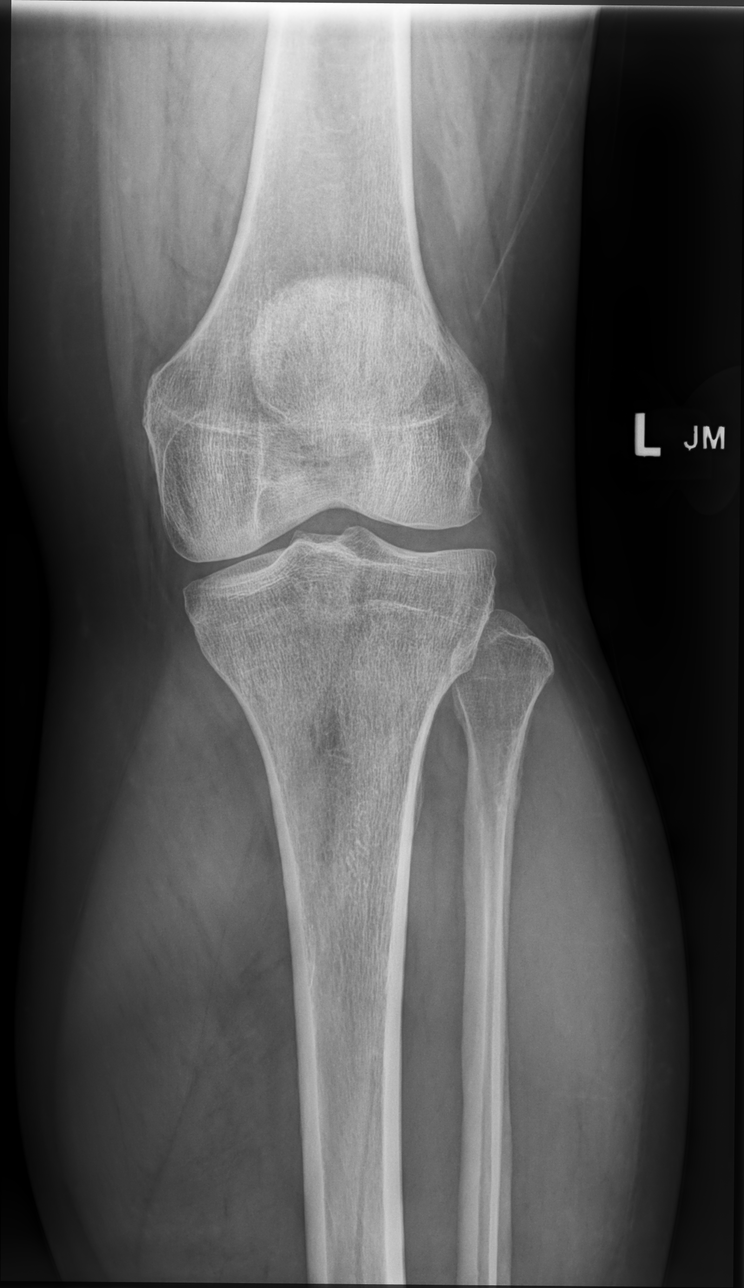

[knee lmo]
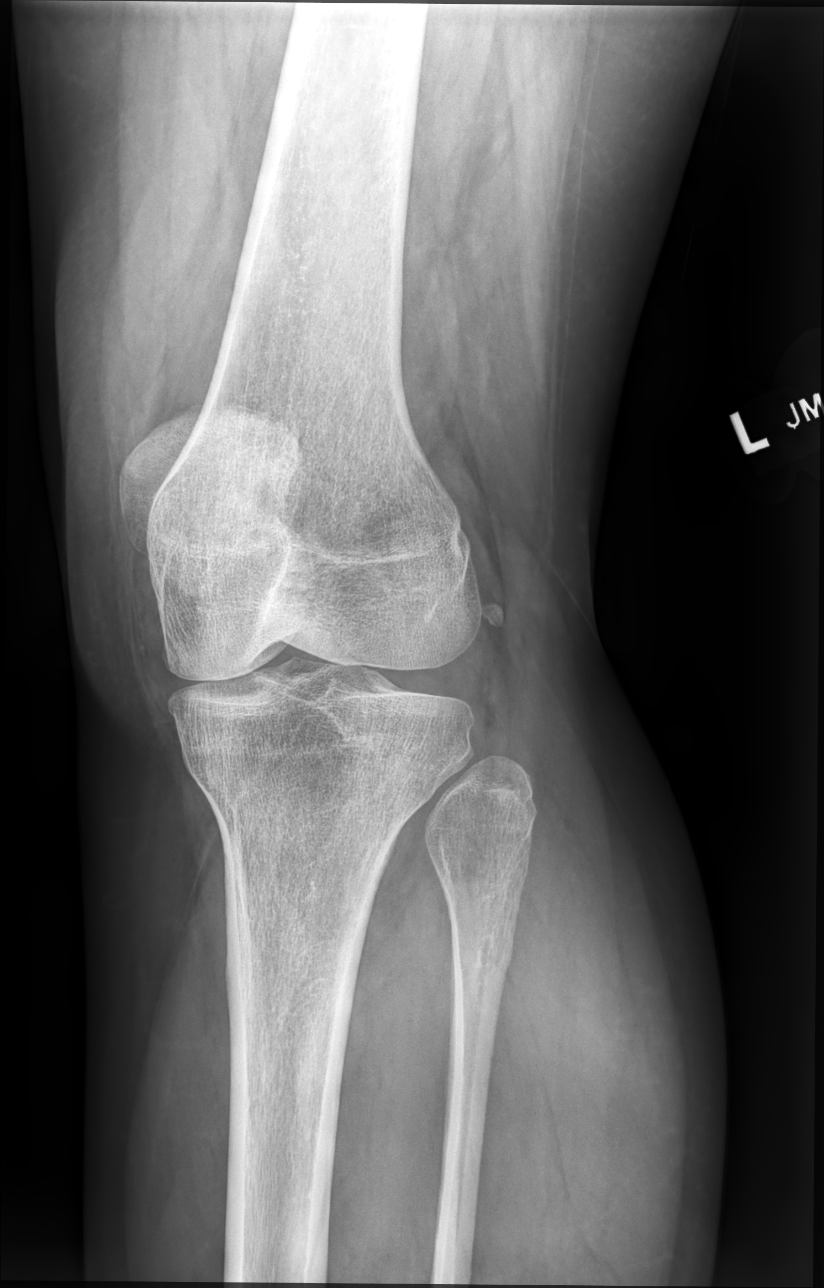

[knee lat]
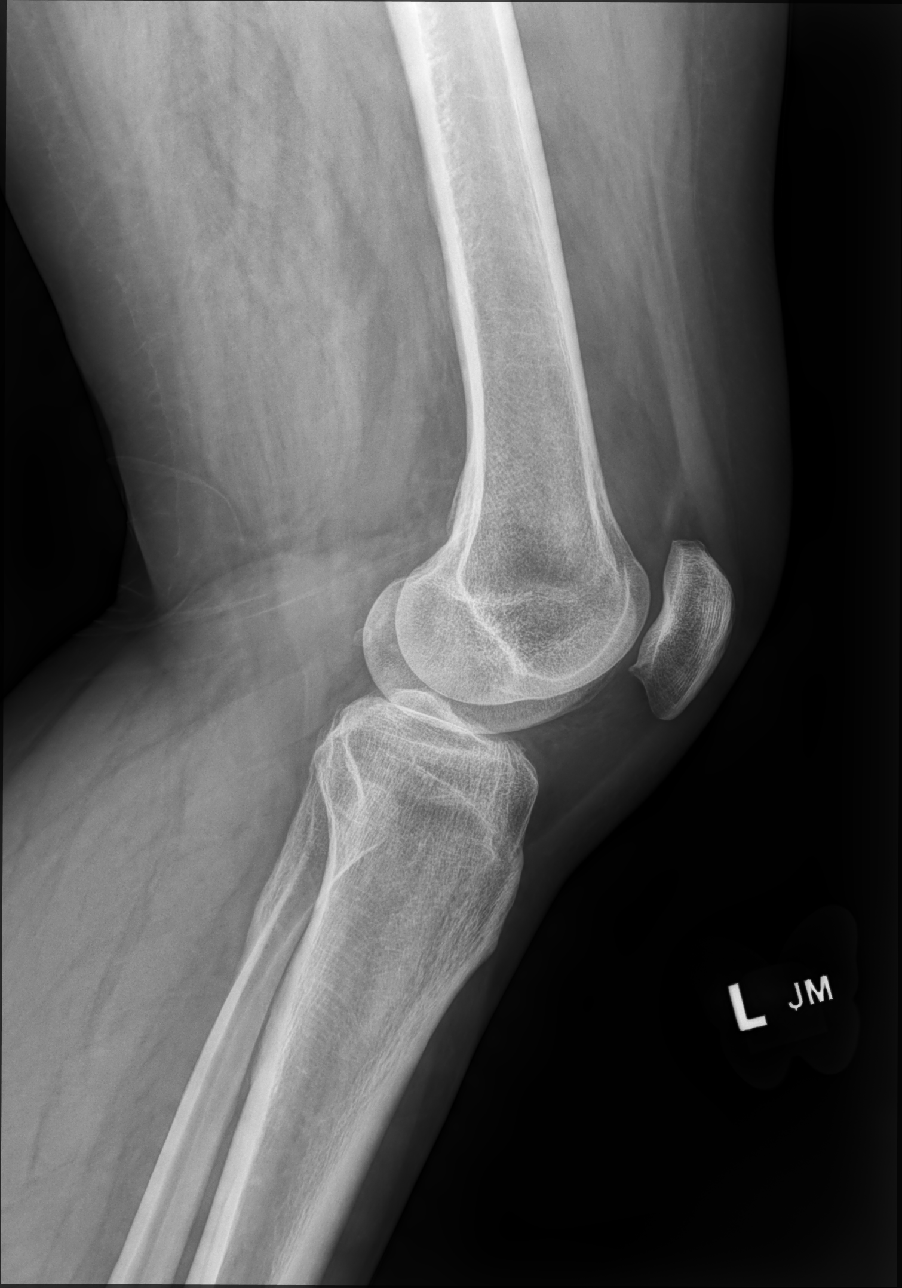

[patella [id]]
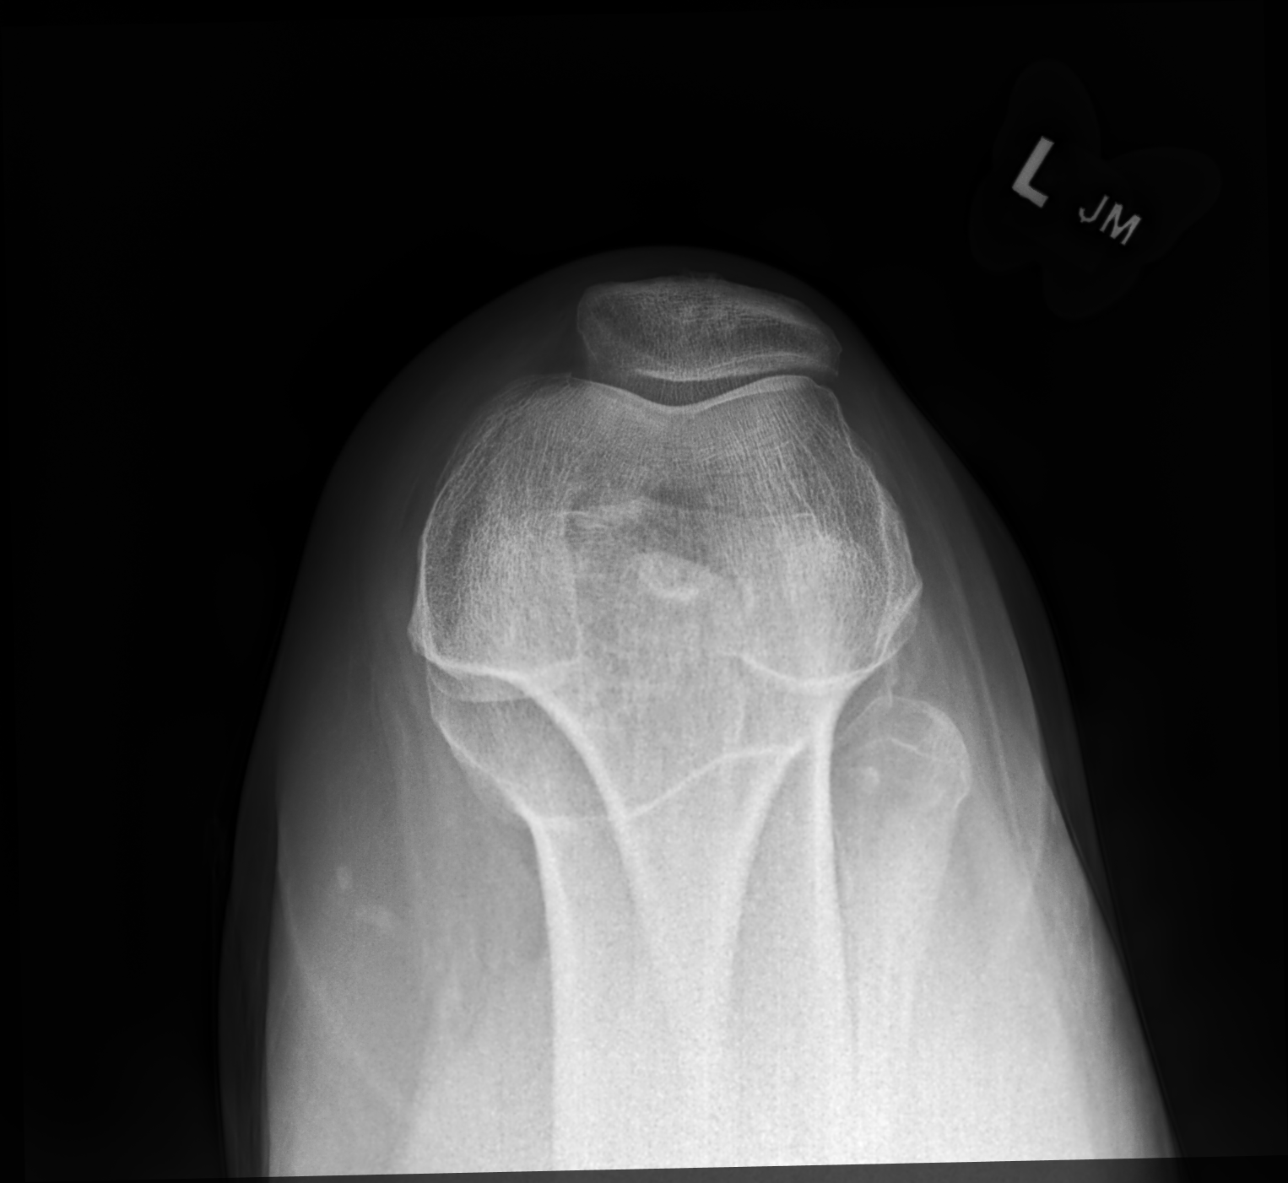

[4 of 4 positions shown; findings below may reference images not displayed]

FINDINGS: No evidence of fracture, dislocation, or joint effusion. No evidence
of arthropathy or other focal bone abnormality. Soft tissues are
unremarkable.
IMPRESSION: Negative.

## 2023-11-26 ENCOUNTER — Other Ambulatory Visit (HOSPITAL_COMMUNITY): Payer: Self-pay | Admitting: *Deleted

## 2023-11-26 DIAGNOSIS — F331 Major depressive disorder, recurrent, moderate: Secondary | ICD-10-CM

## 2023-11-26 MED ORDER — BUPROPION HCL ER (XL) 150 MG PO TB24: 150.0000 mg | ORAL_TABLET | Freq: Every day | ORAL | 0 refills | Status: DC

## 2023-11-29 ENCOUNTER — Encounter (HOSPITAL_COMMUNITY): Payer: Self-pay | Admitting: Psychiatry

## 2023-11-29 ENCOUNTER — Telehealth (HOSPITAL_BASED_OUTPATIENT_CLINIC_OR_DEPARTMENT_OTHER): Admitting: Psychiatry

## 2023-11-29 VITALS — Wt 200.0 lb

## 2023-11-29 DIAGNOSIS — F411 Generalized anxiety disorder: Secondary | ICD-10-CM | POA: Diagnosis not present

## 2023-11-29 DIAGNOSIS — F331 Major depressive disorder, recurrent, moderate: Secondary | ICD-10-CM | POA: Diagnosis not present

## 2023-11-29 DIAGNOSIS — F410 Panic disorder [episodic paroxysmal anxiety] without agoraphobia: Secondary | ICD-10-CM

## 2023-11-29 MED ORDER — RISPERIDONE 0.5 MG PO TABS
0.5000 mg | ORAL_TABLET | Freq: Every day | ORAL | 2 refills | Status: AC
Start: 2023-11-29 — End: 2024-02-27

## 2023-11-29 MED ORDER — BUPROPION HCL ER (XL) 150 MG PO TB24: 150.0000 mg | ORAL_TABLET | Freq: Every day | ORAL | 2 refills | Status: AC

## 2023-11-29 MED ORDER — GABAPENTIN 100 MG PO CAPS: ORAL_CAPSULE | ORAL | 2 refills | Status: AC

## 2023-11-29 NOTE — Progress Notes (Signed)
 [facility] MD Virtual Progress Note   Patient Location: Home Provider Location: Home office  I connect with patient by video and verified that I am speaking with correct person by using two identifiers. I discussed the limitations of evaluation and management by telemedicine and the availability of in person appointments. I also discussed with the patient that there may be a patient responsible charge related to this service. The patient expressed understanding and agreed to proceed.  Kara Hall [id number] 53 y.o.  11/29/2023 10:23 AM  History of Present Illness:  Patient is evaluated by video session.  She tried to take gabapentin  more at bedtime but next day she felt very groggy.  She is back on gabapentin  200 mg at bedtime and 100 mg in the morning and reported working very well and she does not feel as anxious.  She denies any major panic attacks since the last visit.  Her job is going well but very busy since holiday season is coming.  She has to go to work early.  She reported mother is doing well, mother has multiple health issues but so far stable.  Her daughter also helps when she need to her to take care of the mother.  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  She denies any crying spells, feeling of hopelessness or worthlessness.  She is taking risperidone  only in the morning and that seems to be working well.  She does not want to be sleepy or groggy with the second dose of risperidone .  She is also compliant with Wellbutrin .  She denies any tremors, shakes or any EPS.  She decided to watch her calorie intake and decided to have healthy diet.  She is not sure but she may have lost maybe 1 or 2 pounds since the last visit.  Patient denies any hallucination, paranoia, suicidal thoughts or homicidal thoughts.  So far tolerating her medication and like to keep the current dose.  Past Psychiatric History: History of anxiety, impulsive buying and excessive spending.   Lexapro made hyper, headaches from Abilify , allergies with trazodone and nefazodone.  History of not consistent with medication.  Tried BuSpar , hydroxyzine , gabapentin .  No history of suicidal attempt, psychosis and inpatient treatment. Prescribed Naltrexone  to help ETOH craving but could not afford.  Used to take Xanax  and hydroxyzine  but it was discontinued due to polypharmacy   Past Medical History:  Diagnosis Date   Anemia    Anxiety    panic attack, also reports problems with depression, relative to her job & upcoming surgery , relative to life problems - with children & spouse     Back complaints 2013   has had steroid injection    Depression    DVT (deep venous thrombosis) (HCC) 2001   RLE   Fibroids    Foot fracture, left    HTN (hypertension)    Migraine    h/o migraines - as a teenager  & again now with family issues, states she has to lay down & rest in a dark room  (08/28/2013)   Muscle strain of hand    wearing home made splint today, L hand   Peripheral vascular disease    DVT- per pt. in 2001- had vein removed.    Refusal of blood transfusions as patient is Jehovah's Witness    Thyroid  cancer (HCC)    Thyroid  nodule     Outpatient Encounter Medications as of 11/29/2023  Medication Sig   amLODipine  (NORVASC ) 10 MG tablet Take 1  tablet (10 mg total) by mouth daily.   buPROPion  (WELLBUTRIN  XL) 150 MG 24 hr tablet Take 1 tablet (150 mg total) by mouth daily.   butalbital -acetaminophen -caffeine  (FIORICET) 50-325-40 MG tablet TAKE 1 TO 2 TABLETS BY MOUTH DAILY AS NEEDED   diclofenac  (VOLTAREN ) 75 MG EC tablet Take 1 tablet by mouth twice daily (Patient not taking: Reported on 08/15/2023)   fluticasone  (FLONASE ) 50 MCG/ACT nasal spray Place 2 sprays into both nostrils daily.   gabapentin  (NEURONTIN ) 100 MG capsule Take one capsule in am and 3 capsule at bed time   levothyroxine  (SYNTHROID , LEVOTHROID) 125 MCG tablet Take 125 mcg by mouth daily before breakfast.    loratadine   (CLARITIN ) 10 MG tablet Take 1 tablet (10 mg total) by mouth at bedtime.   omeprazole  (PRILOSEC) 20 MG capsule TAKE 1 CAPSULE(20 MG) BY MOUTH DAILY (Patient not taking: Reported on 08/15/2023)   pravastatin  (PRAVACHOL ) 20 MG tablet TAKE 1 TABLET BY MOUTH ONCE DAILY FOR 5 DAYS PER WEEK   propranolol  (INDERAL ) 10 MG tablet Take 1 tablet (10 mg total) by mouth 2 (two) times daily.   risperiDONE  (RISPERDAL ) 0.5 MG tablet Take 1 tablet (0.5 mg total) by mouth 2 (two) times daily.   Semaglutide -Weight Management (WEGOVY ) 0.25 MG/0.5ML SOAJ Inject 0.25 mg into the skin once a week. Pharmacist to show administration   valsartan  (DIOVAN ) 40 MG tablet Take 1 tablet by mouth once daily   Facility-Administered Encounter Medications as of 11/29/2023  Medication   triamcinolone  acetonide (KENALOG ) 10 MG/ML injection 10 mg    No results found for this or any previous visit (from the past 2160 hours).   Psychiatric Specialty Exam: Physical Exam  Review of Systems  Weight 200 lb (90.7 kg), last menstrual period 01/08/2019.There is no height or weight on file to calculate BMI.  General Appearance: Casual  Eye Contact:  Good  Speech:  Normal Rate  Volume:  Normal  Mood:  Euthymic  Affect:  Appropriate  Thought Process:  Goal Directed  Orientation:  Full (Time, Place, and Person)  Thought Content:  Rumination  Suicidal Thoughts:  No  Homicidal Thoughts:  No  Memory:  Immediate;   Good Recent;   Fair Remote;   Good  Judgement:  Good  Insight:  Present  Psychomotor Activity:  Normal  Concentration:  Concentration: Fair and Attention Span: Fair  Recall:  Good  Fund of Knowledge:  Good  Language:  Good  Akathisia:  No  Handed:  Right  AIMS (if indicated):     Assets:  Communication Skills Desire for Improvement Housing Talents/Skills Transportation  ADL's:  Intact  Cognition:  WNL  Sleep:  ok with Gabapentin         08/15/2023   11:39 AM 04/12/2023    3:44 PM 02/16/2022   12:04 PM  10/16/2021    1:42 PM 04/05/2021    9:46 AM  Depression screen PHQ 2/9  Decreased Interest 0 0 2 0   Down, Depressed, Hopeless 0 0 2 0 1  PHQ - 2 Score 0 0 4 0 1  Altered sleeping 0 0 2    Tired, decreased energy 1 0 2    Change in appetite 0 0 1    Feeling bad or failure about yourself  0 0 0    Trouble concentrating 0 0 0    Moving slowly or fidgety/restless 0 0 0    Suicidal thoughts 0 0 0    PHQ-9 Score 1  0  9  Difficult doing work/chores Not difficult at all Not difficult at all        Data saved with a previous flowsheet row definition    Assessment/Plan: MDD (major depressive disorder), recurrent episode, moderate (HCC) - Plan: buPROPion  (WELLBUTRIN  XL) 150 MG 24 hr tablet, gabapentin  (NEURONTIN ) 100 MG capsule, risperiDONE  (RISPERDAL ) 0.5 MG tablet  GAD (generalized anxiety disorder) - Plan: gabapentin  (NEURONTIN ) 100 MG capsule  Panic attacks - Plan: gabapentin  (NEURONTIN ) 100 MG capsule  Patient is 53 year old female with history of hypertension, chronic back pain, major depressive disorder, generalized anxiety disorder and panic attack.  Discussed current medication.  She is not able to tolerate higher dose of gabapentin .  She like to go back to take gabapentin  100 mg in the morning and 200 mg at bedtime but agree if started to have worsening of anxiety and insomnia then she will try 300 mg gabapentin  at bedtime.  She also like to keep the risperidone  0.5 mg since it is working well and things are under control.  She has no tremor or shakes.  Will continue Wellbutrin  XL 150 mg in the morning, gabapentin  100 mg in the morning and 200 mg at bedtime and risperidone  0.5 mg in the morning.  Recommend to call back if she is any question or any concern.  Follow-up in 3 months.   Follow Up Instructions:     I discussed the assessment and treatment plan with the patient. The patient was provided an opportunity to ask questions and all were answered. The patient agreed with the  plan and demonstrated an understanding of the instructions.   The patient was advised to call back or seek an in-person evaluation if the symptoms worsen or if the condition fails to improve as anticipated.    Collaboration of Care: Other provider involved in patient's care AEB notes are available in epic to review  Patient/Guardian was advised Release of Information must be obtained prior to any record release in order to collaborate their care with an outside provider. Patient/Guardian was advised if they have not already done so to contact the registration department to sign all necessary forms in order for us  to release information regarding their care.   Consent: Patient/Guardian gives verbal consent for treatment and assignment of benefits for services provided during this visit. Patient/Guardian expressed understanding and agreed to proceed.     Total encounter time 14 minutes which includes face-to-face time, chart reviewed, care coordination, order entry and documentation during this encounter.   Note: This document was prepared by [employer] voice dictation technology and any errors that results from this process are unintentional.    Leni ONEIDA Client, MD 11/29/2023

## 2023-12-17 ENCOUNTER — Ambulatory Visit: Admitting: Internal Medicine

## 2023-12-18 ENCOUNTER — Telehealth: Payer: Self-pay | Admitting: Internal Medicine

## 2023-12-18 NOTE — Telephone Encounter (Signed)
 CONFIRMED APPT FOR 12/4

## 2023-12-19 ENCOUNTER — Encounter: Payer: Self-pay | Admitting: Internal Medicine

## 2023-12-19 ENCOUNTER — Ambulatory Visit: Attending: Internal Medicine | Admitting: Internal Medicine

## 2023-12-19 VITALS — BP 105/71 | HR 69 | Ht 66.0 in | Wt 211.0 lb

## 2023-12-19 DIAGNOSIS — K429 Umbilical hernia without obstruction or gangrene: Secondary | ICD-10-CM

## 2023-12-19 DIAGNOSIS — E782 Mixed hyperlipidemia: Secondary | ICD-10-CM

## 2023-12-19 DIAGNOSIS — E66811 Obesity, class 1: Secondary | ICD-10-CM

## 2023-12-19 DIAGNOSIS — I10 Essential (primary) hypertension: Secondary | ICD-10-CM | POA: Diagnosis not present

## 2023-12-19 DIAGNOSIS — E890 Postprocedural hypothyroidism: Secondary | ICD-10-CM

## 2023-12-19 DIAGNOSIS — Z6834 Body mass index (BMI) 34.0-34.9, adult: Secondary | ICD-10-CM

## 2023-12-19 DIAGNOSIS — E6609 Other obesity due to excess calories: Secondary | ICD-10-CM

## 2023-12-19 DIAGNOSIS — Z2821 Immunization not carried out because of patient refusal: Secondary | ICD-10-CM

## 2023-12-19 NOTE — Patient Instructions (Addendum)
 VISIT SUMMARY: During your visit, we discussed your weight gain, hypertension, hyperlipidemia, hypothyroidism, and hernia. We also reviewed your current medications and lifestyle habits. You expressed concerns about affording Wegovy  and managing your weight, and we talked about dietary changes and a referral to a nutritionist. We also addressed your well-controlled blood pressure, cholesterol management, and thyroid  levels. Additionally, we discussed your hernia and the need for a surgical evaluation.  YOUR PLAN: -CLASS 1 OBESITY: Class 1 obesity means having a body mass index (BMI) between 30 and 34.9. You have gained 8 pounds over the last four months. Since your insurance does not cover Wegovy , we discussed alternative ways to manage your weight, including dietary changes and a referral to a nutritionist. You should consider switching to healthier sandwich options like tuna or turkey, reducing sugar intake, substituting chips with fruit or low-fat yogurt, and adding nuts for a crunchy snack. We also talked about a medical weight management program.  -ESSENTIAL HYPERTENSION: Essential hypertension is high blood pressure with no identifiable cause. Your blood pressure is well-controlled with your current medications. Continue taking amlodipine  10 mg daily and valsartan  40 mg daily. Additionally, try to limit your salt intake to help manage your blood pressure.  -MIXED HYPERLIPIDEMIA: Mixed hyperlipidemia is a condition where you have high levels of different types of fats in your blood. Continue taking pravastatin  five days a week to manage your cholesterol levels. We will schedule a blood test to check your cholesterol levels at your next visit in four months.  -POSTSURGICAL HYPOTHYROIDISM: Postsurgical hypothyroidism is an underactive thyroid  gland following surgery. Your thyroid  levels are normal, and you should continue taking levothyroxine  125 mcg daily. Follow up with your endocrinologist in April  to monitor your thyroid  function.  -PERIUMBILICAL HERNIA: A periumbilical hernia is a bulge around your belly button area. Your hernia is increasing in size and causing discomfort, with a risk of bowel obstruction. We referred you to a general surgeon for evaluation and potential repair. Avoid heavy lifting and consider using a support band. Seek emergency care if you experience symptoms of bowel obstruction, such as severe abdominal pain, nausea, vomiting, or inability to pass gas or have a bowel movement.  -GENERAL HEALTH MAINTENANCE: You declined the hepatitis B vaccine series, hepatitis C screening, and HIV screening. This has been documented in your records.  INSTRUCTIONS: Please follow up with the nutritionist for dietary counseling and make an appointment with the general surgeon for your hernia evaluation. Continue your current medications as prescribed and schedule a blood test for cholesterol levels at your next visit in four months. Follow up with your endocrinologist in April to monitor your thyroid  function. If you e xperience any symptoms of bowel obstruction, seek emergency care immediately.   Healthy Eating, Adult Healthy eating may help you get and keep a healthy body weight, reduce the risk of chronic disease, and live a long and productive life. It is important to follow a healthy eating pattern. Your nutritional and calorie needs should be met mainly by different nutrient-rich foods. What are tips for following this plan? Reading food labels Read labels and choose the following: Reduced or low sodium products. Juices with 100% fruit juice. Foods with low saturated fats (<3 g per serving) and high polyunsaturated and monounsaturated fats. Foods with whole grains, such as whole wheat, cracked wheat, brown rice, and wild rice. Whole grains that are fortified with folic acid. This is recommended for females who are pregnant or who want to become pregnant. Read labels  and do not  eat or drink the following: Foods or drinks with added sugars. These include foods that contain brown sugar, corn sweetener, corn syrup, dextrose , fructose, glucose, high-fructose corn syrup, honey, invert sugar, lactose, malt syrup, maltose, molasses, raw sugar, sucrose, trehalose, or turbinado sugar. Limit your intake of added sugars to less than 10% of your total daily calories. Do not eat more than the following amounts of added sugar per day: 6 teaspoons (25 g) for females. 9 teaspoons (38 g) for males. Foods that contain processed or refined starches and grains. Refined grain products, such as white flour, degermed cornmeal, white bread, and white rice. Shopping Choose nutrient-rich snacks, such as vegetables, whole fruits, and nuts. Avoid high-calorie and high-sugar snacks, such as potato chips, fruit snacks, and candy. Use oil-based dressings and spreads on foods instead of solid fats such as butter, margarine, sour cream, or cream cheese. Limit pre-made sauces, mixes, and instant products such as flavored rice, instant noodles, and ready-made pasta. Try more plant-protein sources, such as tofu, tempeh, black beans, edamame, lentils, nuts, and seeds. Explore eating plans such as the Mediterranean diet or vegetarian diet. Try heart-healthy dips made with beans and healthy fats like hummus and guacamole. Vegetables go great with these. Cooking Use oil to saut or stir-fry foods instead of solid fats such as butter, margarine, or lard. Try baking, boiling, grilling, or broiling instead of frying. Remove the fatty part of meats before cooking. Steam vegetables in water  or broth. Meal planning  At meals, imagine dividing your plate into fourths: One-half of your plate is fruits and vegetables. One-fourth of your plate is whole grains. One-fourth of your plate is protein, especially lean meats, poultry, eggs, tofu, beans, or nuts. Include low-fat dairy as part of your daily  diet. Lifestyle Choose healthy options in all settings, including home, work, school, restaurants, or stores. Prepare your food safely: Wash your hands after handling raw meats. Where you prepare food, keep surfaces clean by regularly washing with hot, soapy water . Keep raw meats separate from ready-to-eat foods, such as fruits and vegetables. Cook seafood, meat, poultry, and eggs to the recommended temperature. Get a food thermometer. Store foods at safe temperatures. In general: Keep cold foods at 33F (4.4C) or below. Keep hot foods at 133F (60C) or above. Keep your freezer at [facility] (-17.8C) or below. Foods are not safe to eat if they have been between the temperatures of 40-133F (4.4-60C) for more than 2 hours. What foods should I eat? Fruits Aim to eat 1-2 cups of fresh, canned (in natural juice), or frozen fruits each day. One cup of fruit equals 1 small apple, 1 large banana, 8 large strawberries, 1 cup (237 g) canned fruit,  cup (82 g) dried fruit, or 1 cup (240 mL) 100% juice. Vegetables Aim to eat 2-4 cups of fresh and frozen vegetables each day, including different varieties and colors. One cup of vegetables equals 1 cup (91 g) broccoli or cauliflower florets, 2 medium carrots, 2 cups (150 g) raw, leafy greens, 1 large tomato, 1 large bell pepper, 1 large sweet potato, or 1 medium white potato. Grains Aim to eat 5-10 ounce-equivalents of whole grains each day. Examples of 1 ounce-equivalent of grains include 1 slice of bread, 1 cup (40 g) ready-to-eat cereal, 3 cups (24 g) popcorn, or  cup (93 g) cooked rice. Meats and other proteins Try to eat 5-7 ounce-equivalents of protein each day. Examples of 1 ounce-equivalent of protein include 1 egg,  oz nuts (  12 almonds, 24 pistachios, or 7 walnut halves), 1/4 cup (90 g) cooked beans, 6 tablespoons (90 g) hummus or 1 tablespoon (16 g) peanut butter. A cut of meat or fish that is the size of a deck of cards is about 3-4  ounce-equivalents (85 g). Of the protein you eat each week, try to have at least 8 sounce (227 g) of seafood. This is about 2 servings per week. This includes salmon, trout, herring, sardines, and anchovies. Dairy Aim to eat 3 cup-equivalents of fat-free or low-fat dairy each day. Examples of 1 cup-equivalent of dairy include 1 cup (240 mL) milk, 8 ounces (250 g) yogurt, 1 ounces (44 g) natural cheese, or 1 cup (240 mL) fortified soy milk. Fats and oils Aim for about 5 teaspoons (21 g) of fats and oils per day. Choose monounsaturated fats, such as canola and olive oils, mayonnaise made with olive oil or avocado oil, avocados, peanut butter, and most nuts, or polyunsaturated fats, such as sunflower, corn, and soybean oils, walnuts, pine nuts, sesame seeds, sunflower seeds, and flaxseed. Beverages Aim for 6 eight-ounce glasses of water  per day. Limit coffee to 3-5 eight-ounce cups per day. Limit caffeinated beverages that have added calories, such as soda and energy drinks. If you drink alcohol: Limit how much you have to: 0-1 drink a day if you are female. 0-2 drinks a day if you are female. Know how much alcohol is in your drink. In the [place] one drink is one 12 oz bottle of beer (355 mL), one 5 oz glass of wine (148 mL), or one 1 oz glass of hard liquor (44 mL). Seasoning and other foods Try not to add too much salt to your food. Try using herbs and spices instead of salt. Try not to add sugar to food. This information is based on [place] nutrition guidelines. To learn more, visit disposablenylon.be. Exact amounts may vary. You may need different amounts. This information is not intended to replace advice given to you by your health care provider. Make sure you discuss any questions you have with your health care provider. Document Revised: 10/02/2021 Document Reviewed: 10/02/2021 Elsevier Patient Education  2024 Elsevier Inc.                   Contains text generated by [employer]bridge.                                  Contains text generated by Abridge.

## 2023-12-19 NOTE — Progress Notes (Signed)
 Patient ID: Kara Hall, female    DOB: [birth year]  MRN: [id number]  CC: Hypertension (HTN f/u. [facility] weight loss options - GLP-1/No to flu & pneumonia vax.)   Subjective: Kara Hall is a 53 y.o. female who presents for chronic ds management. Her concerns today include:  Patient with history of HTN, thyroid  cancer (dx age 46, s/p thyroidectomy), anxiety/dep, GERD, lumbar radiculopathy, HL.   Discussed the use of AI scribe software for clinical note transcription with the patient, who gave verbal consent to proceed.  History of Present Illness Kara Hall is a 53 year old female with hypertension, hyperlipidemia, and obesity who presents for a routine follow-up visit.  Obesity: She is unable to afford Wegovy  due to insurance coverage issues. Weight gain from 203 pounds to 211 pounds since her last visit. She is attempting to eat healthier by incorporating fresh fruits but acknowledges large portion sizes and increased hunger due to some psychiatric meds. She is on gabapentin  100 mg, which she believes contributes to weight gain and bloating. Her typical lunch includes leftovers or a ham sandwich with cheese and mayonnaise, and she drinks a small amount of low calorie Hawaiian Punch. She is considering dietary changes to reduce processed deli meats.  She is physically active at work, often reaching her goal of 10,000 steps per day. Her retail job involves significant walking, especially during busy seasons. She has been working extra hours, including Sundays, which is usually her day off.  HTN: She is taking amlodipine  10 mg daily and valsartan  40 mg daily for hypertension. She also takes pravastatin  five days a week for hyperlipidemia and tolerating this dose/frequency.   On levothyroxine  for hypothyroidism. Her thyroid  levels were normal after a follow-up test in May or June with her endocrinologist Dr. Faythe.  She has a history of a hernia that has worsened over  time, causing discomfort, especially when lifting heavy objects or leaning against counters. The hernia began at a previous job 5-6 yrs ago when she worked at the [employer] and did a lot of lifting. It  has become more prominent and painful, and no longer compressible.  HM: declines hep B, flu, PCV vac. Decline HIV/hep C screening    Patient Active Problem List   Diagnosis Date Noted   Moderate major depression (HCC) 06/22/2022   Mixed hyperlipidemia 08/19/2020   Obesity (BMI 30.0-34.9) 08/19/2020   Stressful life event affecting family 08/19/2020   Plantar fasciitis, bilateral 08/19/2020   Status post hysterectomy 12/18/2018   Prolonged Q-T interval on ECG 11/24/2018   Essential hypertension 11/24/2018   Dyspareunia in female 03/24/2018   Increased body mass index 03/24/2018   Carpal tunnel syndrome on both sides 12/10/2017   Achilles tendon contracture, left 10/04/2016   Displaced fracture of fifth metatarsal bone of left foot with delayed healing 05/07/2016   Bilateral wrist pain 11/28/2015   History of thyroid  cancer 08/09/2013   Anxiety 01/12/2013     Current Outpatient Medications on File Prior to Visit  Medication Sig Dispense Refill   amLODipine  (NORVASC ) 10 MG tablet Take 1 tablet (10 mg total) by mouth daily. 90 tablet 2   buPROPion  (WELLBUTRIN  XL) 150 MG 24 hr tablet Take 1 tablet (150 mg total) by mouth daily. 30 tablet 2   butalbital -acetaminophen -caffeine  (FIORICET) 50-325-40 MG tablet TAKE 1 TO 2 TABLETS BY MOUTH DAILY AS NEEDED 15 tablet 1   fluticasone  (FLONASE ) 50 MCG/ACT nasal spray Place 2 sprays into both nostrils daily. 16 g  1   gabapentin  (NEURONTIN ) 100 MG capsule Take one capsule in am and 2 capsule at bed time 90 capsule 2   levothyroxine  (SYNTHROID , LEVOTHROID) 125 MCG tablet Take 125 mcg by mouth daily before breakfast.      loratadine  (CLARITIN ) 10 MG tablet Take 1 tablet (10 mg total) by mouth at bedtime. 90 tablet 0   pravastatin  (PRAVACHOL ) 20 MG  tablet TAKE 1 TABLET BY MOUTH ONCE DAILY FOR 5 DAYS PER WEEK 60 tablet 1   propranolol  (INDERAL ) 10 MG tablet Take 1 tablet (10 mg total) by mouth 2 (two) times daily. 180 tablet 2   risperiDONE  (RISPERDAL ) 0.5 MG tablet Take 1 tablet (0.5 mg total) by mouth daily. 30 tablet 2   valsartan  (DIOVAN ) 40 MG tablet Take 1 tablet by mouth once daily 90 tablet 2   omeprazole  (PRILOSEC) 20 MG capsule TAKE 1 CAPSULE(20 MG) BY MOUTH DAILY (Patient not taking: Reported on 12/19/2023) 90 capsule 1   Current Facility-Administered Medications on File Prior to Visit  Medication Dose Route Frequency Provider Last Rate Last Admin   triamcinolone  acetonide (KENALOG ) 10 MG/ML injection 10 mg  10 mg Other Once Stover, Titorya, DPM        Allergies  Allergen Reactions   Other     BLOOD REFUSAL    Lipitor [Atorvastatin ]     Cramps and headaches   Sulfa Antibiotics Other (See Comments) and Nausea And Vomiting    Severe bladder, and kidney infection   Trazodone And Nefazodone Other (See Comments)    Headache every am     Social History   Socioeconomic History   Marital status: Married    Spouse name: Redell   Number of children: 3   Years of education: 12+   Highest education level: Associate degree: occupational, scientist, product/process development, or vocational program  Occupational History   Occupation: Sales Executive   Occupation: Designer, Fashion/clothing: [employer]  Tobacco Use   Smoking status: Never   Smokeless tobacco: Never  Vaping Use   Vaping status: Never Used  Substance and Sexual Activity   Alcohol use: Yes    Comment: occ   Drug use: No   Sexual activity: Yes    Partners: Male    Birth control/protection: Surgical    Comment: Tubal ligation  Other Topics Concern   Not on file  Social History Narrative   Divorce from previous husband in 06/2016.   Now remarried 07/2016.   Lives at home with her new husband and cat.   She has three grown children who live with her husband. She has a good relationship  with her children.   Her father passed away 2016-05-09.   Social Drivers of Health   Financial Resource Strain: Medium Risk (12/15/2023)   Overall Financial Resource Strain (CARDIA)    Difficulty of Paying Living Expenses: Somewhat hard  Food Insecurity: No Food Insecurity (12/15/2023)   Hunger Vital Sign    Worried About Running Out of Food in the Last Year: Never true    Ran Out of Food in the Last Year: Never true  Transportation Needs: No Transportation Needs (12/15/2023)   PRAPARE - Administrator, Civil Service (Medical): No    Lack of Transportation (Non-Medical): No  Physical Activity: Insufficiently Active (12/15/2023)   Exercise Vital Sign    Days of Exercise per Week: 6 days    Minutes of Exercise per Session: 10 min  Stress: Stress Concern Present (12/15/2023)   [employer]  of Occupational Health - Occupational Stress Questionnaire    Feeling of Stress: To some extent  Social Connections: Unknown (12/15/2023)   Social Connection and Isolation Panel    Frequency of Communication with Friends and Family: More than three times a week    Frequency of Social Gatherings with Friends and Family: Twice a week    Attends Religious Services: Patient declined    Database Administrator or Organizations: Patient declined    Attends Banker Meetings: Not on file    Marital Status: Married  Intimate Partner Violence: Not At Risk (10/26/2022)   Humiliation, Afraid, Rape, and Kick questionnaire    Fear of Current or Ex-Partner: No    Emotionally Abused: No    Physically Abused: No    Sexually Abused: No    Family History  Problem Relation Age of Onset   Heart disease Mother    Hyperlipidemia Mother    Heart disease Father    Hyperlipidemia Father    Diabetes Father    Colon cancer Maternal Grandfather    Breast cancer Cousin 3 - 51   Ovarian cancer Cousin 20 - 29   Heart disease Brother    Hyperlipidemia Brother    Hypertension Brother     Heart disease Brother    Hyperlipidemia Brother    Esophageal cancer Neg Hx    Stomach cancer Neg Hx    Rectal cancer Neg Hx     Past Surgical History:  Procedure Laterality Date   CYSTOSCOPY N/A 12/18/2018   Procedure: CYSTOSCOPY;  Surgeon: Taam-Akelman, Rosalea K, MD;  Location: [facility];  Service: Gynecology;  Laterality: N/A;   LAPAROSCOPIC LYSIS OF ADHESIONS N/A 12/18/2018   Procedure: LAPAROSCOPIC LYSIS OF ADHESIONS;  Surgeon: Bonnielee Rayleen POUR, MD;  Location: [facility];  Service: Gynecology;  Laterality: N/A;   ROBOTIC ASSISTED LAPAROSCOPIC HYSTERECTOMY AND SALPINGECTOMY Bilateral 12/18/2018   Procedure: XI ROBOTIC ASSISTED LAPAROSCOPIC HYSTERECTOMY AND BILATERAL SALPINGECTOMY;  Surgeon: Bonnielee Rayleen POUR, MD;  Location: [facility];  Service: Gynecology;  Laterality: Bilateral;   THROMBECTOMY Right 2001   leg; after 3rd child was born; for deep vein thrombosis   THYROIDECTOMY Right 08/28/2013   Procedure: RIGHT THYROID  LOBECTOMY POSSIBLE TOTAL THYROIDECTOMY;  Surgeon: Marlyce Finer, MD;  Location: [facility];  Service: ENT;  Laterality: Right;   THYROIDECTOMY Left 09/10/2013   Procedure: COMPLETE LEFT THYROIDECTOMY;  Surgeon: Marlyce Finer, MD;  Location: [facility] OR;  Service: ENT;  Laterality: Left;   TUBAL LIGATION  2001    ROS: Review of Systems Negative except as stated above  PHYSICAL EXAM: BP 105/71 (BP Location: Left Arm, Patient Position: Sitting)   Pulse 69   Ht 5' 6 (1.676 m)   Wt 211 lb (95.7 kg)   LMP 01/08/2019   SpO2 96%   BMI 34.06 kg/m   Wt Readings from Last 3 Encounters:  12/19/23 211 lb (95.7 kg)  08/15/23 203 lb (92.1 kg)  04/12/23 205 lb (93 kg)    Physical Exam  General appearance - alert, well appearing, middle age caucasian female and in no distress Mental status - normal mood, behavior, speech, dress, motor activity, and thought processes Neck - supple, no significant adenopathy Chest -  clear to auscultation, no wheezes, rales or rhonchi, symmetric air entry Heart - normal rate, regular rhythm, normal S1, S2, no murmurs, rubs, clicks or gallops Abdomen - soft, nontender, non-distended. +4x 3 cm firm, non-compressible hernia below umbilicus more on RT side  Extremities - peripheral pulses normal, no pedal edema, no clubbing or cyanosis     08/15/2023   11:39 AM 04/12/2023    3:44 PM 02/16/2022   12:04 PM  Depression screen PHQ 2/9  Decreased Interest 0 0 2  Down, Depressed, Hopeless 0 0 2  PHQ - 2 Score 0 0 4  Altered sleeping 0 0 2  Tired, decreased energy 1 0 2  Change in appetite 0 0 1  Feeling bad or failure about yourself  0 0 0  Trouble concentrating 0 0 0  Moving slowly or fidgety/restless 0 0 0  Suicidal thoughts 0 0 0  PHQ-9 Score 1  0  9   Difficult doing work/chores Not difficult at all Not difficult at all      Data saved with a previous flowsheet row definition        Latest Ref Rng & Units 04/12/2023    3:45 PM 02/16/2022   12:15 PM 08/31/2020    3:02 PM  CMP  Glucose 70 - 99 mg/dL 898  899  91   BUN 6 - 24 mg/dL 12  16  14    Creatinine 0.57 - 1.00 mg/dL 9.40  9.33  9.45   Sodium 134 - 144 mmol/L 139  140  139   Potassium 3.5 - 5.2 mmol/L 4.1  4.5  3.9   Chloride 96 - 106 mmol/L 101  102  103   CO2 20 - 29 mmol/L 22  23  20    Calcium  8.7 - 10.2 mg/dL 8.9  8.7  8.2   Total Protein 6.0 - 8.5 g/dL 7.2  6.9    Total Bilirubin 0.0 - 1.2 mg/dL 0.5  0.4    Alkaline Phos 44 - 121 IU/L 83  70    AST 0 - 40 IU/L 24  13    ALT 0 - 32 IU/L 36  14     Lipid Panel     Component Value Date/Time   CHOL 272 (H) 04/12/2023 1545   TRIG 101 04/12/2023 1545   HDL 64 04/12/2023 1545   CHOLHDL 4.3 04/12/2023 1545   CHOLHDL 2.7 11/23/2014 1554   VLDL 11 11/23/2014 1554   LDLCALC 191 (H) 04/12/2023 1545    CBC    Component Value Date/Time   WBC 6.5 04/12/2023 1545   WBC 13.9 (H) 12/19/2018 0812   RBC 4.33 04/12/2023 1545   RBC 3.55 (L) 12/19/2018 0812    HGB 13.9 04/12/2023 1545   HCT 40.2 04/12/2023 1545   PLT 291 04/12/2023 1545   MCV 93 04/12/2023 1545   MCH 32.1 04/12/2023 1545   MCH 28.2 12/19/2018 0812   MCHC 34.6 04/12/2023 1545   MCHC 30.6 12/19/2018 0812   RDW 12.4 04/12/2023 1545   LYMPHSABS 1.5 05/19/2020 0926   MONOABS 0.5 11/23/2014 1554   EOSABS 0.2 05/19/2020 0926   BASOSABS 0.1 05/19/2020 0926    ASSESSMENT AND PLAN: 1. Class 1 obesity due to excess calories with serious comorbidity and body mass index (BMI) of 34.0 to 34.9 in adult (Primary) Weight gain of 8 pounds over four months. Insurance does not cover Wegovy  that was prescribed on last visit.  - Advised dietary modifications: switch to tuna or turkey sandwiches, reduce sugar, substitute chips with fruit or low-fat yogurt, consider nuts for crunch. Cut back on portion sizes. - Discussed medical weight management program or referral to nutritionist.  Patient prefers to see the nutritionist.  Continue to move as much as she  can.. - Amb ref to Medical Nutrition Therapy-MNT  2. Essential hypertension At goal.  Continue Norvasc  10 mg daily and valsartan  40 mg daily  3. Mixed hyperlipidemia Continue pravastatin  20 mg 5 days a week.  4. Postsurgical hypothyroidism Patient on levothyroxine  and reports normal TSH level this past summer.  5. Periumbilical hernia Went over signs and symptoms that would suggest bowel blockage/ischemia which would require her to be seen emergently.  In the meantime, I advise to avoid heavy lifting.  She is agreeable to referral to general surgeon for elective repair. - Ambulatory referral to General Surgery  6. Influenza vaccination declined  7. Pneumococcal vaccination declined   Patient was given the opportunity to ask questions.  Patient verbalized understanding of the plan and was able to repeat key elements of the plan.   This documentation was completed using Paediatric nurse.  Any transcriptional errors are  unintentional.  Orders Placed This Encounter  Procedures   Amb ref to Medical Nutrition Therapy-MNT   Ambulatory referral to General Surgery     Requested Prescriptions    No prescriptions requested or ordered in this encounter    Return in about 4 months (around 04/18/2024).  Barnie Louder, MD, FACP

## 2024-01-19 ENCOUNTER — Other Ambulatory Visit: Payer: Self-pay | Admitting: Internal Medicine

## 2024-01-19 DIAGNOSIS — R0982 Postnasal drip: Secondary | ICD-10-CM

## 2024-02-20 ENCOUNTER — Other Ambulatory Visit (HOSPITAL_COMMUNITY): Payer: Self-pay | Admitting: Psychiatry

## 2024-02-20 DIAGNOSIS — F331 Major depressive disorder, recurrent, moderate: Secondary | ICD-10-CM

## 2024-02-28 ENCOUNTER — Telehealth (HOSPITAL_COMMUNITY): Admitting: Psychiatry

## 2024-04-23 ENCOUNTER — Ambulatory Visit: Admitting: Internal Medicine
